# Patient Record
Sex: Female | Born: 1940 | Race: White | Hispanic: No | Marital: Single | Smoking: Never smoker
Health system: Southern US, Community
[De-identification: ages and names within clinical notes are randomized; demographics above are authoritative.]

## PROBLEM LIST (undated history)

## (undated) DIAGNOSIS — E669 Obesity, unspecified: Secondary | ICD-10-CM

## (undated) DIAGNOSIS — E079 Disorder of thyroid, unspecified: Secondary | ICD-10-CM

## (undated) DIAGNOSIS — I480 Paroxysmal atrial fibrillation: Secondary | ICD-10-CM

## (undated) DIAGNOSIS — K3189 Other diseases of stomach and duodenum: Secondary | ICD-10-CM

## (undated) DIAGNOSIS — N390 Urinary tract infection, site not specified: Secondary | ICD-10-CM

## (undated) DIAGNOSIS — K649 Unspecified hemorrhoids: Secondary | ICD-10-CM

## (undated) DIAGNOSIS — K219 Gastro-esophageal reflux disease without esophagitis: Secondary | ICD-10-CM

## (undated) DIAGNOSIS — Z5189 Encounter for other specified aftercare: Secondary | ICD-10-CM

## (undated) DIAGNOSIS — I8500 Esophageal varices without bleeding: Secondary | ICD-10-CM

## (undated) DIAGNOSIS — B029 Zoster without complications: Secondary | ICD-10-CM

## (undated) DIAGNOSIS — K7460 Unspecified cirrhosis of liver: Secondary | ICD-10-CM

## (undated) DIAGNOSIS — Z86010 Personal history of colonic polyps: Secondary | ICD-10-CM

## (undated) DIAGNOSIS — I499 Cardiac arrhythmia, unspecified: Secondary | ICD-10-CM

## (undated) DIAGNOSIS — I10 Essential (primary) hypertension: Secondary | ICD-10-CM

## (undated) DIAGNOSIS — K5790 Diverticulosis of intestine, part unspecified, without perforation or abscess without bleeding: Secondary | ICD-10-CM

## (undated) DIAGNOSIS — D649 Anemia, unspecified: Secondary | ICD-10-CM

## (undated) DIAGNOSIS — IMO0002 Reserved for concepts with insufficient information to code with codable children: Secondary | ICD-10-CM

## (undated) DIAGNOSIS — E039 Hypothyroidism, unspecified: Secondary | ICD-10-CM

## (undated) DIAGNOSIS — K56600 Partial intestinal obstruction, unspecified as to cause: Secondary | ICD-10-CM

## (undated) DIAGNOSIS — Z87442 Personal history of urinary calculi: Secondary | ICD-10-CM

## (undated) DIAGNOSIS — K766 Portal hypertension: Secondary | ICD-10-CM

## (undated) HISTORY — PX: COLON SURGERY: SHX602

## (undated) HISTORY — DX: Portal hypertension: K76.6

## (undated) HISTORY — DX: Unspecified cirrhosis of liver: K74.60

## (undated) HISTORY — PX: COLONOSCOPY: SHX174

## (undated) HISTORY — PX: ESOPHAGOGASTRODUODENOSCOPY: SHX1529

## (undated) HISTORY — DX: Urinary tract infection, site not specified: N39.0

## (undated) HISTORY — PX: CYSTOSCOPY: SUR368

## (undated) HISTORY — DX: Partial intestinal obstruction, unspecified as to cause: K56.600

## (undated) HISTORY — DX: Esophageal varices without bleeding: I85.00

## (undated) HISTORY — DX: Essential (primary) hypertension: I10

## (undated) HISTORY — DX: Zoster without complications: B02.9

## (undated) HISTORY — DX: Encounter for other specified aftercare: Z51.89

## (undated) HISTORY — DX: Disorder of thyroid, unspecified: E07.9

## (undated) HISTORY — DX: Obesity, unspecified: E66.9

## (undated) HISTORY — DX: Other diseases of stomach and duodenum: K31.89

## (undated) HISTORY — DX: Diverticulosis of intestine, part unspecified, without perforation or abscess without bleeding: K57.90

## (undated) HISTORY — DX: Paroxysmal atrial fibrillation: I48.0

## (undated) HISTORY — DX: Personal history of colonic polyps: Z86.010

## (undated) HISTORY — DX: Reserved for concepts with insufficient information to code with codable children: IMO0002

## (undated) HISTORY — PX: ABDOMINAL HYSTERECTOMY: SHX81

## (undated) HISTORY — DX: Anemia, unspecified: D64.9

## (undated) HISTORY — DX: Unspecified hemorrhoids: K64.9

---

## 1989-06-09 HISTORY — PX: APPENDECTOMY: SHX54

## 1998-06-09 HISTORY — PX: CHOLECYSTECTOMY: SHX55

## 1998-08-09 ENCOUNTER — Ambulatory Visit (HOSPITAL_COMMUNITY): Admission: RE | Admit: 1998-08-09 | Discharge: 1998-08-09 | Payer: Self-pay | Admitting: *Deleted

## 1998-08-13 ENCOUNTER — Ambulatory Visit (HOSPITAL_COMMUNITY): Admission: RE | Admit: 1998-08-13 | Discharge: 1998-08-13 | Payer: Self-pay | Admitting: *Deleted

## 1998-08-13 ENCOUNTER — Encounter: Payer: Self-pay | Admitting: *Deleted

## 1999-05-10 ENCOUNTER — Encounter (HOSPITAL_BASED_OUTPATIENT_CLINIC_OR_DEPARTMENT_OTHER): Payer: Self-pay | Admitting: General Surgery

## 1999-05-13 ENCOUNTER — Encounter (HOSPITAL_BASED_OUTPATIENT_CLINIC_OR_DEPARTMENT_OTHER): Payer: Self-pay | Admitting: General Surgery

## 1999-05-13 ENCOUNTER — Encounter (INDEPENDENT_AMBULATORY_CARE_PROVIDER_SITE_OTHER): Payer: Self-pay | Admitting: Specialist

## 1999-05-14 ENCOUNTER — Inpatient Hospital Stay (HOSPITAL_COMMUNITY): Admission: RE | Admit: 1999-05-14 | Discharge: 1999-05-31 | Payer: Self-pay | Admitting: General Surgery

## 1999-05-14 ENCOUNTER — Encounter (HOSPITAL_BASED_OUTPATIENT_CLINIC_OR_DEPARTMENT_OTHER): Payer: Self-pay | Admitting: General Surgery

## 1999-05-15 ENCOUNTER — Encounter (HOSPITAL_BASED_OUTPATIENT_CLINIC_OR_DEPARTMENT_OTHER): Payer: Self-pay | Admitting: General Surgery

## 1999-05-16 ENCOUNTER — Encounter (HOSPITAL_BASED_OUTPATIENT_CLINIC_OR_DEPARTMENT_OTHER): Payer: Self-pay | Admitting: General Surgery

## 1999-05-20 ENCOUNTER — Encounter (HOSPITAL_BASED_OUTPATIENT_CLINIC_OR_DEPARTMENT_OTHER): Payer: Self-pay | Admitting: General Surgery

## 1999-05-21 ENCOUNTER — Encounter: Payer: Self-pay | Admitting: Critical Care Medicine

## 1999-05-22 ENCOUNTER — Encounter (HOSPITAL_BASED_OUTPATIENT_CLINIC_OR_DEPARTMENT_OTHER): Payer: Self-pay | Admitting: General Surgery

## 1999-05-23 ENCOUNTER — Encounter (HOSPITAL_BASED_OUTPATIENT_CLINIC_OR_DEPARTMENT_OTHER): Payer: Self-pay | Admitting: General Surgery

## 1999-05-24 ENCOUNTER — Encounter (HOSPITAL_BASED_OUTPATIENT_CLINIC_OR_DEPARTMENT_OTHER): Payer: Self-pay | Admitting: General Surgery

## 1999-05-25 ENCOUNTER — Encounter (HOSPITAL_BASED_OUTPATIENT_CLINIC_OR_DEPARTMENT_OTHER): Payer: Self-pay | Admitting: General Surgery

## 1999-05-26 ENCOUNTER — Encounter (HOSPITAL_BASED_OUTPATIENT_CLINIC_OR_DEPARTMENT_OTHER): Payer: Self-pay | Admitting: General Surgery

## 1999-05-27 ENCOUNTER — Encounter: Payer: Self-pay | Admitting: Thoracic Surgery

## 1999-05-27 ENCOUNTER — Encounter (HOSPITAL_BASED_OUTPATIENT_CLINIC_OR_DEPARTMENT_OTHER): Payer: Self-pay | Admitting: General Surgery

## 1999-05-30 ENCOUNTER — Encounter (HOSPITAL_BASED_OUTPATIENT_CLINIC_OR_DEPARTMENT_OTHER): Payer: Self-pay | Admitting: General Surgery

## 1999-05-31 ENCOUNTER — Inpatient Hospital Stay: Admission: RE | Admit: 1999-05-31 | Discharge: 1999-06-18 | Payer: Self-pay | Admitting: General Surgery

## 1999-06-06 ENCOUNTER — Encounter (HOSPITAL_BASED_OUTPATIENT_CLINIC_OR_DEPARTMENT_OTHER): Payer: Self-pay | Admitting: General Surgery

## 1999-06-08 ENCOUNTER — Encounter (HOSPITAL_BASED_OUTPATIENT_CLINIC_OR_DEPARTMENT_OTHER): Payer: Self-pay | Admitting: General Surgery

## 1999-06-10 ENCOUNTER — Encounter (HOSPITAL_BASED_OUTPATIENT_CLINIC_OR_DEPARTMENT_OTHER): Payer: Self-pay | Admitting: General Surgery

## 1999-06-13 ENCOUNTER — Encounter (HOSPITAL_BASED_OUTPATIENT_CLINIC_OR_DEPARTMENT_OTHER): Payer: Self-pay | Admitting: General Surgery

## 1999-06-17 ENCOUNTER — Encounter (HOSPITAL_BASED_OUTPATIENT_CLINIC_OR_DEPARTMENT_OTHER): Payer: Self-pay | Admitting: General Surgery

## 1999-06-18 ENCOUNTER — Inpatient Hospital Stay (HOSPITAL_COMMUNITY): Admission: EM | Admit: 1999-06-18 | Discharge: 1999-07-26 | Payer: Self-pay | Admitting: General Surgery

## 1999-06-19 ENCOUNTER — Encounter: Payer: Self-pay | Admitting: Nephrology

## 1999-06-19 ENCOUNTER — Encounter (HOSPITAL_BASED_OUTPATIENT_CLINIC_OR_DEPARTMENT_OTHER): Payer: Self-pay | Admitting: General Surgery

## 1999-06-22 ENCOUNTER — Encounter (HOSPITAL_BASED_OUTPATIENT_CLINIC_OR_DEPARTMENT_OTHER): Payer: Self-pay | Admitting: General Surgery

## 1999-06-30 ENCOUNTER — Encounter (HOSPITAL_BASED_OUTPATIENT_CLINIC_OR_DEPARTMENT_OTHER): Payer: Self-pay | Admitting: General Surgery

## 1999-07-01 ENCOUNTER — Encounter (HOSPITAL_BASED_OUTPATIENT_CLINIC_OR_DEPARTMENT_OTHER): Payer: Self-pay | Admitting: General Surgery

## 1999-07-04 ENCOUNTER — Encounter (HOSPITAL_BASED_OUTPATIENT_CLINIC_OR_DEPARTMENT_OTHER): Payer: Self-pay | Admitting: General Surgery

## 1999-07-06 ENCOUNTER — Encounter (HOSPITAL_BASED_OUTPATIENT_CLINIC_OR_DEPARTMENT_OTHER): Payer: Self-pay | Admitting: General Surgery

## 1999-07-10 ENCOUNTER — Encounter: Payer: Self-pay | Admitting: Nephrology

## 1999-07-23 ENCOUNTER — Encounter (HOSPITAL_BASED_OUTPATIENT_CLINIC_OR_DEPARTMENT_OTHER): Payer: Self-pay | Admitting: General Surgery

## 1999-07-26 ENCOUNTER — Inpatient Hospital Stay: Admission: RE | Admit: 1999-07-26 | Discharge: 1999-08-09 | Payer: Self-pay | Admitting: General Surgery

## 1999-08-02 ENCOUNTER — Ambulatory Visit (HOSPITAL_COMMUNITY): Admission: RE | Admit: 1999-08-02 | Discharge: 1999-08-02 | Payer: Self-pay | Admitting: General Surgery

## 1999-08-02 ENCOUNTER — Encounter (HOSPITAL_BASED_OUTPATIENT_CLINIC_OR_DEPARTMENT_OTHER): Payer: Self-pay | Admitting: General Surgery

## 1999-08-06 ENCOUNTER — Encounter (HOSPITAL_BASED_OUTPATIENT_CLINIC_OR_DEPARTMENT_OTHER): Payer: Self-pay | Admitting: General Surgery

## 1999-08-06 ENCOUNTER — Ambulatory Visit (HOSPITAL_COMMUNITY): Admission: RE | Admit: 1999-08-06 | Discharge: 1999-08-06 | Payer: Self-pay | Admitting: General Surgery

## 1999-08-07 ENCOUNTER — Encounter (HOSPITAL_BASED_OUTPATIENT_CLINIC_OR_DEPARTMENT_OTHER): Payer: Self-pay | Admitting: General Surgery

## 1999-08-07 ENCOUNTER — Ambulatory Visit (HOSPITAL_COMMUNITY): Admission: RE | Admit: 1999-08-07 | Discharge: 1999-08-07 | Payer: Self-pay | Admitting: General Surgery

## 1999-08-09 ENCOUNTER — Inpatient Hospital Stay (HOSPITAL_COMMUNITY): Admission: EM | Admit: 1999-08-09 | Discharge: 1999-08-13 | Payer: Self-pay | Admitting: Cardiovascular Disease

## 1999-08-13 ENCOUNTER — Inpatient Hospital Stay: Admission: RE | Admit: 1999-08-13 | Discharge: 1999-09-14 | Payer: Self-pay | Admitting: General Surgery

## 1999-08-15 ENCOUNTER — Ambulatory Visit (HOSPITAL_COMMUNITY): Admission: RE | Admit: 1999-08-15 | Discharge: 1999-08-15 | Payer: Self-pay | Admitting: General Surgery

## 1999-08-19 ENCOUNTER — Encounter (HOSPITAL_BASED_OUTPATIENT_CLINIC_OR_DEPARTMENT_OTHER): Payer: Self-pay | Admitting: General Surgery

## 1999-08-19 ENCOUNTER — Ambulatory Visit (HOSPITAL_COMMUNITY): Admission: RE | Admit: 1999-08-19 | Discharge: 1999-08-19 | Payer: Self-pay | Admitting: General Surgery

## 1999-08-20 ENCOUNTER — Encounter: Payer: Self-pay | Admitting: *Deleted

## 1999-08-20 ENCOUNTER — Ambulatory Visit (HOSPITAL_COMMUNITY): Admission: RE | Admit: 1999-08-20 | Discharge: 1999-08-20 | Payer: Self-pay | Admitting: General Surgery

## 1999-09-02 ENCOUNTER — Encounter (HOSPITAL_BASED_OUTPATIENT_CLINIC_OR_DEPARTMENT_OTHER): Payer: Self-pay | Admitting: General Surgery

## 1999-09-02 ENCOUNTER — Ambulatory Visit (HOSPITAL_COMMUNITY): Admission: RE | Admit: 1999-09-02 | Discharge: 1999-09-02 | Payer: Self-pay | Admitting: General Surgery

## 2000-11-16 ENCOUNTER — Ambulatory Visit (HOSPITAL_COMMUNITY): Admission: RE | Admit: 2000-11-16 | Discharge: 2000-11-16 | Payer: Self-pay | Admitting: Internal Medicine

## 2000-11-16 ENCOUNTER — Encounter: Payer: Self-pay | Admitting: Internal Medicine

## 2001-04-26 ENCOUNTER — Other Ambulatory Visit: Admission: RE | Admit: 2001-04-26 | Discharge: 2001-04-26 | Payer: Self-pay | Admitting: Obstetrics and Gynecology

## 2003-02-24 ENCOUNTER — Ambulatory Visit (HOSPITAL_BASED_OUTPATIENT_CLINIC_OR_DEPARTMENT_OTHER): Admission: RE | Admit: 2003-02-24 | Discharge: 2003-02-24 | Payer: Self-pay | Admitting: Urology

## 2003-02-24 ENCOUNTER — Ambulatory Visit (HOSPITAL_COMMUNITY): Admission: RE | Admit: 2003-02-24 | Discharge: 2003-02-24 | Payer: Self-pay | Admitting: Urology

## 2004-08-08 ENCOUNTER — Ambulatory Visit: Payer: Self-pay | Admitting: Internal Medicine

## 2004-08-12 ENCOUNTER — Ambulatory Visit (HOSPITAL_COMMUNITY): Admission: RE | Admit: 2004-08-12 | Discharge: 2004-08-12 | Payer: Self-pay | Admitting: Internal Medicine

## 2004-08-12 ENCOUNTER — Ambulatory Visit: Payer: Self-pay | Admitting: Internal Medicine

## 2004-09-16 ENCOUNTER — Ambulatory Visit: Payer: Self-pay | Admitting: Internal Medicine

## 2004-10-07 ENCOUNTER — Ambulatory Visit: Payer: Self-pay | Admitting: Internal Medicine

## 2004-10-14 ENCOUNTER — Ambulatory Visit: Payer: Self-pay | Admitting: Internal Medicine

## 2004-10-21 ENCOUNTER — Ambulatory Visit: Payer: Self-pay | Admitting: Internal Medicine

## 2006-06-26 ENCOUNTER — Other Ambulatory Visit: Admission: RE | Admit: 2006-06-26 | Discharge: 2006-06-26 | Payer: Self-pay | Admitting: Internal Medicine

## 2007-12-08 ENCOUNTER — Inpatient Hospital Stay (HOSPITAL_COMMUNITY): Admission: EM | Admit: 2007-12-08 | Discharge: 2007-12-13 | Payer: Self-pay | Admitting: Emergency Medicine

## 2007-12-09 ENCOUNTER — Encounter: Payer: Self-pay | Admitting: Internal Medicine

## 2007-12-13 ENCOUNTER — Encounter: Payer: Self-pay | Admitting: Gastroenterology

## 2007-12-15 ENCOUNTER — Ambulatory Visit: Payer: Self-pay | Admitting: Internal Medicine

## 2007-12-31 ENCOUNTER — Ambulatory Visit: Payer: Self-pay | Admitting: Internal Medicine

## 2007-12-31 DIAGNOSIS — K7460 Unspecified cirrhosis of liver: Secondary | ICD-10-CM | POA: Insufficient documentation

## 2007-12-31 DIAGNOSIS — Z8719 Personal history of other diseases of the digestive system: Secondary | ICD-10-CM

## 2007-12-31 DIAGNOSIS — K766 Portal hypertension: Secondary | ICD-10-CM | POA: Insufficient documentation

## 2008-01-12 ENCOUNTER — Ambulatory Visit: Payer: Self-pay | Admitting: Internal Medicine

## 2009-02-07 DEATH — deceased

## 2009-08-21 ENCOUNTER — Encounter: Payer: Self-pay | Admitting: Internal Medicine

## 2009-08-29 ENCOUNTER — Ambulatory Visit: Payer: Self-pay | Admitting: Internal Medicine

## 2009-08-29 ENCOUNTER — Ambulatory Visit (HOSPITAL_COMMUNITY): Admission: RE | Admit: 2009-08-29 | Discharge: 2009-08-29 | Payer: Self-pay | Admitting: Internal Medicine

## 2009-09-27 ENCOUNTER — Ambulatory Visit: Payer: Self-pay | Admitting: Internal Medicine

## 2009-09-27 DIAGNOSIS — E669 Obesity, unspecified: Secondary | ICD-10-CM | POA: Insufficient documentation

## 2009-10-01 ENCOUNTER — Ambulatory Visit (HOSPITAL_COMMUNITY): Admission: RE | Admit: 2009-10-01 | Discharge: 2009-10-01 | Payer: Self-pay | Admitting: Internal Medicine

## 2010-01-07 ENCOUNTER — Encounter (INDEPENDENT_AMBULATORY_CARE_PROVIDER_SITE_OTHER): Payer: Self-pay | Admitting: *Deleted

## 2010-02-07 ENCOUNTER — Encounter (INDEPENDENT_AMBULATORY_CARE_PROVIDER_SITE_OTHER): Payer: Self-pay | Admitting: *Deleted

## 2010-03-18 ENCOUNTER — Encounter (INDEPENDENT_AMBULATORY_CARE_PROVIDER_SITE_OTHER): Payer: Self-pay

## 2010-03-19 ENCOUNTER — Ambulatory Visit: Payer: Self-pay | Admitting: Internal Medicine

## 2010-04-02 ENCOUNTER — Ambulatory Visit: Payer: Self-pay | Admitting: Internal Medicine

## 2010-07-11 NOTE — Letter (Signed)
Summary: Pre Visit Letter Revised  [facility]  [address] [place], [place] [address]   Phone: [phone]  Fax: [phone]        02/07/2010 MRN: [id number] [facility] [address] [place], [place]  [address]             Procedure Date:  04/02/2010   Welcome to the Gastroenterology Division at [facility].    You are scheduled to see a nurse for your pre-procedure visit on 03/19/2010 at 1:00PM on the 3rd floor at [facility], [address]  We ask that you try to arrive at our office 15 minutes prior to your appointment time to allow for check-in.  Please take a minute to review the attached form.  If you answer "Yes" to one or more of the questions on the first page, we ask that you call the person listed at your earliest opportunity.  If you answer "No" to all of the questions, please complete the rest of the form and bring it to your appointment.    Your nurse visit will consist of discussing your medical and surgical history, your immediate family medical history, and your medications.   If you are unable to list all of your medications on the form, please bring the medication bottles to your appointment and we will list them.  We will need to be aware of both prescribed and over the counter drugs.  We will need to know exact dosage information as well.    Please be prepared to read and sign documents such as consent forms, a financial agreement, and acknowledgement forms.  If necessary, and with your consent, a friend or relative is welcome to sit-in on the nurse visit with you.  Please bring your insurance card so that we may make a copy of it.  If your insurance requires a referral to see a specialist, please bring your referral form from your primary care physician.  No co-pay is required for this nurse visit.     If you cannot keep your appointment, please call [phone] to cancel or reschedule prior to your appointment date.  This  allows [place] the opportunity to schedule an appointment for another patient in need of care.    Thank you for choosing [facility] for your medical needs.  We appreciate the opportunity to care for you.  Please visit [place] at our website  to learn more about our practice.  Sincerely, The Gastroenterology Division

## 2010-07-11 NOTE — Consult Note (Signed)
Summary: GI Consult/[facility]  GI Consult/[facility]   Imported By: Sherian Rein 10/15/2009 07:40:08  _____________________________________________________________________  External Attachment:    Type:   Image     Comment:   External Document

## 2010-07-11 NOTE — Progress Notes (Signed)
Summary: [facility], [facility]   Imported By: Sherian Rein 10/15/2009 07:42:12  _____________________________________________________________________  External Attachment:    Type:   Image     Comment:   External Document

## 2010-07-11 NOTE — Procedures (Signed)
Summary: Upper Endoscopy  Patient: Brittney Valentine Note: All result statuses are Final unless otherwise noted.  Tests: (1) Upper Endoscopy (EGD)   EGD Upper Endoscopy       DONE (C)     [facility]     [address]     [place], [place]  [address]           ENDOSCOPY PROCEDURE REPORT           PATIENT:  Brittney Valentine, Brittney Valentine  MR#:  [id number]     BIRTHDATE:  02-04-1941, 68 yrs. old  GENDER:  female           ENDOSCOPIST:  Iva Boop, MD, [facility]           PROCEDURE DATE:  04/02/2010     PROCEDURE:  EGD, diagnostic [phone]     ASA CLASS:  Class III     INDICATIONS:  cirrhosis, Known esophageal varices, for     surveillance exam.           MEDICATIONS:   Fentanyl 50 mcg IV, Versed 5 mg IV     TOPICAL ANESTHETIC:  Exactacain Spray           DESCRIPTION OF PROCEDURE:   After the risks benefits and     alternatives of the procedure were thoroughly explained, informed     consent was obtained.  The LB GIF-H180 [id number] endoscope was     introduced through the mouth and advanced to the second portion of     the duodenum, without limitations.  The instrument was slowly     withdrawn as the mucosa was fully examined.     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 Into distal     esophagus, 30-40 cm. 3 columns. No bleeding stigmata.  Portal     Hypertensive gastropathy in the total stomach. Some friable mucosa     with contact bleeding also.  Otherwise the examination was normal.     Retroflexed views revealed Retroflexion exam demonstrated findings     as previously described.    The scope was then withdrawn from the     patient and the procedure completed.           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     In 2 years for  EGD           Iva Boop, MD, [facility]           CC:  Marisue Brooklyn, DO     The Patient           n.     REVISED:  04/02/2010 01:11 PM     eSIGNED:   Iva Boop at 04/02/2010 01:11 PM           Signe Colt, [id number]  Note: An exclamation mark (!) indicates a result that was not dispersed into the flowsheet. Document Creation Date: 04/02/2010 1:11 PM _______________________________________________________________________  (1) Order result status: Final Collection or observation date-time: 04/02/2010 12:59 Requested date-time:  Receipt date-time:  Reported date-time:  Referring Physician:   Ordering Physician: Stan Head [id number]) Specimen Source:  Source: Launa Grill Order Number: [id number] Lab site:   Appended Document: Upper Endoscopy    Clinical Lists Changes  Observations: Added new observation of EGD DUE: 03/2012 (04/02/2010 14:53)

## 2010-07-11 NOTE — Assessment & Plan Note (Signed)
Summary: Cirrhosis follow-up   History of Present Illness Visit Type: Follow-up Visit Primary GI MD: Stan Head MD [facility] Primary Provider: Marisue Brooklyn, M.D. Requesting Provider: n/a Chief Complaint: f/u SBO and cirrhosis. Denies any GI sx at this time. 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 Sghe says her sister's liver enzymes are up and she is getting  a GFI consult.. Had to see Dr. Elisabeth Most re: allergies yesterday. Missed 8/10 appointment and is now following up. Labs with Dr. Elisabeth Most last month "ok". Otherwise ok.   GI Review of Systems      Denies abdominal pain, acid reflux, belching, bloating, chest pain, dysphagia with liquids, dysphagia with solids, heartburn, loss of appetite, nausea, vomiting, vomiting blood, weight loss, and  weight gain.      Reports liver problems.     Denies anal fissure, black tarry stools, change in bowel habit, constipation, diarrhea, diverticulosis, fecal incontinence, heme positive stool, hemorrhoids, irritable bowel syndrome, jaundice, light color stool, rectal bleeding, and  rectal pain.    Current Medications (verified): 1)  Levothyroxine Sodium 112 Mcg  Tabs (Levothyroxine Sodium) .Marland Kitchen.. 1 Tablet By Mouth Once Daily 2)  Flecainide Acetate 100 Mg  Tabs (Flecainide Acetate) .Marland Kitchen.. 1 Tablet By Mouth Two Times A Day 3)  Ativan 0.5 Mg  Tabs (Lorazepam) .Marland Kitchen.. 1-2 Every 6 Hrs As Needed Anxiety 4)  Vitamin D 1000 Unit  Tabs (Cholecalciferol) .... 2 Tablets By Mouth Once Daily  Allergies (verified): 1)  ! Sulfa  Past History:  Past Medical History: Reviewed history from 12/31/2007 and no changes required. Cirrhosis cryptogenic SVT PAF Ureterolithiasis Arrhythmia Atrial Fibrillation Hypothyroidism Small bowel obstruction  Past Surgical History: Reviewed history from 12/31/2007 and no changes required. Appendectomy Cholecystectomy complicated by small bowel perf Small bowel  resection Hysterectomy  Family History: Reviewed history from 12/31/2007 and no changes required. No FH of Colon Cancer:  Social History: Reviewed history from 12/31/2007 and no changes required. Patient has never smoked.  Alcohol Use - no Daily Caffeine Use 1 cup per week  Vital Signs:  Patient profile:   70 year old female Height:      65 inches Weight:      225.50 pounds BMI:     37.66 Pulse rate:   70 / minute Pulse rhythm:   regular BP sitting:   144 / 72  (right arm) Cuff size:   regular  Vitals Entered By: Christie Nottingham CMA Duncan Dull) (September 27, 2009 11:31 AM)  Physical Exam  General:  obese.   Eyes:   no icterus. Lungs:  Clear throughout to auscultation. Heart:  Regular rate and rhythm; no murmurs, rubs,  or bruits. Abdomen:  surgical scars and deformity obese, soft and non-tender BS+ no HSM/mass Extremities:  no edema Neurologic:  Alert and  oriented x4;   Skin:  no stigmata of chronic liver disease Psych:  Alert and cooperative. Normal mood and affect.   Impression & Recommendations:  Problem # 1:  CIRRHOSIS (ICD-571.5) Assessment Unchanged Cryptogenic, macronodular liver seen at laparoscopic cholecystectomy 2000, CT scanning has indicated cirrhotic liver as well. Serologic workup 2006 with negative AMA, negative anti-smooth muscle antibody, negative hepatitis B surface antigen, negative hepatitis C antibody, negative ANA, normal alpha one antitrypsin level. July 09 CT scanning demonstrated varices, with portal hypertension, and cirrhotic-appearing liver. No ascites noted. alpha-fetoprotein level normal July 09 ? ferritin - will make sure she had one with paper chart review She has had Hep A and B vaccines (?  if flowed - will ensure) review recent labs from Dr. Elisabeth Most she gets annual influenza vaccine and has had a pneumovax.  Overall stable and compensated  Orders: Ultrasound Abdomen (UAS) Hepatocellular carcinoma screen  Problem # 2:  PORTAL  HYPERTENSION (ICD-572.3) Assessment: Unchanged recall egd 8/11 to reasess small varices and portal gastropathy  Problem # 3:  OBESITY (ICD-278.00) Assessment: Unchanged Rejoining curves we discussed need for weight loss some instructions provided  Patient Instructions: 1)  You need to lose weight. Start by limiting portions, amounts. Avoid eating when not hungry. Limit desserts.Look for high fructose corn syrup on food labels and if in first 3 ingredients, avoid that food. Also try to eat whole grains, avoid "white foods" (e.g. white rice, white bread).   2)  Ultrasound of liver will be scheduled, Arrive at [facility] at 8:45am on 10/01/2009. 3)  You will need a repeat upper endoscopy in 01/2010 and should hear from [place] about that. 4)  Copy sent to : Marisue Brooklyn, DO 5)  The medication list was reviewed and reconciled.  All changed / newly prescribed medications were explained.  A complete medication list was provided to the patient / caregiver.  Appended Document: f.u...em   Impression & Recommendations:  Problem # 1:  PORTAL HYPERTENSION (ICD-572.3) Assessment Comment Only PLTS and WBC low consistent with hypersplenism   Appended Document: Cirrhosis follow-up ferritin 105 in 2006

## 2010-07-11 NOTE — Letter (Signed)
Summary: Endoscopy Letter  [facility]  [address] [place], [place] [address]   Phone: [phone]  Fax: [phone]      January 07, 2010 MRN: [id number]   Brittney Valentine [address] [place], [place]  [address]   Dear Ms. Heide,   According to your medical record, it is time for you to schedule an Endoscopy. Endoscopic screening is recommended for patients with certain upper digestive tract conditions because of associated increased risk for cancers of the upper digestive system.  This letter has been generated based on the recommendations made at the time of your prior procedure. If you feel that in your particular situation this may no longer apply, please contact our office.  Please call our office at [phone]) to schedule this appointment or to update your records at your earliest convenience.  Thank you for cooperating with [place] to provide you with the very best care possible.   Sincerely,    Iva Boop, M.D.  [facility]astro[facility] [phone]

## 2010-07-11 NOTE — Letter (Signed)
Summary: EGD Instructions  [facility]  [address] [place], [place] [address]   Phone: [phone]  Fax: [phone]       Brittney Valentine    [birth year]    MRN: [id number]       Procedure Day /Date: Tuesday 04-02-10     Arrival Time:  10:30 a.m.     Procedure Time: 11:30 a.m.     Location of Procedure:                     [facility] (4th Floor)    PREPARATION FOR ENDOSCOPY   On Tuesday 04-02-10, THE DAY OF THE PROCEDURE:  1.   No solid foods, milk or milk products are allowed after midnight the night before your procedure.  2.   Do not drink anything colored red or purple.  Avoid juices with pulp.  No orange juice.  3.  You may drink clear liquids until 9:30 a.m. , which is 2 hours before your procedure.                                                                                                CLEAR LIQUIDS INCLUDE: Water Jello Ice Popsicles Tea (sugar ok, no milk/cream) Powdered fruit flavored drinks Coffee (sugar ok, no milk/cream) Gatorade Juice: apple, white grape, white cranberry  Lemonade Clear bullion, consomm, broth Carbonated beverages (any kind) Strained chicken noodle soup Hard Candy   MEDICATION INSTRUCTIONS  Unless otherwise instructed, you should take regular prescription medications with a small sip of water as early as possible the morning of your procedure.           OTHER INSTRUCTIONS  You will need a responsible adult at least 68 years of age to accompany you and drive you home.   This person must remain in the waiting room during your procedure.  Wear loose fitting clothing that is easily removed.  Leave jewelry and other valuables at home.  However, you may wish to bring a book to read or an iPod/MP3 player to listen to music as you wait for your procedure to start.  Remove all body piercing jewelry and leave at home.  Total time from sign-in until discharge is approximately 2-3 hours.  You  should go home directly after your procedure and rest.  You can resume normal activities the day after your procedure.  The day of your procedure you should not:   Drive   Make legal decisions   Operate machinery   Drink alcohol   Return to work  You will receive specific instructions about eating, activities and medications before you leave.    The above instructions have been reviewed and explained to me by   Ulis Rias RN  March 19, 2010 1:00 PM     I fully understand and can verbalize these instructions _____________________________ Date _________

## 2010-07-11 NOTE — Miscellaneous (Signed)
Summary: Lec previsit  Clinical Lists Changes  Observations: Added new observation of ALLERGY REV: Done (03/19/2010 12:35)

## 2010-10-22 NOTE — H&P (Signed)
Brittney Valentine, Brittney Valentine               ACCOUNT NO.:  [id number]   MEDICAL RECORD NO.:  [id number]          PATIENT TYPE:  INP   LOCATION:  1426                         FACILITY:  [facility]   PHYSICIAN:  Hedwig Morton. Juanda Chance, MD     DATE OF BIRTH:  May 05, 1941   DATE OF ADMISSION:  12/08/2007  DATE OF DISCHARGE:                              HISTORY & PHYSICAL   PROBLEM:  Small bowel obstruction.   HISTORY:  Brittney Valentine a pleasant, 70 year old white female known to Dr.  Leone Valentine from prior workup done in 2006.  She is a primary patient of Dr.  Marisue Valentine.  She has apparently been given a diagnosis of cryptogenic  cirrhosis,  has history of SVT, PAF and is status post cholecystectomy  in December of 2000 per Dr. Lurene Valentine.  This was complicated by a small  bowel perforation, which required repeat surgery and small bowel  resection.  She did develop an intraabdominal abscess, which then  required drainage.  She also has history of ureterolithiasis and is  status post appendectomy abd hysterectomy as well.  Evaluation per Dr.  Leone Valentine in 2006 with endoscopy showed grade tiny esophageal varices and  gastritis.  Colonoscopy done at that same time negative with the  exception of diverticular disease and external hemorrhoids.  Review of  her office chart shows that she did undergo workup with serologic  markers because of the finding of the tiny varices and evidence for  portal hypertension.  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   Patient also had small bowel follow through done in June of 2006, which  was negative.  We have not seen her since that time and now presents  with 24 hour history of crampy abdominal pain and distension, nausea,  vomiting and dry heaves.  She said she had onset of her symptoms about 4  p.m. the day prior to admission, with gradual progression.  She had been  feeling well recently with no antecedent GI symptoms,  having had normal  bowel movements, etc.  She did have a bowel movement on the morning of  June 30, has not passed any flatus or bowel movement since.  She has not  had any fever or chills.  She was seen in Brittney Valentine office today,  referred to the emergency room for abdominal films, which are consistent  with a partial small bowel obstruction and she is admitted at this time  for supportive management.   CURRENT MEDICATIONS:  Levothyroxine.  She believes she takes 0.025 mg  daily and flecainide 100 mg twice daily.   ALLERGIES:  Brittney Valentine.   PAST HISTORY:  As outlined above.   SOCIAL HISTORY:  The patient lives alone.  She does not have any  children, is retired.  No tobacco and no ETOH.   FAMILY HISTORY:  Negative for GI disease.  No other familial diseases  that she is aware of.   REVIEW OF SYSTEMS:  HEENT:  Pertinent for season allergy  symptoms.  CARDIOVASCULAR:  Rare palpitations.  PULMONARY:  Denies any cough,  shortness of breath or sputum production.  GI:  As outlined above.  GU:  Denies any dysuria, urgency or frequency.  MUSCULOSKELETAL:  Negative.  NEURO/PSYCH:  Negative.  All other review of systems negative.   KUB on admission July 1:  Prominent mid and lower abdominal small bowel  loops.  Rule out small bowel obstruction.  Labs were pending on  admission.   EXAMINATION:  Well developed white female in no acute distress.  Pleas  nat, alert and oriented x3.  Temperature 98, blood pressure 147/70,  pulse in the 70s, respirations 16.  HEENT:  Nontraumatic, normocephalic.  EOMI, PERRLA.  Sclerae anicteric.  NECK:  Supple.  No adenopathy, no JVD.  CARDIOVASCULAR:  Regular rate and rhythm with S1 and S2.  No murmur, rub  or gallop.  LUNGS:  Clear to auscultation and percussion.  ABDOMEN:  Large, soft, moderately distended in lower abdomen, left  greater than right.  Bowel sounds are present but hypoactive.  She is   diffusely tender across the lower abdomen.  She has multiple incisional  scars and a deep defect from her prior surgery and abscess.  No palpable  hepatosplenomegaly.  No fluid wave.  RECTAL:  Exam is not done at this time.  EXTREMITIES:  No clubbing, cyanosis or edema.  NEURO:  The patient is alert and oriented x3.  An exam is grossly  nonfocal.   IMPRESSION:  1. [age]-year-old white female with partial small bowel      obstruction likely secondary to adhesions.  2. Status post multiple abdominal surgeries with appendectomy,      hysterectomy, cholecystectomy, which was complicated by small bowel      perforation, leak and then abscess requiring small bowel resection.  3. History of supraventricular tachycardia, atrial fibrillation,      status post remote ablation.  4. Hypothyroidism.  5. Question cryptogenic cirrhosis.   PLAN:  Patient is admitted to the service of Dr. Lina Valentine for IV  fluid hydration, bowel rest.  If she has further vomiting, we will place  NG tube.  IV pain control, anti-emetics, proton pump inhibitor, and we  will schedule for CT of the abdomen and pelvis in a.m., both to evaluate  the partial bowel obstruction and to reassess status of hepatocellular  disease.  For details, please see the orders.      Brittney Bearden, PA-C      Brittney M. Juanda Chance, MD  Electronically Signed    AE/MEDQ  D:  12/09/2007  T:  12/09/2007  Job:  [id number]   cc:   Brittney Valentine, D.O.  Fax: [phone]

## 2010-10-22 NOTE — Discharge Summary (Signed)
Brittney Valentine, KUNKA               ACCOUNT NO.:  [id number]   MEDICAL RECORD NO.:  [id number]          PATIENT TYPE:  INP   LOCATION:  1426                         FACILITY:  [facility]   PHYSICIAN:  Malcolm T. Russella Dar, MD, FACGDATE OF BIRTH:  1940/08/19   DATE OF ADMISSION:  12/08/2007  DATE OF DISCHARGE:  12/13/2007                               DISCHARGE SUMMARY   ADMISSION DIAGNOSES:  28. A 70 year old white female with partial small-bowel obstruction      likely secondary to adhesions.  2. Status post multiple abdominal surgeries including appendectomy,      hysterectomy, cholecystectomy, which was complicated by small bowel      perforation, leak and subsequent abscess requiring small bowel      resection.  3. History of supraventricular tachycardia, atrial fibrillation and      remote ablation.  4. Hypothyroidism.  5. Question cryptogenic cirrhosis.   DISCHARGE DIAGNOSES:  1. Resolved partial small-bowel obstruction felt secondary to      adhesions.  2. Cryptogenic cirrhosis, question nonalcoholic steatohepatitis  3. A 70 year old white female with partial small-bowel obstruction      likely secondary to adhesions.  4. Status post multiple abdominal surgeries including appendectomy,      hysterectomy, cholecystectomy, which was complicated by small bowel      perforation, leak and subsequent abscess requiring small bowel      resection.  5. History of supraventricular tachycardia, atrial fibrillation and      remote ablation.  6. Hypothyroidism.  7. Question cryptogenic cirrhosis.   CONSULTATIONS:  None.   PROCEDURES:  1. Plain abdominal films.  2. CT scan of the abdomen and pelvis.   BRIEF HISTORY:  This is a pleasant 70 year old white female known to Dr.  Leone Payor from previous workup in 2006.  She is a primary patient of Dr.  Marisue Brooklyn.  She had apparently been given a diagnosis previously of  cryptogenic cirrhosis which was compensated.  She has had previous  small  bowel obstruction, has had multiple abdominal surgeries as outlined  above.  She had a cholecystectomy in 2000 which was complicated by a  small bowel perforation which then required repeat surgery, small bowel  resection and management of an intra-abdominal abscess.  She was seen by  Dr. Leone Payor in 2006, had an EGD which showed tiny esophageal varices and  gastritis.  Colonoscopy was negative with the exception of some  diverticular disease and external hemorrhoids.  Previous workup per Dr.  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  At this time, she presents with 24-hour  history of crampy abdominal pain, distention, nausea, vomiting and dry  heaves with onset of her symptoms about 4:00 p.m. the day prior to  admission with gradual progression.  She had not had any recent  symptomatology and had been feeling fine.  She was seen by Dr. Marisue Brooklyn in her office who then referred her to the emergency room for  GI evaluation.  She is seen and evaluated.  KUB is consistent with a  partial obstruction and she is admitted at this time for supportive  management.   LABORATORY STUDIES:  On admission, December 08, 2007, shows wbc of 7.4,  hemoglobin 14, hematocrit of 40.9, platelets 97.  Protime 15.2, INR of  1.2.  Electrolytes within normal limits.  Glucose was 117, BUN 14,  creatinine 0.79.  Total bilirubin of 1.6, alk phos of 71, SGOT of 56,  SGPT 38.  Ammonia level of 28.  Lipase 24.  UA showed 3-6 wbc, otherwise  negative.  Urine culture 40,000 colonies, suggested recollection.  Follow-up labs on December 13, 2007, wbc of 3.5, hemoglobin 13.4, hematocrit  of 39.3, platelets 95.  Electrolytes within normal limits.  Venous  ammonia of 29.  Alpha-fetoprotein of 2.   X-RAY STUDIES:  Plain abdominal films done on December 08, 2007, showed  mildly prominent mid and lower abdominal small-bowel loops concerning  for small-bowel obstruction.   CT scan of the abdomen and pelvis on December 09, 2007, shows a dropped gallstone in the right upper quadrant,  paraesophageal varices, spleen at upper limits of normal at 13 cm,  splenorenal collaterals were noted.  Liver surface contour, nodular.  Mild prominence of the ampulla.  No ascites.  Numerous mid and distal  small-bowel loops borderline dilated at 3 cm, multisegmental wall  thickening, especially adjacent to the anterior abdominal wall in the  infraumbilical region.  Follow-up abdominal films on December 10, 2007, no  evidence of small bowel obstruction.   HOSPITAL COURSE:  The patient was admitted to the service of Dr. Lina Sar who was covering the hospital.  She was initially kept n.p.o.,  given IV antiemetics, IV fluids and analgesics for pain control.  She  did not require NG tube placement, did not have any of vomiting after  admission.  Plain abdominal films were consistent with a partial small  bowel obstruction.  We proceeded with CT scan of the abdomen and pelvis  for further evaluation and also to reevaluate her liver.  She does have  evidence of cirrhosis and portal hypertension.  She had evidence of  adhesions in the pelvis.  Fortunately, she had a benign course.  Her  symptoms improved.  She started passing flatus.  We gradually advanced  her diet and by December 13, 2007, she was feeling well with no complaints  of abdominal pain, had eaten a solid diet for 24 hours, was having bowel  movements and was felt stable for discharge to home.  She was to follow  up with Dr. Leone Payor in the office on December 31, 2007, to call for any  problems in the interim.  She was to maintain a soft low-residue diet.   MEDICATIONS:  Flecainide and levothyroxine as at previous dosages.  I  believe her flecainide was 100 mg b.i.d. and levothyroxine 0.025.   FOLLOW UP:  She will follow up with Dr. Leone Payor to discuss ongoing  management of her cirrhosis.      Brittney Esterwood, PA-C       Malcolm T. Russella Dar, MD, [facility]  Electronically Signed    AE/MEDQ  D:  12/31/2007  T:  12/31/2007  Job:  [id number]   cc:   Lovenia Kim, D.O.  Fax: [phone]

## 2010-10-25 NOTE — Op Note (Signed)
[place]. [facility]  Patient:    Brittney Valentine                       MRN: [id number] Proc. Date: 05/15/99 Adm. Date:  [id number] Attending:  Sonda Primes CC:         Mardene Celeste. Lurene Shadow, M.D. (2 copies)                           Operative Report  PREOPERATIVE DIAGNOSIS:  Peritonitis probably secondary to bowel injury, status  post laparoscopic cholecystectomy.  POSTOPERATIVE DIAGNOSIS:  Peritonitis secondary to a small bowel perforation, status post laparoscopic cholecystectomy.  OPERATION:  Exploratory laparotomy, small bowel resection with anastomosis and peritoneal lavage.  SURGEON:  Mardene Celeste. Lurene Shadow, M.D.  ASSISTANT:  Marnee Spring. Wiliam Ke, M.D.  ANESTHESIA:  General.  INDICATIONS:  This patient is a 70 year old lady, who was admitted two days ago for laparoscopic cholecystectomy.  She had had previous abdominal surgery in the past, but she had what appeared to be routine laparoscopic cholecystectomy except for  lysis of some adhesion in and around the region of the umbilicus.  On postoperative course she did initially well, but then started complaining of increasing abdominal pain and nausea with vomiting.  On evaluation she had a CT scan which showed a rather diffuse peritonitis.  However, there was no free air.  She is returned now to the operating room for exploratory laparotomy and the possibility that she may have had a small bowel injury.  DESCRIPTION OF PROCEDURE:  Following the induction of anesthesia, with the patient was positioned supinely, and abdomen was routinely prepped and draped to be included in a sterile operative field.  Exploratory laparotomy carried out through a midline incision.  Upon entry into the abdomen a large amount of intestinal sulcus could be found within the peritoneum and this was sucked out.  There was a region of small bowel mucosa that was exposed and this area was just subjacent o the region  of the umbilicus.  The small bowel had multiple adhesions of the abdomen and were lysed and the abdomen was thoroughly irrigated with multiple aliquots f saline.  The region of injured bowel was resected between GIA staplers and a functional end-to-end anastomosis carried out with a GIA stapler and with a TA-60 stapling device to produce a open anastomosis between the loops of the bowel. he mesentery was closed with interrupted 3-0 silk sutures.  The lumen was tested and noted to be widely patent.  We spent quite a bit of time washing the abdomen with multiple aliquots of normal saline so as to reduce any bacterial count and to reduce any chemical peritonitis from the intestinal sulcus.  This having been accomplished, sponge, instruments, and sharp counts were verified.  The midline  wound was closed with a running suture of #1 Novofil.  The subcutaneous tissue as irrigated and packed open with saline gauge.  A sterile dressing was then applied. The anesthetic reversed and the patient removed from the operating room to the recovery room in stable condition, having tolerated this procedure well. DD:  05/15/99 TD:  05/17/99 Job: [id number]

## 2010-10-25 NOTE — H&P (Signed)
[place]. [facility]  Patient:    Brittney Valentine, Brittney Valentine                      MRN: [id number] Adm. Date:  [id number] Disc. Date: [id number] Attending:  Sonda Primes CC:         Two copies to Dr. Lurene Shadow                         History and Physical  PROBLEM:  Status post cholecystectomy with small bowel injury and associated intra-abdominal abscesses.  She has been treated at the acute hospital for recurrent intra-abdominal abscesses and is being returned for deconditioning.  PAST MEDICAL HISTORY:  Please refer to previous history and physical examinations on this admission.  REVIEW OF SYSTEMS:  Please refer to previous admission notes on this admission.  PHYSICAL EXAMINATION:  VITAL SIGNS:  Stable.  GENERAL:  There is no acute distress.  HEENT:  Head is normocephalic.  Pupils equal and reactive.  Oropharynx is benign.  NECK:  No thyromegaly or adenopathy.  CHEST:  Clear to auscultation.  HEART:  Regular rate and rhythm without murmurs, rubs or gallops.  ABDOMEN:  There is an open wound with drainage from the midline.  The abdomen is otherwise soft and bowel sounds are active.  The patient is tolerating a diet.  EXTREMITIES:  Show no clubbing, cyanosis or edema.  There is no calf tenderness.  NEUROLOGIC:  The patient can move all four extremities without difficulty and has no sensory deficits on gross testing.  ASSESSMENT:  Deconditioning following treatment for intra-abdominal abscesses.  PLAN:  Rehabilitation and conditioning. DD:  01/13/00 TD:  01/13/00 Job: [id number]

## 2010-10-25 NOTE — Discharge Summary (Signed)
[place]. [facility]  Patient:    Brittney Valentine, Brittney Valentine                      MRN: [id number] Adm. Date:  [id number] Disc. Date: [id number] Attending:  Sonda Primes CC:         Mardene Celeste. Lurene Shadow, M.D. (2 copies)                           Discharge Summary  ADMISSION DIAGNOSES: 1. Suppurativa peritonitis, status post major intra-abdominal abscess. 2. Hypertension. 3. History of paroxysmal atrial fibrillation. 4. Depressive disorder.  DISCHARGE DIAGNOSES: 1. Suppurativa peritonitis, status post major intra-abdominal abscess. 2. Hypertension. 3. History of paroxysmal atrial fibrillation. 4. Depressive disorder.  HISTORY OF PRESENT ILLNESS AND HOSPITAL COURSE:  Ms. Ronk is admitted for continued rehabilitative treatment following her complicated small-bowel perforation with intra-abdominal abscesses.  She is feeling well and continued to feel well, with her wounds closing slowly but definitively.  However, she has been eating very poorly, requiring continued TNA.  She is still nauseated. She continued to be anorexic.  Wound cultures continued to show some evidence of MRSA.  She had been seen and started on Celexa for treatment of her depression.  On August 02, 1999, there was some noted increased drainage from her flank wound, and repeat CT scan showed a 2.4 cm collection at the base of the flank wound which I was able to reach with a probing finger and strain.  She, however, continued to have low-grade temperatures, with temperature up to 99 on August 04, 1999.  She began feeling better and did start eating some food.  Repeat CT scan did show residual small abscess above the Gerotas fascia, and she underwent a CT-guided aspiration off the abscesses.  Again, this continued to grow some methicillin-resistant Staphylococcus aureus.  On August 08, 1999, she again developed paroxysmal atrial fibrillation which converted to a sinus rhythm with a first  degree AV block.  She was again seen in consultation by cardiology for readjustment in the dosing of her ______ and with good control of her heart rate.  The patient was then discharged from the [facility] on August 09, 1999. DD:  12/08/99 TD:  12/08/99 Job: [id number]

## 2010-10-25 NOTE — Op Note (Signed)
[place]. [facility]  Patient:    Brittney Valentine, Brittney Valentine                      MRN: [id number] Proc. Date: 06/24/99 Adm. Date:  [id number] Disc. Date: [id number] Attending:  Sonda Primes CC:         Mardene Celeste. Lurene Shadow, M.D. (2)                           Operative Report  PREOPERATIVE DIAGNOSIS:  Status post drainage of retroperitoneal intra-abdominal abscess.  POSTOPERATIVE DIAGNOSIS:  Status post drainage of retroperitoneal intra-abdominal abscess.  OPERATION PERFORMED:  Dressing change under anesthesia.  SURGEON:  Mardene Celeste. Lurene Shadow, M.D.  ASSISTANT:  Nurse.  ANESTHESIA:  MAC.  INDICATIONS FOR PROCEDURE:  [name] was brought back to the operating  room today for repeat dressing change of the packing left in her retroperitoneal intra-abdominal abscess.  DESCRIPTION OF PROCEDURE:  Following induction of satisfactory sedation with the patient positioned supinely, the abdomen was prepped and draped routinely.  The old gauze packing was removed from the wounds and the wounds were then thoroughly irrigated with pulsatile lavage using normal saline.  Drainage was clear.  I then used three large Penrose drains to place into the base of the wounds and I then  packed over the wounds with Kerlix gauze packing.  The anterior abdominal wound was similarly lavaged and packed.  Sponge, instrument and sharp counts were verified. The patient was removed from the operating room after sterile dressings had been applied. DD:  06/24/99 TD:  06/24/99 Job: [id number]

## 2010-10-25 NOTE — Op Note (Signed)
[place]. [facility]  Patient:    Brittney Valentine, Brittney Valentine                      MRN: [id number] Proc. Date: 07/06/99 Adm. Date:  [id number] Disc. Date: [id number] Attending:  Sonda Primes CC:         Mardene Celeste. Lurene Shadow, M.D. x 2                           Operative Report  PREOPERATIVE DIAGNOSIS:  Right flank retroperitoneal abscess.  POSTOPERATIVE DIAGNOSIS:  Right flank retroperitoneal abscess.  OPERATION:  Wound exploration, debridement, and dressing change under anesthesia.  SURGEON:  Mardene Celeste. Lurene Shadow, M.D.  ASSISTANT:  Nurse.  ANESTHESIA:  General.  INDICATION:  Ms. Brownrigg is a 70 year old lady who has a right flank retroperitoneal abscess which has been drained previously and she has been taken back to the operating room on two additional occasions for wound debridement and irritation.  She has continued to have some persistent drainage from the wound nd is brought back to the operating room now for re-exploration and debridement.  DESCRIPTION OF PROCEDURE:  Following the induction of satisfactory anesthesia, he patient is positioned supinely and the abdomen and flank prepped and draped to e included in a sterile operative field.  The packing from the wound were removed and using a pulse irrigator, the wound as thoroughly irrigated.  In palpation of the wound, I was able to break up an additional phlegmon in the region behind the right kidney and this was washed, aspirated, and as much inflammatory debride as could be safely removed was removed.  I used a pulse irrigator to additionally irrigate the wound thoroughly.  Both flank wounds were thoroughly irrigated.  The lower flank wound was healing well without difficulty.  The anterior abdominal wound is also healing satisfactorily.  The upper flank wound was then packed with saline-dampened Kerlix gauze.  All wounds were similarly packed.  Sterile dressings were  applied.  Attention was then turned to placing a left subclavian line.  After the abdomen  wounds were fully dressed, the right shoulder was prepped and draped.  I made a  left subclavian stick into the subclavian veins running the guide wire into the  central venous system and then threaded a three lumen catheter into the central  venous system, positioning it at approximately 18 cm.  This was sewn in place with silk sutures and secured with Tegaderm dressing.  The anesthetic was then reversed.  The patient removed from the operating room o the recovery room in stable condition having tolerated the procedure well. DD:  07/06/99 TD:  07/07/99 Job: [id number]

## 2010-10-25 NOTE — Discharge Summary (Signed)
[place]. [facility]  Patient:    Brittney Valentine, Brittney Valentine                      MRN: [id number] Adm. Date:  [id number] Disc. Date: [id number] Attending:  Sonda Primes CC:         Mardene Celeste. Lurene Shadow, M.D. (2 copies)                           Discharge Summary  ADMISSION DIAGNOSES: 1. Status post intra-abdominal abscess from small-bowel perforation. 2. Status post cholecystectomy. 3. Status post placement of right-sided chest tube. 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 5. History of hypertension. 6. History of paroxysmal atrial fibrillation.  HOSPITAL COURSE:  Following transfer to the [place], Ms. Pina did well initially, and she underwent local care for her local wound infection and dehiscence.  She was undergoing hydrotherapy for the wounds.  However, on June 06, 1999, she spiked a temperature up to 101.  She underwent local drainage of a small abscess cavity within the wound.  She then had a CT scan of the abdomen which showed a rather large right-sided flank collection.  She was taken down to the radiology suite where she had this drained under CT guidance.  The abscess cavity was noted to be MRSA and Prevotella loesheii. At this point she was seen in consultation by infectious disease consultants who recommended that she be continued on vancomycin.  Repeat CT scan showed a significant reduction in size of the flank abscesses; however, there was still a substantial collection somewhat cephalad that was not reached by the current catheters.  On June 13, 1999, she underwent a second CT-guided abscess aspiration and repositioning of her current drain.  At that time there were found to be multiple areas of complex abscesses which were aspirated and drained minimum amounts of pus.  Because of the presence of the multiloculated collection, she was returned to the operating room and underwent  open drainage of two flank abscesses.  Because of this new acute condition, she was readmitted into the acute care portion of the hospital. DD:  12/08/99 TD:  12/08/99 Job: [id number]

## 2010-10-25 NOTE — Consult Note (Signed)
[place]. [facility]  Patient:    Brittney Valentine, Brittney Valentine                      MRN: [id number] Proc. Date: 08/14/99 Adm. Date:  [id number] Attending:  Sonda Primes CC:         Leonie Man, M.D., General Surgery                          Consultation Report  SUBJECTIVE:  This pleasant 70 year old white female was referred for an evaluation because of persistent emesis that is ongoing.   Patient has a history of being admitted into [facility] after having a laparoscopic cholecystectomy. It was noted that the patient had a nick of her intestinal region, which was missed at the time of surgery.  Subsequently, the patient developed acute abdominal pain 4 hours postop at that time.  Patient underwent a CT scan and found to have air under the diaphragm.  She underwent a laparotomy and was found to have a perforated intestine.  Patient had repair, but subsequently developed complications such as abscesses, etc. due to leakage of intestinal material into the peritoneal region. Patient had a rough hospital course which required extensive evaluation and surgical repair of areas.  She subsequently was transferred to the step-down unit on 08/13/99.  Consultation was obtained at the time because of persistent nauseousness with vomiting that occurred.   The patient noticed that she can tolerate liquids and has had x-rays and scans, which showed no gross abnormalities that were present with the liquids obtained; however, when it came to ingestion of solid foods, the patient would have increased vomiting that was present.  The vomitus would occur relatively soon after ingestion of food at this time and usually only the material that was present at this time.  The studies that were done showed no evidence of any stricture formation or any obstructive process that was present at this time, as well as the barium material flowing freely through the  intestinal area at this time.   The patient denies any previous history of any peptic ulcer disease or ny liver disease.   The patient also denies any history of pancreatitis or gallbladder problems at this time.   She underwent a laparoscopic cholecystectomy because of the gallstones and discomfort but presently, these symptoms have been ongoing since the surgery and the healing process has been present.  OBJECTIVE FINDINGS: She is a very pleasant female who appears to in no acute distress tonight.  She is resting comfortably at the bedside in the step-down floor at this time.  VITAL SIGNS:  Stable.  HEENT:  Anicteric.  Neck:  Supple.  Lungs:  Clear to auscultation and percussion.  Heart:  Regular rate and rhythm without murmurs, rubs, or gallops.  Abdomen:  Soft.  Marked deformity of the abdominal wall secondary to surgery is  noted with a draining tube present in the region at this time.  No tenderness to palpation of the region at this time.  The skin appears to be healing and gradually granulating since the original surgery.  Extremities:  No clubbing, cyanosis, or edema.  LABORATORY DATA:  Showed evidence of body cultures showing abundance of enterococcus material that was present with increased sensitivity that was noted. Fungal smears showed no yeast growing at this time.  X-ray studies presently not available at this time since the patient has been transferred to the  step-down nit and no previous chart has been broken down.  Routine laboratory data also not presently available.  GROSS IMPRESSION: 1. Persistent emesis. 2. Status post surgery secondary to intestinal perforation.  PRESENT RECOMMENDATIONS:  I am presently going to proceed with an endoscopic examination for evidence of pyloric stenosis or acid peptic disease that may be a contributing factor.   If this problem is normal, the patient may be having gastroparesis secondary to the p.o. intake of  products and will need to then adjust the food products that the patient is ingesting to try to see if we can help to  improve her system.  I am going to recommend the use of prokinetics, which she presently has been on, but must review the data she was presently taking to see if we can help to benefit her with the prokinetic if deemed necessary to help her condition.   Depending upon these results will determine the course of therapy.  Thank you much for the consultation.  I will follow with you. DD:  08/14/99 TD:  08/14/99 Job: [id number]

## 2010-10-25 NOTE — Procedures (Signed)
[place]. [facility]  Patient:    Brittney Valentine, Brittney Valentine                      MRN: [id number] Proc. Date: 08/15/99 Adm. Date:  [id number] Attending:  Sonda Primes CC:         Mardene Celeste. Lurene Shadow, M.D.                           Procedure Report  REFERRING PHYSICIAN:  Luisa Hart L. Lurene Shadow, M.D.  PREOPERATIVE DIAGNOSIS:  Gastroparesis with persistent vomiting.  POSTOPERATIVE DIAGNOSIS:  Flecks of heme in the gastric region, otherwise normal endoscopic examination appreciated.  PROCEDURE:  Esophagogastroduodenoscopy with biopsies.  MEDICATIONS:  Demerol 25 mg intravenously and Versed 5 mg intravenously over ten minute period of time.  INSTRUMENT:  Olympus video panendoscope.  ENDOSCOPIST:  Sharyn Dross., M.D.  INFORMED CONSENT:  The patient was advised of the procedure, indications, and the risks involved.  The patient has agreed to have the procedure performed.  PREOPERATIVE PREPARATION:  The patient was brought to the endoscopy unit where n IV for IV sedating medication was started.  A monitor was placed on the patient to monitor the patients vital signs and oxygen saturation.  Nasal oxygen at 2 L per minute was used, and after adequate sedation was performed, the procedure was begun.  DESCRIPTION OF PROCEDURE:  The instrument was advanced with the patient lying in the left lateral position via the direct technique without difficulty.  The oropharyngeal, epiglottis, vocal cords, and piriform sinuses appeared to be grossly within normal limits.  The esophagus was normal without any evidence of acute inflammation, ulcerations, hiatal hernia, or varices appreciated.  It was difficult to note that there appeared to be peristaltic activity that was noted in the esophagus that was present.  The gastric area showed a normal mucous leak with evidence of specks of heme that was appreciated, but no evidence of acute inflammation or ulcerations  that was present at this time.  The antral area appeared to be within normal limits with a normal size pylorus that was appreciated at this time.  Initially, the pylorus as contracted down, but with pressure applied to it with the endoscope.  It gradually relaxed and the endoscope was able to advance through the pyloric orifice without difficulty.  The duodenal bulb and the second portion appeared to be grossly within normal limits without any complications or abnormalities noted.  The instrument was retracted back where biopsy for the CLO study was performed t the antral area.  Retroflexed view of the cardia revealed no evidence of a hiatal hernia, but flecks of heme were still noted in the gastric body appreciated. The instrument was subsequently withdrawn into the esophageal area and with slow retraction, the lower esophageal sphincter appeared to be grossly intact at this time without any increased spasticity that was noted.  The instrument was subsequently per oral without difficulty and the patient tolerated the procedure well.  TREATMENT: 1. I am going to try the patient with Reglan syrup, initially to take every six    hours p.o. 2. Will continue with the liquid diet at this time, and then advance to a full    liquid diet in approximately 24-48 hours.  Depending upon the results and the    response will determine the course of therapy. DD:  08/15/99 TD:  08/16/99 Job: [id number]

## 2010-10-25 NOTE — Discharge Summary (Signed)
[place]. [facility]  Patient:    Brittney, Valentine                        MRN: [id number] Adm. Date:  08/09/99 Disc. Date: 08/13/99 Attending:  Madaline Savage, M.D. Dictator:   Mancel Bale, P.A. CC:         Madaline Savage, M.D.             Mardene Celeste Lurene Shadow, M.D.             Dr. Marina Goodell, Internal Medicine             Terald Sleeper, M.D.                           Discharge Summary  ADMISSION DIAGNOSES: 1. Supraventricular tachycardia/atrial fibrillation. 2. Status post cardioversion with Adenocard. 3. History of paroxysmal atrial fibrillation postoperatively in January 2001. 4. Status post cholecystectomy with intra-abdominal abscess with culture    growing Escherichia coli and methicillin-resistant Staphylococcus aureus.  DISCHARGE DIAGNOSES: 1. Supraventricular tachycardia/atrial fibrillation. 2. Status post cardioversion with Adenocard. 3. History of paroxysmal atrial fibrillation postoperatively in January 2001. 4. Status post cholecystectomy with intra-abdominal abscess with culture    growing Escherichia coli and methicillin-resistant Staphylococcus aureus. 5. Anemia with bleed. 6. Elevated thyroid-stimulating hormone (TSH).  HISTORY OF PRESENT ILLNESS:  [name] is a 70 year old white female who has undergone a long hospitalization.  She was admitted with intra-abdominal abscess following cholecystectomy with culture growing E. coli and MRSA.  She had been on SACU since July 26, 1999.  She had had postoperative atrial fibrillation.  We were then reconsulted on the patient on August 08, 1999, for rapid palpitations.  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  Carotid massage was performed by Dr. Alanda Amass which converted the patient to sinus rhythm. On the morning of August 09, 1999, she again developed SVT, and was converted back to sinus rhythm with Adenocard.  As  well, she was found to have a hemoglobin down to 8.1.  Flecainide was then started on August 09, 1999.  She was already on calcium and beta blockers.  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  As well, she would need monitoring of anemia.  Additionally, her TSH had been found to be elevated when rechecked on August 08, 1999.  At that time, we added flecainide and planned to check a 2-D echocardiogram as well as continued IV antibiotics. We planned to hold aspirin and Plavix temporarily as she was anemic.  At that time, for her SVT, she was on Plavix and aspirin as well as Cardizem CD 240 1 p.o. q.d., flecainide 100 mg 1 p.o. b.i.d.  HOSPITAL COURSE:  On August 10, 1999, her hemoglobin was up to 8.5 from 8.1 after 2 units of packed red blood cells.  She had no further SVT episodes with the flecainide addition.  At that time, she was continued on her current medications with no change.  On August 11, 1999, she continued to have no further episodes of SVT with the flecainide, Cardizem, and Lopressor.  At this point, her anemia was improved with a hemoglobin of 9.9.  It was felt that she would probably stable to discharge  back to [facility] when bed available.  On March 5, she was continuing to tolerate the flecainide with no further SVT. Anemia was improved.  She had been continued on the antibiotics for the abdominal abscess and was followed by general surgery as well as infectious disease for that.  She was felt to be stable for discharge to SACU at this point.  On March 5, case management was assessing whether they would have a bed available in SACU at that point.  On March 5 at 1520, the nurse called regarding a 5-beat run of "VT."  The patient was found to be asymptomatic.  The EKG was reviewed by Dr. Jenne Campus. He agreed that the QTC was okay.  The magnesium level was pending. The patient was  still on flecainide at that point.  As mentioned, the QTC seemed normal; however, it was felt that we would not transfer the patient to SACU at that point and would keep her on the monitor another day.  On March 6, [name] strips were reviewed by Dr. Mayford Knife who states that it was not ventricular tachycardia and that the strips had revealed artifact. [name] is otherwise stable at this point with no complaints.  She is afebrile at 98.3, blood pressure 152/74, pulse 56.  She is in sinus rhythm. Her lungs are clear, and her heart is in regular rhythm with no murmurs, rubs, or gallops.  She is felt to be stable for discharge back to the SACU at this point.  HOSPITAL PROCEDURES:  None.  HOSPITAL CONSULTS:  Dr. Marina Goodell for infectious disease continued to follow the patient.  As well, Dr. Sherral Hammers with infectious disease follows the patient. As well, Dr. Lurene Shadow continued to follow the patient as they had been while the patient had been in [place].  HOSPITAL LABORATORY DATA:  Metabolic profile was essentially normal throughout the hospitalization.  On August 12, 1999, sodium 136, potassium 3.6, glucose 147, BUN 23, creatinine 0.9.  Her LFTs are elevated with SGOT 83 SGPT 70, alkaline phosphatase 140, total bilirubin normal at 0.3.  Her CBC showed a white blood count elevated in 12.9 to 16.3 range.  Her hemoglobin on March 3 was down to 8.5.  By March 5, her hemoglobin was up to 10.1.  ORDERS:  Her admission orders have already been ordered by Dr. Lurene Shadow for her admission back to [facility].  DISCHARGE MEDICATIONS: 1. Vancomycin 1250 mg IV q.48h. 2. Unasyn 3 g q.6h. 3. Flecainide 100 mg q.12h. 4. Niferex 150 mg p.o. b.i.d. 5. Lopressor 25 mg p.o. q.8h. 6. Reglan 10 mg 1 p.o. q.h.s. 7. Resource 240 ml t.i.d. 8. Cardizem 240 mg 1 p.o. q.d. 9. Celexa 20 mg 1 p.o. q.d.  DISPOSITION:  She is now being transferred to the SACU unit in stable condition. DD:  08/13/99 TD:  08/13/99 Job:  [id number]

## 2010-10-25 NOTE — Discharge Summary (Signed)
[place]. [facility]  Patient:    CHASTITY, NOLAND                      MRN: [id number] Adm. Date:  [id number] Disc. Date: [id number] Attending:  Sonda Primes CC:         Mardene Celeste. Lurene Shadow, M.D. (2 copies)                           Discharge Summary  HISTORY OF PRESENT ILLNESS AND HOSPITAL COURSE:  Ms. Radloff is readmitted to the SACU from the acute care floor.  She has been afebrile on Unasyn, still having increased nausea and vomiting while attempting to eat.  No dyspnea or palpitations.  Affect generally flat.  Continued on Celexa.  Seen in consultation by Dr. Margaretmary Bayley for evaluation of hypothyroidism.  She was started then on Synthroid 0.75 mg daily.  She continued to have wound irrigations, and her wounds continued to close slowly.  Her wounds continued to close.  Her persistent and continued problem was that of nausea and vomiting after eating despite all investigations showing no primary intestinal dysfunction.  She remained afebrile with stable vital signs.  Her thyroid stimulating hormone levels had abated significantly, down to 8.6.  Albumin was excellent and was normal at approximately 19.4.  The patient complained that the very sight of food caused her to become nauseated.  We met for psychiatric consultation at which time she was started on Remeron with significant improvement in her appetite over the ensuing days.  At this time her wounds are healing well and are almost completely healed.  She is being discharged to be followed up in the office in approximately two weeks.  DISCHARGE MEDICATIONS:  Celexa and Remeron.  ACTIVITY:  As tolerated.  DIET:  Unrestricted. DD:  12/08/99 TD:  12/08/99 Job: [id number]

## 2010-10-25 NOTE — Consult Note (Signed)
[place]. [facility]  Patient:    Brittney Valentine, Brittney Valentine                        MRN: [id number] Dictator:   Mindi Slicker. Lowell Guitar, M.D. CC:         Mardene Celeste. Lurene Shadow, M.D.                          Consultation Report  REASON FOR CONSULTATION:  I was asked by Dr. Lurene Shadow to see this 70 year old female who was admitted on May 13, 1999 and underwent laparoscopic cholecystectomy for cholecystitis.  On May 15, 1999 she underwent exploratory laparotomy for peritonitis secondary to small bowel perforation.  The latter was complicated by transient oliguria and acute renal failure, with serum creatinine up to 2.7 mg/dl from the baseline serum creatinine of 0.5 mg/dl.  This was treated successfully  with intravenous fluids with normalization of the serum creatinine.  The patients hospital course has been complicated by paroxysmal atrial fibrillation, congestive heart failure, respiratory insufficiency, and a large right pleural effusion requiring chest tube placement.  On May 31, 1999 the patient was transferred to the subacute unit.  She had problems with fever and abdominal pain and underwent abdominal CT guided drainage of an intra-abdominal abscess on June 08, 1999 and June 13, 1999.  Because of persistence of the infection she underwent open surgical drainage of what was found to be a retroperitoneal abscess on January 0, 2001 with intraoperative and postoperative fluid balance, oliguria, and worsening renal function.  Serial laboratory studies are as follows:  On May 22, 1999 creatinine was 0.5.  On June 18, 1999 BUN was 11, creatinine 1.1.  On June 19, 1999 BUN was 12, creatinine 1.9.  This afternoon BUN was 14, creatinine 2.2  mg/dl.  Intraoperatively 1600 ccs of fluid were taken in and 75 out; in the PACU 2920 100 ccs out and over the past 16 hours 640 ccs in, 235 ccs out.  CURRENT MEDICATIONS:  Include vancomycin, Celexa,  Cardizem, Digoxin, aspirin, Plavix, Pepcid, hydrocodone, morphine, Phenergan, Tylenol, and fluids.  PAST MEDICAL HISTORY:   Hypertension, paroxysmal atrial fibrillation, depression, and wound cultures which have grown E. coli and MRSA.  The patient has been followed during this hospitalization by the infectious disease physicians as well as cardiologists.  PHYSICAL EXAMINATION:  GENERAL:  She is an alert and oriented Caucasian female.  She is obese.  VITAL SIGNS:  Blood pressure 130/52, heart rate 80.  HEENT:  Neck veins are not bulging.  LUNGS:  Decreased breath sounds at the bases bilaterally.  HEART:  No pericardial friction rub, regular rhythm and rate.  ABDOMEN:  Bandages throughout the entire abdomen anteriorly and right flank. The right flank is soaked with bloody drainage.  There is presacral edema.  EXTREMITIES:  There is 1+ to trace lower extremity edema.  ASSESSMENT: 1.  Acute renal failure, not oliguric.  The etiology of the ARF is most likely secondary to "septic-hemodynamic" induced phenomenon.  RECOMMENDATIONS: 1.  Aggressive treatment of the underlying intra-abdominal process as you are     doing. 2.  Try to convert the patient to a non-oliguric state, and continue Lasix at     an increased dose. 3.  Colloid administration tonight to attempt to mobilize third space fluids;     i.e., give intravenous albumin. 4.  Renal dose dopamine to improve intrarenal hemodynamics. 5.  Will check chest x-ray  given positive fluid balance and plan for OR in     the a.m. 6.  Decrease Zosyn 2.25 grams q.6h. and pharmacy to dose vancomycin. 7.  Check urinalysis. 8.  If the patients renal function and urine output is not improved over the     next day or so check renal ultrasound to rule out obstruction given the     location of the abscess and recent surgery.  Thanks for letting us see this patient. DD:  06/19/99 TD:  06/20/99 Job: [id number]

## 2010-10-25 NOTE — Op Note (Signed)
NAME:  Brittney Valentine, Brittney Valentine                         ACCOUNT NO.:  [id number]   MEDICAL RECORD NO.:  [id number]                   PATIENT TYPE:  AMB   LOCATION:  [facility]                                 FACILITY:  [facility]   PHYSICIAN:  Lindaann Slough, M.D.               DATE OF BIRTH:  1940/11/06   DATE OF PROCEDURE:  02/24/2003  DATE OF DISCHARGE:                                 OPERATIVE REPORT   PREOPERATIVE DIAGNOSIS:  Left hydronephrosis and left ureteral stone.   POSTOPERATIVE DIAGNOSIS:  Left hydronephrosis.   OPERATION/PROCEDURE:  1. Cystoscopy.  2. Left retrograde pyelogram.  3. Ureteroscopy with stone extraction.  4. Insertion of double-J catheter.   SURGEON:  Lindaann Slough, M.D.   ANESTHESIA:  General.   INDICATIONS:  The patient is a 70 year old female who had been complaining  of frequency, dysuria, and nocturia x1-2.  She was treated with antibiotics  without any improvement.  Renal ultrasound showed left hydronephrosis. CT  scan of the abdomen and pelvis showed left hydronephrosis and a 4 x 6-mm  stone in the left distal ureter.  The patient is scheduled today for  cystoscopy and stone manipulation.   DESCRIPTION OF PROCEDURE:  Under general anesthesia, the patient was prepped  and draped and placed in the dorsal lithotomy position.  A #23 Wappler  cystoscope was inserted in the bladder.  The bladder mucosa is normal.  There is no stone or tumor in the bladder.  The ureteral orifices are in  normal position and shape with clear efflux.  Then, a cone-tip catheter was  passed through the cystoscope into the left ureteral orifice and contrast  was injected through the cone-tip catheter.  There is a filling defect in  the distal ureter about 3 cm above the ureteral orifice with proximal  dilation of the ureter and collecting system.  The cone-tip catheter was  removed.  A glidewire was then passed through the cystoscope into the left  ureter.  The cystoscope was then  removed.  A rigid ureteroscope was then  passed in the bladder but could not be passed through the ureteral orifice.  The ureteroscope was then removed.  A ureteroscope access sheath was then  passed over the glidewire into the distal ureter and was then removed.  The  ureteroscope was then passed in the bladder and into the ureter.  The stone  was visualized in the distal ureter and was then extracted with the nitinol  stone basket.  The ureteroscope was then inserted in the ureter and there  was no evidence of ureteral injury and contrast was injected until the  ureter was scoped, and there was no evidence of extravasation of contrast.  The ureteroscope was then removed.  The guidewire was backloaded into the  cystoscopy and an Intimal 6 French x 26 double- J catheter was passed over  the guidewire, the proximal curl of the double-J catheter is  in the  collecting system, distal curl in the bladder.  The bladder was then  emptied.  The cystoscope and guidewire were removed.   The patient was tolerated the procedure well and the left the OR in  satisfactory condition to post anesthesia care unit.                                                  Lindaann Slough, M.D.    MN/MEDQ  D:  02/24/2003  T:  02/24/2003  Job:  [id number]   cc:   Sharyn Dross., M.D.  [address]  [address]  [place]  [place] [address]  Fax: [phone]

## 2010-10-25 NOTE — Discharge Summary (Signed)
[place]. [facility]  Patient:    Brittney Valentine, Brittney Valentine                      MRN: [id number] Adm. Date:  [id number] Disc. Date: [id number] Attending:  Sonda Primes CC:         Mardene Celeste. Lurene Shadow, M.D. (2 copies)                           Discharge Summary  ADMISSION DIAGNOSES: 1. Chronic calculus cholecystitis. 2. Hypertension. 3. History of tachycardia.  DISCHARGE DIAGNOSES: 1. Chronic calculus cholecystitis. 2. Hypertension. 3. History of tachycardia.  PROCEDURES:  Laparoscopic cholecystectomy with intraoperative cholangiogram.  COMPLICATIONS:  Thermal small-bowel injury during surgery.  CONDITION ON DISCHARGE:  Stable on transfer to the SACU.  HISTORY OF PRESENT ILLNESS:  Brittney Valentine is a 70 year old patient admitted with a history of upper abdominal pain, nausea, and vomiting.  Gallbladder ultrasonography demonstrated cholelithiasis.  She was admitted routinely for a laparoscopic cholecystectomy.  HOSPITAL COURSE:  At the time of cholecystectomy she was noted to have some nodular cirrhosis of the liver.  Her outpatient otherwise seemed to have gone routinely.  However, in the postoperative period, she continued to have severe increasing abdominal pain with a leukocytosis of 18,800, and elevated liver function studies showing a total bilirubin of 1.2.  Abdomen was tender.  She was returned to the x-ray suite for a hepatobiliary scan to rule out bile leak.  The hepatobiliary scan was normal.  She continued to have pain, some associated hypotension.  Amylase level rose to 417 with a normal lipase.  On May 15, 1999, she was returned to the operating room for re-exploration. Noted to have severe peritonitis and what appeared to be a thermal born of a portion of the small bowel.  The small-bowel perforation was repaired at that time by performing a small-bowel resection and reanastomosis of the bowel. She was then transferred to the intensive  care unit, where she was volume repleted.  For a period of her recovery she had an episode of what appeared to be rapid atrial fibrillation with rates of 180 without associated chest pain or diaphoresis.  She was begun on a Cardizem drip, and cardiology consultation by Dr. Chanda Busing was requested.  Diagnosis of paroxysmal atrial fibrillation was made.  During this period of time her creatinine continued to rise.  Her urine output continued to be adequate.  Diagnosis of nonoliguric renal failure was made.  During the ensuing days, she continued to have a few recurrent episodes of atrial fibrillation requiring some increase in her Cardizem.  She continued requiring large amounts of volume.  Throughout this period she was continued on antibiotic coverage with imipenem 500 mg IV q.6h. By May 22, 1999, she had developed some respiratory distress and was seen in consultation by the critical care management, Dr. Sherene Sires, who diagnosed atelectatic respiratory insufficiency.  Also, chest x-ray showed a pleural effusion, and a chest tube was placed at that time.  This fluid was loculated, and there was very little return from the chest tube.  By May 23, 1999, she had shown significant improvement both in her respiratory condition, her saturations had risen to 99, heart rate was 74, and respirations had abated to approximately 20 breaths per minute.  Blood pressures were running somewhat between 120-150/50, controlled with diltiazem. Her abdominal wound, however, became somewhat soupy and, on opening, there was an ongoing  superficial wound infection felt to be caused by an underlying abscess and fistula.  The patient began tolerating a soft diet somewhat better and had been placed on TNA for nutritional supplementation.  At this point, her respiratory function had improved significantly with an oxygen saturation of 100% on 2 L nasal cannula. She had had some cultures from her wound which  grew out Candida, and she was subsequently started on Diflucan.  Bowel sounds were active.  There was no distention.  She was then transferred down to the [facility] for continued care. DD:  12/08/99 TD:  12/08/99 Job: [id number]

## 2010-10-25 NOTE — Discharge Summary (Signed)
[place]. [facility]  Patient:    Brittney Valentine, Brittney Valentine                        MRN: [id number] Adm. Date:  06/18/99 Disc. Date: 07/26/99 Attending:  Luisa Hart L. Lurene Shadow, M.D. CC:         Mardene Celeste. Lurene Shadow, M.D. (2 copies)   Discharge Summary  ADMISSION DIAGNOSIS:  Retroperitoneal abscess, status post incision and drainage.  DISCHARGE DIAGNOSES: 1. Retroperitoneal abscess. 2. Reactive depression with severe anorexia and nausea and vomiting. 3. Intracutaneous fistula.  PROCEDURES:  Drainage and debridement of retroperitoneal phlegmon x 3.  COMPLICATIONS:  Reactive depression.  CONDITION ON DISCHARGE:  Improved.  REASON FOR ADMISSION:  This patient is a 70 year old woman who suffered a small bowel injury during a cholecystectomy which was undetected. She subsequently apparently developed an intra-abdominal abscess. She was taken to the operating room and debrided and underwent a repair of her intestines. She subsequently developed a large retroperitoneal abscess requiring her readmission to the hospital from the [facility] on June 18, 1999, after she had undergone a debridement and drainage of her retroperitoneal abscess.  HOSPITAL COURSE:  Her postoperative course was marked by recurrent abscess requiring return to the operating room on June 20, 1999, for additional debridement, irrigation, and dressing changes. She was again returned to the operating room on June 24, 1999, for retroperitoneal abscess debridement and for dressing change under anesthesia. During the course of her treatment, she became really quite depressed and was seen in consultation by a psychiatrist who began her on Celexa with some significant improvement. She continued to do well with the course with slowly resuming her ability to eat, and her nausea eventually subsided. The intracutaneous fistula closed spontaneously. At the time of discharge back to the [facility], she was feeling fairly  well and tolerating full liquids well. She was returned to the [facility] where continued irrigation of her wound on a daily basis and dressing changes would be instituted. DD:  07/05/00 TD:  07/05/00 Job: [id number]

## 2010-10-25 NOTE — Op Note (Signed)
[place]. [facility]  Patient:    Brittney Valentine                       MRN: [id number] Proc. Date: 05/13/99 Adm. Date:  [id number] Attending:  Sonda Primes CC:         Mardene Celeste. Lurene Shadow, M.D. x 2             Dortha Kern, Montez Hageman., M.D.                           Operative Report  PREOPERATIVE DIAGNOSIS:  Chronic calculus, cholecystitis.  POSTOPERATIVE DIAGNOSIS:  Chronic calculus, cholecystitis, biliary cirrhosis.  PROCEDURE:  Laparoscopic cholecystectomy and intraoperative cholangiogram.  SURGEON:  Luisa Hart L. Lurene Shadow, M.D.  ASSISTANT:  Marnee Spring. Wiliam Ke, M.D.  ANESTHESIA:  General.  INDICATIONS:  This patient is a 70 year old woman, who presents initially with right-sided symptoms and upper abdominal pain associated with some pain penetrating to the back, nausea, but no emesis.  She has been having some symptoms of severe bloating.  She did have a temperature up to 102 recently treated with some Tylenol. Gallbladder ultrasound and KUB are consistent with cholelithiasis.  On review, er liver function studies show some mild elevations in the SGOT, which is up to 70, SGPT up to 52.  Total bilirubin is within normal limits at 0.5 and alkaline phosphatase is normal at 71.  She is routinely prepared and brought to the operating room for laparoscopic cholecystectomy with a possibility of having an  open cholecystectomy because of her previous abdominal surgery.  DESCRIPTION OF PROCEDURE:  Following the induction of satisfactory general anesthesia, the patient is positioned supinely, abdomen routinely prepped and draped to be included in a sterile operative field.  Open laparoscopy is created at the umbilicus.  I inserted my finger into the abdominal cavity and very carefully dissected out anterior abdominal wall adhesions away from the region f the umbilicus and inserted the Hasson cannula and insufflated the peritoneal cavity to 14 mmHg pressure.   Upon viewing the peritoneal cavity, we were able to carefully go through the adhesions up to the right upper quadrant.  Upon encountering the  liver, the patient had a macronodular liver consistent with some degree of cirrhosis.  The gallbladder appeared to be chronically scarred with multiple adhesions from the omentum up to the gallbladder.  Under direct vision, an epigastric and two lateral ports were placed.  Additional adhesions to the abdominal wall taken down to provide a clear view of the gallbladder and the liver.  Gallbladder is grasped and retracted cephalad and the dissection carried down n the region of the ampulla to isolate the cystic artery and cystic duct.  The cystic artery traced up to the cystic artery/gallbladder wall and the cystic duct traced up to the cystic duct/gallbladder junction.  The cystic artery doubly clipped and transected.  The cystic duct was clipped proximally and opened up.  I inserted  14-gauge angiocatheter through the gallbladder wall and through it, a Redic catheter was placed to the cystic duct and cholangiogram taken with one-half strength Hypaque showing normal caliber biliary ductal system without any evidence of filling defects and free-flow of contrast into the duodenum.  The cystic duct catheter was then removed and cystic duct doubly clipped and transected.  The gallbladder was then dissected free from the liver bed, maintaining hemostasis ith electrocautery.  At the end of the dissection, the  camera was then placed in the epigastric port.  Pictures of the liver and gallbladder were taken.  The gallbladder was retrieved through the umbilical port without difficulty. Sponge, instrument and sharp counts were verified and the wound closed in layers.  The wound is closed in layers as follow: epigastric wound and lateral flank was closed with 4-0 Dexon, the umbilical wound closed in two layers with 0 Dexon and 4-0 Dexon.  Sterile  dressings applied and the patient removed from the operating room to the recovery room in stable condition having tolerated the procedure well. DD:  05/13/99 TD:  05/14/99 Job: [id number]

## 2011-03-06 LAB — DIFFERENTIAL
Basophils Absolute: 0.1
Basophils Relative: 1
Eosinophils Absolute: 0
Eosinophils Relative: 1
Lymphocytes Relative: 20
Lymphs Abs: 1.5
Monocytes Absolute: 0.7
Monocytes Absolute: 0.8
Monocytes Relative: 10
Neutro Abs: 6.9
Neutrophils Relative %: 75

## 2011-03-06 LAB — COMPREHENSIVE METABOLIC PANEL
ALT: 47 — ABNORMAL HIGH
Albumin: 2.9 — ABNORMAL LOW
Alkaline Phosphatase: 71
BUN: 16
Calcium: 8.9
Chloride: 110
Chloride: 111
GFR calc Af Amer: >60
GFR calc non Af Amer: >60
GFR calc non Af Amer: >60
Glucose, Bld: 117 — ABNORMAL HIGH
Glucose, Bld: 95
Potassium: 4
Potassium: 4.5
Sodium: 140
Sodium: 141
Total Protein: 6.1

## 2011-03-06 LAB — PROTIME-INR: INR: 1.2

## 2011-03-06 LAB — URINALYSIS, ROUTINE W REFLEX MICROSCOPIC
Glucose, UA: NEGATIVE
Hgb urine dipstick: NEGATIVE
Nitrite: NEGATIVE
Specific Gravity, Urine: 1.03

## 2011-03-06 LAB — BASIC METABOLIC PANEL
CO2: 29
Chloride: 105
Creatinine, Ser: 0.78
GFR calc Af Amer: >60

## 2011-03-06 LAB — CBC
HCT: 40.9
Hemoglobin: 13.4
Hemoglobin: 14
MCHC: 34.1
MCV: 90.8
MCV: 91.7
MCV: 92.1
RBC: 4.29
RBC: 4.44
RDW: 14.4
WBC: 9.2

## 2011-03-06 LAB — URINE CULTURE

## 2011-03-06 LAB — URINE MICROSCOPIC-ADD ON

## 2011-08-22 ENCOUNTER — Other Ambulatory Visit: Payer: Self-pay | Admitting: Internal Medicine

## 2011-08-22 DIAGNOSIS — R7989 Other specified abnormal findings of blood chemistry: Secondary | ICD-10-CM

## 2011-08-26 ENCOUNTER — Ambulatory Visit
Admission: RE | Admit: 2011-08-26 | Discharge: 2011-08-26 | Disposition: A | Discharge status: Home / Self Care | Payer: Medicare Other | Source: Ambulatory Visit | Attending: Internal Medicine | Admitting: Internal Medicine

## 2011-08-26 DIAGNOSIS — R7989 Other specified abnormal findings of blood chemistry: Secondary | ICD-10-CM

## 2011-08-26 MED ORDER — IOHEXOL 300 MG/ML  SOLN
125.0000 mL | Freq: Once | INTRAMUSCULAR | Status: AC | PRN
  Administered 2011-08-26: 125 mL via INTRAVENOUS

## 2011-12-10 ENCOUNTER — Telehealth: Payer: Self-pay | Admitting: Internal Medicine

## 2011-12-10 NOTE — Telephone Encounter (Signed)
Brittney Valentine will send labs.  Patient with a history of cirrhosis.  She will come in on 12/22/11 2:15

## 2011-12-22 ENCOUNTER — Encounter: Payer: Self-pay | Admitting: Internal Medicine

## 2011-12-22 ENCOUNTER — Ambulatory Visit (INDEPENDENT_AMBULATORY_CARE_PROVIDER_SITE_OTHER): Payer: Medicare Other | Admitting: Internal Medicine

## 2011-12-22 VITALS — BP 150/82 | HR 82 | Ht 65.0 in | Wt 222.4 lb

## 2011-12-22 DIAGNOSIS — I8500 Esophageal varices without bleeding: Secondary | ICD-10-CM

## 2011-12-22 DIAGNOSIS — R194 Change in bowel habit: Secondary | ICD-10-CM

## 2011-12-22 DIAGNOSIS — R198 Other specified symptoms and signs involving the digestive system and abdomen: Secondary | ICD-10-CM

## 2011-12-22 DIAGNOSIS — K7460 Unspecified cirrhosis of liver: Secondary | ICD-10-CM

## 2011-12-22 MED ORDER — MOVIPREP 100 G PO SOLR: ORAL | Status: DC

## 2011-12-22 NOTE — Patient Instructions (Addendum)
You have been scheduled for an endoscopy and colonoscopy with propofol. Please follow the written instructions given to you at your visit today. Please pick up your prep at the pharmacy within the next 1-3 days. If you use inhalers (even only as needed), please bring them with you on the day of your procedure.  We have contacted Dr. Kathryne Sharper office to send your recent labs.  Thank you for choosing me and [facility].

## 2011-12-22 NOTE — Progress Notes (Signed)
Subjective:    Patient ID: Brittney Valentine, female    DOB: Jan 11, 1941, 71 y.o.   MRN: [id number]  HPI This elderly white woman presents for followup and evaluation. 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 She has not been seen since 2011 when she had a surveillance upper endoscopy for esophageal varices. In the interim she developed intermittent nausea vomiting and diarrhea in the winter in spring of this year. She saw primary care with upper have to be having gastroneuritis. CT scan and lab work up was performed and as reflected in the data reviewed section. No specific cause of problems was found. She stopped her walking, she had been walking in mild day somewhere around this time. Over time she has noted irregular bowel habits with small stools at times though they will return to normal. She does not move her bowels for 2-3 days and says that highly atypical for her. Other than the spells of nausea vomiting and diarrhea stools have been formed. He has not had bleeding.  About 2 weeks ago she developed shingles, she's had right inguinal and buttock and low back pain associated with that. She took a steroid taper and narcotic pain medication and is off of both of those now and is improving though still has some residual numbness and hyperesthesia.  Allergies  Allergen Reactions  . Sulfonamide Derivatives    No outpatient prescriptions prior to visit.   Past Medical History  Diagnosis Date  . Shingles   . Diverticulosis   . Hemorrhoids   . Portal hypertensive gastropathy   . Varices, esophageal   . Obesity   . Cirrhosis   . Zoster   . Partial small bowel obstruction    Past Surgical History  Procedure Date  . Esophagogastroduodenoscopy multiple  . Colonoscopy   . Abdominal hysterectomy   . Cholecystectomy 2000  . Appendectomy 1991   History   Social History  . Marital Status: Single            .     Social History Main Topics  . Smoking  status: Never Smoker   . Smokeless tobacco: Never Used  . Alcohol Use: No  . Drug Use: No    Family History  Problem Relation Age of Onset  . Heart failure Mother     died from  . Heart attack Father     died from       Review of Systems As per history of present illness    Objective:   Physical Exam General:  NAD, obese Eyes:   anicteric Lungs:  clear Heart:  S1S2 no rubs, murmurs or gallops Abdomen:  Obese, soft and nontender, BS+, I can detect no hepatosplenomegaly Ext:   no edema Skin:  In the right inguinal and gluteal region there is a healing vesicular rash with erythema consistent with a healing herpes zoster infection    Data Reviewed:  Computed tomography abdomen and pelvis scan March 2013 with a nodular cirrhotic liver, left renal stone in the collecting system Labs from March 2013 demonstrate AST 106 ALT 46. Her CBC was normal, electrolytes and other chemistries normal.       Assessment & Plan:   1. Cirrhosis   Cross-sectional imaging of the liver CT scan shows nodular cirrhosis but no mass lesions. Things seem stable.   2. Esophageal varices   It is appropriate time for surveillance and screening examination to determine the size of these and whether or not she might  need pharmacologic therapy to reduce bleeding risk.     3. Change in bowel habits   Although probably a benign issue, perhaps related to inactivity, there has been a change in her bowels with new constipation and sometimes she has small stools. I think the nausea and vomiting and diarrhea she had, what the intermittent spells earlier, may have been having partial small bowel obstructions that  recurrent gastroenteritis is not ruled out either.  We discussed the pros and cons of proceeding with a colonoscopy, last one was in 2005. It seems reasonable to rule out any troubles like occult gastrointestinal neoplasm. The risks and benefits as well as alternatives of endoscopic procedure(s) have  been discussed and reviewed. All questions answered. The patient agrees to proceed.    CC: Nadean Corwin, MD

## 2012-01-01 ENCOUNTER — Ambulatory Visit: Payer: Medicare Other | Admitting: Internal Medicine

## 2012-02-16 ENCOUNTER — Telehealth: Payer: Self-pay | Admitting: Internal Medicine

## 2012-02-16 NOTE — Telephone Encounter (Signed)
Error

## 2012-02-26 ENCOUNTER — Encounter: Payer: Medicare Other | Admitting: Internal Medicine

## 2012-03-19 ENCOUNTER — Ambulatory Visit (AMBULATORY_SURGERY_CENTER): Payer: Medicare Other | Admitting: Internal Medicine

## 2012-03-19 ENCOUNTER — Encounter: Payer: Self-pay | Admitting: Internal Medicine

## 2012-03-19 VITALS — BP 125/66 | HR 68 | Temp 97.7°F | Resp 18 | Ht 65.0 in | Wt 222.0 lb

## 2012-03-19 DIAGNOSIS — K648 Other hemorrhoids: Secondary | ICD-10-CM

## 2012-03-19 DIAGNOSIS — R194 Change in bowel habit: Secondary | ICD-10-CM

## 2012-03-19 DIAGNOSIS — I8500 Esophageal varices without bleeding: Secondary | ICD-10-CM

## 2012-03-19 DIAGNOSIS — K766 Portal hypertension: Secondary | ICD-10-CM

## 2012-03-19 DIAGNOSIS — R198 Other specified symptoms and signs involving the digestive system and abdomen: Secondary | ICD-10-CM

## 2012-03-19 DIAGNOSIS — D126 Benign neoplasm of colon, unspecified: Secondary | ICD-10-CM

## 2012-03-19 DIAGNOSIS — K5730 Diverticulosis of large intestine without perforation or abscess without bleeding: Secondary | ICD-10-CM

## 2012-03-19 MED ORDER — SODIUM CHLORIDE 0.9 % IV SOLN: 500.0000 mL | INTRAVENOUS | Status: DC

## 2012-03-19 NOTE — Patient Instructions (Addendum)
The varices or veins in the esophagus remain small. You have some extra blood flow in the stomach lining called portal gastropathy. Overall things not bad here.  One medium polyp was removed from the rectum, small hemorrhoids and diverticulosis seen.  I will send you a letter about the polyp.  Thank you for choosing me and [facility].  Iva Boop, MD, FACG  YOU HAD AN ENDOSCOPIC PROCEDURE TODAY AT THE [facility]: Refer to the procedure report that was given to you for any specific questions about what was found during the examination.  If the procedure report does not answer your questions, please call your gastroenterologist to clarify.  If you requested that your care partner not be given the details of your procedure findings, then the procedure report has been included in a sealed envelope for you to review at your convenience later.  YOU SHOULD EXPECT: Some feelings of bloating in the abdomen. Passage of more gas than usual.  Walking can help get rid of the air that was put into your GI tract during the procedure and reduce the bloating. If you had a lower endoscopy (such as a colonoscopy or flexible sigmoidoscopy) you may notice spotting of blood in your stool or on the toilet paper. If you underwent a bowel prep for your procedure, then you may not have a normal bowel movement for a few days.  DIET: Your first meal following the procedure should be a light meal and then it is ok to progress to your normal diet.  A half-sandwich or bowl of soup is an example of a good first meal.  Heavy or fried foods are harder to digest and may make you feel nauseous or bloated.  Likewise meals heavy in dairy and vegetables can cause extra gas to form and this can also increase the bloating.  Drink plenty of fluids but you should avoid alcoholic beverages for 24 hours.  ACTIVITY: Your care partner should take you home directly after the procedure.  You should plan to take it  easy, moving slowly for the rest of the day.  You can resume normal activity the day after the procedure however you should NOT DRIVE or use heavy machinery for 24 hours (because of the sedation medicines used during the test).    SYMPTOMS TO REPORT IMMEDIATELY: A gastroenterologist can be reached at any hour.  During normal business hours, 8:30 AM to 5:00 PM Monday through Friday, call [phone].  After hours and on weekends, please call the GI answering service at [phone] who will take a message and have the physician on call contact you.   Following lower endoscopy (colonoscopy or flexible sigmoidoscopy):  Excessive amounts of blood in the stool  Significant tenderness or worsening of abdominal pains  Swelling of the abdomen that is new, acute  Fever of 100F or higher  Following upper endoscopy (EGD)  Vomiting of blood or coffee ground material  New chest pain or pain under the shoulder blades  Painful or persistently difficult swallowing  New shortness of breath  Fever of 100F or higher  Black, tarry-looking stools  FOLLOW UP: If any biopsies were taken you will be contacted by phone or by letter within the next 1-3 weeks.  Call your gastroenterologist if you have not heard about the biopsies in 3 weeks.  Our staff will call the home number listed on your records the next business day following your procedure to check on you and address any questions  or concerns that you may have at that time regarding the information given to you following your procedure. This is a courtesy call and so if there is no answer at the home number and we have not heard from you through the emergency physician on call, we will assume that you have returned to your regular daily activities without incident.  SIGNATURES/CONFIDENTIALITY: You and/or your care partner have signed paperwork which will be entered into your electronic medical record.  These signatures attest to the fact that that the  information above on your After Visit Summary has been reviewed and is understood.  Full responsibility of the confidentiality of this discharge information lies with you and/or your care-partner.   hold aspirin, all aspirin products, all non steroidal anti inflammatory products like advil, aleve, motrin for 2 weeks. Take tylenol only as needed for pain.  May resume these medicines on 04-02-12

## 2012-03-19 NOTE — Progress Notes (Signed)
Patient did not experience any of the following events: a burn prior to discharge; a fall within the facility; wrong site/side/patient/procedure/implant event; or a hospital transfer or hospital admission upon discharge from the facility. (G8907) Patient did not have preoperative order for IV antibiotic SSI prophylaxis. (G8918)  

## 2012-03-19 NOTE — Progress Notes (Signed)
The pt tolerated the egd and colonoscopy very well. Brittney Valentine

## 2012-03-19 NOTE — Op Note (Signed)
[facility] [address] [place], [address]   COLONOSCOPY PROCEDURE REPORT  PATIENT: Brittney, Valentine  MR#: [id number] BIRTHDATE: August 26, 1940 , 70  yrs. old GENDER: Female ENDOSCOPIST: Iva Boop, MD, [facility]  PROCEDURE DATE:  03/19/2012 PROCEDURE:   Colonoscopy with snare polypectomy ASA CLASS:   Class III INDICATIONS:change in bowel habits. MEDICATIONS: There was residual sedation effect present from prior procedure, propofol (Diprivan) 100mg  IV, MAC sedation, administered by CRNA, and These medications were titrated to patient response per physician's verbal order  DESCRIPTION OF PROCEDURE:   After the risks benefits and alternatives of the procedure were thoroughly explained, informed consent was obtained.  A digital rectal exam revealed no abnormalities of the rectum.   The LB CF-H180AL [id number]  endoscope was introduced through the anus and advanced to the surgical anastomosis. No adverse events experienced.   The quality of the prep was adequate, using MoviPrep  The instrument was then slowly withdrawn as the colon was fully examined.      COLON FINDINGS: A polypoid shaped sessile polyp measuring 8 mm in size was found in the rectum.  A polypectomy was performed using snare cautery.  The resection was complete and the polyp tissue was completely retrieved.   Moderate diverticulosis was noted The finding was in the left colon.   Small internal hemorrhoids were found.   The colon mucosa was otherwise normal., s/p right hemicolectomy, Retroflexion showed small internal hemorrhoids. The time to anastomosis=4 minutes 25 seconds.  Withdrawal time=6 minutes 59 seconds.  The scope was withdrawn and the procedure completed. COMPLICATIONS: There were no complications.  ENDOSCOPIC IMPRESSION: 1.   Sessile polyp measuring 8 mm in size was found in the rectum; polypectomy was performed using snare cautery 2.   Moderate diverticulosis was noted in the left  colon 3.   Small internal hemorrhoids 4.   The colon mucosa was otherwise normal adequate prep, s/p right hemicolectomy  RECOMMENDATIONS: Hold aspirin, aspirin products, and anti-inflammatory medication for 1 week.   eSigned:  Iva Boop, MD, [facility] 03/19/2012 2:49 PM  cc: Lucky Cowboy, MD and The Patient

## 2012-03-19 NOTE — Op Note (Signed)
[facility] [address] [place], [address]   ENDOSCOPY PROCEDURE REPORT  PATIENT: Brittney Valentine, Brittney Valentine  MR#: [id number] BIRTHDATE: August 11, 1940 , 70  yrs. old GENDER: Female ENDOSCOPIST: Iva Boop, MD, [facility] REFERRED BY: PROCEDURE DATE:  03/19/2012 PROCEDURE:  EGD, diagnostic ASA CLASS:     Class III INDICATIONS:  surveillance of varices. MEDICATIONS: propofol (Diprivan) 150mg  IV, MAC sedation, administered by CRNA, and These medications were titrated to patient response per physician's verbal order TOPICAL ANESTHETIC: Cetacaine Spray  DESCRIPTION OF PROCEDURE: After the risks benefits and alternatives of the procedure were thoroughly explained, informed consent was obtained.  The LB GIF-H180 [id number] endoscope was introduced through the mouth and advanced to the second portion of the duodenum. Without limitations.  The instrument was slowly withdrawn as the mucosa was fully examined.      ESOPHAGUS: There were 2 columns of small (trace) varices in the distal and middle third of the esophagus.  The varices were not actively bleeding.   No stigmata.  STOMACH: Mild portal hypertensive gastropathy was found in the entire examined stomach.  DUODENUM: The duodenal mucosa showed no abnormalities in the bulb and second portion of the duodenum.  Retroflexed views revealed no abnormalities.     The scope was then withdrawn from the patient and the procedure completed.  COMPLICATIONS: There were no complications. ENDOSCOPIC IMPRESSION: 1.   There were 2 columns of small varices in the distal and middle third of the esophagus; The varices were trace in size. 2.   Portal hypertensive gastropathy was found in the entire examined stomach 3.   The duodenal mucosa showed no abnormalities in the bulb and second portion of the duodenum   REPEAT EXAM: In 3 year(s)  for EGD .  eSigned:  Iva Boop, MD, [facility] 03/19/2012 2:44 PM [id number] Oneta Rack, MD and The  Patient

## 2012-03-22 ENCOUNTER — Telehealth: Payer: Self-pay | Admitting: *Deleted

## 2012-03-22 NOTE — Telephone Encounter (Signed)
  Follow up Call-  Call back number 03/19/2012  Post procedure Call Back phone  # [phone]  Permission to leave phone message Yes     Patient questions:  Do you have a fever, pain , or abdominal swelling? no Pain Score  0 *  Have you tolerated food without any problems? yes  Have you been able to return to your normal activities? yes  Do you have any questions about your discharge instructions: Diet   no Medications  no Follow up visit  no  Do you have questions or concerns about your Care? no  Actions: * If pain score is 4 or above: No action needed, pain <4.

## 2012-03-25 ENCOUNTER — Encounter: Payer: Self-pay | Admitting: Internal Medicine

## 2012-03-25 DIAGNOSIS — Z860100 Personal history of colon polyps, unspecified: Secondary | ICD-10-CM

## 2012-03-25 HISTORY — DX: Personal history of colonic polyps: Z86.010

## 2012-03-25 HISTORY — DX: Personal history of colon polyps, unspecified: Z86.0100

## 2012-03-25 NOTE — Progress Notes (Signed)
Quick Note:  Adenoma Repeat colon 2018 ______

## 2012-04-01 ENCOUNTER — Encounter (HOSPITAL_COMMUNITY): Payer: Self-pay | Admitting: *Deleted

## 2012-04-01 ENCOUNTER — Other Ambulatory Visit (HOSPITAL_COMMUNITY): Payer: Self-pay | Admitting: Internal Medicine

## 2012-04-01 ENCOUNTER — Ambulatory Visit (HOSPITAL_COMMUNITY)
Admission: RE | Admit: 2012-04-01 | Discharge: 2012-04-01 | Disposition: A | Discharge status: Home / Self Care | Payer: Medicare Other | Source: Ambulatory Visit | Attending: Internal Medicine | Admitting: Internal Medicine

## 2012-04-01 ENCOUNTER — Inpatient Hospital Stay (HOSPITAL_COMMUNITY)
Admission: EM | Admit: 2012-04-01 | Discharge: 2012-04-05 | DRG: 390 | Disposition: A | Discharge status: Home / Self Care | Payer: Medicare Other | Attending: General Surgery | Admitting: General Surgery

## 2012-04-01 DIAGNOSIS — R109 Unspecified abdominal pain: Secondary | ICD-10-CM

## 2012-04-01 DIAGNOSIS — I517 Cardiomegaly: Secondary | ICD-10-CM | POA: Insufficient documentation

## 2012-04-01 DIAGNOSIS — R079 Chest pain, unspecified: Secondary | ICD-10-CM | POA: Insufficient documentation

## 2012-04-01 DIAGNOSIS — K56609 Unspecified intestinal obstruction, unspecified as to partial versus complete obstruction: Secondary | ICD-10-CM

## 2012-04-01 DIAGNOSIS — R1013 Epigastric pain: Secondary | ICD-10-CM | POA: Diagnosis present

## 2012-04-01 DIAGNOSIS — R110 Nausea: Secondary | ICD-10-CM | POA: Insufficient documentation

## 2012-04-01 DIAGNOSIS — E669 Obesity, unspecified: Secondary | ICD-10-CM | POA: Diagnosis present

## 2012-04-01 LAB — COMPREHENSIVE METABOLIC PANEL
ALT: 38 U/L — ABNORMAL HIGH (ref 0–35)
Alkaline Phosphatase: 82 U/L (ref 39–117)
BUN: 16 mg/dL (ref 6–23)
CO2: 21 mEq/L (ref 19–32)
GFR calc Af Amer: >90 mL/min (ref >90)
GFR calc non Af Amer: 87 mL/min — ABNORMAL LOW (ref >90)
Glucose, Bld: 102 mg/dL — ABNORMAL HIGH (ref 70–99)
Potassium: 4.7 mEq/L (ref 3.5–5.1)
Sodium: 134 mEq/L — ABNORMAL LOW (ref 135–145)
Total Bilirubin: 0.9 mg/dL (ref 0.3–1.2)

## 2012-04-01 LAB — CBC WITH DIFFERENTIAL/PLATELET
Basophils Relative: 0 % (ref 0–1)
Eosinophils Relative: 0 % (ref 0–5)
HCT: 39.9 % (ref 36.0–46.0)
Hemoglobin: 13.5 g/dL (ref 12.0–15.0)
Lymphocytes Relative: 18 % (ref 12–46)
MCH: 30.8 pg (ref 26.0–34.0)
Neutro Abs: 3.6 10*3/uL (ref 1.7–7.7)
Neutrophils Relative %: 69 % (ref 43–77)
RBC: 4.39 MIL/uL (ref 3.87–5.11)

## 2012-04-01 MED ORDER — MORPHINE SULFATE 4 MG/ML IJ SOLN
4.0000 mg | Freq: Once | INTRAMUSCULAR | Status: AC
  Administered 2012-04-01: 4 mg via INTRAVENOUS
  Filled 2012-04-01: qty 1

## 2012-04-01 MED ORDER — ONDANSETRON HCL 4 MG/2ML IJ SOLN
4.0000 mg | Freq: Four times a day (QID) | INTRAMUSCULAR | Status: DC | PRN
  Administered 2012-04-02: 4 mg via INTRAVENOUS
  Filled 2012-04-01: qty 2

## 2012-04-01 MED ORDER — MORPHINE SULFATE 2 MG/ML IJ SOLN
2.0000 mg | INTRAMUSCULAR | Status: DC | PRN
  Administered 2012-04-01 (×2): 2 mg via INTRAVENOUS
  Filled 2012-04-01 (×2): qty 1

## 2012-04-01 MED ORDER — ONDANSETRON HCL 4 MG/2ML IJ SOLN
4.0000 mg | Freq: Once | INTRAMUSCULAR | Status: AC
  Administered 2012-04-01: 4 mg via INTRAVENOUS
  Filled 2012-04-01: qty 2

## 2012-04-01 MED ORDER — SODIUM CHLORIDE 0.9 % IV SOLN
INTRAVENOUS | Status: DC
  Administered 2012-04-01 – 2012-04-04 (×3): via INTRAVENOUS

## 2012-04-01 MED ORDER — KCL IN DEXTROSE-NACL 20-5-0.9 MEQ/L-%-% IV SOLN
INTRAVENOUS | Status: DC
  Administered 2012-04-01 – 2012-04-03 (×4): via INTRAVENOUS
  Filled 2012-04-01 (×5): qty 1000

## 2012-04-01 MED ORDER — PANTOPRAZOLE SODIUM 40 MG IV SOLR
40.0000 mg | Freq: Every day | INTRAVENOUS | Status: DC
  Administered 2012-04-01 – 2012-04-03 (×3): 40 mg via INTRAVENOUS
  Filled 2012-04-01 (×5): qty 40

## 2012-04-01 NOTE — ED Provider Notes (Signed)
History     CSN: [id number]  Arrival date & time 04/01/12  1155   First MD Initiated Contact with Patient 04/01/12 1245      No chief complaint on file.   (Consider location/radiation/quality/duration/timing/severity/associated sxs/prior treatment) HPI Comments: Patient has history of a small bowel extraction approximately 2-3 years ago. She came in today with similar symptoms of mid abdominal pain and nausea. She states that the pain started last night he has gotten worse this morning. She denies any bowel movements today. She denies passing any gas. She describes it as a crampy pain in her midabdomen. She was seen by her primary care physician and had an x-ray of her abdomen which showed early or partial small bowel obstruction. She was sent over here for admission. She denies any fevers. Denies any UTI symptoms. Her last small bowel obstruction resolved without surgery   Past Medical History  Diagnosis Date  . Shingles   . Diverticulosis   . Hemorrhoids   . Portal hypertensive gastropathy   . Varices, esophageal   . Obesity   . Cirrhosis   . Zoster   . Partial small bowel obstruction   . Anemia   . Blood transfusion without reported diagnosis   . Heart murmur   . Thyroid disease   . Personal history of colonic polyps 03/25/2012    8 mm rectal adenoma 03/2012    Past Surgical History  Procedure Date  . Esophagogastroduodenoscopy multiple  . Colonoscopy   . Abdominal hysterectomy   . Cholecystectomy 2000  . Appendectomy 1991  . Colon surgery   . Colon surgery     Family History  Problem Relation Age of Onset  . Heart failure Mother     died from  . Heart attack Father     died from    History  Substance Use Topics  . Smoking status: Never Smoker   . Smokeless tobacco: Never Used  . Alcohol Use: No    OB History    Grav Para Term Preterm Abortions TAB SAB Ect Mult Living                  Review of Systems  Constitutional: Negative for fever,  chills, diaphoresis and fatigue.  HENT: Negative for congestion, rhinorrhea and sneezing.   Eyes: Negative.   Respiratory: Negative for cough, chest tightness and shortness of breath.   Cardiovascular: Negative for chest pain and leg swelling.  Gastrointestinal: Positive for nausea and abdominal pain. Negative for vomiting, diarrhea and blood in stool.  Genitourinary: Negative for frequency, hematuria, flank pain and difficulty urinating.  Musculoskeletal: Negative for back pain and arthralgias.  Skin: Negative for rash.  Neurological: Negative for dizziness, speech difficulty, weakness, numbness and headaches.    Allergies  Sulfonamide derivatives  Home Medications   Current Outpatient Rx  Name Route Sig Dispense Refill  . FLECAINIDE ACETATE 100 MG PO TABS Oral Take 100 mg by mouth 2 (two) times daily.    Marland Kitchen LEVOTHYROXINE SODIUM 112 MCG PO TABS Oral Take 112 mcg by mouth every morning.     Marland Kitchen MENTHOL (TOPICAL ANALGESIC) 4 % EX GEL Apply externally Apply 1 application topically 4 (four) times daily as needed. As needed to painful areas  For shingles    . PREGABALIN 50 MG PO CAPS Oral Take 50 mg by mouth 2 (two) times daily.      BP 157/67  Pulse 72  Temp 98.2 F (36.8 C) (Oral)  Resp 20  SpO2 97%  Physical Exam  Constitutional: She is oriented to person, place, and time. She appears well-developed and well-nourished.  HENT:  Head: Normocephalic and atraumatic.  Eyes: Pupils are equal, round, and reactive to light.  Neck: Normal range of motion. Neck supple.  Cardiovascular: Normal rate, regular rhythm and normal heart sounds.   Pulmonary/Chest: Effort normal and breath sounds normal. No respiratory distress. She has no wheezes. She has no rales. She exhibits no tenderness.  Abdominal: Soft. Bowel sounds are normal. There is tenderness (Large midabdominal scar. She has mild diffuse tenderness). There is no rebound and no guarding.  Musculoskeletal: Normal range of motion. She  exhibits no edema.  Lymphadenopathy:    She has no cervical adenopathy.  Neurological: She is alert and oriented to person, place, and time.  Skin: Skin is warm and dry. No rash noted.  Psychiatric: She has a normal mood and affect.    ED Course  Procedures (including critical care time)  Results for orders placed during the hospital encounter of 04/01/12  CBC WITH DIFFERENTIAL      Component Value Range   WBC 5.2  4.0 - 10.5 K/uL   RBC 4.39  3.87 - 5.11 MIL/uL   Hemoglobin 13.5  12.0 - 15.0 g/dL   HCT 95.6  21.3 - 08.6 %   MCV 90.9  78.0 - 100.0 fL   MCH 30.8  26.0 - 34.0 pg   MCHC 33.8  30.0 - 36.0 g/dL   RDW 57.8  46.9 - 62.9 %   Platelets 105 (*) 150 - 400 K/uL   Neutrophils Relative 69  43 - 77 %   Lymphocytes Relative 18  12 - 46 %   Monocytes Relative 13 (*) 3 - 12 %   Eosinophils Relative 0  0 - 5 %   Basophils Relative 0  0 - 1 %   Neutro Abs 3.6  1.7 - 7.7 K/uL   Lymphs Abs 0.9  0.7 - 4.0 K/uL   Monocytes Absolute 0.7  0.1 - 1.0 K/uL   Eosinophils Absolute 0.0  0.0 - 0.7 K/uL   Basophils Absolute 0.0  0.0 - 0.1 K/uL   Smear Review PLATELET COUNT CONFIRMED BY SMEAR    COMPREHENSIVE METABOLIC PANEL      Component Value Range   Sodium 134 (*) 135 - 145 mEq/L   Potassium 4.7  3.5 - 5.1 mEq/L   Chloride 103  96 - 112 mEq/L   CO2 21  19 - 32 mEq/L   Glucose, Bld 102 (*) 70 - 99 mg/dL   BUN 16  6 - 23 mg/dL   Creatinine, Ser 5.28  0.50 - 1.10 mg/dL   Calcium 9.4  8.4 - 41.3 mg/dL   Total Protein 6.8  6.0 - 8.3 g/dL   Albumin 3.3 (*) 3.5 - 5.2 g/dL   AST 69 (*) 0 - 37 U/L   ALT 38 (*) 0 - 35 U/L   Alkaline Phosphatase 82  39 - 117 U/L   Total Bilirubin 0.9  0.3 - 1.2 mg/dL   GFR calc non Af Amer 87 (*) >90 mL/min   GFR calc Af Amer >90  >90 mL/min   Dg Chest 2 View  04/01/2012  *RADIOLOGY REPORT*  Clinical Data: 71 year old female pain.  CHEST - 2 VIEW  Comparison: 12/08/2007.  Findings: Low lung volumes.  Cardiac size increased, to a degree might be  accentuated due to lower volumes. Other mediastinal contours are within normal limits.  Visualized tracheal air column  is within normal limits.  No pneumothorax.  No definite pleural effusion.  Pulmonary vascular congestion and crowding markings.  No consolidation. No acute osseous abnormality identified.  IMPRESSION: Lower lung volumes with basilar vascular congestion crowding of markings.  Cardiomegaly which appears new since 2009.   Original Report Authenticated By: Harley Hallmark, M.D.    Dg Abd 2 Views  04/01/2012  *RADIOLOGY REPORT*  Clinical Data: 71 year old female abdominal pain with nausea.  ABDOMEN - 2 VIEW  Comparison: 08/26/2011 and earlier.  Findings: Upright and supine views of the abdomen and pelvis. Prominent small bowel gas in the mid abdomen.  There is some colonic gas present.  No pneumoperitoneum.  Negative visualized lung bases.  Right upper quadrant surgical clips.  Gas in the rectum. No acute osseous abnormality identified.  IMPRESSION: Mildly dilated mid abdominal small bowel loops suspicious for early or partial small bowel obstruction.  No free air.   Original Report Authenticated By: Ulla Potash III, M.D.        1. SBO (small bowel obstruction)       MDM  Discussed with surgery who is coming to see pt.  Given IVFs, pain meds        Rolan Bucco, MD 04/01/12 1419

## 2012-04-01 NOTE — ED Notes (Signed)
Pt's last BM last night, states has been nauseous but denies vomiting.

## 2012-04-01 NOTE — ED Notes (Signed)
Pt states started having abdominal pain last night around 10 pm, had an xray done today at [facility] hospital which showed she had a bowel obstruction, pt was sent here by Dr. Oneta Rack, pt does have a hx of SBO.

## 2012-04-01 NOTE — H&P (Signed)
Brittney Valentine is an 71 y.o. female.   Chief Complaint: Abdominal pain and nausea  HPI: 71 y/o female presented to the ER after progressive abdominal pain and nausea since 10pm last night.  The pain came on suddenly and has not diminished.  She denies any vomiting.  Her last BM was last night between 9-10 and she denies any flatus.  She had a history of a bowel obstruction about 3-4 years ago which resolved with conservative management.  She has had multiple surgeries in the past including a small bowel resection after a small bowel injury during a cholecystectomy.  During that time she was admitted to [facility] for 4 months.    Past Medical History  Diagnosis Date  . Shingles   . Diverticulosis   . Hemorrhoids   . Portal hypertensive gastropathy   . Varices, esophageal   . Obesity   . Cirrhosis   . Zoster   . Partial small bowel obstruction   . Anemia   . Blood transfusion without reported diagnosis   . Heart murmur   . Thyroid disease   . Personal history of colonic polyps 03/25/2012    8 mm rectal adenoma 03/2012    Past Surgical History  Procedure Date  . Esophagogastroduodenoscopy multiple  . Colonoscopy   . Abdominal hysterectomy   . Cholecystectomy 2000  . Appendectomy 1991  . Colon surgery   . Colon surgery     Family History  Problem Relation Age of Onset  . Heart failure Mother     died from  . Heart attack Father     died from   Social History:  reports that she has never smoked. She has never used smokeless tobacco. She reports that she does not drink alcohol or use illicit drugs.  Allergies:  Allergies  Allergen Reactions  . Sulfonamide Derivatives     See little dots, skin feels like its burning     (Not in a hospital admission)  Results for orders placed during the hospital encounter of 04/01/12 (from the past 48 hour(s))  CBC WITH DIFFERENTIAL     Status: Abnormal   Collection Time   04/01/12 12:45 PM      Component Value Range Comment   WBC 5.2   4.0 - 10.5 K/uL    RBC 4.39  3.87 - 5.11 MIL/uL    Hemoglobin 13.5  12.0 - 15.0 g/dL    HCT 45.4  09.8 - 11.9 %    MCV 90.9  78.0 - 100.0 fL    MCH 30.8  26.0 - 34.0 pg    MCHC 33.8  30.0 - 36.0 g/dL    RDW 14.7  82.9 - 56.2 %    Platelets 105 (*) 150 - 400 K/uL PLATELET COUNT CONFIRMED BY SMEAR   Neutrophils Relative 69  43 - 77 %    Lymphocytes Relative 18  12 - 46 %    Monocytes Relative 13 (*) 3 - 12 %    Eosinophils Relative 0  0 - 5 %    Basophils Relative 0  0 - 1 %    Neutro Abs 3.6  1.7 - 7.7 K/uL    Lymphs Abs 0.9  0.7 - 4.0 K/uL    Monocytes Absolute 0.7  0.1 - 1.0 K/uL    Eosinophils Absolute 0.0  0.0 - 0.7 K/uL    Basophils Absolute 0.0  0.0 - 0.1 K/uL    Smear Review PLATELET COUNT CONFIRMED BY SMEAR  COMPREHENSIVE METABOLIC PANEL     Status: Abnormal   Collection Time   04/01/12 12:45 PM      Component Value Range Comment   Sodium 134 (*) 135 - 145 mEq/L    Potassium 4.7  3.5 - 5.1 mEq/L    Chloride 103  96 - 112 mEq/L    CO2 21  19 - 32 mEq/L    Glucose, Bld 102 (*) 70 - 99 mg/dL    BUN 16  6 - 23 mg/dL    Creatinine, Ser 1.61  0.50 - 1.10 mg/dL    Calcium 9.4  8.4 - 09.6 mg/dL    Total Protein 6.8  6.0 - 8.3 g/dL    Albumin 3.3 (*) 3.5 - 5.2 g/dL    AST 69 (*) 0 - 37 U/L    ALT 38 (*) 0 - 35 U/L    Alkaline Phosphatase 82  39 - 117 U/L    Total Bilirubin 0.9  0.3 - 1.2 mg/dL    GFR calc non Af Amer 87 (*) >90 mL/min    GFR calc Af Amer >90  >90 mL/min    Dg Chest 2 View  04/01/2012  *RADIOLOGY REPORT*  Clinical Data: 71 year old female pain.  CHEST - 2 VIEW  Comparison: 12/08/2007.  Findings: Low lung volumes.  Cardiac size increased, to a degree might be accentuated due to lower volumes. Other mediastinal contours are within normal limits.  Visualized tracheal air column is within normal limits.  No pneumothorax.  No definite pleural effusion.  Pulmonary vascular congestion and crowding markings.  No consolidation. No acute osseous abnormality  identified.  IMPRESSION: Lower lung volumes with basilar vascular congestion crowding of markings.  Cardiomegaly which appears new since 2009.   Original Report Authenticated By: Harley Hallmark, M.D.    Dg Abd 2 Views  04/01/2012  *RADIOLOGY REPORT*  Clinical Data: 71 year old female abdominal pain with nausea.  ABDOMEN - 2 VIEW  Comparison: 08/26/2011 and earlier.  Findings: Upright and supine views of the abdomen and pelvis. Prominent small bowel gas in the mid abdomen.  There is some colonic gas present.  No pneumoperitoneum.  Negative visualized lung bases.  Right upper quadrant surgical clips.  Gas in the rectum. No acute osseous abnormality identified.  IMPRESSION: Mildly dilated mid abdominal small bowel loops suspicious for early or partial small bowel obstruction.  No free air.   Original Report Authenticated By: Harley Hallmark, M.D.     Review of Systems  Constitutional: Negative for fever, chills and weight loss.  Respiratory: Negative for cough and wheezing.   Cardiovascular: Negative for chest pain and palpitations.  Gastrointestinal: Positive for nausea, abdominal pain (Pain over umbilicus and epigastric area) and constipation (No BM since 9-10pm on 04/01/12). Negative for vomiting, diarrhea and blood in stool.  Genitourinary: Negative for dysuria.  Skin: Negative for rash.  Neurological: Negative for dizziness and headaches.    Blood pressure 157/67, pulse 72, temperature 98.2 F (36.8 C), temperature source Oral, resp. rate 20, SpO2 97.00%. Physical Exam  Constitutional: She is oriented to person, place, and time. She appears well-developed and well-nourished.  HENT:  Head: Normocephalic and atraumatic.  Eyes: EOM are normal.  Neck: Normal range of motion.  Cardiovascular: Normal rate and regular rhythm.        + murmur   Respiratory: Effort normal and breath sounds normal.  GI: Soft. Bowel sounds are normal. She exhibits distension (mildly). She exhibits no shifting  dullness, no ascites and no  mass. There is no hepatosplenomegaly. There is tenderness (mildly tender over umbilicus and epigastric area) in the epigastric area and periumbilical area. There is no rebound and no guarding.    Neurological: She is alert and oriented to person, place, and time.  Skin: Skin is warm and dry.  Psychiatric: She has a normal mood and affect.     Assessment/Plan 71 y/o female with PSBO -Treat conservatively for now, if not progressing may need surgery -Will need NG tube if she starts vomiting -Pain control -IVF hydration -NPO except few ice chips for dry mouth -Ambulate OOB  DORT, Kea Callan 04/01/2012, 2:33 PM

## 2012-04-01 NOTE — ED Notes (Signed)
States had colonoscopy 2 weeks ago-- one polyp found, BM have been normal, last one was last night, after eating a big meal. States felt uncomfortable after eating thought would feel better after bowel movement, did not have any relief. Went to Dr. Oneta Rack this am, had xray-- sent here for evaluation of SBO- per xray.

## 2012-04-02 ENCOUNTER — Inpatient Hospital Stay (HOSPITAL_COMMUNITY): Payer: Medicare Other

## 2012-04-02 LAB — CBC
MCHC: 33.6 g/dL (ref 30.0–36.0)
Platelets: 111 10*3/uL — ABNORMAL LOW (ref 150–400)
RDW: 14.8 % (ref 11.5–15.5)

## 2012-04-02 LAB — COMPREHENSIVE METABOLIC PANEL
ALT: 35 U/L (ref 0–35)
AST: 58 U/L — ABNORMAL HIGH (ref 0–37)
Albumin: 3 g/dL — ABNORMAL LOW (ref 3.5–5.2)
Alkaline Phosphatase: 81 U/L (ref 39–117)
BUN: 17 mg/dL (ref 6–23)
Potassium: 4.5 mEq/L (ref 3.5–5.1)
Sodium: 139 mEq/L (ref 135–145)
Total Protein: 6.3 g/dL (ref 6.0–8.3)

## 2012-04-02 LAB — PROTIME-INR: Prothrombin Time: 15.1 seconds (ref 11.6–15.2)

## 2012-04-02 MED ORDER — PROMETHAZINE HCL 25 MG/ML IJ SOLN
12.5000 mg | Freq: Once | INTRAMUSCULAR | Status: AC
  Administered 2012-04-02: 12.5 mg via INTRAVENOUS
  Filled 2012-04-02: qty 1

## 2012-04-02 NOTE — Care Management Note (Signed)
    Page 1 of 1   04/05/2012     12:33:24 PM   CARE MANAGEMENT NOTE 04/05/2012  Patient:  Brittney Valentine, Brittney Valentine   Account Number:  [id number]  Date Initiated:  04/02/2012  Documentation initiated by:  Lorenda Ishihara  Subjective/Objective Assessment:   71 yo female admitted with SBO. PTA lived at home alone.     Action/Plan:   d/c home when stable   Anticipated DC Date:  04/05/2012   Anticipated DC Plan:  HOME/SELF CARE      DC Planning Services  CM consult      Choice offered to / List presented to:             Status of service:  Completed, signed off Medicare Important Message given?   (If response is "NO", the following Medicare IM given date fields will be blank) Date Medicare IM given:   Date Additional Medicare IM given:    Discharge Disposition:  HOME/SELF CARE  Per UR Regulation:  Reviewed for med. necessity/level of care/duration of stay  If discussed at Long Length of Stay Meetings, dates discussed:    Comments:

## 2012-04-02 NOTE — Progress Notes (Signed)
Patient ID: Brittney Valentine, female   DOB: 21-Mar-1941, 72 y.o.   MRN: [id number]    Subjective: 71 y/o female doing very well today.  She has been sleeping most of the morning.  Abdominal pain improved after small watery BM today.  Pt is also passing gas.  Pt up ambulating to BR.  Pt denies any nausea/vomiting, bloating, chest pain, SOB, or leg tenderness.    Objective: Vital signs in last 24 hours: Temp:  [98.2 F (36.8 C)-99.4 F (37.4 C)] 98.7 F (37.1 C) (10/25 1400) Pulse Rate:  [73-85] 78  (10/25 1400) Resp:  [16-19] 16  (10/25 1400) BP: (129-166)/(76-87) 159/76 mmHg (10/25 1400) SpO2:  [90 %-98 %] 98 % (10/25 1400) Weight:  [222 lb (100.699 kg)] 222 lb (100.699 kg) (10/24 1813) Last BM Date: 04/02/12  Intake/Output from previous day: 10/24 0701 - 10/25 0700 In: 1333.3 [I.V.:1333.3] Out: 750 [Urine:570; Emesis/NG output:180] Intake/Output this shift: Total I/O In: 800 [I.V.:800] Out: 450 [Urine:450]  PE: Gen:  Alert, NAD, pleasant Card:  RRR, no murmur heard Pulm:  CTA, no wheezing Abd: Soft, NT/ND, +BS Ext:  No erythema, edema, or tenderness   Lab Results:   Basename 04/02/12 0454 04/01/12 1245  WBC 7.5 5.2  HGB 14.0 13.5  HCT 41.7 39.9  PLT 111* 105*   BMET  Basename 04/02/12 0454 04/01/12 1245  NA 139 134*  K 4.5 4.7  CL 108 103  CO2 24 21  GLUCOSE 162* 102*  BUN 17 16  CREATININE 0.76 0.69  CALCIUM 9.0 9.4   PT/INR  Basename 04/02/12 0454  LABPROT 15.1  INR 1.21   CMP     Component Value Date/Time   NA 139 04/02/2012 0454   K 4.5 04/02/2012 0454   CL 108 04/02/2012 0454   CO2 24 04/02/2012 0454   GLUCOSE 162* 04/02/2012 0454   BUN 17 04/02/2012 0454   CREATININE 0.76 04/02/2012 0454   CALCIUM 9.0 04/02/2012 0454   PROT 6.3 04/02/2012 0454   ALBUMIN 3.0* 04/02/2012 0454   AST 58* 04/02/2012 0454   ALT 35 04/02/2012 0454   ALKPHOS 81 04/02/2012 0454   BILITOT 1.4* 04/02/2012 0454   GFRNONAA 85* 04/02/2012 0454   GFRAA >90 04/02/2012  0454   Lipase     Component Value Date/Time   LIPASE 24 12/08/2007 2309       Studies/Results: Dg Chest 2 View  04/01/2012  *RADIOLOGY REPORT*  Clinical Data: 72 year old female pain.  CHEST - 2 VIEW  Comparison: 12/08/2007.  Findings: Low lung volumes.  Cardiac size increased, to a degree might be accentuated due to lower volumes. Other mediastinal contours are within normal limits.  Visualized tracheal air column is within normal limits.  No pneumothorax.  No definite pleural effusion.  Pulmonary vascular congestion and crowding markings.  No consolidation. No acute osseous abnormality identified.  IMPRESSION: Lower lung volumes with basilar vascular congestion crowding of markings.  Cardiomegaly which appears new since 2009.   Original Report Authenticated By: Harley Hallmark, M.D.    Dg Abd 2 Views  04/01/2012  *RADIOLOGY REPORT*  Clinical Data: 71 year old female abdominal pain with nausea.  ABDOMEN - 2 VIEW  Comparison: 08/26/2011 and earlier.  Findings: Upright and supine views of the abdomen and pelvis. Prominent small bowel gas in the mid abdomen.  There is some colonic gas present.  No pneumoperitoneum.  Negative visualized lung bases.  Right upper quadrant surgical clips.  Gas in the rectum. No acute osseous abnormality identified.  IMPRESSION: Mildly dilated mid abdominal small bowel loops suspicious for early or partial small bowel obstruction.  No free air.   Original Report Authenticated By: Harley Hallmark, M.D.    Dg Abd Portable 1v  04/02/2012  *RADIOLOGY REPORT*  Clinical Data: Small bowel obstruction  PORTABLE ABDOMEN - 1 VIEW  Comparison: 04/01/2012  Findings: Small bowel dilatation shows interval improvement.  There is more gas in the rectum on today's study.  Colon is not dilated.  IMPRESSION: Improving small bowel obstruction pattern.   Original Report Authenticated By: Camelia Phenes, M.D.     Anti-infectives: Anti-infectives    None       Assessment/Plan 71 y/o  female with PSBO, now improving -Cont conservative tx, but go slow with diet progression -Will need NG tube if she starts vomiting  -If she starts to worsen will need imaging and possible surgery -NPO except Ice chips okay, may progress to clears tomorrow if doing okay -Ambulate OOB   LOS: 1 day    DORT, Enrica Corliss 04/02/2012, 5:10 PM Pager: [phone]

## 2012-04-03 MED ORDER — HEPARIN SODIUM (PORCINE) 5000 UNIT/ML IJ SOLN
5000.0000 [IU] | Freq: Three times a day (TID) | INTRAMUSCULAR | Status: DC
  Administered 2012-04-03 – 2012-04-04 (×5): 5000 [IU] via SUBCUTANEOUS
  Filled 2012-04-03 (×9): qty 1

## 2012-04-03 NOTE — Progress Notes (Signed)
Patient ID: Brittney Valentine, female   DOB: [birth year], 71 y.o.   MRN: [id number]    Subjective: 71 y/o female doing very well today.    Abdominal pain improved.  Pt is also passing gas and having BM's.  Pt up ambulating to BR.  Pt denies any nausea/vomiting, bloating, chest pain, SOB.    Objective: Vital signs in last 24 hours: Temp:  [98.2 F (36.8 C)-98.7 F (37.1 C)] 98.2 F (36.8 C) (10/26 0600) Pulse Rate:  [67-78] 67  (10/26 0600) Resp:  [16-18] 18  (10/26 0600) BP: (144-159)/(76-83) 148/81 mmHg (10/26 0600) SpO2:  [94 %-98 %] 94 % (10/26 0600) Last BM Date: 04/03/12 (had BM earlier this am)  Intake/Output from previous day: 10/25 0701 - 10/26 0700 In: 2454.3 [I.V.:2453.3] Out: 1250 [Urine:1250] Intake/Output this shift:    PE: Gen:  Alert, NAD, pleasant Card:  RRR, no murmur heard Pulm:  CTA, no wheezing Abd: Soft, NT/ND, +BS Ext:  No erythema, edema, or tenderness   Lab Results:   Basename 04/02/12 0454 04/01/12 1245  WBC 7.5 5.2  HGB 14.0 13.5  HCT 41.7 39.9  PLT 111* 105*   BMET  Basename 04/02/12 0454 04/01/12 1245  NA 139 134*  K 4.5 4.7  CL 108 103  CO2 24 21  GLUCOSE 162* 102*  BUN 17 16  CREATININE 0.76 0.69  CALCIUM 9.0 9.4   PT/INR  Basename 04/02/12 0454  LABPROT 15.1  INR 1.21   CMP     Component Value Date/Time   NA 139 04/02/2012 0454   K 4.5 04/02/2012 0454   CL 108 04/02/2012 0454   CO2 24 04/02/2012 0454   GLUCOSE 162* 04/02/2012 0454   BUN 17 04/02/2012 0454   CREATININE 0.76 04/02/2012 0454   CALCIUM 9.0 04/02/2012 0454   PROT 6.3 04/02/2012 0454   ALBUMIN 3.0* 04/02/2012 0454   AST 58* 04/02/2012 0454   ALT 35 04/02/2012 0454   ALKPHOS 81 04/02/2012 0454   BILITOT 1.4* 04/02/2012 0454   GFRNONAA 85* 04/02/2012 0454   GFRAA >90 04/02/2012 0454   Lipase     Component Value Date/Time   LIPASE 24 12/08/2007 2309       Studies/Results: Dg Chest 2 View  04/01/2012  *RADIOLOGY REPORT*  Clinical Data: 71 year old  female pain.  CHEST - 2 VIEW  Comparison: 12/08/2007.  Findings: Low lung volumes.  Cardiac size increased, to a degree might be accentuated due to lower volumes. Other mediastinal contours are within normal limits.  Visualized tracheal air column is within normal limits.  No pneumothorax.  No definite pleural effusion.  Pulmonary vascular congestion and crowding markings.  No consolidation. No acute osseous abnormality identified.  IMPRESSION: Lower lung volumes with basilar vascular congestion crowding of markings.  Cardiomegaly which appears new since 2009.   Original Report Authenticated By: Harley Hallmark, M.D.    Dg Abd 2 Views  04/01/2012  *RADIOLOGY REPORT*  Clinical Data: 71 year old female abdominal pain with nausea.  ABDOMEN - 2 VIEW  Comparison: 08/26/2011 and earlier.  Findings: Upright and supine views of the abdomen and pelvis. Prominent small bowel gas in the mid abdomen.  There is some colonic gas present.  No pneumoperitoneum.  Negative visualized lung bases.  Right upper quadrant surgical clips.  Gas in the rectum. No acute osseous abnormality identified.  IMPRESSION: Mildly dilated mid abdominal small bowel loops suspicious for early or partial small bowel obstruction.  No free air.   Original Report Authenticated By:  H.LEE Dorina Hoyer, M.D.    Dg Abd Portable 1v  04/02/2012  *RADIOLOGY REPORT*  Clinical Data: Small bowel obstruction  PORTABLE ABDOMEN - 1 VIEW  Comparison: 04/01/2012  Findings: Small bowel dilatation shows interval improvement.  There is more gas in the rectum on today's study.  Colon is not dilated.  IMPRESSION: Improving small bowel obstruction pattern.   Original Report Authenticated By: Camelia Phenes, M.D.     Anti-infectives: Anti-infectives    None       Assessment/Plan 71 y/o female with PSBO, now improving -will advance to full liquids now that pt is having bowel function  -Ambulate OOB - SQ Hep and SCD's for DVT prophylaxis   LOS: 2 days     Vanita Panda. 04/03/2012, 10:31 AM Pager: [phone]

## 2012-04-04 MED ORDER — ZOLPIDEM TARTRATE 5 MG PO TABS
5.0000 mg | ORAL_TABLET | Freq: Once | ORAL | Status: AC
  Administered 2012-04-04: 5 mg via ORAL
  Filled 2012-04-04: qty 1

## 2012-04-04 MED ORDER — LEVOTHYROXINE SODIUM 112 MCG PO TABS
112.0000 ug | ORAL_TABLET | Freq: Every day | ORAL | Status: DC
  Administered 2012-04-05: 112 ug via ORAL
  Filled 2012-04-04 (×3): qty 1

## 2012-04-04 MED ORDER — FLECAINIDE ACETATE 100 MG PO TABS
100.0000 mg | ORAL_TABLET | Freq: Two times a day (BID) | ORAL | Status: DC
  Administered 2012-04-04 – 2012-04-05 (×2): 100 mg via ORAL
  Filled 2012-04-04 (×4): qty 1

## 2012-04-04 NOTE — Progress Notes (Signed)
Patient ID: Brittney Valentine, female   DOB: 06-30-1940, 71 y.o.   MRN: [id number]    Subjective: 71 y/o female doing well today.  Abdominal pain better.  Pt is passing gas and having BM's. Started liquids yesterday.  She states that she got rather bloated after dinner but this resolved after a few hours.  Objective: Vital signs in last 24 hours: Temp:  [97.9 F (36.6 C)-98.8 F (37.1 C)] 97.9 F (36.6 C) (10/27 0545) Pulse Rate:  [63-67] 63  (10/27 0545) Resp:  [16] 16  (10/27 0545) BP: (148-166)/(67-84) 148/72 mmHg (10/27 0545) SpO2:  [96 %-99 %] 99 % (10/27 0545) Last BM Date: 04/03/12  Intake/Output from previous day: 10/26 0701 - 10/27 0700 In: 2615 [P.O.:1140; I.V.:1475] Out: 1600 [Urine:1600] Intake/Output this shift:    PE: Gen:  Alert, NAD, pleasant Card:  RRR Pulm:  No resp distress Abd: Soft, NT/ND, +BS    Lab Results:   Basename 04/02/12 0454 04/01/12 1245  WBC 7.5 5.2  HGB 14.0 13.5  HCT 41.7 39.9  PLT 111* 105*   BMET  Basename 04/02/12 0454 04/01/12 1245  NA 139 134*  K 4.5 4.7  CL 108 103  CO2 24 21  GLUCOSE 162* 102*  BUN 17 16  CREATININE 0.76 0.69  CALCIUM 9.0 9.4   PT/INR  Basename 04/02/12 0454  LABPROT 15.1  INR 1.21   CMP     Component Value Date/Time   NA 139 04/02/2012 0454   K 4.5 04/02/2012 0454   CL 108 04/02/2012 0454   CO2 24 04/02/2012 0454   GLUCOSE 162* 04/02/2012 0454   BUN 17 04/02/2012 0454   CREATININE 0.76 04/02/2012 0454   CALCIUM 9.0 04/02/2012 0454   PROT 6.3 04/02/2012 0454   ALBUMIN 3.0* 04/02/2012 0454   AST 58* 04/02/2012 0454   ALT 35 04/02/2012 0454   ALKPHOS 81 04/02/2012 0454   BILITOT 1.4* 04/02/2012 0454   GFRNONAA 85* 04/02/2012 0454   GFRAA >90 04/02/2012 0454   Lipase     Component Value Date/Time   LIPASE 24 12/08/2007 2309       Studies/Results: Dg Abd Portable 1v  04/02/2012  *RADIOLOGY REPORT*  Clinical Data: Small bowel obstruction  PORTABLE ABDOMEN - 1 VIEW  Comparison:  04/01/2012  Findings: Small bowel dilatation shows interval improvement.  There is more gas in the rectum on today's study.  Colon is not dilated.  IMPRESSION: Improving small bowel obstruction pattern.   Original Report Authenticated By: Camelia Phenes, M.D.     Anti-infectives: Anti-infectives    None       Assessment/Plan 71 y/o female with PSBO, now improving -will advance to reg diet.  Given her bloating after dinner, I would like for her to stay today and make sure her symptoms completely resolve.  D/C in AM if tolerates a diet -Ambulate OOB - SQ Hep and SCD's for DVT prophylaxis   LOS: 3 days    Vanita Panda. 04/04/2012, 8:19 AM Pager: [phone]

## 2012-04-05 MED ORDER — DSS 100 MG PO CAPS: 100.0000 mg | ORAL_CAPSULE | Freq: Two times a day (BID) | ORAL | Status: DC | PRN

## 2012-04-05 MED ORDER — POLYETHYLENE GLYCOL 3350 17 G PO PACK
17.0000 g | PACK | Freq: Every day | ORAL | Status: DC
  Administered 2012-04-05: 17 g via ORAL
  Filled 2012-04-05: qty 1

## 2012-04-05 MED ORDER — DOCUSATE SODIUM 100 MG PO CAPS
100.0000 mg | ORAL_CAPSULE | Freq: Two times a day (BID) | ORAL | Status: DC
  Administered 2012-04-05: 100 mg via ORAL
  Filled 2012-04-05 (×2): qty 1

## 2012-04-05 MED ORDER — POLYETHYLENE GLYCOL 3350 17 G PO PACK: 17.0000 g | PACK | Freq: Every day | ORAL | Status: DC

## 2012-04-05 NOTE — Discharge Instructions (Signed)
Intestinal Obstruction An intestinal obstruction comes from a physical blockage in the bowel. Different problems in the bowel may cause these blockages.  CAUSES   Adhesions from previous surgeries  Cancer or tumor  A hernia, which is a condition in which a portion of the bowel bulges out through an opening or weakness in the abdomen (belly). This sometimes squeezes the bowel.  A swallowed object.  Blockage (impaction) with worms is common in third world countries.  A twisting of the bowel or telescoping of a portion of the bowel into another portion (intussusceptions)  Anything that stops food from going through from the stomach to the anus. SYMPTOMS  Symptoms of bowel obstruction may result in your belly getting bigger (bloating), nausea, vomiting, explosive diarrhea, explosive stool. You may not be able to hear your normal bowel sounds (such as "growling in your stomach"). You may also stop having bowel movements or passing gas. DIAGNOSIS  Usually this condition is diagnosed with a history and an examination. Often, lab studies (blood work) and x-rays may be used to find the cause. TREATMENT  The main treatment of this condition is to rest the intestine (gut). Often times the obstruction may relieve itself and allow the gut to start working again. Think of the gut like a balloon that is blown up (filled with trapped food and water) which has squeezed into a hole or area which it cannot get through.   If the obstruction is complete, an NG tube (nasogastric) is passed through the nose and into the stomach. It is then connected to suction to keep the stomach emptied out. This also helps treat the nausea and vomiting.  If there is an imbalance in the electrolytes, they are corrected with intravenous fluids. These have the proper chemicals in them to correct the problem.  If the reason for the obstruction (blockage) does not get better with conservative (non-surgical) treatment, surgery may  be necessary. Sometimes, surgery is done immediately if your surgeon knows that the problem is not going to get better with conservative treatment. PROGNOSIS  Depending on what the problem is, most of these problems can be treated by your caregivers with good results. Your surgeon will discuss the best course of action to take, with you or your loved ones. FOLLOWING SURGERY Seek immediate medical attention if you have:  Increasing abdominal pain, repeated vomiting, dehydration or fainting.  Severe weakness, chest pain or back pain.  Blood in your vomit, stool or you have tarry stool. Document Released: 08/16/2003 Document Revised: 08/18/2011 Document Reviewed: 01/14/2008 [employer] Patient Information 2013 [employer].  

## 2012-04-05 NOTE — Discharge Summary (Signed)
Physician Discharge Summary  Patient ID: Brittney Valentine MRN: [id number] DOB/AGE: 1940/07/23 71 y.o.  Admit date: 04/01/2012 Discharge date: 04/05/2012  Admitting Diagnosis: Abdominal pain  Discharge Diagnosis Patient Active Problem List   Diagnosis Date Noted  . Personal history of colonic polyps 03/25/2012  . OBESITY 09/27/2009  . SBO 12/31/2007  . CIRRHOSIS 12/31/2007  . PORTAL HYPERTENSION 12/31/2007  recurrent SBO (resolved)  Consultants None  Procedures None  Hospital Course:  71 yr old female who presented to [facility] with abdominal pain and nausea.  Evaluation revealed a SBO.  This has been a recurrent problem for the patient that has resolved well in the past with conservative management.  She was admitted, made NPO and started on IV fluids.  Over the next several days her bowel function began to return and her diet was advanced.  At the time of discharge, she was tolerating her diet well, having no pain, voiding well, and was passing flatus well.  She was sent home on colace prn and miralax prn.  She will follow up with [place] as needed.     Medication List     As of 04/05/2012  9:33 AM    TAKE these medications         BIOFREEZE COLORLESS 4 % Gel   Generic drug: Menthol (Topical Analgesic)   Apply 1 application topically 4 (four) times daily as needed. As needed to painful areas  For shingles      DSS 100 MG Caps   Take 100 mg by mouth 2 (two) times daily as needed for constipation.      flecainide 100 MG tablet   Commonly known as: TAMBOCOR   Take 100 mg by mouth 2 (two) times daily.      levothyroxine 112 MCG tablet   Commonly known as: SYNTHROID, LEVOTHROID   Take 112 mcg by mouth every morning.      polyethylene glycol packet   Commonly known as: MIRALAX / GLYCOLAX   Take 17 g by mouth daily.      pregabalin 50 MG capsule   Commonly known as: LYRICA   Take 50 mg by mouth 2 (two) times daily.             Follow-up Information    Follow up with  [facility] (As needed)    Contact information:   [address] [place] [address] [phone]         Signed: Denny Levy [facility] [phone]  04/05/2012, 9:33 AM

## 2012-04-05 NOTE — Progress Notes (Signed)
Patient ID: Brittney Valentine, female   DOB: Oct 13, 1940, 71 y.o.   MRN: [id number]    Subjective: Pt w/o complaints, lots of flatus but still not having a BM, tolerating diet really well.  Wants to go home.  Objective: Vital signs in last 24 hours: Temp:  [98 F (36.7 C)-98.6 F (37 C)] 98.1 F (36.7 C) (10/28 0550) Pulse Rate:  [60-69] 69  (10/28 0550) Resp:  [16-18] 18  (10/28 0550) BP: (128-146)/(61-77) 146/61 mmHg (10/28 0550) SpO2:  [95 %-97 %] 95 % (10/28 0550) Last BM Date: 04/03/12  Intake/Output from previous day: 10/27 0701 - 10/28 0700 In: 853.3 [P.O.:720; I.V.:133.3] Out: 1900 [Urine:1900] Intake/Output this shift:    PE: Gen:  Alert, NAD, pleasant Card:  RRR Pulm:  No resp distress Abd: Soft, NT/ND, +BS    Lab Results:  No results found for this basename: WBC:2,HGB:2,HCT:2,PLT:2 in the last 72 hours BMET No results found for this basename: NA:2,K:2,CL:2,CO2:2,GLUCOSE:2,BUN:2,CREATININE:2,CALCIUM:2 in the last 72 hours PT/INR No results found for this basename: LABPROT:2,INR:2 in the last 72 hours CMP     Component Value Date/Time   NA 139 04/02/2012 0454   K 4.5 04/02/2012 0454   CL 108 04/02/2012 0454   CO2 24 04/02/2012 0454   GLUCOSE 162* 04/02/2012 0454   BUN 17 04/02/2012 0454   CREATININE 0.76 04/02/2012 0454   CALCIUM 9.0 04/02/2012 0454   PROT 6.3 04/02/2012 0454   ALBUMIN 3.0* 04/02/2012 0454   AST 58* 04/02/2012 0454   ALT 35 04/02/2012 0454   ALKPHOS 81 04/02/2012 0454   BILITOT 1.4* 04/02/2012 0454   GFRNONAA 85* 04/02/2012 0454   GFRAA >90 04/02/2012 0454   Lipase     Component Value Date/Time   LIPASE 24 12/08/2007 2309       Studies/Results: No results found.  Anti-infectives: Anti-infectives    None       Assessment/Plan 1.  Recurrent SBO: resolving well, but no BM yet,   --add colace and miralax this am.  --likely home this afternoon if able to have BM  --OK to d/c home without BM if ok'd by Dr.  Biagio Quint.  --Ambulate OOB  --SQ Hep and SCD's for DVT prophylaxis   LOS: 4 days    Valentine, Brittney 04/05/2012, 7:50 AM   Feels back to normal.  Bowels functioning, abdomen not distended.  Tolerating diet.  Should be okay for discharge to home.

## 2013-05-17 ENCOUNTER — Encounter: Payer: Self-pay | Admitting: Internal Medicine

## 2013-05-24 ENCOUNTER — Ambulatory Visit (INDEPENDENT_AMBULATORY_CARE_PROVIDER_SITE_OTHER): Payer: Medicare Other | Admitting: Emergency Medicine

## 2013-05-24 ENCOUNTER — Encounter: Payer: Self-pay | Admitting: Emergency Medicine

## 2013-05-24 VITALS — BP 144/100 | HR 64 | Temp 98.0°F | Resp 18 | Ht 65.0 in | Wt 230.0 lb

## 2013-05-24 DIAGNOSIS — D229 Melanocytic nevi, unspecified: Secondary | ICD-10-CM

## 2013-05-24 DIAGNOSIS — D239 Other benign neoplasm of skin, unspecified: Secondary | ICD-10-CM

## 2013-05-24 DIAGNOSIS — I10 Essential (primary) hypertension: Secondary | ICD-10-CM

## 2013-05-24 MED ORDER — VERAPAMIL HCL 80 MG PO TABS: 80.0000 mg | ORAL_TABLET | Freq: Three times a day (TID) | ORAL | Status: DC

## 2013-05-24 NOTE — Patient Instructions (Signed)
Wound Care Wound care helps prevent pain and infection.  You may need a tetanus shot if:  You cannot remember when you had your last tetanus shot.  You have never had a tetanus shot.  The injury broke your skin. If you need a tetanus shot and you choose not to have one, you may get tetanus. Sickness from tetanus can be serious. HOME CARE   Only take medicine as told by your doctor.  Clean the wound daily with mild soap and water.  Change any bandages (dressings) as told by your doctor.  Put medicated cream and a bandage on the wound as told by your doctor.  Change the bandage if it gets wet, dirty, or starts to smell.  Take showers. Do not take baths, swim, or do anything that puts your wound under water.  Rest and raise (elevate) the wound until the pain and puffiness (swelling) are better.  Keep all doctor visits as told. GET HELP RIGHT AWAY IF:   Yellowish-white fluid (pus) comes from the wound.  Medicine does not lessen your pain.  There is a red streak going away from the wound.  You have a fever. MAKE SURE YOU:   Understand these instructions.  Will watch your condition.  Will get help right away if you are not doing well or get worse. Document Released: 03/04/2008 Document Revised: 08/18/2011 Document Reviewed: 09/29/2010 [facility] Patient Information 2014 [place], [place]. Hypertension Hypertension is another name for high blood pressure. High blood pressure may mean that your heart needs to work harder to pump blood. Blood pressure consists of two numbers, which includes a higher number over a lower number (example: 110/72). HOME CARE   Make lifestyle changes as told by your doctor. This may include weight loss and exercise.  Take your blood pressure medicine every day.  Limit how much salt you use.  Stop smoking if you smoke.  Do not use drugs.  Talk to your doctor if you are using decongestants or birth control pills. These medicines might make  blood pressure higher.  Females should not drink more than 1 alcoholic drink per day. Males should not drink more than 2 alcoholic drinks per day.  See your doctor as told. GET HELP RIGHT AWAY IF:   You have a blood pressure reading with a top number of 180 or higher.  You get a very bad headache.  You get blurred or changing vision.  You feel confused.  You feel weak, numb, or faint.  You get chest or belly (abdominal) pain.  You throw up (vomit).  You cannot breathe very well. MAKE SURE YOU:   Understand these instructions.  Will watch your condition.  Will get help right away if you are not doing well or get worse. Document Released: 11/12/2007 Document Revised: 08/18/2011 Document Reviewed: 11/12/2007 [facility] Patient Information 2014 [place], [place].

## 2013-05-25 NOTE — Progress Notes (Signed)
Subjective:    Patient ID:  Blas, female    DOB: December 23, 1940, 72 y.o.   MRN: [id number]  HPI Comments: 72 yo female for concerns with BP elevation and palpitation history. She notes palpitations good on Flecanide 1 QD but her BP is staying elevated making her feel more tired and concerned. She wants to D/C Flecanide and try RX for BP and Palpitations.   She recently loss a close cousin to sucide on 12/5 that was unexpected. She is having some mild depression but is trying to deal with it on her own. She denies SI/HI.  She has 3 small skin tags/ moles on her chin, 1 over her right ear, and one in her fat roll on her right side which have grown and itch. She scratches area and then they become inflamed. She notes the one on the ear and side are irritated by her clothes and glasses. She would like to have them all removed.    Current Outpatient Prescriptions on File Prior to Visit  Medication Sig Dispense Refill  . Cholecalciferol (VITAMIN D-3 PO) Take 2,000 Units by mouth daily.      Marland Kitchen levothyroxine (SYNTHROID, LEVOTHROID) 112 MCG tablet Take 112 mcg by mouth every morning.       . furosemide (LASIX) 20 MG tablet Take 20 mg by mouth as needed.      . pregabalin (LYRICA) 50 MG capsule Take 50 mg by mouth 2 (two) times daily.       No current facility-administered medications on file prior to visit.   ALLERGIES Ciprofloxacin and Sulfonamide derivatives  Past Medical History  Diagnosis Date  . Shingles   . Diverticulosis   . Hemorrhoids   . Portal hypertensive gastropathy   . Varices, esophageal   . Obesity   . Cirrhosis   . Zoster   . Partial small bowel obstruction   . Anemia   . Blood transfusion without reported diagnosis   . Heart murmur   . Thyroid disease   . Personal history of colonic polyps 03/25/2012    8 mm rectal adenoma 03/2012  . Hypertension   . Kidney stones   . Paroxysmal atrial fibrillation   . Degenerative disk disease     Review of Systems   Skin: Positive for color change and wound.  All other systems reviewed and are negative.   BP 144/100  Pulse 64  Temp(Src) 98 F (36.7 C) (Temporal)  Resp 18  Ht 5\' 5"  (1.651 m)  Wt 230 lb (104.327 kg)  BMI 38.27 kg/m2     Objective:   Physical Exam  Nursing note and vitals reviewed. Constitutional: She is oriented to person, place, and time. She appears well-developed and well-nourished. No distress.  Obese  HENT:  Head: Normocephalic and atraumatic.  Right Ear: External ear normal.  Left Ear: External ear normal.  Nose: Nose normal.  Mouth/Throat: Oropharynx is clear and moist.  Eyes: Conjunctivae and EOM are normal.  Neck: Normal range of motion. Neck supple. No JVD present. No thyromegaly present.  Cardiovascular: Normal rate, regular rhythm, normal heart sounds and intact distal pulses.   Pulmonary/Chest: Effort normal and breath sounds normal.  Abdominal: Soft. Bowel sounds are normal. She exhibits no distension. There is no tenderness.  Musculoskeletal: Normal range of motion. She exhibits no edema and no tenderness.  Lymphadenopathy:    She has no cervical adenopathy.  Neurological: She is alert and oriented to person, place, and time. No cranial nerve deficit.  Skin:  Skin is warm and dry. No rash noted. There is erythema. No pallor.  Left chin with 2 small scaling elevations with erythema at base approx 3mm each. Right cheek with 4-5 mm scaling elevation with erythema at base. Right Ear 2 mm scaling elevation with erythema at base  Right lateral abdomen scaling elevations with erythema at base approx 5mm  Psychiatric: She has a normal mood and affect. Her behavior is normal. Judgment and thought content normal.  Occasionally tearful          Assessment & Plan:  1. HTN with history of pal[itations- Add Verapamil 80 mg start QD x 3 days then increase to BID. Decrease Flecanide to 1/2 x 3 days and then D/C unless any symptoms increase, call with results. 2.  Mild depression- Advised counseling, increase activity. W/C if desires RX 3. Atypical Nevi/ irritated skin tags removal with Cryosurgery/ electrocautery. Area prepped with alcohol, 1cc of 1% lidocaine used at each cheek and abdomen locations only and electrocautery performed. Remaining lesions received Cryosurgery treatment with multiple applications to each area.  Hygiene explained for wound care. Neosporin and band-aid applied.

## 2013-05-30 ENCOUNTER — Telehealth: Payer: Self-pay | Admitting: Internal Medicine

## 2013-05-30 NOTE — Telephone Encounter (Signed)
Patient called with c/o negative side effects with Verapamil Rx.  Feels "faint, increased palpitations"  Patient does not want to continue using Rx d/t side effects.  Per Loree Fee, PA-C advised patient to restart Flecanide Rx as directed at previous dose .  Per Loree Fee, PA-C d/c Verapamil Rx and call with results of how she is feeling.  Patient to call if symptoms increase. '

## 2013-07-04 ENCOUNTER — Encounter: Payer: Self-pay | Admitting: Physician Assistant

## 2013-07-04 ENCOUNTER — Ambulatory Visit (INDEPENDENT_AMBULATORY_CARE_PROVIDER_SITE_OTHER): Payer: Medicare Other | Admitting: Physician Assistant

## 2013-07-04 VITALS — BP 132/70 | HR 64 | Temp 98.6°F | Resp 16 | Ht 65.0 in | Wt 232.0 lb

## 2013-07-04 DIAGNOSIS — J209 Acute bronchitis, unspecified: Secondary | ICD-10-CM

## 2013-07-04 MED ORDER — IPRATROPIUM-ALBUTEROL 0.5-2.5 (3) MG/3ML IN SOLN
3.0000 mL | Freq: Once | RESPIRATORY_TRACT | Status: AC
  Administered 2013-07-04: 3 mL via RESPIRATORY_TRACT

## 2013-07-04 MED ORDER — ALBUTEROL SULFATE HFA 108 (90 BASE) MCG/ACT IN AERS: 2.0000 | INHALATION_SPRAY | Freq: Four times a day (QID) | RESPIRATORY_TRACT | Status: DC | PRN

## 2013-07-04 MED ORDER — PREDNISONE 20 MG PO TABS: ORAL_TABLET | ORAL | Status: DC

## 2013-07-04 MED ORDER — AZITHROMYCIN 250 MG PO TABS: ORAL_TABLET | ORAL | Status: DC

## 2013-07-04 NOTE — Patient Instructions (Addendum)

## 2013-07-04 NOTE — Progress Notes (Signed)
   Subjective:    Patient ID: Brittney Valentine, female    DOB: 1940-07-26, 73 y.o.   MRN: [id number]  Cough This is a new problem. The current episode started in the past 7 days. The problem has been gradually improving. The cough is productive of purulent sputum. Associated symptoms include nasal congestion, postnasal drip and wheezing. Pertinent negatives include no chest pain, chills, ear congestion, ear pain, fever, headaches, heartburn, hemoptysis, myalgias, rash, rhinorrhea, sore throat, shortness of breath, sweats or weight loss. Treatments tried: mucinex.   Current Outpatient Prescriptions on File Prior to Visit  Medication Sig Dispense Refill  . Cholecalciferol (VITAMIN D-3 PO) Take 2,000 Units by mouth daily.      . furosemide (LASIX) 20 MG tablet Take 20 mg by mouth as needed.      Marland Kitchen levothyroxine (SYNTHROID, LEVOTHROID) 112 MCG tablet Take 112 mcg by mouth every morning.        No current facility-administered medications on file prior to visit.   Past Medical History  Diagnosis Date  . Shingles   . Diverticulosis   . Hemorrhoids   . Portal hypertensive gastropathy   . Varices, esophageal   . Obesity   . Cirrhosis   . Zoster   . Partial small bowel obstruction   . Anemia   . Blood transfusion without reported diagnosis   . Heart murmur   . Thyroid disease   . Personal history of colonic polyps 03/25/2012    8 mm rectal adenoma 03/2012  . Hypertension   . Kidney stones   . Paroxysmal atrial fibrillation   . Degenerative disk disease       Review of Systems  Constitutional: Negative for fever, chills and weight loss.  HENT: Positive for postnasal drip. Negative for ear pain, rhinorrhea and sore throat.   Respiratory: Positive for cough and wheezing. Negative for hemoptysis and shortness of breath.   Cardiovascular: Negative for chest pain.  Gastrointestinal: Negative for heartburn.  Musculoskeletal: Negative for myalgias.  Skin: Negative for rash.   Neurological: Negative for headaches.      Objective:   Physical Exam  Constitutional: She is oriented to person, place, and time. She appears well-developed and well-nourished.  HENT:  Head: Normocephalic and atraumatic.  Right Ear: External ear normal.  Left Ear: External ear normal.  Nose: Nose normal.  Mouth/Throat: Oropharynx is clear and moist.  Eyes: Conjunctivae are normal. Pupils are equal, round, and reactive to light.  Neck: Normal range of motion. Neck supple.  Cardiovascular: Normal rate and regular rhythm.   Pulmonary/Chest: Effort normal. No respiratory distress. She has wheezes (expiratory). She has no rales. She exhibits no tenderness.  Abdominal: Soft. Bowel sounds are normal.  Lymphadenopathy:    She has no cervical adenopathy.  Neurological: She is alert and oriented to person, place, and time.  Skin: Skin is warm and dry.      Assessment & Plan:  Acute bronchitis - Plan: azithromycin (ZITHROMAX) 250 MG tablet, ipratropium-albuterol (DUONEB) 0.5-2.5 (3) MG/3ML nebulizer solution 3 mL, albuterol (PROVENTIL HFA;VENTOLIN HFA) 108 (90 BASE) MCG/ACT inhaler  Patient feels better and sounds better after Duoneb in the office.  If worse go to the ER

## 2013-11-04 ENCOUNTER — Ambulatory Visit (INDEPENDENT_AMBULATORY_CARE_PROVIDER_SITE_OTHER): Payer: Medicare Other | Admitting: Emergency Medicine

## 2013-11-04 VITALS — BP 138/84 | HR 70 | Temp 98.4°F | Resp 16 | Ht 66.0 in | Wt 242.0 lb

## 2013-11-04 DIAGNOSIS — J018 Other acute sinusitis: Secondary | ICD-10-CM

## 2013-11-04 DIAGNOSIS — H6590 Unspecified nonsuppurative otitis media, unspecified ear: Secondary | ICD-10-CM

## 2013-11-04 DIAGNOSIS — J209 Acute bronchitis, unspecified: Secondary | ICD-10-CM

## 2013-11-04 MED ORDER — PROMETHAZINE-CODEINE 6.25-10 MG/5ML PO SYRP: 5.0000 mL | ORAL_SOLUTION | Freq: Four times a day (QID) | ORAL | Status: DC | PRN

## 2013-11-04 MED ORDER — AMOXICILLIN-POT CLAVULANATE 875-125 MG PO TABS: 1.0000 | ORAL_TABLET | Freq: Two times a day (BID) | ORAL | Status: DC

## 2013-11-04 NOTE — Progress Notes (Signed)
[facility] [address] [place] [address] [phone]  Date:  11/04/2013   Name:  ADAN BEAL   DOB:  1941-02-18   MRN:  [id number]  PCP:  Alesia Richards, MD    Chief Complaint: Cough, Nasal Congestion and Otalgia   History of Present Illness:  Brittney Valentine is a 73 y.o. very pleasant female patient who presents with the following:  Ill for a week with nasal congestion and pressure in ears.  Has purulent nasal congestion and post nasal drainage.  Now has a cough productive of purulent sputum.  Has been wheezing but no shortness of breath.  No nausea or vomiting. No stool change or rash. No history of asthma or reactive airway disease.  Cough worse at night.  Has trouble hearing due to pressure in ears.  No improvement with over the counter medications or other home remedies. Denies other complaint or health concern today.   Patient Active Problem List   Diagnosis Date Noted  . Personal history of colonic polyps 03/25/2012  . OBESITY 09/27/2009  . SBO 12/31/2007  . CIRRHOSIS 12/31/2007  . PORTAL HYPERTENSION 12/31/2007    Past Medical History  Diagnosis Date  . Shingles   . Diverticulosis   . Hemorrhoids   . Portal hypertensive gastropathy   . Varices, esophageal   . Obesity   . Cirrhosis   . Zoster   . Partial small bowel obstruction   . Anemia   . Blood transfusion without reported diagnosis   . Heart murmur   . Thyroid disease   . Personal history of colonic polyps 03/25/2012    8 mm rectal adenoma 03/2012  . Hypertension   . Kidney stones   . Paroxysmal atrial fibrillation   . Degenerative disk disease     Past Surgical History  Procedure Laterality Date  . Esophagogastroduodenoscopy  multiple  . Colonoscopy    . Abdominal hysterectomy    . Cholecystectomy  2000  . Appendectomy  1991  . Colon surgery    . Colon surgery      History  Substance Use Topics  . Smoking status: Never Smoker   . Smokeless tobacco:  Never Used  . Alcohol Use: No    Family History  Problem Relation Age of Onset  . Heart failure Mother     died from  . Hypertension Mother   . Heart attack Father     died from  . Hypertension Sister     Allergies  Allergen Reactions  . Ciprofloxacin     Swelling  . Sulfonamide Derivatives     See little dots, skin feels like its burning    Medication list has been reviewed and updated.  Current Outpatient Prescriptions on File Prior to Visit  Medication Sig Dispense Refill  . flecainide (TAMBOCOR) 100 MG tablet Take 100 mg by mouth 2 (two) times daily.      Marland Kitchen levothyroxine (SYNTHROID, LEVOTHROID) 112 MCG tablet Take 112 mcg by mouth every morning.       Marland Kitchen albuterol (PROVENTIL HFA;VENTOLIN HFA) 108 (90 BASE) MCG/ACT inhaler Inhale 2 puffs into the lungs every 6 (six) hours as needed for wheezing or shortness of breath.  1 Inhaler  2   No current facility-administered medications on file prior to visit.    Review of Systems:  As per HPI, otherwise negative.    Physical Examination: Filed Vitals:   11/04/13 1622  BP: 138/84  Pulse: 70  Temp: 98.4  F (36.9 C)  Resp: 16   Filed Vitals:   11/04/13 1622  Height: 5\' 6"  (1.676 m)  Weight: 242 lb (109.77 kg)   Body mass index is 39.08 kg/(m^2). Ideal Body Weight: Weight in (lb) to have BMI = 25: 154.6  GEN: WDWN, NAD, Non-toxic, A & O x 3  Persistent cough. HEENT: Atraumatic, Normocephalic. Neck supple. No masses, No LAD. Ears and Nose: No external deformity.  Bilateral serous otitis media CV: RRR, No M/G/R. No JVD. No thrill. No extra heart sounds. PULM: CTA B, no wheezes, crackles, rhonchi. No retractions. No resp. distress. No accessory muscle use. ABD: S, NT, ND, +BS. No rebound. No HSM. EXTR: No c/c/e NEURO Normal gait.  PSYCH: Normally interactive. Conversant. Not depressed or anxious appearing.  Calm demeanor.    Assessment and Plan: Serous otitis media Sinusitis Bronchitis augmentin Phen c  cod  Signed,  Ellison Carwin, MD

## 2013-11-04 NOTE — Patient Instructions (Signed)

## 2013-11-08 ENCOUNTER — Other Ambulatory Visit: Payer: Self-pay | Admitting: *Deleted

## 2013-11-08 MED ORDER — LEVOTHYROXINE SODIUM 112 MCG PO TABS: 112.0000 ug | ORAL_TABLET | Freq: Every morning | ORAL | Status: DC

## 2013-11-08 MED ORDER — FLECAINIDE ACETATE 100 MG PO TABS: 100.0000 mg | ORAL_TABLET | Freq: Two times a day (BID) | ORAL | Status: DC

## 2013-12-19 ENCOUNTER — Ambulatory Visit (INDEPENDENT_AMBULATORY_CARE_PROVIDER_SITE_OTHER): Payer: Medicare Other | Admitting: Emergency Medicine

## 2013-12-19 ENCOUNTER — Encounter: Payer: Self-pay | Admitting: Emergency Medicine

## 2013-12-19 VITALS — BP 158/84 | HR 56 | Temp 97.8°F | Resp 18 | Ht 65.75 in | Wt 229.0 lb

## 2013-12-19 DIAGNOSIS — Z1212 Encounter for screening for malignant neoplasm of rectum: Secondary | ICD-10-CM

## 2013-12-19 DIAGNOSIS — E669 Obesity, unspecified: Secondary | ICD-10-CM

## 2013-12-19 DIAGNOSIS — E538 Deficiency of other specified B group vitamins: Secondary | ICD-10-CM

## 2013-12-19 DIAGNOSIS — Z0000 Encounter for general adult medical examination without abnormal findings: Secondary | ICD-10-CM

## 2013-12-19 DIAGNOSIS — R7309 Other abnormal glucose: Secondary | ICD-10-CM

## 2013-12-19 DIAGNOSIS — Z23 Encounter for immunization: Secondary | ICD-10-CM

## 2013-12-19 DIAGNOSIS — R239 Unspecified skin changes: Secondary | ICD-10-CM

## 2013-12-19 DIAGNOSIS — Z1331 Encounter for screening for depression: Secondary | ICD-10-CM

## 2013-12-19 DIAGNOSIS — I10 Essential (primary) hypertension: Secondary | ICD-10-CM

## 2013-12-19 DIAGNOSIS — E782 Mixed hyperlipidemia: Secondary | ICD-10-CM

## 2013-12-19 DIAGNOSIS — Z789 Other specified health status: Secondary | ICD-10-CM

## 2013-12-19 DIAGNOSIS — E559 Vitamin D deficiency, unspecified: Secondary | ICD-10-CM

## 2013-12-19 DIAGNOSIS — R5383 Other fatigue: Secondary | ICD-10-CM

## 2013-12-19 DIAGNOSIS — R5381 Other malaise: Secondary | ICD-10-CM

## 2013-12-19 DIAGNOSIS — R300 Dysuria: Secondary | ICD-10-CM

## 2013-12-19 DIAGNOSIS — Z780 Asymptomatic menopausal state: Secondary | ICD-10-CM

## 2013-12-19 LAB — CBC WITH DIFFERENTIAL/PLATELET
BASOS ABS: 0 10*3/uL (ref 0.0–0.1)
Basophils Relative: 0 % (ref 0–1)
Eosinophils Absolute: 0.1 10*3/uL (ref 0.0–0.7)
Eosinophils Relative: 2 % (ref 0–5)
HCT: 41.8 % (ref 36.0–46.0)
Hemoglobin: 14.5 g/dL (ref 12.0–15.0)
LYMPHS ABS: 1 10*3/uL (ref 0.7–4.0)
LYMPHS PCT: 34 % (ref 12–46)
MCH: 31 pg (ref 26.0–34.0)
MCHC: 34.7 g/dL (ref 30.0–36.0)
MCV: 89.5 fL (ref 78.0–100.0)
Monocytes Absolute: 0.4 10*3/uL (ref 0.1–1.0)
Monocytes Relative: 13 % — ABNORMAL HIGH (ref 3–12)
NEUTROS ABS: 1.5 10*3/uL — AB (ref 1.7–7.7)
NEUTROS PCT: 51 % (ref 43–77)
PLATELETS: 117 10*3/uL — AB (ref 150–400)
RBC: 4.67 MIL/uL (ref 3.87–5.11)
RDW: 15.9 % — AB (ref 11.5–15.5)
WBC: 2.9 10*3/uL — AB (ref 4.0–10.5)

## 2013-12-19 LAB — HEMOGLOBIN A1C
HEMOGLOBIN A1C: 5.5 % (ref <5.7)
MEAN PLASMA GLUCOSE: 111 mg/dL (ref <117)

## 2013-12-19 NOTE — Progress Notes (Signed)
Patient ID: Brittney Valentine, female   DOB: 09/14/1940, 73 y.o.   MRN: [id number] MEDICARE ANNUAL WELLNESS VISIT AND CPE  Assessment:  1. CPE/ MEDICARE WELLNESS UPDATE- Update screening labs/ History/ Immunizations/ Testing as needed. Advised healthy diet, QD exercise, increase H20 and continue RX/ Vitamins AD.   2. F/U for HTN, Cholesterol, Pre-Dm, D. Deficient. Needs healthy diet, cardio QD and obtain healthy weight. Check Labs, Check BP if >130/80 call office   3. Obesity- Continue weight loss, increase activity and better diet. Pt aware of risks. Check labs   4. Anemia vs sleep apnea vs Fatigue- check labs, increase activity and H2O, schedule sleep study  5. Dysuria- check labs, hygiene explained, increase H2o  6. Irreg skin change ? SCC nose- ref derm  7. Edema- elevate legs TID, increase activity, increase H2o, decrease sodium intake, Wear compression socks more routinely if available. W/c if no change with symptoms. Check labs.    Plan:   During the course of the visit the patient was educated and counseled about appropriate screening and preventive services including:    Pneumococcal vaccine   Influenza vaccine  Td vaccine  Screening electrocardiogram  Screening mammography  Bone densitometry screening  Colorectal cancer screening  Diabetes screening  Glaucoma screening  Nutrition counseling   Advanced directives: given information/requested  Screening recommendations, referrals:  Vaccinations: Tdap vaccine not indicated Influenza vaccine not indicated Pneumococcal vaccine ordered Shingles vaccine not indicated Hep B vaccine not indicated  Nutrition assessed and recommended  Colonoscopy not indicated Mammogram not indicated Pap smear not indicated Pelvic exam not indicated Recommended yearly ophthalmology/optometry visit for glaucoma screening and checkup Recommended yearly dental visit for hygiene and checkup Advanced directives -  declined  Conditions/risks identified: BMI: Discussed weight loss, diet, and increase physical activity.  Increase physical activity: AHA recommends 150 minutes of physical activity a week.  Medications reviewed DEXA- ordered Diabetes at goal, ACE/ARB therapy No, Reason not on Ace Inhibitor/ARB therapy:  n/a Urinary Incontinence is not an issue: discussed non pharmacology and pharmacology options.  Fall risk: low- discussed PT, home fall assessment, medications.   Subjective:   Brittney Valentine is a 73 y.o. female who presents for Medicare Annual Wellness Visit and complete physical.    Date of last medicare wellness visit is unknown.  She has been eating healthier and has lost 15 # since last OV. She is increasing activity and has not been able to take HTN RX in past. She notes mild LE edema on/off.   She has had dysuria on/off over 1 week. She denies any other UTI symptoms.   She has been more fatigued lately with difficulty staying asleep. She denies any changes to affect sleep.  She has had elevated blood pressure since 2008. Her blood pressure has been controlled at home, today their BP is BP: 158/84 mmHg She does not workout. She denies chest pain, shortness of breath, dizziness.  She is not on cholesterol medication and denies myalgias. Her cholesterol is at goal. The cholesterol last visit was:   Lab Results  Component Value Date   CHOL 127 12/19/2013  T 129 TG 63 H 50 L 66  She has been working on diet and exercise for prediabetes, and denies weight loss. Last A1C in the office was:  Lab Results  Component Value Date   HGBA1C 5.5 12/19/2013  a1C 5.4 Patient is on Vitamin D supplement.   Lab Results  Component Value Date   VD25OH 39 12/19/2013  d 34  Names of Other Physician/Practitioners you currently use: Patient Care Team: Unk Pinto, MD as PCP - General (Internal Medicine) Eulis Manly. Gershon Crane, MD as Consulting Physician (Ophthalmology) Gatha Mayer, MD as  Consulting Physician (Gastroenterology) Placido Sou, MD as Consulting Physician (Nephrology)   Medication Review Current Outpatient Prescriptions on File Prior to Visit  Medication Sig Dispense Refill  . flecainide (TAMBOCOR) 100 MG tablet Take 1 tablet (100 mg total) by mouth 2 (two) times daily.  180 tablet  4  . levothyroxine (SYNTHROID, LEVOTHROID) 112 MCG tablet Take 1 tablet (112 mcg total) by mouth every morning.  90 tablet  4   No current facility-administered medications on file prior to visit.    Current Problems (verified) Patient Active Problem List   Diagnosis Date Noted  . Personal history of colonic polyps 03/25/2012  . OBESITY 09/27/2009  . SBO 12/31/2007  . CIRRHOSIS 12/31/2007  . PORTAL HYPERTENSION 12/31/2007    Screening Tests Health Maintenance  Topic Date Due  . Mammogram  05/11/1993  . Pneumococcal Polysaccharide Vaccine Age 34 And Over  05/11/2008  . Influenza Vaccine  01/07/2014  . Tetanus/tdap  06/09/2014  . Colonoscopy  03/19/2017  . Zostavax  Completed    Immunization History  Administered Date(s) Administered  . Influenza Whole 03/09/2012  . Pneumococcal Conjugate-13 12/19/2013  . Pneumococcal Polysaccharide-23 06/09/2004  . Td 06/09/2004  . 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 Smoking status: Never Smoker   .  Smokeless tobacco: Never Used  . Alcohol Use: No   She does not smoke.  Patient is not a former smoker. Are there smokers in your home (other than you)?  No  Alcohol Current alcohol use: none  Caffeine Current caffeine use: denies use  Exercise  Current exercise: gardening, housecleaning and walking  Nutrition/Diet Current diet: in general, a "healthy" diet    Cardiac risk factors: advanced age (older than 110 for men, 43 for women), hypertension and obesity (BMI >= 30 kg/m2).  Depression Screen (Note: if answer to either of the following is "Yes", a more complete depression screening is indicated)   Q1: Over the past two weeks, have you felt down, depressed or hopeless? No  Q2: Over the past two weeks, have you felt little interest or pleasure in doing things? No  Have you lost interest or pleasure in daily life? No  Do you often feel hopeless? No  Do you cry easily over simple problems? No  Activities of Daily Living In your present state of health, do you have any difficulty performing the following activities?:  Driving? No Managing money?  No Feeding yourself? No Getting from bed to chair? No Climbing a flight of stairs? No Preparing food and eating?: No Bathing or showering? No Getting dressed: No Getting to the toilet? No Using the toilet:No Moving around from place to place: No In the past year have you fallen or had a near fall?:No   Are you  sexually active?  No  Do you have more than one partner?  No  Vision Difficulties: No  Hearing Difficulties: Yes, if does not wear hearing aides Do you often ask people to speak up or repeat themselves? Yes Do you experience ringing or noises in your ears? No Do you have difficulty understanding soft or whispered voices? Yes  Cognition  Do you feel that you have a problem with memory?No  Do you often misplace items? No  Do you feel safe at home?  Yes  Advanced directives Does patient have a [place]? Yes Does patient have a Living Will? Yes   Objective:     Blood pressure 158/84, pulse 56, temperature 97.8 F (36.6 C), temperature source Temporal, resp. rate 18, height 5' 5.75" (1.67 m), weight 229 lb (103.874 kg). Body mass index is 37.25 kg/(m^2).  General appearance: OBESE, alert, no distress, WD/WN,  female Cognitive Testing  Alert? Yes  Normal Appearance?Yes  Oriented to person? Yes  Place? Yes   Time? Yes  Recall of three objects?  Yes  Can perform simple calculations? Yes  Displays appropriate judgment?Yes  Can read the correct time from a watch face?Yes  HEENT: normocephalic, sclerae anicteric, TMs pearly, nares patent, no discharge or erythema, pharynx normal, Large tongue partially obstructs airway Oral cavity: MMM, no lesions Neck: supple, no lymphadenopathy, no thyromegaly, no masses Heart: RRR, normal S1, S2, no murmurs, 2 + edema bil LE Lungs: CTA bilaterally, no wheezes, rhonchi, or rales Abdomen: +bs, soft, non tender, non distended, no masses, no hepatomegaly, no splenomegaly Musculoskeletal: nontender, no swelling, no obvious deformity Extremities: no edema, no cyanosis, no clubbing Pulses: 2+ symmetric, upper and lower extremities, normal cap refill Neurological: alert, oriented x 3, CN2-12 intact, strength normal upper extremities and lower extremities, sensation normal throughout, DTRs 2+ throughout, no cerebellar signs, gait normal SKIN: Nasal bridge 3 mm elevated scaling erythematous papule. Psychiatric: normal affect, behavior normal, pleasant  Breast: nontender, no masses or lumps, no skin changes, no nipple discharge or inversion, no axillary lymphadenopathy Gyn: defer 2019 Rectal: defer to hemocults  AORTA SCAN WNL EKG NSCSPT   Medicare Attestation I have personally reviewed: The patient's medical and social history Their use of alcohol, tobacco or illicit drugs Their current medications and supplements The patient's functional  ability including ADLs,fall risks, home safety risks, cognitive, and hearing and visual impairment Diet and physical activities Evidence for depression or mood disorders  The patient's weight, height, BMI, and visual acuity have been recorded in the chart.  I have made referrals, counseling, and provided education to the patient based on review of the above and I have provided the patient with a written personalized care plan for preventive services.     Kelby Aline, R, PA-C   12/22/2013

## 2013-12-19 NOTE — Patient Instructions (Addendum)
Sleep Apnea Sleep apnea is disorder that affects a person's sleep. A person with sleep apnea has abnormal pauses in their breathing when they sleep. It is hard for them to get a good sleep. This makes a person tired during the day. It also can lead to other physical problems. There are three types of sleep apnea. One type is when breathing stops for a short time because your airway is blocked (obstructive sleep apnea). Another type is when the brain sometimes fails to give the normal signal to breathe to the muscles that control your breathing (central sleep apnea). The third type is a combination of the other two types. HOME CARE  Do not sleep on your back. Try to sleep on your side.  Take all medicine as told by your doctor.  Avoid alcohol, calming medicines (sedatives), and depressant drugs.  Try to lose weight if you are overweight. Talk to your doctor about a healthy weight goal. Your doctor may have you use a device that helps to open your airway. It can help you get the air that you need. It is called a positive airway pressure (PAP) device. There are three types of PAP devices:  Continuous positive airway pressure (CPAP) device.  Nasal expiratory positive airway pressure (EPAP) device.  Bilevel positive airway pressure (BPAP) device. MAKE SURE YOU:  Understand these instructions.  Will watch your condition.  Will get help right away if you are not doing well or get worse. Document Released: 03/04/2008 Document Revised: 05/12/2012 Document Reviewed: 09/27/2011 [facility] Patient Information 2015 [employer] [place], [place]. This information is not intended to replace advice given to you by your health care provider. Make sure you discuss any questions you have with your health care provider.  Pneumococcal Vaccine, Polyvalent suspension for injection What is this medicine? PNEUMOCOCCAL VACCINE, POLYVALENT (NEU mo KOK al vak SEEN, pol ee VEY luhnt) is a vaccine to prevent pneumococcus bacteria  infection. These bacteria are a major cause of ear infections, 'Strep throat' infections, and serious pneumonia, meningitis, or blood infections worldwide. These vaccines help the body to produce antibodies (protective substances) that help your body defend against these bacteria. This vaccine is recommended for infants and young children. This vaccine will not treat an infection. This medicine may be used for other purposes; ask your health care provider or pharmacist if you have questions. COMMON BRAND NAME(S): Prevnar 13 What should I tell my health care provider before I take this medicine? They need to know if you have any of these conditions: -bleeding problems -fever -immune system problems -low platelet count in the blood -seizures -an unusual or allergic reaction to pneumococcal vaccine, diphtheria toxoid, other vaccines, latex, other medicines, foods, dyes, or preservatives -pregnant or trying to get pregnant -breast-feeding How should I use this medicine? This vaccine is for injection into a muscle. It is given by a health care professional. A copy of Vaccine Information Statements will be given before each vaccination. Read this sheet carefully each time. The sheet may change frequently. Talk to your pediatrician regarding the use of this medicine in children. While this drug may be prescribed for children as young as 71 weeks old for selected conditions, precautions do apply. Overdosage: If you think you have taken too much of this medicine contact a poison control center or emergency room at once. NOTE: This medicine is only for you. Do not share this medicine with others. What if I miss a dose? It is important not to miss your dose. Call your doctor  or health care professional if you are unable to keep an appointment. What may interact with this medicine? -medicines for cancer chemotherapy -medicines that suppress your immune function -medicines that treat or prevent blood clots  like warfarin, enoxaparin, and dalteparin -steroid medicines like prednisone or cortisone This list may not describe all possible interactions. Give your health care provider a list of all the medicines, herbs, non-prescription drugs, or dietary supplements you use. Also tell them if you smoke, drink alcohol, or use illegal drugs. Some items may interact with your medicine. What should I watch for while using this medicine? Mild fever and pain should go away in 3 days or less. Report any unusual symptoms to your doctor or health care professional. What side effects may I notice from receiving this medicine? Side effects that you should report to your doctor or health care professional as soon as possible: -allergic reactions like skin rash, itching or hives, swelling of the face, lips, or tongue -breathing problems -confused -fever over 102 degrees F -pain, tingling, numbness in the hands or feet -seizures -unusual bleeding or bruising -unusual muscle weakness Side effects that usually do not require medical attention (report to your doctor or health care professional if they continue or are bothersome): -aches and pains -diarrhea -fever of 102 degrees F or less -headache -irritable -loss of appetite -pain, tender at site where injected -trouble sleeping This list may not describe all possible side effects. Call your doctor for medical advice about side effects. You may report side effects to FDA at 1-800-FDA-1088. Where should I keep my medicine? This does not apply. This vaccine is given in a clinic, pharmacy, doctor's office, or other health care setting and will not be stored at home. NOTE: This sheet is a summary. It may not cover all possible information. If you have questions about this medicine, talk to your doctor, pharmacist, or health care provider.  2015, [employer] Standard. (2008-08-08 10:17:22)

## 2013-12-20 LAB — MAGNESIUM: Magnesium: 1.9 mg/dL (ref 1.5–2.5)

## 2013-12-20 LAB — URINALYSIS, MICROSCOPIC ONLY
Casts: NONE SEEN
Crystals: NONE SEEN
Squamous Epithelial / LPF: NONE SEEN

## 2013-12-20 LAB — URINE CULTURE: Colony Count: 30000

## 2013-12-20 LAB — INSULIN, FASTING: INSULIN FASTING, SERUM: 31 u[IU]/mL — AB (ref 3–28)

## 2013-12-20 LAB — LIPID PANEL
Cholesterol: 127 mg/dL (ref 0–200)
HDL: 52 mg/dL (ref >39)
LDL CALC: 63 mg/dL (ref 0–99)
TRIGLYCERIDES: 61 mg/dL (ref <150)
Total CHOL/HDL Ratio: 2.4 Ratio
VLDL: 12 mg/dL (ref 0–40)

## 2013-12-20 LAB — BASIC METABOLIC PANEL WITH GFR
BUN: 10 mg/dL (ref 6–23)
CHLORIDE: 107 meq/L (ref 96–112)
CO2: 26 meq/L (ref 19–32)
CREATININE: 0.75 mg/dL (ref 0.50–1.10)
Calcium: 9.4 mg/dL (ref 8.4–10.5)
GFR, Est African American: >89 mL/min
GFR, Est Non African American: 81 mL/min
Glucose, Bld: 98 mg/dL (ref 70–99)
POTASSIUM: 4.2 meq/L (ref 3.5–5.3)
SODIUM: 141 meq/L (ref 135–145)

## 2013-12-20 LAB — VITAMIN B12: VITAMIN B 12: 837 pg/mL (ref 211–911)

## 2013-12-20 LAB — URINALYSIS, ROUTINE W REFLEX MICROSCOPIC
Bilirubin Urine: NEGATIVE
GLUCOSE, UA: NEGATIVE mg/dL
Hgb urine dipstick: NEGATIVE
Ketones, ur: NEGATIVE mg/dL
Nitrite: NEGATIVE
Protein, ur: NEGATIVE mg/dL
SPECIFIC GRAVITY, URINE: 1.012 (ref 1.005–1.030)
Urobilinogen, UA: 1 mg/dL (ref 0.0–1.0)
pH: 6.5 (ref 5.0–8.0)

## 2013-12-20 LAB — FOLATE RBC: RBC FOLATE: 311 ng/mL (ref >280)

## 2013-12-20 LAB — MICROALBUMIN / CREATININE URINE RATIO
Creatinine, Urine: 132.5 mg/dL
MICROALB UR: 2.93 mg/dL — AB (ref 0.00–1.89)
MICROALB/CREAT RATIO: 22.1 mg/g (ref 0.0–30.0)

## 2013-12-20 LAB — HEPATIC FUNCTION PANEL
ALT: 38 U/L — AB (ref 0–35)
AST: 75 U/L — AB (ref 0–37)
Albumin: 3.6 g/dL (ref 3.5–5.2)
Alkaline Phosphatase: 77 U/L (ref 39–117)
BILIRUBIN INDIRECT: 0.8 mg/dL (ref 0.2–1.2)
Bilirubin, Direct: 0.4 mg/dL — ABNORMAL HIGH (ref 0.0–0.3)
TOTAL PROTEIN: 6.6 g/dL (ref 6.0–8.3)
Total Bilirubin: 1.2 mg/dL (ref 0.2–1.2)

## 2013-12-20 LAB — TSH: TSH: 1.613 u[IU]/mL (ref 0.350–4.500)

## 2013-12-20 LAB — VITAMIN D 25 HYDROXY (VIT D DEFICIENCY, FRACTURES): Vit D, 25-Hydroxy: 39 ng/mL (ref 30–89)

## 2013-12-21 ENCOUNTER — Other Ambulatory Visit: Payer: Self-pay | Admitting: Emergency Medicine

## 2013-12-21 DIAGNOSIS — R6889 Other general symptoms and signs: Secondary | ICD-10-CM

## 2013-12-21 MED ORDER — AMOXICILLIN 500 MG PO CAPS: 500.0000 mg | ORAL_CAPSULE | Freq: Three times a day (TID) | ORAL | Status: DC

## 2013-12-26 ENCOUNTER — Encounter: Payer: Self-pay | Admitting: *Deleted

## 2013-12-27 ENCOUNTER — Ambulatory Visit
Admission: RE | Admit: 2013-12-27 | Discharge: 2013-12-27 | Disposition: A | Discharge status: Home / Self Care | Payer: Medicare Other | Source: Ambulatory Visit | Attending: Emergency Medicine | Admitting: Emergency Medicine

## 2013-12-27 DIAGNOSIS — R6889 Other general symptoms and signs: Secondary | ICD-10-CM

## 2014-01-07 LAB — HM DEXA SCAN

## 2014-01-10 ENCOUNTER — Telehealth: Payer: Self-pay | Admitting: *Deleted

## 2014-01-10 NOTE — Telephone Encounter (Signed)
Pt aware of lab results of normal BMD.

## 2014-01-11 ENCOUNTER — Ambulatory Visit (INDEPENDENT_AMBULATORY_CARE_PROVIDER_SITE_OTHER): Payer: Medicare Other | Admitting: Family Medicine

## 2014-01-11 ENCOUNTER — Ambulatory Visit (INDEPENDENT_AMBULATORY_CARE_PROVIDER_SITE_OTHER): Payer: Medicare Other

## 2014-01-11 VITALS — BP 164/78 | HR 65 | Temp 97.8°F | Resp 16 | Ht 65.0 in | Wt 226.6 lb

## 2014-01-11 DIAGNOSIS — M25552 Pain in left hip: Secondary | ICD-10-CM

## 2014-01-11 DIAGNOSIS — M25559 Pain in unspecified hip: Secondary | ICD-10-CM

## 2014-01-11 MED ORDER — METHOCARBAMOL 500 MG PO TABS: 500.0000 mg | ORAL_TABLET | Freq: Four times a day (QID) | ORAL | Status: DC | PRN

## 2014-01-11 MED ORDER — DICLOFENAC SODIUM 75 MG PO TBEC: 75.0000 mg | DELAYED_RELEASE_TABLET | Freq: Two times a day (BID) | ORAL | Status: DC

## 2014-01-11 NOTE — Progress Notes (Signed)
Subjective:    Patient ID: Brittney Valentine, female    DOB: 1940/09/02, 73 y.o.   MRN: [id number] Chief Complaint  Patient presents with  . Hip Pain    x1 week; pt indicates she slept in her recliner and she felt a sharp stabbing pain in her left leg;  hurts more when sitting; pt has been using heating pad with relief. No OTC meds    HPI  After she woke up for an hr napt had a twinge in her lower glut - posterior hip - progressively worsened that day to the point she was in tears.  If she is laying flat she is fine but gets a lot of pain sitting in any chair and after she stands up has severe pain in her posterior hip/glut that radiates down her leg and makes it very difficult to put any weight on her hip.    Past Medical History  Diagnosis Date  . Shingles   . Diverticulosis   . Hemorrhoids   . Portal hypertensive gastropathy   . Varices, esophageal   . Obesity   . Cirrhosis   . Zoster   . Partial small bowel obstruction   . Anemia   . Blood transfusion without reported diagnosis   . Heart murmur   . Thyroid disease   . Personal history of colonic polyps 03/25/2012    8 mm rectal adenoma 03/2012  . Hypertension   . Kidney stones   . Paroxysmal atrial fibrillation   . Degenerative disk disease    Current Outpatient Prescriptions on File Prior to Visit  Medication Sig Dispense Refill  . amoxicillin (AMOXIL) 500 MG capsule Take 1 capsule (500 mg total) by mouth 3 (three) times daily.  21 capsule  0  . Cholecalciferol (VITAMIN D-3) 1000 UNITS CAPS Take by mouth 2 (two) times daily.      . flecainide (TAMBOCOR) 100 MG tablet Take 1 tablet (100 mg total) by mouth 2 (two) times daily.  180 tablet  4  . levothyroxine (SYNTHROID, LEVOTHROID) 112 MCG tablet Take 1 tablet (112 mcg total) by mouth every morning.  90 tablet  4  . Magnesium 250 MG TABS Take by mouth daily.      . vitamin B-12 (CYANOCOBALAMIN) 500 MCG tablet Take 500 mcg by mouth daily.       No current  facility-administered medications on file prior to visit.   Allergies  Allergen Reactions  . Ciprofloxacin     Swelling  . Sulfonamide Derivatives     See little dots, skin feels like its burning     Review of Systems  Constitutional: Negative for fever and chills.  Gastrointestinal: Negative for nausea, vomiting, abdominal pain, diarrhea and constipation.  Genitourinary: Negative for urgency, frequency, decreased urine volume and difficulty urinating.  Musculoskeletal: Positive for arthralgias, back pain, gait problem and myalgias. Negative for joint swelling.  Skin: Negative for rash.  Neurological: Negative for weakness and numbness.  Hematological: Negative for adenopathy. Does not bruise/bleed easily.  Psychiatric/Behavioral: Positive for sleep disturbance.       Objective:  BP 164/78  Pulse 65  Temp(Src) 97.8 F (36.6 C) (Oral)  Resp 16  Ht 5\' 5"  (1.651 m)  Wt 226 lb 9.6 oz (102.785 kg)  BMI 37.71 kg/m2  SpO2 96%  Physical Exam  Constitutional: She is oriented to person, place, and time. She appears well-developed and well-nourished.  HENT:  Head: Normocephalic.  Eyes: Conjunctivae are normal. No scleral icterus.  Neck: Normal  range of motion. Neck supple.  Cardiovascular: Normal rate, regular rhythm and normal heart sounds.   Pulmonary/Chest: Effort normal and breath sounds normal. No respiratory distress.  Musculoskeletal: Normal range of motion. She exhibits no edema.       Right hip: Normal.       Left hip: Normal.       Lumbar back: She exhibits tenderness, pain and spasm. She exhibits normal range of motion, no bony tenderness and no deformity.  Neurological: She is alert and oriented to person, place, and time. She has normal strength and normal reflexes. No sensory deficit. She exhibits normal muscle tone. Coordination and gait normal.  Reflex Scores:      Patellar reflexes are 2+ on the right side and 2+ on the left side.      Achilles reflexes are 2+ on  the right side and 2+ on the left side. Skin: Skin is warm and dry. No erythema.  Psychiatric: She has a normal mood and affect. Her behavior is normal.         [facility] reading (PRIMARY) by  Dr. Brigitte Pulse. Left SI joint: no acute abnormality, no bony pelvis abnormality seen as a source of her pain Left hip: mild arthritis, no acute abnormality Assessment & Plan:   Left hip pain - Plan: DG Hip Complete Left, DG Si Joints, CANCELED: IFOBT POC (occult bld, rslt in office)  Meds ordered this encounter  Medications  . methocarbamol (ROBAXIN) 500 MG tablet    Sig: Take 1-2 tablets (500-1,000 mg total) by mouth every 6 (six) hours as needed for muscle spasms.    Dispense:  40 tablet    Refill:  0  . diclofenac (VOLTAREN) 75 MG EC tablet    Sig: Take 1 tablet (75 mg total) by mouth 2 (two) times daily.    Dispense:  30 tablet    Refill:  1    Delman Cheadle, MD MPH

## 2014-01-26 ENCOUNTER — Telehealth: Payer: Self-pay | Admitting: Internal Medicine

## 2014-01-26 NOTE — Telephone Encounter (Signed)
appt is set.  °

## 2014-01-26 NOTE — Telephone Encounter (Signed)
Ok Thx 

## 2014-01-26 NOTE — Telephone Encounter (Signed)
Pt's sister ,Filomena Jungling, talk to Dr. Camila Li about getting Mrs. Prange to become Dr. Camila Li new pt. please verify this, she said Dr. Camila Li said [place].

## 2014-02-08 ENCOUNTER — Ambulatory Visit: Payer: Self-pay | Admitting: Physician Assistant

## 2014-02-09 ENCOUNTER — Ambulatory Visit (INDEPENDENT_AMBULATORY_CARE_PROVIDER_SITE_OTHER): Payer: Medicare Other | Admitting: Internal Medicine

## 2014-02-09 ENCOUNTER — Encounter: Payer: Self-pay | Admitting: Internal Medicine

## 2014-02-09 VITALS — BP 158/90 | HR 72 | Temp 98.4°F | Resp 16 | Ht 65.0 in | Wt 229.0 lb

## 2014-02-09 DIAGNOSIS — K7460 Unspecified cirrhosis of liver: Secondary | ICD-10-CM

## 2014-02-09 DIAGNOSIS — M5432 Sciatica, left side: Secondary | ICD-10-CM | POA: Insufficient documentation

## 2014-02-09 DIAGNOSIS — M5430 Sciatica, unspecified side: Secondary | ICD-10-CM

## 2014-02-09 DIAGNOSIS — Z23 Encounter for immunization: Secondary | ICD-10-CM

## 2014-02-09 DIAGNOSIS — I8500 Esophageal varices without bleeding: Secondary | ICD-10-CM

## 2014-02-09 NOTE — Progress Notes (Signed)
Pre visit review using our clinic review tool, if applicable. No additional management support is needed unless otherwise documented below in the visit note. 

## 2014-02-09 NOTE — Assessment & Plan Note (Signed)
8/15 Dr Carlean Purl

## 2014-02-09 NOTE — Assessment & Plan Note (Signed)
Better after Diclofenac Rx

## 2014-02-09 NOTE — Progress Notes (Signed)
Subjective:    HPI  New pt  C/o LBP/LLE pain down to the hill x 1 mo. Better w/NSAIDs treatment  The patient presents for a follow-up of  chronic hypertension, palpitations, liver cirrhosis, palpitations since 2000   BP Readings from Last 3 Encounters:  02/09/14 158/90  01/11/14 164/78  12/19/13 158/84   Wt Readings from Last 3 Encounters:  02/09/14 229 lb (103.874 kg)  01/11/14 226 lb 9.6 oz (102.785 kg)  12/19/13 229 lb (103.874 kg)      Review of Systems  Constitutional: Positive for unexpected weight change. Negative for fever, chills, diaphoresis, activity change, appetite change and fatigue.  HENT: Negative for congestion, dental problem, ear pain, hearing loss, mouth sores, postnasal drip, sinus pressure, sneezing, sore throat and voice change.   Eyes: Negative for pain and visual disturbance.  Respiratory: Negative for cough, chest tightness, wheezing and stridor.   Cardiovascular: Negative for chest pain, palpitations and leg swelling.  Gastrointestinal: Negative for nausea, vomiting, abdominal pain, blood in stool, abdominal distention and rectal pain.  Genitourinary: Negative for dysuria, frequency, hematuria, decreased urine volume, vaginal bleeding, vaginal discharge, difficulty urinating, vaginal pain and menstrual problem.  Musculoskeletal: Positive for arthralgias and back pain. Negative for gait problem, joint swelling and neck pain.  Skin: Negative for color change, rash and wound.  Neurological: Negative for dizziness, tremors, syncope, speech difficulty, weakness and light-headedness.  Hematological: Negative for adenopathy.  Psychiatric/Behavioral: Negative for suicidal ideas, hallucinations, behavioral problems, confusion, sleep disturbance, dysphoric mood and decreased concentration. The patient is not nervous/anxious and is not hyperactive.        Objective:   Physical Exam  Constitutional: She appears well-developed. No distress.  Obese     HENT:  Head: Normocephalic.  Right Ear: External ear normal.  Left Ear: External ear normal.  Nose: Nose normal.  Mouth/Throat: Oropharynx is clear and moist.  Eyes: Conjunctivae are normal. Pupils are equal, round, and reactive to light. Right eye exhibits no discharge. Left eye exhibits no discharge.  Neck: Normal range of motion. Neck supple. No JVD present. No tracheal deviation present. No thyromegaly present.  Cardiovascular: Normal rate, regular rhythm and normal heart sounds.   Pulmonary/Chest: No stridor. No respiratory distress. She has no wheezes.  Abdominal: Soft. Bowel sounds are normal. She exhibits no distension and no mass. There is no tenderness. There is no rebound and no guarding.  Musculoskeletal: She exhibits no edema and no tenderness.  Lymphadenopathy:    She has no cervical adenopathy.  Neurological: She displays normal reflexes. No cranial nerve deficit. She exhibits normal muscle tone. Coordination normal.  LS is tender  Skin: No rash noted. No erythema.  Psychiatric: She has a normal mood and affect. Her behavior is normal. Judgment and thought content normal.     Lab Results  Component Value Date   WBC 2.9* 12/19/2013   HGB 14.5 12/19/2013   HCT 41.8 12/19/2013   PLT 117* 12/19/2013   GLUCOSE 98 12/19/2013   CHOL 127 12/19/2013   TRIG 61 12/19/2013   HDL 52 12/19/2013   LDLCALC 63 12/19/2013   ALT 38* 12/19/2013   AST 75* 12/19/2013   NA 141 12/19/2013   K 4.2 12/19/2013   CL 107 12/19/2013   CREATININE 0.75 12/19/2013   BUN 10 12/19/2013   CO2 26 12/19/2013   TSH 1.613 12/19/2013   INR 1.21 04/02/2012   HGBA1C 5.5 12/19/2013   MICROALBUR 2.93* 12/19/2013        Assessment & Plan:

## 2014-02-15 ENCOUNTER — Other Ambulatory Visit: Payer: Self-pay | Admitting: *Deleted

## 2014-02-15 DIAGNOSIS — Z1212 Encounter for screening for malignant neoplasm of rectum: Secondary | ICD-10-CM

## 2014-02-15 DIAGNOSIS — Z0000 Encounter for general adult medical examination without abnormal findings: Secondary | ICD-10-CM

## 2014-02-15 LAB — POC HEMOCCULT BLD/STL (HOME/3-CARD/SCREEN)
Card #2 Fecal Occult Blod, POC: NEGATIVE
FECAL OCCULT BLD: NEGATIVE
Fecal Occult Blood, POC: NEGATIVE

## 2014-02-20 ENCOUNTER — Ambulatory Visit (INDEPENDENT_AMBULATORY_CARE_PROVIDER_SITE_OTHER): Payer: Medicare Other | Admitting: Family Medicine

## 2014-02-20 VITALS — BP 144/76 | HR 60 | Temp 97.7°F | Resp 16 | Ht 64.75 in | Wt 229.2 lb

## 2014-02-20 DIAGNOSIS — E039 Hypothyroidism, unspecified: Secondary | ICD-10-CM

## 2014-02-20 DIAGNOSIS — R002 Palpitations: Secondary | ICD-10-CM

## 2014-02-20 LAB — COMPREHENSIVE METABOLIC PANEL
ALT: 30 U/L (ref 0–35)
AST: 60 U/L — AB (ref 0–37)
Albumin: 3.2 g/dL — ABNORMAL LOW (ref 3.5–5.2)
Alkaline Phosphatase: 81 U/L (ref 39–117)
BUN: 12 mg/dL (ref 6–23)
CALCIUM: 9.2 mg/dL (ref 8.4–10.5)
CO2: 25 mEq/L (ref 19–32)
CREATININE: 0.73 mg/dL (ref 0.50–1.10)
Chloride: 110 mEq/L (ref 96–112)
Glucose, Bld: 116 mg/dL — ABNORMAL HIGH (ref 70–99)
Potassium: 3.8 mEq/L (ref 3.5–5.3)
Sodium: 141 mEq/L (ref 135–145)
Total Bilirubin: 1.3 mg/dL — ABNORMAL HIGH (ref 0.2–1.2)
Total Protein: 6 g/dL (ref 6.0–8.3)

## 2014-02-20 LAB — POCT CBC
Granulocyte percent: 61.8 %G (ref 37–80)
HCT, POC: 41.7 % (ref 37.7–47.9)
Hemoglobin: 13.5 g/dL (ref 12.2–16.2)
LYMPH, POC: 1 (ref 0.6–3.4)
MCH, POC: 30.8 pg (ref 27–31.2)
MCHC: 32.4 g/dL (ref 31.8–35.4)
MCV: 95 fL (ref 80–97)
MID (CBC): 0.3 (ref 0–0.9)
MPV: 7.4 fL (ref 0–99.8)
PLATELET COUNT, POC: 103 10*3/uL — AB (ref 142–424)
POC Granulocyte: 2.2 (ref 2–6.9)
POC LYMPH %: 28.4 % (ref 10–50)
POC MID %: 9.8 %M (ref 0–12)
RBC: 4.39 M/uL (ref 4.04–5.48)
RDW, POC: 16.4 %
WBC: 3.5 10*3/uL — AB (ref 4.6–10.2)

## 2014-02-20 LAB — TSH: TSH: 3.016 u[IU]/mL (ref 0.350–4.500)

## 2014-02-20 NOTE — Progress Notes (Signed)
[facility] [address], [place] [address] [phone]  Date:  02/20/2014   Name:  Brittney Valentine   DOB:  1940/06/11   MRN:  [id number]  PCP:  Walker Kehr, MD    Chief Complaint: Palpitations   History of Present Illness:  Brittney Valentine is a 73 y.o. very pleasant female patient who presents with the following:  She is here today with concern about heart palpitations. She had a flu shot on the 3rd at her PCP office.  A couple of days later she noted onset of recurrent heart palpitations which have been intermittent since.    She has been on flecainide for about 15 years for palpitations.   She saw Dr. Gwenlyn Found in 2001- he started her on the flecainide. Since then she states she had not needed to see cardiology as her sx have been under control She has had a little "breakthrough" palpitations, but has generally been able to control them by taking a flecainade.   She notes the palpiations now.   No SOB, no CP.  She otherwise feels well.    Patient Active Problem List   Diagnosis Date Noted  . 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 Left sided sciatica 02/09/2014  . Personal history of colonic polyps 03/25/2012  . OBESITY 09/27/2009  . SBO 12/31/2007  . CIRRHOSIS 12/31/2007  . PORTAL HYPERTENSION 12/31/2007    Past Medical History  Diagnosis Date  . Shingles   . Diverticulosis   . Hemorrhoids   . Portal hypertensive gastropathy   . Varices, esophageal   . Obesity   . Cirrhosis   . Zoster   . Partial small bowel obstruction   . Anemia   . Blood transfusion without reported diagnosis   . Heart murmur   . Thyroid disease   . Personal history of colonic polyps 03/25/2012    8 mm rectal adenoma 03/2012  . Hypertension   . Kidney stones   . Paroxysmal atrial fibrillation   . Degenerative disk disease     Past Surgical History  Procedure Laterality Date  . Esophagogastroduodenoscopy  multiple  . Colonoscopy    . Abdominal hysterectomy    .  Cholecystectomy  2000  . Appendectomy  1991  . Colon surgery    . Colon surgery      History  Substance Use Topics  . Smoking status: Never Smoker   . Smokeless tobacco: Never Used  . Alcohol Use: No    Family History  Problem Relation Age of Onset  . Heart failure Mother     died from  . Hypertension Mother   . Heart attack Father     died from  . Hypertension Sister     Allergies  Allergen Reactions  . Ciprofloxacin     Swelling  . Robaxin [Methocarbamol]     palpiations  . Sulfonamide Derivatives     See little dots, skin feels like its burning  . Verapamil     palpitations    Medication list has been reviewed and updated.  Current Outpatient Prescriptions on File Prior to Visit  Medication Sig Dispense Refill  . Cholecalciferol (VITAMIN D-3) 1000 UNITS CAPS Take by mouth 2 (two) times daily.      . flecainide (TAMBOCOR) 100 MG tablet Take 1 tablet (100 mg total) by mouth 2 (two) times daily.  180 tablet  4  . levothyroxine (SYNTHROID, LEVOTHROID) 112 MCG tablet Take 1 tablet (112 mcg total) by mouth  every morning.  90 tablet  4  . Magnesium 250 MG TABS Take by mouth daily.      . vitamin B-12 (CYANOCOBALAMIN) 500 MCG tablet Take 500 mcg by mouth daily.       No current facility-administered medications on file prior to visit.    Review of Systems:  As per HPI- otherwise negative. She adamantly denies any chest pain  Physical Examination: Filed Vitals:   02/20/14 1238  BP: 144/76  Pulse: 60  Temp: 97.7 F (36.5 C)  Resp: 16   Filed Vitals:   02/20/14 1238  Height: 5' 4.75" (1.645 m)  Weight: 229 lb 3.2 oz (103.964 kg)   Body mass index is 38.42 kg/(m^2). Ideal Body Weight: Weight in (lb) to have BMI = 25: 148.8  GEN: WDWN, NAD, Non-toxic, A & O x 3, obese, looks well HEENT: Atraumatic, Normocephalic. Neck supple. No masses, No LAD. Ears and Nose: No external deformity. CV: RRR with occasional likely PVCs, No M/G/R. No JVD. No thrill. No  extra heart sounds. PULM: CTA B, no wheezes, crackles, rhonchi. No retractions. No resp. distress. No accessory muscle use. ABD: S, NT, ND EXTR: No c/c/e NEURO Normal gait.  PSYCH: Normally interactive. Conversant. Not depressed or anxious appearing.  Calm demeanor.   EKG:  Mild bradycardia, OW wnl.  No irregular beats are captured on rhythm strip, no ST elevation or depression, no significant change from last EKG She stated palpitations were resolved at the time EKG performed   Assessment and Plan: Palpitations - Plan: POCT CBC, Comprehensive metabolic panel, TSH, EKG 41-YSAY, Ambulatory referral to Cardiology, CANCELED: Flecainide level  Unspecified hypothyroidism - Plan: TSH  Brittney Valentine is here today with palpitations- suspect PVCs but she has no sx/ palpitations at this time.  Decided not to do a flecainide level as she is off of her usual schedule so level not likely to be informative . She will go back on her normal flecainide regimen and I will refer her to cardiology asap  If any problems in the meantime she will seek care.    Signed Lamar Blinks, MD

## 2014-02-20 NOTE — Patient Instructions (Signed)
We will get you set up to see cardiology soon- if you have any chest pains or other problems in the meantime please seek care right away!  I will be in touch with the rest of your labs asap

## 2014-02-23 ENCOUNTER — Telehealth: Payer: Self-pay

## 2014-02-23 ENCOUNTER — Encounter: Payer: Self-pay | Admitting: Family Medicine

## 2014-02-23 NOTE — Telephone Encounter (Signed)
Pt wants to discuss the appt she has scheduled with the heart doctor with Dr. Lorelei Pont.

## 2014-02-25 NOTE — Telephone Encounter (Signed)
Left message on machine to call back  

## 2014-02-26 NOTE — Telephone Encounter (Signed)
Phone is not accepting calls at this time.

## 2014-02-27 ENCOUNTER — Telehealth: Payer: Self-pay | Admitting: Internal Medicine

## 2014-02-27 NOTE — Telephone Encounter (Signed)
Patient called to get a suggestion for referral to cardiologist.  Already scheduled with cardiologist through [facility], at her most recent visit.  Brittney Valentine reviewed the notes and EKG, and agreed. Advised patient.  Thank you, Katrina Judeth Horn [facility] [phone] Fax [phone]

## 2014-03-03 NOTE — Telephone Encounter (Signed)
Spoke to pt- She has had all of her answers to her questions when she called her PCP.   Please send lab results done here to Dr. Idell Pickles attention.

## 2014-03-28 ENCOUNTER — Institutional Professional Consult (permissible substitution): Payer: Medicare Other | Admitting: Interventional Cardiology

## 2014-05-06 ENCOUNTER — Encounter: Payer: Self-pay | Admitting: *Deleted

## 2014-05-06 DIAGNOSIS — Z780 Asymptomatic menopausal state: Secondary | ICD-10-CM

## 2014-05-10 ENCOUNTER — Ambulatory Visit (INDEPENDENT_AMBULATORY_CARE_PROVIDER_SITE_OTHER): Payer: Medicare Other | Admitting: Interventional Cardiology

## 2014-05-10 ENCOUNTER — Telehealth: Payer: Self-pay

## 2014-05-10 ENCOUNTER — Encounter: Payer: Self-pay | Admitting: Interventional Cardiology

## 2014-05-10 ENCOUNTER — Telehealth: Payer: Self-pay | Admitting: Cardiology

## 2014-05-10 VITALS — BP 138/68 | HR 67 | Ht 64.75 in | Wt 234.4 lb

## 2014-05-10 DIAGNOSIS — I8500 Esophageal varices without bleeding: Secondary | ICD-10-CM

## 2014-05-10 DIAGNOSIS — I480 Paroxysmal atrial fibrillation: Secondary | ICD-10-CM

## 2014-05-10 DIAGNOSIS — R002 Palpitations: Secondary | ICD-10-CM

## 2014-05-10 MED ORDER — METOPROLOL TARTRATE 25 MG PO TABS: 25.0000 mg | ORAL_TABLET | Freq: Two times a day (BID) | ORAL | Status: DC

## 2014-05-10 MED ORDER — ASPIRIN EC 325 MG PO TBEC: 325.0000 mg | DELAYED_RELEASE_TABLET | Freq: Every day | ORAL | Status: DC

## 2014-05-10 NOTE — Telephone Encounter (Signed)
Called patient, left message to call back. Need to let her know, per Dr. Irish Lack does not want her to take Aspirin 325 mg due to history of esophageal varices. Dr. Irish Lack will consult her GI doctor for further update and recommendations.

## 2014-05-10 NOTE — Patient Instructions (Signed)
Your physician has recommended you make the following change in your medication:   START ASPIRIN 325MG  ONCE DAILY BY MOUTH  START METOPROLOL TARTRATE 25 MG TWICE DAILY BY MOUTH  Your physician recommends that you schedule a follow-up appointment in: [place] DR. VARANASI.

## 2014-05-10 NOTE — Progress Notes (Signed)
Patient ID: Brittney Valentine, female   DOB: 08-06-1940, 73 y.o.   MRN: [id number]     Patient ID: Brittney Valentine MRN: [id number] DOB/AGE: March 21, 1941 73 y.o.   Referring Physician Dr. Ernestine Conrad   Reason for Consultation palpitations  HPI: 63 y/o who has had a history of paroxysmal atrial fibrillation. He was diagnosed in 2000 at the time of surgery. She saw Dr. Gwenlyn Found at that time in the hospital. She was started on flecainide at that time. She has been on flecainide for nearly 15 years. More recently, she has had some palpitations. It feels like a skipped heartbeat that last 1-2 seconds. He can occur quite frequently. She describes feeling of feeling her pulse in her eyes. She denies any lightheadedness or syncope. There've been no prolonged episodes of irregular palpitations. She has not followed with a cardiologist for many years. She has just been taking the flecainide. She has not had a chest discomfort or shortness of breath.  No bleeding problems.   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   Current Outpatient Prescriptions  Medication Sig Dispense Refill  . Cholecalciferol (VITAMIN D-3) 1000 UNITS CAPS Take by mouth 2 (two) times daily.    . flecainide (TAMBOCOR) 100 MG tablet Take 1 tablet (100 mg total) by mouth 2 (two) times daily. 180 tablet 4  . levothyroxine (SYNTHROID, LEVOTHROID) 112 MCG tablet Take 1 tablet (112 mcg total) by mouth every morning. 90 tablet 4  . Magnesium 250 MG TABS Take by mouth daily.     No current facility-administered medications for this visit.   Past Medical History  Diagnosis Date  . Shingles   . Diverticulosis   . Hemorrhoids   . Portal hypertensive gastropathy   . Varices, esophageal   . Obesity   . Cirrhosis   . Zoster   . Partial small bowel obstruction   . Anemia   . Blood transfusion without reported diagnosis   . Heart murmur   . Thyroid disease   . Personal history of colonic polyps 03/25/2012    8  mm rectal adenoma 03/2012  . Hypertension   . Kidney stones   . Paroxysmal atrial fibrillation   . Degenerative disk disease     Family History  Problem Relation Age of Onset  . Heart failure Mother     died from  . Hypertension Mother   . Heart attack Father     died from  . Hypertension Sister     History   Social History  . Marital Status: Single    Spouse Name: N/A    Number of Children: N/A  . Years of Education: N/A   Occupational History  . Not on file.   Social History Main Topics  . Smoking status: Never Smoker   . Smokeless tobacco: Never Used  . Alcohol Use: No  . Drug Use: No  . Sexual Activity: Not on file   Other Topics Concern  . Not on file   Social History Narrative    Past Surgical History  Procedure Laterality Date  . Esophagogastroduodenoscopy  multiple  . Colonoscopy    . Abdominal hysterectomy    . Cholecystectomy  2000  . Appendectomy  1991  . Colon surgery    . Colon surgery        (Not in a hospital admission)  Review of systems complete and found to be negative unless listed above .  No nausea, vomiting.  No fever chills,  No focal weakness,  No palpitations.  Physical Exam: Filed Vitals:   05/10/14 1235  BP: 138/68  Pulse: 67    Weight: 234 lb 6.4 oz (106.323 kg)  Physical exam:  [place]/AT EOMI No JVD, No carotid bruit RRR S1S2  No wheezing Soft. NT, nondistended No edema. No focal motor or sensory deficits Normal affect No rash  Labs:   Lab Results  Component Value Date   WBC 3.5* 02/20/2014   HGB 13.5 02/20/2014   HCT 41.7 02/20/2014   MCV 95.0 02/20/2014   PLT 117* 12/19/2013   No results for input(s): NA, K, CL, CO2, BUN, CREATININE, CALCIUM, PROT, BILITOT, ALKPHOS, ALT, AST, GLUCOSE in the last 168 hours.  Invalid input(s): LABALBU No results found for: CKTOTAL, CKMB, CKMBINDEX, TROPONINI Lab Results  Component Value Date   CHOL 127 12/19/2013   Lab Results  Component Value Date   HDL 52 12/19/2013    Lab Results  Component Value Date   LDLCALC 63 12/19/2013   Lab Results  Component Value Date   TRIG 61 12/19/2013   Lab Results  Component Value Date   CHOLHDL 2.4 12/19/2013   No results found for: LDLDIRECT    Radiology: EKG: Normal sinus rhythm with PACs  ASSESSMENT AND PLAN:  1) Palpitations: Symptoms sound like PACs. These of been documented on her ECG. Given that she is on flecainide, will add low-dose metoprolol. Flecainide alone without any AV nodal blocking agent can lead to symptomatic atrial flutter.  We'll consider  starting aspirin 325 mg daily. We did discuss anticoagulation. Given that she is over 73 and female, her chads vasc score is 2, however she is hesitant to start any anticoagulation. She has been feeling well on flecainide alone for many years. She is willing to start aspirin. If symptoms persist, consider event monitor. Will hold off at this time and see if the metoprolol suppresses her palpitations.  Given the liver disease, would like to check with her GI doctor whether aspirin would be acceptable. It's unclear whether the risks may be too high given his history of esophageal varices, due to cryptogenic cirrhosis.  Continue thyroid supplementation.   Lipids well controlled.   Signed:   Mina Marble, MD, [facility] 05/10/2014, 1:01 PM

## 2014-05-10 NOTE — Telephone Encounter (Signed)
Pt called after hours. She was contacted by the office not to take one of her medications but was not told which one. Review of office notes show she was to stop ASA. This was relayed to pt.  Kerin Ransom PA-C 05/10/2014 6:06 PM

## 2014-05-11 DIAGNOSIS — I4891 Unspecified atrial fibrillation: Secondary | ICD-10-CM | POA: Insufficient documentation

## 2014-05-11 NOTE — Telephone Encounter (Signed)
Talked to patient about not taking the Aspirin. Informed patient that Dr. Irish Lack will consult her GI doctor and will call her with any other recommendations. Patient verbalized understanding and had not further questions.

## 2014-05-16 ENCOUNTER — Telehealth: Payer: Self-pay | Admitting: Interventional Cardiology

## 2014-05-16 DIAGNOSIS — R002 Palpitations: Secondary | ICD-10-CM

## 2014-05-16 NOTE — Telephone Encounter (Signed)
New message      Pt is on metoprolol-----she is now depressed since on this medication--wanting to sleep all of the time.  It also makes her nauseated and dizzy.  She takes zantac to keep her food down.  She says the skin on her legs burn at night and she feels hot all of the time.   She feels itchy and her legs swell.  Could this be the medicaton?

## 2014-05-16 NOTE — Telephone Encounter (Signed)
I spoke with the patient. Dr. Irish Lack saw her on 05/10/14 and started her on metoprolol in addition to her flecainide. She states that she is extremely tired and nauseated, as well as having spells where she feels hot. She reports her BP today was 171/72 HR- 50 at 1213. She feels like her palpitations have been controlled a little better. She did not take her metoprolol this morning. Last dose was yesterday afternoon. I have advised the patient not to take any more of her metoprolol today. I will forward to Dr. Irish Lack to review. I advised the patient that I am unsure if he will replace the metoprolol with another beta blocker due to the side effects she is having at this point, or if she will need to have her flecainide replaced in order to avoid the beta blocker. I will call her back after Dr. Irish Lack has reviewed.

## 2014-05-16 NOTE — Telephone Encounter (Signed)
I reviewed the patient's complaints with Dr. Caryl Comes to see what should be done in the short term with the patient's metoprolol. He reviewed her chart and advised that she can stay off metoprolol to let this wash out of her system. He also advised that she stop ASA since this was started at the same time. I relayed this to the patient and she voices understanding. She states she had already stopped ASA. She will continue to hold metoprolol. I advised I would forward to Dr. Irish Lack for review and back in touch with her on Thursday with further recommendations and follow up of her symptoms.

## 2014-05-18 NOTE — Telephone Encounter (Signed)
I called and spoke with the patient. She reports that her overall symptoms have improved- she does not feel nauseated or like she is hot. She does feel her heart pounding. She took her BP while on the phone with me and it was 182/80. The patient states she is unsure of how accurate her cuff is. I advised we could have her come in for a BP check tomorrow and she could bring her cuff for correlation or she could go to the pharmacy and check her pressure (and take her cuff with her to compare). Due to her heart pounding, she is wanting to try the metoprolol back at a 1/2 pill BID. She will try this over the weekend and check her BP at the pharmacy. She will stop the metoprolol should she re-develop symptoms. She will call back Monday with how she is doing and her BP readings. Will forward to Dr. Irish Lack as an Juluis Rainier.

## 2014-05-18 NOTE — Telephone Encounter (Signed)
OK to stay off of both aspirin and metoprolol.  How has BP been?  If still high, may need low dose of amlodipine.

## 2014-05-24 NOTE — Telephone Encounter (Signed)
I left a message for the patient to call. 

## 2014-05-24 NOTE — Telephone Encounter (Signed)
Follow up      Calling to give Brittney Valentine an update

## 2014-05-25 MED ORDER — DILTIAZEM HCL ER COATED BEADS 120 MG PO CP24: 120.0000 mg | ORAL_CAPSULE | Freq: Every day | ORAL | Status: DC

## 2014-05-25 NOTE — Telephone Encounter (Signed)
I spoke with the patient. She reports that she retried metoprolol at 1/2 tablet. She took one dose of this and re-developed symptoms of feeling hot/ nauseated. She did take her BP on Saturday at [facility]- per the [facility] cuff she was 159/68 (62) & per her cuff she was 152/75 (55). She went back to the same [facility] on 12/14 and her bp was 161/64 (67) & per her cuff she was 147/70 (59). She has noticed that her heart has been pounding some. This does not occur constantly, but when this occurs it is very hard and can last all day. She states she is not using any caffeinated products such as coffee/ tea/ sodas. She is trying not to eat chocolate either. I advised I will send to Dr. Irish Lack to review and call her back with recommendations.

## 2014-05-25 NOTE — Telephone Encounter (Signed)
Would add diltiazem CD 120 mg daily.

## 2014-05-25 NOTE — Telephone Encounter (Signed)
The patient is aware of Dr. Irish Lack recommendations. She is willing to try diltiazem. I will send this in to [facility] on [address]. She will let us know how she does with this.

## 2014-06-22 ENCOUNTER — Ambulatory Visit (INDEPENDENT_AMBULATORY_CARE_PROVIDER_SITE_OTHER): Payer: PPO | Admitting: Interventional Cardiology

## 2014-06-22 ENCOUNTER — Encounter: Payer: Self-pay | Admitting: Interventional Cardiology

## 2014-06-22 VITALS — BP 160/90 | HR 60 | Ht 65.0 in | Wt 233.0 lb

## 2014-06-22 DIAGNOSIS — I10 Essential (primary) hypertension: Secondary | ICD-10-CM

## 2014-06-22 DIAGNOSIS — I480 Paroxysmal atrial fibrillation: Secondary | ICD-10-CM

## 2014-06-22 DIAGNOSIS — K766 Portal hypertension: Secondary | ICD-10-CM

## 2014-06-22 DIAGNOSIS — I8500 Esophageal varices without bleeding: Secondary | ICD-10-CM

## 2014-06-22 MED ORDER — LOSARTAN POTASSIUM 25 MG PO TABS: 25.0000 mg | ORAL_TABLET | Freq: Every day | ORAL | Status: DC

## 2014-06-22 NOTE — Progress Notes (Signed)
Patient ID: Brittney Valentine, female   DOB: 11-03-1940, 74 y.o.   MRN: [id number]     Patient ID: Brittney Valentine MRN: [id number] DOB/AGE: 19-Mar-1941 75 y.o.   Referring Physician Dr. Ernestine Conrad   Reason for Consultation palpitations  HPI: 74 y/o who has had a history of paroxysmal atrial fibrillation. He was diagnosed in 2000 at the time of surgery. She saw Dr. Gwenlyn Found at that time in the hospital. She was started on flecainide at that time. She has been on flecainide for nearly 15 years. More recently, she has had some palpitations. It feels like a skipped heartbeat that last 1-2 seconds. He can occur quite frequently. She describes feeling of feeling her pulse in her eyes. She denies any lightheadedness or syncope. There've been no prolonged episodes of irregular palpitations.  We tried metoprolol and diltiazem and she did not tolerate either.  Her BP at home is checked and is typically around 683 systolic.  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  Because of this, we decided to not use aspirin.     Current Outpatient Prescriptions  Medication Sig Dispense Refill  . Cholecalciferol (VITAMIN D-3) 1000 UNITS CAPS Take by mouth 2 (two) times daily.    Marland Kitchen diltiazem (CARDIZEM CD) 120 MG 24 hr capsule Take 1 capsule (120 mg total) by mouth daily. 30 capsule 6  . flecainide (TAMBOCOR) 100 MG tablet Take 1 tablet (100 mg total) by mouth 2 (two) times daily. 180 tablet 4  . levothyroxine (SYNTHROID, LEVOTHROID) 112 MCG tablet Take 1 tablet (112 mcg total) by mouth every morning. 90 tablet 4  . Magnesium 250 MG TABS Take by mouth daily.     No current facility-administered medications for this visit.   Past Medical History  Diagnosis Date  . Shingles   . Diverticulosis   . Hemorrhoids   . Portal hypertensive gastropathy   . Varices, esophageal   . Obesity   . Cirrhosis   . Zoster   . Partial small bowel obstruction   . Anemia   . Blood transfusion without  reported diagnosis   . Heart murmur   . Thyroid disease   . Personal history of colonic polyps 03/25/2012    8 mm rectal adenoma 03/2012  . Hypertension   . Kidney stones   . Paroxysmal atrial fibrillation   . Degenerative disk disease     Family History  Problem Relation Age of Onset  . Heart failure Mother     died from  . Hypertension Mother   . Heart attack Father     died from  . Hypertension Sister     History   Social History  . Marital Status: Single    Spouse Name: N/A    Number of Children: N/A  . Years of Education: N/A   Occupational History  . Not on file.   Social History Main Topics  . Smoking status: Never Smoker   . Smokeless tobacco: Never Used  . Alcohol Use: No  . Drug Use: No  . Sexual Activity: Not on file   Other Topics Concern  . Not on file   Social History Narrative    Past Surgical History  Procedure Laterality Date  . Esophagogastroduodenoscopy  multiple  . Colonoscopy    . Abdominal hysterectomy    . Cholecystectomy  2000  . Appendectomy  1991  . Colon surgery    . Colon surgery        (Not  in a hospital admission)  Review of systems complete and found to be negative unless listed above .  No nausea, vomiting.  No fever chills, No focal weakness,  No palpitations.  Physical Exam: Filed Vitals:   06/22/14 1334  BP: 160/90  Pulse: 60    Weight: 233 lb (105.688 kg)  Physical exam: No apparent distress [place]/AT EOMI No JVD, No carotid bruit RRR S1S2  No wheezing Soft. NT, nondistended No edema. No focal motor or sensory deficits Normal affect No rash  Labs:   Lab Results  Component Value Date   WBC 3.5* 02/20/2014   HGB 13.5 02/20/2014   HCT 41.7 02/20/2014   MCV 95.0 02/20/2014   PLT 117* 12/19/2013   No results for input(s): NA, K, CL, CO2, BUN, CREATININE, CALCIUM, PROT, BILITOT, ALKPHOS, ALT, AST, GLUCOSE in the last 168 hours.  Invalid input(s): LABALBU No results found for: CKTOTAL, CKMB, CKMBINDEX,  TROPONINI  Lab Results  Component Value Date   CHOL 127 12/19/2013   Lab Results  Component Value Date   HDL 52 12/19/2013   Lab Results  Component Value Date   LDLCALC 63 12/19/2013   Lab Results  Component Value Date   TRIG 61 12/19/2013   Lab Results  Component Value Date   CHOLHDL 2.4 12/19/2013   No results found for: LDLDIRECT    Radiology: EKG: Normal sinus rhythm with PACs  ASSESSMENT AND PLAN:  1) Palpitations: Symptoms sound like PACs. These of been documented on her ECG, again today.   Known PAF.  No anticoagulation due to liver disease. Given that she is over 36 and female, her chads vasc score is 2. She has been feeling well on flecainide alone for many years.  If symptoms persist, consider event monitor.    Higher risk of bleeding with history of esophageal varices, due to cryptogenic cirrhosis.  Continue thyroid supplementation.  TSH has been controlled per her report.  Lipids well controlled.   HTN: BP still elevated.  She had a cough with a medicine in the past.  I think this was an ACE-I .  Will start an ARB at low dose, Losartan 25 mg daily.  Target BP < 150/90.  If she is intolerant of this, would consider referring her to the pharmacy HTN program.   Signed:   Mina Marble, MD, [facility] 06/22/2014, 1:59 PM

## 2014-06-22 NOTE — Patient Instructions (Signed)
Your physician has recommended you make the following change in your medication: 1) START TAKING LOSARTAN 25MG  DAILY  PLEASE CALL OUR OFFICE WITH BLOOD PRESSURE READINGS IN 1-2 WEEKS AND ASK FOR A TRIAGE NURSE.  Your physician recommends that you return for lab work in: 1 week (BMET)  Your physician wants you to follow-up in: [place] DR. VARANASSI. You will receive a reminder letter in the mail two months in advance. If you don't receive a letter, please call our office to schedule the follow-up appointment.

## 2014-06-29 ENCOUNTER — Other Ambulatory Visit (INDEPENDENT_AMBULATORY_CARE_PROVIDER_SITE_OTHER): Payer: PPO | Admitting: *Deleted

## 2014-06-29 ENCOUNTER — Telehealth: Payer: Self-pay

## 2014-06-29 DIAGNOSIS — I159 Secondary hypertension, unspecified: Secondary | ICD-10-CM

## 2014-06-29 DIAGNOSIS — K766 Portal hypertension: Secondary | ICD-10-CM

## 2014-06-29 LAB — BASIC METABOLIC PANEL
BUN: 15 mg/dL (ref 6–23)
CO2: 27 mEq/L (ref 19–32)
CREATININE: 0.79 mg/dL (ref 0.40–1.20)
Calcium: 9.4 mg/dL (ref 8.4–10.5)
Chloride: 111 mEq/L (ref 96–112)
GFR: 76.22 mL/min (ref >60.00)
Glucose, Bld: 118 mg/dL — ABNORMAL HIGH (ref 70–99)
Potassium: 4.2 mEq/L (ref 3.5–5.1)
SODIUM: 142 meq/L (ref 135–145)

## 2014-06-29 NOTE — Telephone Encounter (Signed)
Pt left a Copy off home BP Readings:     1/15: 186/91  82 1/16: 163/74  63 1/17: 160/74  62 1/18: 170/74  62 1/19: 172/71  61 1/20: 167/71  62 1/21: 148/65  72

## 2014-06-29 NOTE — Telephone Encounter (Signed)
Increase losartan to 50 mg daily.  BMet in one week.

## 2014-06-29 NOTE — Addendum Note (Signed)
Addended by: Eulis Foster on: 06/29/2014 01:00 PM   Modules accepted: Orders

## 2014-07-03 MED ORDER — LOSARTAN POTASSIUM 50 MG PO TABS: 50.0000 mg | ORAL_TABLET | Freq: Every day | ORAL | Status: DC

## 2014-07-03 NOTE — Telephone Encounter (Signed)
Pt made aware of medication change and agree to come in on 2/2 for repeat blood work.  Pt stated that after starting Losartan 25mg  PO a few weeks ago she experienced constipation and has started taking a OTC stool softer. Pt is feeling relief so far and going to call or office if it becomes a problem after starting increased dose.

## 2014-07-03 NOTE — Addendum Note (Signed)
Addended by: Newt Minion on: 07/03/2014 03:37 PM   Modules accepted: Orders

## 2014-07-03 NOTE — Telephone Encounter (Signed)
Pt called lmtcb 1/25;

## 2014-07-03 NOTE — Addendum Note (Signed)
Addended by: Newt Minion on: 07/03/2014 12:12 PM   Modules accepted: Orders, Medications

## 2014-07-04 NOTE — Telephone Encounter (Signed)
OTC stool softener is ok.

## 2014-07-11 ENCOUNTER — Other Ambulatory Visit (INDEPENDENT_AMBULATORY_CARE_PROVIDER_SITE_OTHER): Payer: PPO | Admitting: *Deleted

## 2014-07-11 ENCOUNTER — Telehealth: Payer: Self-pay | Admitting: *Deleted

## 2014-07-11 DIAGNOSIS — I159 Secondary hypertension, unspecified: Secondary | ICD-10-CM

## 2014-07-11 DIAGNOSIS — K766 Portal hypertension: Secondary | ICD-10-CM

## 2014-07-11 LAB — BASIC METABOLIC PANEL
BUN: 17 mg/dL (ref 6–23)
CALCIUM: 9.8 mg/dL (ref 8.4–10.5)
CO2: 24 mEq/L (ref 19–32)
Chloride: 109 mEq/L (ref 96–112)
Creatinine, Ser: 0.71 mg/dL (ref 0.40–1.20)
GFR: 86.21 mL/min (ref >60.00)
Glucose, Bld: 91 mg/dL (ref 70–99)
Potassium: 4 mEq/L (ref 3.5–5.1)
SODIUM: 139 meq/L (ref 135–145)

## 2014-07-11 NOTE — Telephone Encounter (Signed)
Pt came by office today and dropped off blood pressure readings.  07/01/14- 170/66, p- 62 07/02/14- 168/80, p- 68 07/03/14- 188/79, p- 60 07/04/14- 158/73, p- 61 07/05/14- 174/77, p- 60 07/06/14- 145/64, p- 71 07/07/14- Pt out of town 07/08/14- 159/76, p- 82 07/09/14- 158/74, p- 71 07/10/14- 143/72, p-62 07/11/14- 169/82, p- 65  Will forward these readings to Dr. Irish Lack for review

## 2014-07-12 NOTE — Telephone Encounter (Signed)
Incrase losartan to 100 mg daily.  BMet in one week

## 2014-07-12 NOTE — Telephone Encounter (Signed)
LMTCB

## 2014-07-13 MED ORDER — LOSARTAN POTASSIUM 100 MG PO TABS: 100.0000 mg | ORAL_TABLET | Freq: Every day | ORAL | Status: DC

## 2014-07-13 NOTE — Addendum Note (Signed)
Addended by: Loren Racer on: 07/13/2014 08:50 AM   Modules accepted: Orders

## 2014-07-13 NOTE — Telephone Encounter (Signed)
Informed pt of medication change. Informed pt that Dr. Irish Lack wanted her to start taking Losartan 100mg  daily. Informed pt that we would need her to come in for blood work in a week. Made appt for 07/20/14 for BMET. PT verbalized understanding and was in agreement with this plan.

## 2014-07-20 ENCOUNTER — Other Ambulatory Visit (INDEPENDENT_AMBULATORY_CARE_PROVIDER_SITE_OTHER): Payer: PPO | Admitting: *Deleted

## 2014-07-20 ENCOUNTER — Telehealth: Payer: Self-pay | Admitting: *Deleted

## 2014-07-20 DIAGNOSIS — K766 Portal hypertension: Secondary | ICD-10-CM

## 2014-07-20 LAB — BASIC METABOLIC PANEL
BUN: 18 mg/dL (ref 6–23)
CALCIUM: 9.5 mg/dL (ref 8.4–10.5)
CO2: 25 mEq/L (ref 19–32)
CREATININE: 0.76 mg/dL (ref 0.40–1.20)
Chloride: 111 mEq/L (ref 96–112)
GFR: 79.69 mL/min (ref >60.00)
Glucose, Bld: 99 mg/dL (ref 70–99)
Potassium: 3.6 mEq/L (ref 3.5–5.1)
Sodium: 141 mEq/L (ref 135–145)

## 2014-07-20 MED ORDER — LOSARTAN POTASSIUM 100 MG PO TABS: 100.0000 mg | ORAL_TABLET | Freq: Every day | ORAL | Status: DC

## 2014-07-20 NOTE — Telephone Encounter (Signed)
Pt in office today for lab appt and dropped off BP readings.  Will forward readings to Dr. Irish Lack for review.  07/12/14- BP 149/67, P 70 07/13/14- BP 145/77, P 71 07/14/14- BP 150/69, P 71 07/15/14- BP 163/72, P 68 07/16/14- BP 164/75, P 70 07/17/14- BP 174/77, P 69 07/18/14- BP 152/75, P 71 07/19/14- BP 154/72, P 72 07/20/14- BP 156/74, P- 64

## 2014-07-21 NOTE — Telephone Encounter (Signed)
BP better.  COntinue to monitor.

## 2014-07-24 NOTE — Telephone Encounter (Signed)
Spoke with pt and informed her that Dr. Irish Lack said that her BP readings were better and to continue to monitor her BP. Informed pt to give [place] a call if she began to see elevated readings.  Pt verbalized understanding and was in agreement with this plan.

## 2014-07-24 NOTE — Telephone Encounter (Signed)
F/U       Pt returning call from today.    Please call back.

## 2014-09-12 ENCOUNTER — Ambulatory Visit (INDEPENDENT_AMBULATORY_CARE_PROVIDER_SITE_OTHER): Payer: PPO | Admitting: Internal Medicine

## 2014-09-12 ENCOUNTER — Encounter: Payer: Self-pay | Admitting: Internal Medicine

## 2014-09-12 VITALS — BP 142/86 | HR 62 | Temp 98.2°F | Resp 18 | Ht 65.75 in | Wt 246.0 lb

## 2014-09-12 DIAGNOSIS — K766 Portal hypertension: Secondary | ICD-10-CM

## 2014-09-12 DIAGNOSIS — E559 Vitamin D deficiency, unspecified: Secondary | ICD-10-CM | POA: Insufficient documentation

## 2014-09-12 DIAGNOSIS — R7309 Other abnormal glucose: Secondary | ICD-10-CM

## 2014-09-12 DIAGNOSIS — E669 Obesity, unspecified: Secondary | ICD-10-CM

## 2014-09-12 DIAGNOSIS — I480 Paroxysmal atrial fibrillation: Secondary | ICD-10-CM

## 2014-09-12 DIAGNOSIS — Z79899 Other long term (current) drug therapy: Secondary | ICD-10-CM | POA: Insufficient documentation

## 2014-09-12 LAB — BASIC METABOLIC PANEL WITH GFR
BUN: 16 mg/dL (ref 6–23)
CALCIUM: 9.3 mg/dL (ref 8.4–10.5)
CO2: 23 mEq/L (ref 19–32)
CREATININE: 0.74 mg/dL (ref 0.50–1.10)
Chloride: 109 mEq/L (ref 96–112)
GFR, EST NON AFRICAN AMERICAN: 82 mL/min
Glucose, Bld: 90 mg/dL (ref 70–99)
Potassium: 4.4 mEq/L (ref 3.5–5.3)
Sodium: 140 mEq/L (ref 135–145)

## 2014-09-12 LAB — CBC WITH DIFFERENTIAL/PLATELET
BASOS ABS: 0 10*3/uL (ref 0.0–0.1)
Basophils Relative: 1 % (ref 0–1)
Eosinophils Absolute: 0.1 10*3/uL (ref 0.0–0.7)
Eosinophils Relative: 2 % (ref 0–5)
HEMATOCRIT: 40.1 % (ref 36.0–46.0)
HEMOGLOBIN: 13.4 g/dL (ref 12.0–15.0)
Lymphocytes Relative: 29 % (ref 12–46)
Lymphs Abs: 0.9 10*3/uL (ref 0.7–4.0)
MCH: 31 pg (ref 26.0–34.0)
MCHC: 33.4 g/dL (ref 30.0–36.0)
MCV: 92.8 fL (ref 78.0–100.0)
MONOS PCT: 14 % — AB (ref 3–12)
MPV: 10.5 fL (ref 8.6–12.4)
Monocytes Absolute: 0.4 10*3/uL (ref 0.1–1.0)
NEUTROS ABS: 1.7 10*3/uL (ref 1.7–7.7)
NEUTROS PCT: 54 % (ref 43–77)
Platelets: 118 10*3/uL — ABNORMAL LOW (ref 150–400)
RBC: 4.32 MIL/uL (ref 3.87–5.11)
RDW: 15.5 % (ref 11.5–15.5)
WBC: 3.1 10*3/uL — ABNORMAL LOW (ref 4.0–10.5)

## 2014-09-12 LAB — HEMOGLOBIN A1C
Hgb A1c MFr Bld: 5.8 % — ABNORMAL HIGH (ref <5.7)
Mean Plasma Glucose: 120 mg/dL — ABNORMAL HIGH (ref <117)

## 2014-09-12 LAB — TSH: TSH: 2.455 u[IU]/mL (ref 0.350–4.500)

## 2014-09-12 LAB — HEPATIC FUNCTION PANEL
ALT: 51 U/L — AB (ref 0–35)
AST: 90 U/L — AB (ref 0–37)
Albumin: 3.1 g/dL — ABNORMAL LOW (ref 3.5–5.2)
Alkaline Phosphatase: 110 U/L (ref 39–117)
Bilirubin, Direct: 0.3 mg/dL (ref 0.0–0.3)
Indirect Bilirubin: 1 mg/dL (ref 0.2–1.2)
TOTAL PROTEIN: 6.2 g/dL (ref 6.0–8.3)
Total Bilirubin: 1.3 mg/dL — ABNORMAL HIGH (ref 0.2–1.2)

## 2014-09-12 LAB — LIPID PANEL
Cholesterol: 157 mg/dL (ref 0–200)
HDL: 64 mg/dL (ref >=46)
LDL Cholesterol: 78 mg/dL (ref 0–99)
TRIGLYCERIDES: 74 mg/dL (ref <150)
Total CHOL/HDL Ratio: 2.5 Ratio
VLDL: 15 mg/dL (ref 0–40)

## 2014-09-12 LAB — MAGNESIUM: MAGNESIUM: 1.8 mg/dL (ref 1.5–2.5)

## 2014-09-12 MED ORDER — FLECAINIDE ACETATE 100 MG PO TABS: 100.0000 mg | ORAL_TABLET | Freq: Two times a day (BID) | ORAL | Status: DC

## 2014-09-12 NOTE — Progress Notes (Signed)
Patient ID: Brittney Valentine, female   DOB: Jul 15, 1940, 74 y.o.   MRN: [id number]  Assessment and Plan:  Hypertension:  -Continue medication,  -monitor blood pressure at home.  -Continue DASH diet.   -Reminder to go to the ER if any CP, SOB, nausea, dizziness, severe HA, changes vision/speech, left arm numbness and tingling, and jaw pain.  Cholesterol: -Continue diet and exercise.  -Check cholesterol.   Pre-diabetes: -Continue diet and exercise.  -Check A1C  Vitamin D Def: -check level -continue medications.   Continue diet and meds as discussed. Further disposition pending results of labs.  HPI 74 y.o. female  presents for 3 month follow up with hypertension, hyperlipidemia, prediabetes and vitamin D.   Her blood pressure has not been controlled at home, today their BP is BP: (!) 142/86 mmHg.   She does not workout.  She is just trying to go back to walking now. She denies chest pain, shortness of breath, dizziness.   She is not on cholesterol medication and denies myalgias. Her cholesterol is at goal. The cholesterol last visit was:   Lab Results  Component Value Date   CHOL 127 12/19/2013   HDL 52 12/19/2013   LDLCALC 63 12/19/2013   TRIG 61 12/19/2013   CHOLHDL 2.4 12/19/2013     She has not been working on diet and exercise for prediabetes, and denies foot ulcerations, hyperglycemia, hypoglycemia , increased appetite, nausea, paresthesia of the feet, polydipsia, polyuria, visual disturbances, vomiting and weight loss. Last A1C in the office was:  Lab Results  Component Value Date   HGBA1C 5.5 12/19/2013    Patient is on Vitamin D supplement.  Lab Results  Component Value Date   VD25OH 39 12/19/2013     Patient with PMH of palpitations.  Patient on losartan with some relief.  Patient seeing Dr. Irish Lack and is happy with his care.  She is due to see him in June.  BP has been running mildly elevated at home.  She is interested in possibly seeing a dietician so she can  lose some weight.  She believes that her weight is increasing her BP.  She would like to not add any medications to her current regimen as she is "SENSITIVE" to medications.    Current Medications:  Current Outpatient Prescriptions on File Prior to Visit  Medication Sig Dispense Refill  . Cholecalciferol (VITAMIN D-3) 1000 UNITS CAPS Take by mouth 2 (two) times daily.    Marland Kitchen levothyroxine (SYNTHROID, LEVOTHROID) 112 MCG tablet Take 1 tablet (112 mcg total) by mouth every morning. 90 tablet 4  . losartan (COZAAR) 100 MG tablet Take 1 tablet (100 mg total) by mouth daily. 90 tablet 3  . Magnesium 250 MG TABS Take by mouth daily.     No current facility-administered medications on file prior to visit.    Medical History:  Past Medical History  Diagnosis Date  . Shingles   . Diverticulosis   . Hemorrhoids   . Portal hypertensive gastropathy   . Varices, esophageal   . Obesity   . Cirrhosis   . Zoster   . Partial small bowel obstruction   . Anemia   . Blood transfusion without reported diagnosis   . Heart murmur   . Thyroid disease   . Personal history of colonic polyps 03/25/2012    8 mm rectal adenoma 03/2012  . Hypertension   . Kidney stones   . Paroxysmal atrial fibrillation   . Degenerative disk disease  Allergies:  Allergies  Allergen Reactions  . Ciprofloxacin     Swelling  . Robaxin [Methocarbamol]     palpiations  . Sulfonamide Derivatives     See little dots, skin feels like its burning  . Verapamil     palpitations     Review of Systems:  Review of Systems  Constitutional: Negative for fever, chills and malaise/fatigue.  HENT: Positive for congestion. Negative for ear discharge, sore throat and tinnitus.   Eyes: Negative.   Respiratory: Negative for cough, sputum production, shortness of breath, wheezing and stridor.   Cardiovascular: Negative for chest pain, palpitations and claudication.  Gastrointestinal: Negative for heartburn, nausea, vomiting,  abdominal pain, diarrhea, constipation, blood in stool and melena.  Genitourinary: Negative for dysuria, urgency, frequency and hematuria.  Skin: Negative.   Neurological: Negative for dizziness, sensory change, speech change, focal weakness, loss of consciousness, weakness and headaches.    Family history- Review and unchanged  Social history- Review and unchanged  Physical Exam: BP 142/86 mmHg  Pulse 62  Temp(Src) 98.2 F (36.8 C) (Temporal)  Resp 18  Ht 5' 5.75" (1.67 m)  Wt 246 lb (111.585 kg)  BMI 40.01 kg/m2 Wt Readings from Last 3 Encounters:  09/12/14 246 lb (111.585 kg)  06/22/14 233 lb (105.688 kg)  05/10/14 234 lb 6.4 oz (106.323 kg)    General Appearance: Well nourished well developed, in no apparent distress. Eyes: PERRLA, EOMs, conjunctiva no swelling or erythema ENT/Mouth: Ear canals normal without obstruction, swelling, erythma, discharge.  TMs normal bilaterally.  Oropharynx moist, clear, without exudate, or postoropharyngeal swelling.  Hearing aids bilaterally Neck: Supple, thyroid normal,no cervical adenopathy  Respiratory: Respiratory effort normal, Breath sounds clear A&P without rhonchi, wheeze, or rale.  No retractions, no accessory usage. Cardio: RRR with no MRGs. Brisk peripheral pulses without edema.  2+ right radial pulse.  No peripheral pitting edema  Abdomen: Obese, Soft, + BS,  Non tender, no guarding, rebound, hernias, masses. Musculoskeletal: Full ROM, 5/5 strength, Normal gait Skin: Warm, dry without rashes, lesions, ecchymosis.  Neuro: Awake and oriented X 3, Cranial nerves intact. Normal muscle tone, no cerebellar symptoms. Psych: Normal affect, Insight and Judgment appropriate.    FORCUCCI, Eual Lindstrom, PA-C 3:36 PM [facility]

## 2014-09-12 NOTE — Patient Instructions (Signed)

## 2014-09-13 ENCOUNTER — Ambulatory Visit: Payer: Self-pay | Admitting: Internal Medicine

## 2014-09-13 LAB — INSULIN, FASTING: Insulin fasting, serum: 56.1 u[IU]/mL — ABNORMAL HIGH (ref 2.0–19.6)

## 2014-09-13 LAB — VITAMIN D 25 HYDROXY (VIT D DEFICIENCY, FRACTURES): Vit D, 25-Hydroxy: 27 ng/mL — ABNORMAL LOW (ref 30–100)

## 2014-09-19 ENCOUNTER — Other Ambulatory Visit: Payer: Self-pay

## 2014-09-19 MED ORDER — LEVOTHYROXINE SODIUM 112 MCG PO TABS: 112.0000 ug | ORAL_TABLET | Freq: Every morning | ORAL | Status: DC

## 2014-10-01 ENCOUNTER — Encounter (HOSPITAL_COMMUNITY): Payer: Self-pay | Admitting: Emergency Medicine

## 2014-10-01 ENCOUNTER — Emergency Department (HOSPITAL_COMMUNITY): Payer: PPO

## 2014-10-01 ENCOUNTER — Emergency Department (HOSPITAL_COMMUNITY)
Admission: EM | Admit: 2014-10-01 | Discharge: 2014-10-02 | Disposition: A | Discharge status: Home / Self Care | Payer: PPO | Attending: Emergency Medicine | Admitting: Emergency Medicine

## 2014-10-01 DIAGNOSIS — E079 Disorder of thyroid, unspecified: Secondary | ICD-10-CM | POA: Insufficient documentation

## 2014-10-01 DIAGNOSIS — R109 Unspecified abdominal pain: Secondary | ICD-10-CM

## 2014-10-01 DIAGNOSIS — K5900 Constipation, unspecified: Secondary | ICD-10-CM | POA: Insufficient documentation

## 2014-10-01 DIAGNOSIS — Z862 Personal history of diseases of the blood and blood-forming organs and certain disorders involving the immune mechanism: Secondary | ICD-10-CM | POA: Insufficient documentation

## 2014-10-01 DIAGNOSIS — Z8619 Personal history of other infectious and parasitic diseases: Secondary | ICD-10-CM | POA: Diagnosis not present

## 2014-10-01 DIAGNOSIS — Z79899 Other long term (current) drug therapy: Secondary | ICD-10-CM | POA: Diagnosis not present

## 2014-10-01 DIAGNOSIS — Z9889 Other specified postprocedural states: Secondary | ICD-10-CM | POA: Insufficient documentation

## 2014-10-01 DIAGNOSIS — Z90710 Acquired absence of both cervix and uterus: Secondary | ICD-10-CM | POA: Insufficient documentation

## 2014-10-01 DIAGNOSIS — Z8719 Personal history of other diseases of the digestive system: Secondary | ICD-10-CM | POA: Insufficient documentation

## 2014-10-01 DIAGNOSIS — R1084 Generalized abdominal pain: Secondary | ICD-10-CM | POA: Diagnosis not present

## 2014-10-01 DIAGNOSIS — Z86010 Personal history of colonic polyps: Secondary | ICD-10-CM | POA: Diagnosis not present

## 2014-10-01 DIAGNOSIS — Z87442 Personal history of urinary calculi: Secondary | ICD-10-CM | POA: Insufficient documentation

## 2014-10-01 DIAGNOSIS — R011 Cardiac murmur, unspecified: Secondary | ICD-10-CM | POA: Diagnosis not present

## 2014-10-01 DIAGNOSIS — Z9049 Acquired absence of other specified parts of digestive tract: Secondary | ICD-10-CM | POA: Insufficient documentation

## 2014-10-01 DIAGNOSIS — Z9089 Acquired absence of other organs: Secondary | ICD-10-CM | POA: Insufficient documentation

## 2014-10-01 DIAGNOSIS — E669 Obesity, unspecified: Secondary | ICD-10-CM | POA: Insufficient documentation

## 2014-10-01 DIAGNOSIS — I10 Essential (primary) hypertension: Secondary | ICD-10-CM | POA: Diagnosis not present

## 2014-10-01 LAB — CBC WITH DIFFERENTIAL/PLATELET
Basophils Absolute: 0 10*3/uL (ref 0.0–0.1)
Basophils Relative: 1 % (ref 0–1)
EOS PCT: 2 % (ref 0–5)
Eosinophils Absolute: 0.1 10*3/uL (ref 0.0–0.7)
HCT: 45 % (ref 36.0–46.0)
Hemoglobin: 14.7 g/dL (ref 12.0–15.0)
LYMPHS ABS: 0.9 10*3/uL (ref 0.7–4.0)
LYMPHS PCT: 22 % (ref 12–46)
MCH: 31.3 pg (ref 26.0–34.0)
MCHC: 32.7 g/dL (ref 30.0–36.0)
MCV: 95.7 fL (ref 78.0–100.0)
MONO ABS: 0.4 10*3/uL (ref 0.1–1.0)
Monocytes Relative: 10 % (ref 3–12)
NEUTROS ABS: 2.8 10*3/uL (ref 1.7–7.7)
NEUTROS PCT: 67 % (ref 43–77)
Platelets: 122 10*3/uL — ABNORMAL LOW (ref 150–400)
RBC: 4.7 MIL/uL (ref 3.87–5.11)
RDW: 15 % (ref 11.5–15.5)
WBC: 4.2 10*3/uL (ref 4.0–10.5)

## 2014-10-01 LAB — COMPREHENSIVE METABOLIC PANEL
ALT: 62 U/L — ABNORMAL HIGH (ref 0–35)
AST: 112 U/L — ABNORMAL HIGH (ref 0–37)
Albumin: 3.5 g/dL (ref 3.5–5.2)
Alkaline Phosphatase: 112 U/L (ref 39–117)
Anion gap: 10 (ref 5–15)
BILIRUBIN TOTAL: 1.3 mg/dL — AB (ref 0.3–1.2)
BUN: 20 mg/dL (ref 6–23)
CO2: 20 mmol/L (ref 19–32)
Calcium: 9.7 mg/dL (ref 8.4–10.5)
Chloride: 111 mmol/L (ref 96–112)
Creatinine, Ser: 0.82 mg/dL (ref 0.50–1.10)
GFR calc non Af Amer: 70 mL/min — ABNORMAL LOW (ref >90)
GFR, EST AFRICAN AMERICAN: 81 mL/min — AB (ref >90)
Glucose, Bld: 123 mg/dL — ABNORMAL HIGH (ref 70–99)
POTASSIUM: 4.6 mmol/L (ref 3.5–5.1)
SODIUM: 141 mmol/L (ref 135–145)
Total Protein: 7.3 g/dL (ref 6.0–8.3)

## 2014-10-01 LAB — LIPASE, BLOOD: LIPASE: 50 U/L (ref 11–59)

## 2014-10-01 MED ORDER — SODIUM CHLORIDE 0.9 % IV SOLN
INTRAVENOUS | Status: DC
  Administered 2014-10-01: 22:00:00 via INTRAVENOUS

## 2014-10-01 MED ORDER — SODIUM CHLORIDE 0.9 % IV BOLUS (SEPSIS)
1000.0000 mL | Freq: Once | INTRAVENOUS | Status: AC
Start: 2014-10-01 — End: 2014-10-02
  Administered 2014-10-01: 1000 mL via INTRAVENOUS

## 2014-10-01 MED ORDER — MORPHINE SULFATE 4 MG/ML IJ SOLN
4.0000 mg | Freq: Once | INTRAMUSCULAR | Status: AC
  Administered 2014-10-01: 4 mg via INTRAVENOUS
  Filled 2014-10-01: qty 1

## 2014-10-01 MED ORDER — ONDANSETRON HCL 4 MG/2ML IJ SOLN
4.0000 mg | Freq: Once | INTRAMUSCULAR | Status: AC
  Administered 2014-10-01: 4 mg via INTRAVENOUS
  Filled 2014-10-01: qty 2

## 2014-10-01 NOTE — ED Notes (Signed)
Pt reports diffuse, severe abdominal pain. In apparent distress. Has had bowel obstruction in the past and says this feels very similar. Does not take narcotic pain medication and has not had any type of laxative recently. Last normal BM was midday but feels like she needs to have a BM at this time. Denies emesis or diarrhea. No other c/c.

## 2014-10-01 NOTE — ED Notes (Signed)
Bed: WA15 Expected date:  Expected time:  Means of arrival:  Comments: Hold for triage 2 

## 2014-10-01 NOTE — ED Provider Notes (Signed)
CSN: [id number]     Arrival date & time 10/01/14  2034 History   First MD Initiated Contact with Patient 10/01/14 2120     Chief Complaint  Patient presents with  . Abdominal Pain  . Nausea  . Possible bowel obstruction   . Constipation     (Consider location/radiation/quality/duration/timing/severity/associated sxs/prior Treatment) HPI Comments: Patient here with diffuse abdominal pain which began 7 hours prior to arrival here. Pain is similar to what she had a bowel obstruction the past. Had emesis times once was was bilious. Denies any fever or chills. Denies any blood in her stools. Nothing makes her symptoms better or worse. Symptoms persistent and no medications taken for this prior to arrival  Patient is a 74 y.o. female presenting with abdominal pain and constipation. The history is provided by the patient.  Abdominal Pain Associated symptoms: constipation   Constipation Associated symptoms: abdominal pain     Past Medical History  Diagnosis Date  . Shingles   . Diverticulosis   . Hemorrhoids   . Portal hypertensive gastropathy   . Varices, esophageal   . Obesity   . Cirrhosis   . Zoster   . Partial small bowel obstruction   . Anemia   . Blood transfusion without reported diagnosis   . Heart murmur   . Thyroid disease   . Personal history of colonic polyps 03/25/2012    8 mm rectal adenoma 03/2012  . Hypertension   . Kidney stones   . Paroxysmal atrial fibrillation   . Degenerative disk disease    Past Surgical History  Procedure Laterality Date  . Esophagogastroduodenoscopy  multiple  . Colonoscopy    . Abdominal hysterectomy    . Cholecystectomy  2000  . Appendectomy  1991  . Colon surgery    . Colon surgery     Family History  Problem Relation Age of Onset  . Heart failure Mother     died from  . Hypertension Mother   . Heart attack Father     died from  . Hypertension Sister    History  Substance Use Topics  . Smoking status: Never Smoker    . Smokeless tobacco: Never Used  . Alcohol Use: No   OB History    No data available     Review of Systems  Gastrointestinal: Positive for abdominal pain and constipation.  All other systems reviewed and are negative.     Allergies  Ciprofloxacin; Robaxin; Sulfonamide derivatives; and Verapamil  Home Medications   Prior to Admission medications   Medication Sig Start Date End Date Taking? Authorizing Provider  Cholecalciferol (VITAMIN D-3) 1000 UNITS CAPS Take by mouth 2 (two) times daily.    Historical Provider, MD  flecainide (TAMBOCOR) 100 MG tablet Take 1 tablet (100 mg total) by mouth 2 (two) times daily. 09/12/14   Courtney Forcucci, PA-C  flecainide (TAMBOCOR) 100 MG tablet Take 1 tablet (100 mg total) by mouth 2 (two) times daily. 09/12/14   Courtney Forcucci, PA-C  levothyroxine (SYNTHROID, LEVOTHROID) 112 MCG tablet Take 1 tablet (112 mcg total) by mouth every morning. 09/19/14   Unk Pinto, MD  losartan (COZAAR) 100 MG tablet Take 1 tablet (100 mg total) by mouth daily. 07/20/14   Jettie Booze, MD  Magnesium 250 MG TABS Take by mouth daily.    Historical Provider, MD   BP 168/51 mmHg  Pulse 78  Temp(Src) 97.9 F (36.6 C) (Oral)  Resp 20  SpO2 97% Physical Exam  Constitutional: She  is oriented to person, place, and time. She appears well-developed and well-nourished.  Non-toxic appearance. No distress.  HENT:  Head: Normocephalic and atraumatic.  Eyes: Conjunctivae, EOM and lids are normal. Pupils are equal, round, and reactive to light.  Neck: Normal range of motion. Neck supple. No tracheal deviation present. No thyroid mass present.  Cardiovascular: Normal rate, regular rhythm and normal heart sounds.  Exam reveals no gallop.   No murmur heard. Pulmonary/Chest: Effort normal and breath sounds normal. No stridor. No respiratory distress. She has no decreased breath sounds. She has no wheezes. She has no rhonchi. She has no rales.  Abdominal: Soft. Normal  appearance and bowel sounds are normal. She exhibits no distension. There is generalized tenderness. There is no rigidity, no rebound and no CVA tenderness.  Musculoskeletal: Normal range of motion. She exhibits no edema or tenderness.  Neurological: She is alert and oriented to person, place, and time. She has normal strength. No cranial nerve deficit or sensory deficit. GCS eye subscore is 4. GCS verbal subscore is 5. GCS motor subscore is 6.  Skin: Skin is warm and dry. No abrasion and no rash noted.  Psychiatric: She has a normal mood and affect. Her speech is normal and behavior is normal.  Nursing note and vitals reviewed.   ED Course  Procedures (including critical care time) Labs Review Labs Reviewed  CBC WITH DIFFERENTIAL/PLATELET  COMPREHENSIVE METABOLIC PANEL  LIPASE, BLOOD  URINALYSIS, ROUTINE W REFLEX MICROSCOPIC    Imaging Review No results found.   EKG Interpretation None      MDM   Final diagnoses:  Abdominal pain    Acute abd series shows no free air or bowel obstruction, abd ct ordered and signed out to dr. Junita Push, MD 10/01/14 2328

## 2014-10-02 ENCOUNTER — Encounter (HOSPITAL_COMMUNITY): Payer: Self-pay

## 2014-10-02 ENCOUNTER — Emergency Department (HOSPITAL_COMMUNITY): Payer: PPO

## 2014-10-02 LAB — URINALYSIS, ROUTINE W REFLEX MICROSCOPIC
Bilirubin Urine: NEGATIVE
GLUCOSE, UA: NEGATIVE mg/dL
Hgb urine dipstick: NEGATIVE
Ketones, ur: NEGATIVE mg/dL
Leukocytes, UA: NEGATIVE
Nitrite: NEGATIVE
PH: 6 (ref 5.0–8.0)
PROTEIN: NEGATIVE mg/dL
SPECIFIC GRAVITY, URINE: 1.023 (ref 1.005–1.030)
Urobilinogen, UA: 0.2 mg/dL (ref 0.0–1.0)

## 2014-10-02 MED ORDER — DOCUSATE SODIUM 100 MG PO CAPS: 100.0000 mg | ORAL_CAPSULE | Freq: Two times a day (BID) | ORAL | Status: DC

## 2014-10-02 MED ORDER — IOHEXOL 300 MG/ML  SOLN
50.0000 mL | Freq: Once | INTRAMUSCULAR | Status: AC | PRN
  Administered 2014-10-02: 25 mL via ORAL

## 2014-10-02 MED ORDER — IOHEXOL 300 MG/ML  SOLN
100.0000 mL | Freq: Once | INTRAMUSCULAR | Status: AC | PRN
  Administered 2014-10-02: 100 mL via INTRAVENOUS

## 2014-10-02 MED ORDER — POLYETHYLENE GLYCOL 3350 17 G PO PACK
17.0000 g | PACK | Freq: Every day | ORAL | Status: DC
Start: 2014-10-02 — End: 2014-12-21

## 2014-10-02 NOTE — Discharge Instructions (Signed)
Your CT scan today showed no signs of small bowel obstruction.  Your x-ray did reflect some constipation.  Please take medications as prescribed.  Drink plenty of fluids.  Adding fiber and/or prune juice may help with your constipation.  Please follow-up with your doctor this week for recheck.  Return to the emergency department for fever, vomiting or other new concerning symptoms.   Abdominal Pain Many things can cause abdominal pain. Usually, abdominal pain is not caused by a disease and will improve without treatment. It can often be observed and treated at home. Your health care provider will do a physical exam and possibly order blood tests and X-rays to help determine the seriousness of your pain. However, in many cases, more time must pass before a clear cause of the pain can be found. Before that point, your health care provider may not know if you need more testing or further treatment. HOME CARE INSTRUCTIONS  Monitor your abdominal pain for any changes. The following actions may help to alleviate any discomfort you are experiencing:  Only take over-the-counter or prescription medicines as directed by your health care provider.  Do not take laxatives unless directed to do so by your health care provider.  Try a clear liquid diet (broth, tea, or water) as directed by your health care provider. Slowly move to a bland diet as tolerated. SEEK MEDICAL CARE IF:  You have unexplained abdominal pain.  You have abdominal pain associated with nausea or diarrhea.  You have pain when you urinate or have a bowel movement.  You experience abdominal pain that wakes you in the night.  You have abdominal pain that is worsened or improved by eating food.  You have abdominal pain that is worsened with eating fatty foods.  You have a fever. SEEK IMMEDIATE MEDICAL CARE IF:   Your pain does not go away within 2 hours.  You keep throwing up (vomiting).  Your pain is felt only in portions of the  abdomen, such as the right side or the left lower portion of the abdomen.  You pass bloody or black tarry stools. MAKE SURE YOU:  Understand these instructions.   Will watch your condition.   Will get help right away if you are not doing well or get worse.  Document Released: 03/05/2005 Document Revised: 05/31/2013 Document Reviewed: 02/02/2013 [facility] Patient Information 2015 [place], [place]. This information is not intended to replace advice given to you by your health care provider. Make sure you discuss any questions you have with your health care provider.

## 2014-10-02 NOTE — ED Notes (Signed)
Patient transported to CT 

## 2014-10-02 NOTE — ED Provider Notes (Signed)
Care assumed from Dr. Zenia Resides at change of shift.  Patient with abdominal pain for 7 hours, has history of small bowel obstruction.  One episode of vomiting.  X-ray shows no signs of air-fluid levels, but is awaiting CT scan to rule out small bowel structure.  Labs otherwise unremarkable.  Results for orders placed or performed during the hospital encounter of 10/01/14  CBC with Differential/Platelet  Result Value Ref Range   WBC 4.2 4.0 - 10.5 K/uL   RBC 4.70 3.87 - 5.11 MIL/uL   Hemoglobin 14.7 12.0 - 15.0 g/dL   HCT 45.0 36.0 - 46.0 %   MCV 95.7 78.0 - 100.0 fL   MCH 31.3 26.0 - 34.0 pg   MCHC 32.7 30.0 - 36.0 g/dL   RDW 15.0 11.5 - 15.5 %   Platelets 122 (L) 150 - 400 K/uL   Neutrophils Relative % 67 43 - 77 %   Neutro Abs 2.8 1.7 - 7.7 K/uL   Lymphocytes Relative 22 12 - 46 %   Lymphs Abs 0.9 0.7 - 4.0 K/uL   Monocytes Relative 10 3 - 12 %   Monocytes Absolute 0.4 0.1 - 1.0 K/uL   Eosinophils Relative 2 0 - 5 %   Eosinophils Absolute 0.1 0.0 - 0.7 K/uL   Basophils Relative 1 0 - 1 %   Basophils Absolute 0.0 0.0 - 0.1 K/uL  Comprehensive metabolic panel  Result Value Ref Range   Sodium 141 135 - 145 mmol/L   Potassium 4.6 3.5 - 5.1 mmol/L   Chloride 111 96 - 112 mmol/L   CO2 20 19 - 32 mmol/L   Glucose, Bld 123 (H) 70 - 99 mg/dL   BUN 20 6 - 23 mg/dL   Creatinine, Ser 0.82 0.50 - 1.10 mg/dL   Calcium 9.7 8.4 - 10.5 mg/dL   Total Protein 7.3 6.0 - 8.3 g/dL   Albumin 3.5 3.5 - 5.2 g/dL   AST 112 (H) 0 - 37 U/L   ALT 62 (H) 0 - 35 U/L   Alkaline Phosphatase 112 39 - 117 U/L   Total Bilirubin 1.3 (H) 0.3 - 1.2 mg/dL   GFR calc non Af Amer 70 (L) >90 mL/min   GFR calc Af Amer 81 (L) >90 mL/min   Anion gap 10 5 - 15  Lipase, blood  Result Value Ref Range   Lipase 50 11 - 59 U/L  Urinalysis, Routine w reflex microscopic  Result Value Ref Range   Color, Urine YELLOW YELLOW   APPearance CLEAR CLEAR   Specific Gravity, Urine 1.023 1.005 - 1.030   pH 6.0 5.0 - 8.0    Glucose, UA NEGATIVE NEGATIVE mg/dL   Hgb urine dipstick NEGATIVE NEGATIVE   Bilirubin Urine NEGATIVE NEGATIVE   Ketones, ur NEGATIVE NEGATIVE mg/dL   Protein, ur NEGATIVE NEGATIVE mg/dL   Urobilinogen, UA 0.2 0.0 - 1.0 mg/dL   Nitrite NEGATIVE NEGATIVE   Leukocytes, UA NEGATIVE NEGATIVE   Ct Abdomen Pelvis W Contrast  10/02/2014   CLINICAL DATA:  Initial evaluation for acute severe abdominal pain. History of cirrhosis, diverticulosis, partial SBO, renal stones, hysterectomy, appendectomy, colon surgery, cholecystectomy.  EXAM: CT ABDOMEN AND PELVIS WITH CONTRAST  TECHNIQUE: Multidetector CT imaging of the abdomen and pelvis was performed using the standard protocol following bolus administration of intravenous contrast.  CONTRAST:  128mL OMNIPAQUE IOHEXOL 300 MG/ML  SOLN  COMPARISON:  Prior CT from 08/26/2011.  FINDINGS: The visualized lung bases clear. No pleural or pericardial effusion. Minimal linear  atelectasis present within the lung bases.  Changes related to cirrhosis again seen within the liver. Subcentimeter hypodense lesion at the hepatic dome is similar to prior study, too small the characterize. No other focal intrahepatic masses. Gallbladder is absent. No biliary dilatation. Spleen is enlarged measuring 17.3 cm in craniocaudad dimension. There are extensive varices within the upper abdomen including multiple paraesophageal varices.  Adrenal glands and pancreas within normal limits.  Kidneys demonstrate symmetric enhancement. Scattered hypodense lesions within the left kidney most likely reflect small cyst. No nephrolithiasis or hydronephrosis. No radiopaque calculi seen along the course of either renal collecting system. There is no hydroureter.  Stomach within normal limits. No evidence for bowel obstruction. No abnormal wall thickening, mucosal enhancement, or inflammatory fat stranding seen about the bowels. Appendix not visualize, compatible with prior appendectomy. There is sigmoid  diverticulosis without acute diverticulitis. The colon appears adherent to the anterior abdominal wall within the lower abdomen/pelvis, likely related to underlying adhesive disease. Suture material present about the bowel in the right abdomen.  Bladder is largely decompressed but grossly normal. Bladder is likely affected by adhesive disease as well as it appears tacked anteriorly to the abdominal wall.  Uterus and ovaries not visualized.  No free air. No ascites. Mildly prominent porta hepatis lymph nodes measuring up to 1 cm in short access, like related intrinsic liver disease. No other adenopathy within the abdomen and pelvis. Normal intravascular enhancement seen throughout the abdomen and pelvis. Mild atheromatous plaque present within the intra-abdominal aorta. No aneurysm.  No acute osseous abnormality. No worrisome lytic or blastic osseous lesions.  IMPRESSION: 1. No acute intra-abdominal or pelvic process identified. 2. 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 3. Sigmoid diverticulosis without acute diverticulitis.   Electronically Signed   By: Jeannine Boga M.D.   On: 10/02/2014 03:03   Dg Abd Acute W/chest  10/01/2014   CLINICAL DATA:  Acute onset of lower abdominal pain and chest discomfort. Constipation. Initial encounter.  EXAM: DG ABDOMEN ACUTE W/ 1V CHEST  COMPARISON:  Chest radiograph performed 04/01/2012, and abdominal radiograph performed 04/02/2012  FINDINGS: The lungs are well-aerated. Mild vascular congestion is noted. There is no evidence of focal opacification, pleural effusion or pneumothorax. The cardiomediastinal silhouette is borderline normal in size.  The visualized bowel gas pattern is unremarkable. Scattered stool and air are seen within the colon; there is no evidence of small bowel dilatation to suggest obstruction. No free intra-abdominal air is identified on the provided upright  view. Clips are noted within the right upper quadrant, reflecting prior cholecystectomy.  No acute osseous abnormalities are seen; the sacroiliac joints are unremarkable in appearance.  IMPRESSION: 1. Unremarkable bowel gas pattern; no free intra-abdominal air seen. Small to moderate amount of stool noted in the colon. 2. Mild vascular congestion; lungs remain grossly clear.   Electronically Signed   By: Garald Balding M.D.   On: 10/01/2014 23:07      Linton Flemings, MD 10/02/14 [phone]

## 2014-12-20 ENCOUNTER — Encounter: Payer: Self-pay | Admitting: Emergency Medicine

## 2014-12-20 ENCOUNTER — Encounter: Payer: Self-pay | Admitting: Physician Assistant

## 2014-12-20 DIAGNOSIS — Z0000 Encounter for general adult medical examination without abnormal findings: Secondary | ICD-10-CM | POA: Insufficient documentation

## 2014-12-21 ENCOUNTER — Ambulatory Visit (INDEPENDENT_AMBULATORY_CARE_PROVIDER_SITE_OTHER): Payer: PPO | Admitting: Physician Assistant

## 2014-12-21 ENCOUNTER — Encounter: Payer: Self-pay | Admitting: Physician Assistant

## 2014-12-21 VITALS — BP 140/88 | HR 60 | Temp 97.7°F | Resp 16 | Ht 65.0 in | Wt 242.0 lb

## 2014-12-21 DIAGNOSIS — R7303 Prediabetes: Secondary | ICD-10-CM

## 2014-12-21 DIAGNOSIS — Z8719 Personal history of other diseases of the digestive system: Secondary | ICD-10-CM

## 2014-12-21 DIAGNOSIS — G4733 Obstructive sleep apnea (adult) (pediatric): Secondary | ICD-10-CM

## 2014-12-21 DIAGNOSIS — K7469 Other cirrhosis of liver: Secondary | ICD-10-CM

## 2014-12-21 DIAGNOSIS — E559 Vitamin D deficiency, unspecified: Secondary | ICD-10-CM

## 2014-12-21 DIAGNOSIS — Z860100 Personal history of colon polyps, unspecified: Secondary | ICD-10-CM

## 2014-12-21 DIAGNOSIS — Z1331 Encounter for screening for depression: Secondary | ICD-10-CM

## 2014-12-21 DIAGNOSIS — K766 Portal hypertension: Secondary | ICD-10-CM

## 2014-12-21 DIAGNOSIS — I8500 Esophageal varices without bleeding: Secondary | ICD-10-CM

## 2014-12-21 DIAGNOSIS — M5432 Sciatica, left side: Secondary | ICD-10-CM

## 2014-12-21 DIAGNOSIS — Z23 Encounter for immunization: Secondary | ICD-10-CM

## 2014-12-21 DIAGNOSIS — I10 Essential (primary) hypertension: Secondary | ICD-10-CM | POA: Insufficient documentation

## 2014-12-21 DIAGNOSIS — E039 Hypothyroidism, unspecified: Secondary | ICD-10-CM | POA: Insufficient documentation

## 2014-12-21 DIAGNOSIS — Z0000 Encounter for general adult medical examination without abnormal findings: Secondary | ICD-10-CM

## 2014-12-21 DIAGNOSIS — I480 Paroxysmal atrial fibrillation: Secondary | ICD-10-CM

## 2014-12-21 DIAGNOSIS — E669 Obesity, unspecified: Secondary | ICD-10-CM

## 2014-12-21 DIAGNOSIS — Z79899 Other long term (current) drug therapy: Secondary | ICD-10-CM

## 2014-12-21 LAB — CBC WITH DIFFERENTIAL/PLATELET
Basophils Absolute: 0 10*3/uL (ref 0.0–0.1)
Basophils Relative: 1 % (ref 0–1)
EOS ABS: 0.1 10*3/uL (ref 0.0–0.7)
Eosinophils Relative: 3 % (ref 0–5)
HCT: 43.1 % (ref 36.0–46.0)
HEMOGLOBIN: 14.1 g/dL (ref 12.0–15.0)
LYMPHS ABS: 1.1 10*3/uL (ref 0.7–4.0)
Lymphocytes Relative: 35 % (ref 12–46)
MCH: 30.5 pg (ref 26.0–34.0)
MCHC: 32.7 g/dL (ref 30.0–36.0)
MCV: 93.1 fL (ref 78.0–100.0)
MONOS PCT: 8 % (ref 3–12)
MPV: 10.2 fL (ref 8.6–12.4)
Monocytes Absolute: 0.3 10*3/uL (ref 0.1–1.0)
Neutro Abs: 1.7 10*3/uL (ref 1.7–7.7)
Neutrophils Relative %: 53 % (ref 43–77)
Platelets: 123 10*3/uL — ABNORMAL LOW (ref 150–400)
RBC: 4.63 MIL/uL (ref 3.87–5.11)
RDW: 16.3 % — ABNORMAL HIGH (ref 11.5–15.5)
WBC: 3.2 10*3/uL — AB (ref 4.0–10.5)

## 2014-12-21 LAB — HEMOGLOBIN A1C
Hgb A1c MFr Bld: 5.6 % (ref <5.7)
Mean Plasma Glucose: 114 mg/dL (ref <117)

## 2014-12-21 NOTE — Progress Notes (Signed)
MEDICARE ANNUAL WELLNESS VISIT AND CPE  Assessment:   1. Essential hypertension - continue medications, DASH diet, exercise and monitor at home. Call if greater than 130/80.  - CBC with Differential/Platelet - BASIC METABOLIC PANEL WITH GFR - Urinalysis, Routine w reflex microscopic (not at [facility]) - Microalbumin / creatinine urine ratio  2. Prediabetes Discussed general issues about diabetes pathophysiology and management., Educational material distributed., Suggested low cholesterol diet., Encouraged aerobic exercise., Discussed foot care., Reminded to get yearly retinal exam. - Hemoglobin A1c - Insulin, fasting - [facility] DIABETES FOOT EXAM  3. Obesity Obesity with co morbidities- long discussion about weight loss, diet, and exercise - Lipid panel  4. Paroxysmal atrial fibrillation Continue medications, not on coagulation due to bleeding risk, continue cardio follow up.   5. Esophageal varices Weight loss advised, no tylenol/alochol, continue GI follow up Contraindication to anticoagulation  6. Portal hypertension Continue GI follow up  7. Other cirrhosis of liver Weight loss advised, no tylenol/alochol, continue GI follow up - Hepatic function panel - Iron and TIBC - Ferritin - Vitamin B12  8. History of small bowel obstruction monitor  9. Vitamin D deficiency - Vit D  25 hydroxy (rtn osteoporosis monitoring)  10. Medication management - Magnesium  11. Left sided sciatica Monitor, continue at home exercises, weight loss  12. History of colonic polyps Continue follow up  13. Hypothyroidism, unspecified hypothyroidism type Hypothyroidism-check TSH level, continue medications the same, reminded to take on an empty stomach 30-54mins before food.  - TSH  14. Encounter for Medicare annual wellness exam today  15. Screening for depression negative  16. OSA (obstructive sleep apnea) - Ambulatory referral to Sleep Studies - Afib, HTN, obesity, fatigue,  insomnia  17. Need for prophylactic vaccination with combined diphtheria-tetanus-pertussis (DTP) vaccine - Dt vaccine greater than 7yo IM  Over 40 minutes of exam, counseling, chart review and critical decision making was performed  Plan:   During the course of the visit the patient was educated and counseled about appropriate screening and preventive services including:    Pneumococcal vaccine   Prevnar 13  Influenza vaccine  Td vaccine  Screening electrocardiogram  Bone densitometry screening  Colorectal cancer screening  Diabetes screening  Glaucoma screening  Nutrition counseling   Advanced directives: requested  Conditions/risks identified: BMI: Discussed weight loss, diet, and increase physical activity.  Increase physical activity: AHA recommends 150 minutes of physical activity a week.  Medications reviewed Diabetes is at goal, ACE/ARB therapy: Yes. Urinary Incontinence is not an issue: discussed non pharmacology and pharmacology options.  Fall risk: low- discussed PT, home fall assessment, medications.    Subjective:  Brittney Valentine is a 74 y.o. female who presents for Medicare Annual Wellness Visit and complete physical.  Date of last medicare wellness visit  Was 12/2013  . Her blood pressure has been controlled at home, today their BP is BP: 140/88 mmHg She does workout, walks everyday for 15-30 mins. She denies chest pain, shortness of breath, dizziness.  She has a history of pAfib, NOT on anticoagulation due to bleeding risk with esophageal varices, and follows with Dr. Irish Lack.  She is not on cholesterol medication and denies myalgias. Her cholesterol is at goal. The cholesterol last visit was:   Lab Results  Component Value Date   CHOL 157 09/12/2014   HDL 64 09/12/2014   LDLCALC 78 09/12/2014   TRIG 74 09/12/2014   CHOLHDL 2.5 09/12/2014    She has been working on diet and exercise for prediabetes,  and denies paresthesia of the feet,  polydipsia, polyuria and visual disturbances. Last A1C in the office was:  Lab Results  Component Value Date   HGBA1C 5.8* 09/12/2014   Patient is on Vitamin D supplement, 1000 IU BID.   Lab Results  Component Value Date   VD25OH 27* 09/12/2014     She is on thyroid medication. Her medication was not changed last visit.   Lab Results  Component Value Date   TSH 2.455 09/12/2014   BMI is Body mass index is 40.27 kg/(m^2)., she is working on diet and exercise. Wt Readings from Last 3 Encounters:  12/21/14 242 lb (109.77 kg)  09/12/14 246 lb (111.585 kg)  06/22/14 233 lb (105.688 kg)    Medication Review: Current Outpatient Prescriptions on File Prior to Visit  Medication Sig Dispense Refill  . Cholecalciferol (VITAMIN D-3) 1000 UNITS CAPS Take 1,000 Units by mouth 2 (two) times daily.     . Cyanocobalamin (VITAMIN B 12 PO) Take 1 tablet by mouth daily.    Marland Kitchen docusate sodium (COLACE) 100 MG capsule Take 1 capsule (100 mg total) by mouth every 12 (twelve) hours. 60 capsule 0  . flecainide (TAMBOCOR) 100 MG tablet Take 1 tablet (100 mg total) by mouth 2 (two) times daily. 180 tablet 4  . levothyroxine (SYNTHROID, LEVOTHROID) 112 MCG tablet Take 1 tablet (112 mcg total) by mouth every morning. 90 tablet 4  . Magnesium 250 MG TABS Take 250 mg by mouth daily.      No current facility-administered medications on file prior to visit.    Current Problems (verified) Patient Active Problem List   Diagnosis Date Noted  . Essential hypertension 12/21/2014  . Prediabetes 12/21/2014  . Hypothyroidism 12/21/2014  . Encounter for Medicare annual wellness exam 12/20/2014  . Medication management 09/12/2014  . Vitamin D deficiency 09/12/2014  . Atrial fibrillation 05/11/2014  . 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 Left sided sciatica 02/09/2014  . History of colonic polyps 03/25/2012  . Obesity 09/27/2009  . History of small bowel obstruction 12/31/2007  . Hepatic cirrhosis 12/31/2007  .  PORTAL HYPERTENSION 12/31/2007    Screening Tests Immunization History  Administered Date(s) Administered  . Influenza Whole 03/09/2012  . Influenza,inj,Quad PF,36+ Mos 02/09/2014  . Pneumococcal Conjugate-13 12/19/2013  . Pneumococcal Polysaccharide-23 06/09/2004  . Td 06/09/2004  . Zoster 12/15/2012    Preventative care: Last colonoscopy: 2013 due 2018 Last mammogram: /062016, per pt WNL @ [facility] Last pap smear/pelvic exam: [facility] 2008 WNL, NO Abnormals, Manual 2014 WNL DEXA: 2015 Echo 08/2009  Prior vaccinations: TD or Tdap: 2006 DUE Influenza: 2013 Pneumococcal: 2006 Prevnar 13 2015 Shingles/Zostavax: 2014  Names of Other Physician/Practitioners you currently use: 1.  [facility] here for primary care 2. Dr. Gershon Crane, eye doctor, last visit 2015 3. Vivia Ewing, dentist, last visit has appointment in Nov, q 6 months Patient Care Team: Unk Pinto, MD as PCP - General (Internal Medicine) Rutherford Guys, MD as Consulting Physician (Ophthalmology) Gatha Mayer, MD as Consulting Physician (Gastroenterology) Mauricia Area, MD as Consulting Physician (Nephrology) Jettie Booze, MD as Consulting Physician (Cardiology)  SURGICAL HISTORY Past Surgical History  Procedure Laterality Date  . Esophagogastroduodenoscopy  multiple  . Colonoscopy    . Abdominal hysterectomy    . Cholecystectomy  2000  . Appendectomy  1991  . Colon surgery    . Colon surgery     FAMILY HISTORY Family History  Problem Relation Age of Onset  . Heart failure  Mother     died from  . Hypertension Mother   . Heart attack Father     died from  . Hypertension Sister    SOCIAL HISTORY History  Substance Use Topics  . Smoking status: Never Smoker   . Smokeless tobacco: Never Used  . Alcohol Use: No    MEDICARE WELLNESS OBJECTIVES: Tobacco use: She does not smoke.  Patient is not a former smoker. If yes, counseling given Alcohol Current  alcohol use: none Osteoporosis: postmenopausal estrogen deficiency and dietary calcium and/or vitamin D deficiency, History of fracture in the past year: no Fall risk: Low Risk Hearing: impaired Visual acuity: normal,  does perform annual eye exam Diet: in general, a "healthy" diet   Physical activity: Current Exercise Habits:: Home exercise routine, Type of exercise: walking, Time (Minutes): 20, Frequency (Times/Week): 6, Weekly Exercise (Minutes/Week): 120, Intensity: Mild Cardiac risk factors: Cardiac Risk Factors include: advanced age (>14men, >3 women);dyslipidemia;hypertension;obesity (BMI >30kg/m2);sedentary lifestyle Depression/mood screen:   Depression screen [facility] 2/9 12/21/2014  Decreased Interest 0  Down, Depressed, Hopeless 0  PHQ - 2 Score 0    ADLs:  In your present state of health, do you have any difficulty performing the following activities: 12/21/2014  Hearing? Y  Vision? N  Difficulty concentrating or making decisions? N  Walking or climbing stairs? N  Dressing or bathing? N  Doing errands, shopping? N  Preparing Food and eating ? N  Using the Toilet? N  In the past six months, have you accidently leaked urine? N  Do you have problems with loss of bowel control? N  Managing your Medications? N  Managing your Finances? N  Housekeeping or managing your Housekeeping? N     Cognitive Testing  Alert? Yes  Normal Appearance?Yes  Oriented to person? Yes  Place? Yes   Time? Yes  Recall of three objects?  Yes  Can perform simple calculations? Yes  Displays appropriate judgment?Yes  Can read the correct time from a watch face?Yes  EOL planning: Does patient have an advance directive?: Yes Type of Advance Directive: Healthcare Power of Attorney, Living will Does patient want to make changes to advanced directive?: No - Patient declined Copy of advanced directive(s) in chart?: No - copy requested  Review of Systems  Constitutional: Positive for malaise/fatigue.  Negative for fever, chills, weight loss and diaphoresis.  HENT: Positive for hearing loss (has hearing aids). Negative for congestion, ear discharge, ear pain, nosebleeds, sore throat and tinnitus.   Eyes: Negative.  Negative for blurred vision.  Respiratory: Positive for cough. Negative for hemoptysis, sputum production, shortness of breath, wheezing and stridor.   Cardiovascular: Positive for palpitations (occ, had a spell last night, had to take extra medication) and leg swelling. Negative for chest pain, orthopnea, claudication and PND.  Gastrointestinal: Positive for constipation (takes colace). Negative for heartburn, nausea, vomiting, abdominal pain, diarrhea, blood in stool and melena.  Genitourinary: Negative.   Musculoskeletal: Positive for back pain (with pain down left leg since shingles). Negative for myalgias, joint pain, falls and neck pain.  Skin: Negative.   Neurological: Negative.  Negative for dizziness, weakness and headaches.  Psychiatric/Behavioral: Negative for depression, suicidal ideas, hallucinations, memory loss and substance abuse. The patient has insomnia. The patient is not nervous/anxious.      Objective:     Today's Vitals   12/21/14 0911  BP: 140/88  Pulse: 60  Temp: 97.7 F (36.5 C)  Resp: 16  Height: 5\' 5"  (1.651 m)  Weight: 242 lb (  109.77 kg)   Body mass index is 40.27 kg/(m^2).  General appearance: alert, no distress, WD/WN, female HEENT: normocephalic, sclerae anicteric, TMs pearly, nares patent, no discharge or erythema, pharynx normal, crowded mouth.  Oral cavity: MMM, no lesions Neck: supple, no lymphadenopathy, no thyromegaly, no masses Heart: RRR, normal S1, S2, with systolic murmur left sternal border Lungs: CTA bilaterally, no wheezes, rhonchi, or rales Abdomen: +bs, soft, obese,  non tender, non distended, no masses, no hepatomegaly, no splenomegaly Musculoskeletal: nontender, no swelling, no obvious deformity Extremities: 1-2 + edema  edema, no cyanosis, no clubbing Pulses: 2+ symmetric, upper and lower extremities, normal cap refill Neurological: alert, oriented x 3, CN2-12 intact, strength normal upper extremities and lower extremities, sensation normal throughout, DTRs 2+ throughout, no cerebellar signs, gait normal Psychiatric: normal affect, behavior normal, pleasant   Medicare Attestation I have personally reviewed: The patient's medical and social history Their use of alcohol, tobacco or illicit drugs Their current medications and supplements The patient's functional ability including ADLs,fall risks, home safety risks, cognitive, and hearing and visual impairment Diet and physical activities Evidence for depression or mood disorders  The patient's weight, height, BMI, and visual acuity have been recorded in the chart.  I have made referrals, counseling, and provided education to the patient based on review of the above and I have provided the patient with a written personalized care plan for preventive services.     Vicie Mutters, PA-C   12/21/2014

## 2014-12-21 NOTE — Patient Instructions (Signed)
I think it is possible that you have sleep apnea. It can cause interrupted sleep, headaches, frequent awakenings, fatigue, dry mouth, fast/slow heart beats, memory issues, anxiety/depression, swelling, numbness tingling hands/feet, weight gain, shortness of breath, and the list goes on. Sleep apnea needs to be ruled out because if it is left untreated it does eventually lead to abnormal heart beats, lung failure or heart failure as well as increasing the risk of heart attack and stroke. There are masks you can wear OR a mouth piece that I can give you information about. Often times though people feel MUCH better after getting treatment.   Sleep Apnea  Sleep apnea is a sleep disorder characterized by abnormal pauses in breathing while you sleep. When your breathing pauses, the level of oxygen in your blood decreases. This causes you to move out of deep sleep and into light sleep. As a result, your quality of sleep is poor, and the system that carries your blood throughout your body (cardiovascular system) experiences stress. If sleep apnea remains untreated, the following conditions can develop:  High blood pressure (hypertension).  Coronary artery disease.  Inability to achieve or maintain an erection (impotence).  Impairment of your thought process (cognitive dysfunction). There are three types of sleep apnea: 1. Obstructive sleep apnea--Pauses in breathing during sleep because of a blocked airway. 2. Central sleep apnea--Pauses in breathing during sleep because the area of the brain that controls your breathing does not send the correct signals to the muscles that control breathing. 3. Mixed sleep apnea--A combination of both obstructive and central sleep apnea.  RISK FACTORS The following risk factors can increase your risk of developing sleep apnea:  Being overweight.  Smoking.  Having narrow passages in your nose and throat.  Being of older age.  Being female.  Alcohol use.   Sedative and tranquilizer use.  Ethnicity. Among individuals younger than 35 years, African Americans are at increased risk of sleep apnea. SYMPTOMS   Difficulty staying asleep.  Daytime sleepiness and fatigue.  Loss of energy.  Irritability.  Loud, heavy snoring.  Morning headaches.  Trouble concentrating.  Forgetfulness.  Decreased interest in sex. DIAGNOSIS  In order to diagnose sleep apnea, your caregiver will perform a physical examination. Your caregiver may suggest that you take a home sleep test. Your caregiver may also recommend that you spend the night in a sleep lab. In the sleep lab, several monitors record information about your heart, lungs, and brain while you sleep. Your leg and arm movements and blood oxygen level are also recorded. TREATMENT The following actions may help to resolve mild sleep apnea:  Sleeping on your side.   Using a decongestant if you have nasal congestion.   Avoiding the use of depressants, including alcohol, sedatives, and narcotics.   Losing weight and modifying your diet if you are overweight. There also are devices and treatments to help open your airway:  Oral appliances. These are custom-made mouthpieces that shift your lower jaw forward and slightly open your bite. This opens your airway.  Devices that create positive airway pressure. This positive pressure "splints" your airway open to help you breathe better during sleep. The following devices create positive airway pressure:  Continuous positive airway pressure (CPAP) device. The CPAP device creates a continuous level of air pressure with an air pump. The air is delivered to your airway through a mask while you sleep. This continuous pressure keeps your airway open.  Nasal expiratory positive airway pressure (EPAP) device. The EPAP device  creates positive air pressure as you exhale. The device consists of single-use valves, which are inserted into each nostril and held in  place by adhesive. The valves create very little resistance when you inhale but create much more resistance when you exhale. That increased resistance creates the positive airway pressure. This positive pressure while you exhale keeps your airway open, making it easier to breath when you inhale again.  Bilevel positive airway pressure (BPAP) device. The BPAP device is used mainly in patients with central sleep apnea. This device is similar to the CPAP device because it also uses an air pump to deliver continuous air pressure through a mask. However, with the BPAP machine, the pressure is set at two different levels. The pressure when you exhale is lower than the pressure when you inhale.  Surgery. Typically, surgery is only done if you cannot comply with less invasive treatments or if the less invasive treatments do not improve your condition. Surgery involves removing excess tissue in your airway to create a wider passage way. Document Released: 05/16/2002 Document Revised: 09/20/2012 Document Reviewed: 10/02/2011 [facility] Patient Information 2015 [place], [place]. This information is not intended to replace advice given to you by your health care provider. Make sure you discuss any questions you have with your health care provider.    Before you even begin to attack a weight-loss plan, it pays to remember this: You are not fat. You have fat. Losing weight isn't about blame or shame; it's simply another achievement to accomplish. Dieting is like any other skill-you have to buckle down and work at it. As long as you act in a smart, reasonable way, you'll ultimately get where you want to be. Here are some weight loss pearls for you.  1. It's Not a Diet. It's a Lifestyle Thinking of a diet as something you're on and suffering through only for the short term doesn't work. To shed weight and keep it off, you need to make permanent changes to the way you eat. It's OK to indulge occasionally, of course, but if  you cut calories temporarily and then revert to your old way of eating, you'll gain back the weight quicker than you can say yo-yo. Use it to lose it. Research shows that one of the best predictors of long-term weight loss is how many pounds you drop in the first month. For that reason, nutritionists often suggest being stricter for the first two weeks of your new eating strategy to build momentum. Cut out added sugar and alcohol and avoid unrefined carbs. After that, figure out how you can reincorporate them in a way that's healthy and maintainable.  2. There's a Right Way to Exercise Working out burns calories and fat and boosts your metabolism by building muscle. But those trying to lose weight are notorious for overestimating the number of calories they burn and underestimating the amount they take in. Unfortunately, your system is biologically programmed to hold on to extra pounds and that means when you start exercising, your body senses the deficit and ramps up its hunger signals. If you're not diligent, you'll eat everything you burn and then some. Use it to lose it. Cardio gets all the exercise glory, but strength and interval training are the real heroes. They help you build lean muscle, which in turn increases your metabolism and calorie-burning ability 3. Don't Overreact to Mild Hunger Some people have a hard time losing weight because of hunger anxiety. To them, being hungry is bad-something to be avoided  at all costs-so they carry snacks with them and eat when they don't need to. Others eat because they're stressed out or bored. While you never want to get to the point of being ravenous (that's when bingeing is likely to happen), a hunger pang, a craving, or the fact that it's 3:00 p.m. should not send you racing for the vending machine or obsessing about the energy bar in your purse. Ideally, you should put off eating until your stomach is growling and it's difficult to concentrate.  Use it to  lose it. When you feel the urge to eat, use the HALT method. Ask yourself, Am I really hungry? Or am I angry or anxious, lonely or bored, or tired? If you're still not certain, try the apple test. If you're truly hungry, an apple should seem delicious; if it doesn't, something else is going on. Or you can try drinking water and making yourself busy, if you are still hungry try a healthy snack.  4. Not All Calories Are Created Equal The mechanics of weight loss are pretty simple: Take in fewer calories than you use for energy. But the kind of food you eat makes all the difference. Processed food that's high in saturated fat and refined starch or sugar can cause inflammation that disrupts the hormone signals that tell your brain you're full. The result: You eat a lot more.  Use it to lose it. Clean up your diet. Swap in whole, unprocessed foods, including vegetables, lean protein, and healthy fats that will fill you up and give you the biggest nutritional bang for your calorie buck. In a few weeks, as your brain starts receiving regular hunger and fullness signals once again, you'll notice that you feel less hungry overall and naturally start cutting back on the amount you eat.  5. Protein, Produce, and Plant-Based Fats Are Your Weight-Loss Trinity Here's why eating the three Ps regularly will help you drop pounds. Protein fills you up. You need it to build lean muscle, which keeps your metabolism humming so that you can torch more fat. People in a weight-loss program who ate double the recommended daily allowance for protein (about 110 grams for a 150-pound woman) lost 70 percent of their weight from fat, while people who ate the RDA lost only about 40 percent, one study found. Produce is packed with filling fiber. "It's very difficult to consume too many calories if you're eating a lot of vegetables. Example: Three cups of broccoli is a lot of food, yet only 93 calories. (Fruit is another story. It can be  easy to overeat and can contain a lot of calories from sugar, so be sure to monitor your intake.) Plant-based fats like olive oil and those in avocados and nuts are healthy and extra satiating.  Use it to lose it. Aim to incorporate each of the three Ps into every meal and snack. People who eat protein throughout the day are able to keep weight off, according to a study in the [place] of Clinical Nutrition. In addition to meat, poultry and seafood, good sources are beans, lentils, eggs, tofu, and yogurt. As for fat, keep portion sizes in check by measuring out salad dressing, oil, and nut butters (shoot for one to two tablespoons). Finally, eat veggies or a little fruit at every meal. People who did that consumed 308 fewer calories but didn't feel any hungrier than when they didn't eat more produce.  7. How You Eat Is As Important As What You Eat  In order for your brain to register that you're full, you need to focus on what you're eating. Sit down whenever you eat, preferably at a table. Turn off the TV or computer, put down your phone, and look at your food. Smell it. Chew slowly, and don't put another bite on your fork until you swallow. When women ate lunch this attentively, they consumed 30 percent less when snacking later than those who listened to an audiobook at lunchtime, according to a study in the [place] of Nutrition. 8. Weighing Yourself Really Works The scale provides the best evidence about whether your efforts are paying off. Seeing the numbers tick up or down or stagnate is motivation to keep going-or to rethink your approach. A 2015 study at [facility] found that daily weigh-ins helped people lose more weight, keep it off, and maintain that loss, even after two years. Use it to lose it. Step on the scale at the same time every day for the best results. If your weight shoots up several pounds from one weigh-in to the next, don't freak out. Eating a lot of salt the  night before or having your period is the likely culprit. The number should return to normal in a day or two. It's a steady climb that you need to do something about. 9. Too Much Stress and Too Little Sleep Are Your Enemies When you're tired and frazzled, your body cranks up the production of cortisol, the stress hormone that can cause carb cravings. Not getting enough sleep also boosts your levels of ghrelin, a hormone associated with hunger, while suppressing leptin, a hormone that signals fullness and satiety. People on a diet who slept only five and a half hours a night for two weeks lost 55 percent less fat and were hungrier than those who slept eight and a half hours, according to a study in the [place]. Use it to lose it. Prioritize sleep, aiming for seven hours or more a night, which research shows helps lower stress. And make sure you're getting quality zzz's. If a snoring spouse or a fidgety cat wakes you up frequently throughout the night, you may end up getting the equivalent of just four hours of sleep, according to a study from [facility]. Keep pets out of the bedroom, and use a white-noise app to drown out snoring. 10. You Will Hit a plateau-And You Can Bust Through It As you slim down, your body releases much less leptin, the fullness hormone.  If you're not strength training, start right now. Building muscle can raise your metabolism to help you overcome a plateau. To keep your body challenged and burning calories, incorporate new moves and more intense intervals into your workouts or add another sweat session to your weekly routine. Alternatively, cut an extra 100 calories or so a day from your diet. Now that you've lost weight, your body simply doesn't need as much fuel.   We want weight loss that will last so you should lose 1-2 pounds a week.  THAT IS IT! Please pick THREE things a month to change. Once it is a habit check off the item. Then pick  another three items off the list to become habits.  If you are already doing a habit on the list GREAT!  Cross that item off! o Don't drink your calories. Ie, alcohol, soda, fruit juice, and sweet tea.  o Drink more water. Drink a glass when you feel hungry or before each meal.  o Eat breakfast - Complex carb and protein (likeDannon light and fit yogurt, oatmeal, fruit, eggs, [place] bacon). o Measure your cereal.  Eat no more than one cup a day. (ie [place]) o Eat an apple a day. o Add a vegetable a day. o Try a new vegetable a month. o Use Pam! Stop using oil or butter to cook. o Don't finish your plate or use smaller plates. o Share your dessert. o Eat sugar free Jello for dessert or frozen grapes. o Don't eat 2-3 hours before bed. o Switch to whole wheat bread, pasta, and brown rice. o Make healthier choices when you eat out. No fries! o Pick baked chicken, NOT fried. o Don't forget to SLOW DOWN when you eat. It is not going anywhere.  o Take the stairs. o Park far away in the parking lot o [employer] (or weights) for 10 minutes while watching TV. o Walk at work for 10 minutes during break. o Walk outside 1 time a week with your friend, kids, dog, or significant other. o Start a walking group at [place] as much as you can tolerate.  o Keep a food diary. o Weigh yourself daily. o Walk for 15 minutes 3 days per week. o Cook at home more often and eat out less.  If life happens and you go back to old habits, it is okay.  Just start over. You can do it!   If you experience chest pain, get short of breath, or tired during the exercise, please stop immediately and inform your doctor.   Preventive Care for Adults A healthy lifestyle and preventive care can promote health and wellness. Preventive health guidelines for women include the following key practices.  A routine yearly physical is a good way to check with your health care provider about your health and  preventive screening. It is a chance to share any concerns and updates on your health and to receive a thorough exam.  Visit your dentist for a routine exam and preventive care every 6 months. Brush your teeth twice a day and floss once a day. Good oral hygiene prevents tooth decay and gum disease.  The frequency of eye exams is based on your age, health, family medical history, use of contact lenses, and other factors. Follow your health care provider's recommendations for frequency of eye exams.  Eat a healthy diet. Foods like vegetables, fruits, whole grains, low-fat dairy products, and lean protein foods contain the nutrients you need without too many calories. Decrease your intake of foods high in solid fats, added sugars, and salt. Eat the right amount of calories for you.Get information about a proper diet from your health care provider, if necessary.  Regular physical exercise is one of the most important things you can do for your health. Most adults should get at least 150 minutes of moderate-intensity exercise (any activity that increases your heart rate and causes you to sweat) each week. In addition, most adults need muscle-strengthening exercises on 2 or more days a week.  Maintain a healthy weight. The body mass index (BMI) is a screening tool to identify possible weight problems. It provides an estimate of body fat based on height and weight. Your health care provider can find your BMI and can help you achieve or maintain a healthy weight.For adults 20 years and older:  A BMI below 18.5 is considered underweight.  A BMI of 18.5 to 24.9 is normal.  A BMI of 25  to 29.9 is considered overweight.  A BMI of 30 and above is considered obese.  Maintain normal blood lipids and cholesterol levels by exercising and minimizing your intake of saturated fat. Eat a balanced diet with plenty of fruit and vegetables. If your lipid or cholesterol levels are high, you are over 50, or you are at  high risk for heart disease, you may need your cholesterol levels checked more frequently.Ongoing high lipid and cholesterol levels should be treated with medicines if diet and exercise are not working.  If you smoke, find out from your health care provider how to quit. If you do not use tobacco, do not start.  Lung cancer screening is recommended for adults aged 53-80 years who are at high risk for developing lung cancer because of a history of smoking. A yearly low-dose CT scan of the lungs is recommended for people who have at least a 30-pack-year history of smoking and are a current smoker or have quit within the past 15 years. A pack year of smoking is smoking an average of 1 pack of cigarettes a day for 1 year (for example: 1 pack a day for 30 years or 2 packs a day for 15 years). Yearly screening should continue until the smoker has stopped smoking for at least 15 years. Yearly screening should be stopped for people who develop a health problem that would prevent them from having lung cancer treatment.  Avoid use of street drugs. Do not share needles with anyone. Ask for help if you need support or instructions about stopping the use of drugs.  High blood pressure causes heart disease and increases the risk of stroke.  Ongoing high blood pressure should be treated with medicines if weight loss and exercise do not work.  If you are 60-50 years old, ask your health care provider if you should take aspirin to prevent strokes.  Diabetes screening involves taking a blood sample to check your fasting blood sugar level. This should be done once every 3 years, after age 76, if you are within normal weight and without risk factors for diabetes. Testing should be considered at a younger age or be carried out more frequently if you are overweight and have at least 1 risk factor for diabetes.  Breast cancer screening is essential preventive care for women. You should practice "breast self-awareness." This  means understanding the normal appearance and feel of your breasts and may include breast self-examination. Any changes detected, no matter how small, should be reported to a health care provider. Women in their 43s and 30s should have a clinical breast exam (CBE) by a health care provider as part of a regular health exam every 1 to 3 years. After age 65, women should have a CBE every year. Starting at age 25, women should consider having a mammogram (breast X-ray test) every year. Women who have a family history of breast cancer should talk to their health care provider about genetic screening. Women at a high risk of breast cancer should talk to their health care providers about having an MRI and a mammogram every year.  Breast cancer gene (BRCA)-related cancer risk assessment is recommended for women who have family members with BRCA-related cancers. BRCA-related cancers include breast, ovarian, tubal, and peritoneal cancers. Having family members with these cancers may be associated with an increased risk for harmful changes (mutations) in the breast cancer genes BRCA1 and BRCA2. Results of the assessment will determine the need for genetic counseling and BRCA1 and  BRCA2 testing.  Routine pelvic exams to screen for cancer are no longer recommended for nonpregnant women who are considered low risk for cancer of the pelvic organs (ovaries, uterus, and vagina) and who do not have symptoms. Ask your health care provider if a screening pelvic exam is right for you.  If you have had past treatment for cervical cancer or a condition that could lead to cancer, you need Pap tests and screening for cancer for at least 20 years after your treatment. If Pap tests have been discontinued, your risk factors (such as having a new sexual partner) need to be reassessed to determine if screening should be resumed. Some women have medical problems that increase the chance of getting cervical cancer. In these cases, your  health care provider may recommend more frequent screening and Pap tests.    Colorectal cancer can be detected and often prevented. Most routine colorectal cancer screening begins at the age of 55 years and continues through age 43 years. However, your health care provider may recommend screening at an earlier age if you have risk factors for colon cancer. On a yearly basis, your health care provider may provide home test kits to check for hidden blood in the stool. Use of a small camera at the end of a tube, to directly examine the colon (sigmoidoscopy or colonoscopy), can detect the earliest forms of colorectal cancer. Talk to your health care provider about this at age 74, when routine screening begins. Direct exam of the colon should be repeated every 5-10 years through age 70 years, unless early forms of pre-cancerous polyps or small growths are found.  Osteoporosis is a disease in which the bones lose minerals and strength with aging. This can result in serious bone fractures or breaks. The risk of osteoporosis can be identified using a bone density scan. Women ages 52 years and over and women at risk for fractures or osteoporosis should discuss screening with their health care providers. Ask your health care provider whether you should take a calcium supplement or vitamin D to reduce the rate of osteoporosis.  Menopause can be associated with physical symptoms and risks. Hormone replacement therapy is available to decrease symptoms and risks. You should talk to your health care provider about whether hormone replacement therapy is right for you.  Use sunscreen. Apply sunscreen liberally and repeatedly throughout the day. You should seek shade when your shadow is shorter than you. Protect yourself by wearing long sleeves, pants, a wide-brimmed hat, and sunglasses year round, whenever you are outdoors.  Once a month, do a whole body skin exam, using a mirror to look at the skin on your back. Tell  your health care provider of new moles, moles that have irregular borders, moles that are larger than a pencil eraser, or moles that have changed in shape or color.  Stay current with required vaccines (immunizations).  Influenza vaccine. All adults should be immunized every year.  Tetanus, diphtheria, and acellular pertussis (Td, Tdap) vaccine. Pregnant women should receive 1 dose of Tdap vaccine during each pregnancy. The dose should be obtained regardless of the length of time since the last dose. Immunization is preferred during the 27th-36th week of gestation. An adult who has not previously received Tdap or who does not know her vaccine status should receive 1 dose of Tdap. This initial dose should be followed by tetanus and diphtheria toxoids (Td) booster doses every 10 years. Adults with an unknown or incomplete history of completing a 3-dose  immunization series with Td-containing vaccines should begin or complete a primary immunization series including a Tdap dose. Adults should receive a Td booster every 10 years.    Zoster vaccine. One dose is recommended for adults aged 33 years or older unless certain conditions are present.    Pneumococcal 13-valent conjugate (PCV13) vaccine. When indicated, a person who is uncertain of her immunization history and has no record of immunization should receive the PCV13 vaccine. An adult aged 52 years or older who has certain medical conditions and has not been previously immunized should receive 1 dose of PCV13 vaccine. This PCV13 should be followed with a dose of pneumococcal polysaccharide (PPSV23) vaccine. The PPSV23 vaccine dose should be obtained at least 8 weeks after the dose of PCV13 vaccine. An adult aged 72 years or older who has certain medical conditions and previously received 1 or more doses of PPSV23 vaccine should receive 1 dose of PCV13. The PCV13 vaccine dose should be obtained 1 or more years after the last PPSV23 vaccine  dose.    Pneumococcal polysaccharide (PPSV23) vaccine. When PCV13 is also indicated, PCV13 should be obtained first. All adults aged 78 years and older should be immunized. An adult younger than age 6 years who has certain medical conditions should be immunized. Any person who resides in a nursing home or long-term care facility should be immunized. An adult smoker should be immunized. People with an immunocompromised condition and certain other conditions should receive both PCV13 and PPSV23 vaccines. People with human immunodeficiency virus (HIV) infection should be immunized as soon as possible after diagnosis. Immunization during chemotherapy or radiation therapy should be avoided. Routine use of PPSV23 vaccine is not recommended for American Indians, [address], or people younger than 65 years unless there are medical conditions that require PPSV23 vaccine. When indicated, people who have unknown immunization and have no record of immunization should receive PPSV23 vaccine. One-time revaccination 5 years after the first dose of PPSV23 is recommended for people aged 19-64 years who have chronic kidney failure, nephrotic syndrome, asplenia, or immunocompromised conditions. People who received 1-2 doses of PPSV23 before age 95 years should receive another dose of PPSV23 vaccine at age 53 years or later if at least 5 years have passed since the previous dose. Doses of PPSV23 are not needed for people immunized with PPSV23 at or after age 22 years.   Preventive Services / Frequency  Ages 65 years and over  Blood pressure check.  Lipid and cholesterol check.  Lung cancer screening. / Every year if you are aged 55-80 years and have a 30-pack-year history of smoking and currently smoke or have quit within the past 15 years. Yearly screening is stopped once you have quit smoking for at least 15 years or develop a health problem that would prevent you from having lung cancer treatment.  Clinical  breast exam.** / Every year after age 10 years.  BRCA-related cancer risk assessment.** / For women who have family members with a BRCA-related cancer (breast, ovarian, tubal, or peritoneal cancers).  Mammogram.** / Every year beginning at age 60 years and continuing for as long as you are in good health. Consult with your health care provider.  Pap test.** / Every 3 years starting at age 49 years through age 69 or 54 years with 3 consecutive normal Pap tests. Testing can be stopped between 65 and 70 years with 3 consecutive normal Pap tests and no abnormal Pap or HPV tests in the past 10 years.  Fecal occult blood test (FOBT) of stool. / Every year beginning at age 3 years and continuing until age 50 years. You may not need to do this test if you get a colonoscopy every 10 years.  Flexible sigmoidoscopy or colonoscopy.** / Every 5 years for a flexible sigmoidoscopy or every 10 years for a colonoscopy beginning at age 60 years and continuing until age 31 years.  Hepatitis C blood test.** / For all people born from [birth year] through 1963 and any individual with known risks for hepatitis C.  Osteoporosis screening.** / A one-time screening for women ages 46 years and over and women at risk for fractures or osteoporosis.  Skin self-exam. / Monthly.  Influenza vaccine. / Every year.  Tetanus, diphtheria, and acellular pertussis (Tdap/Td) vaccine.** / 1 dose of Td every 10 years.  Zoster vaccine.** / 1 dose for adults aged 86 years or older.  Pneumococcal 13-valent conjugate (PCV13) vaccine.** / Consult your health care provider.  Pneumococcal polysaccharide (PPSV23) vaccine.** / 1 dose for all adults aged 35 years and older. Screening for abdominal aortic aneurysm (AAA)  by ultrasound is recommended for people who have history of high blood pressure or who are current or former smokers.

## 2014-12-22 LAB — INSULIN, FASTING: Insulin fasting, serum: 15.1 u[IU]/mL (ref 2.0–19.6)

## 2014-12-22 LAB — FERRITIN: Ferritin: 63 ng/mL (ref 10–291)

## 2014-12-22 LAB — URINALYSIS, ROUTINE W REFLEX MICROSCOPIC
BILIRUBIN URINE: NEGATIVE
GLUCOSE, UA: NEGATIVE mg/dL
Hgb urine dipstick: NEGATIVE
Ketones, ur: NEGATIVE mg/dL
Leukocytes, UA: NEGATIVE
Nitrite: NEGATIVE
Protein, ur: NEGATIVE mg/dL
Specific Gravity, Urine: 1.015 (ref 1.005–1.030)
UROBILINOGEN UA: 1 mg/dL (ref 0.0–1.0)
pH: 6 (ref 5.0–8.0)

## 2014-12-22 LAB — BASIC METABOLIC PANEL WITH GFR
BUN: 12 mg/dL (ref 6–23)
CALCIUM: 9.3 mg/dL (ref 8.4–10.5)
CO2: 23 mEq/L (ref 19–32)
CREATININE: 0.73 mg/dL (ref 0.50–1.10)
Chloride: 110 mEq/L (ref 96–112)
GFR, Est African American: >89 mL/min
GFR, Est Non African American: 83 mL/min
Glucose, Bld: 98 mg/dL (ref 70–99)
Potassium: 3.9 mEq/L (ref 3.5–5.3)
SODIUM: 142 meq/L (ref 135–145)

## 2014-12-22 LAB — MICROALBUMIN / CREATININE URINE RATIO
CREATININE, URINE: 158.1 mg/dL
Microalb Creat Ratio: 17.7 mg/g (ref 0.0–30.0)
Microalb, Ur: 2.8 mg/dL — ABNORMAL HIGH (ref <2.0)

## 2014-12-22 LAB — HEPATIC FUNCTION PANEL
ALT: 49 U/L — ABNORMAL HIGH (ref 0–35)
AST: 109 U/L — AB (ref 0–37)
Albumin: 3.2 g/dL — ABNORMAL LOW (ref 3.5–5.2)
Alkaline Phosphatase: 105 U/L (ref 39–117)
BILIRUBIN TOTAL: 1.6 mg/dL — AB (ref 0.2–1.2)
Bilirubin, Direct: 0.4 mg/dL — ABNORMAL HIGH (ref 0.0–0.3)
Indirect Bilirubin: 1.2 mg/dL (ref 0.2–1.2)
Total Protein: 6.5 g/dL (ref 6.0–8.3)

## 2014-12-22 LAB — LIPID PANEL
CHOL/HDL RATIO: 2.2 ratio
Cholesterol: 171 mg/dL (ref 0–200)
HDL: 79 mg/dL (ref >=46)
LDL Cholesterol: 80 mg/dL (ref 0–99)
TRIGLYCERIDES: 62 mg/dL (ref <150)
VLDL: 12 mg/dL (ref 0–40)

## 2014-12-22 LAB — VITAMIN D 25 HYDROXY (VIT D DEFICIENCY, FRACTURES): VIT D 25 HYDROXY: 24 ng/mL — AB (ref 30–100)

## 2014-12-22 LAB — MAGNESIUM: Magnesium: 1.9 mg/dL (ref 1.5–2.5)

## 2014-12-22 LAB — IRON AND TIBC
%SAT: 30 % (ref 20–55)
IRON: 111 ug/dL (ref 42–145)
TIBC: 367 ug/dL (ref 250–470)
UIBC: 256 ug/dL (ref 125–400)

## 2014-12-22 LAB — TSH: TSH: 3.331 u[IU]/mL (ref 0.350–4.500)

## 2014-12-22 LAB — VITAMIN B12: Vitamin B-12: 1197 pg/mL — ABNORMAL HIGH (ref 211–911)

## 2015-01-08 ENCOUNTER — Encounter: Payer: Self-pay | Admitting: Physician Assistant

## 2015-01-08 DIAGNOSIS — G4733 Obstructive sleep apnea (adult) (pediatric): Secondary | ICD-10-CM | POA: Insufficient documentation

## 2015-02-22 ENCOUNTER — Telehealth: Payer: Self-pay | Admitting: Internal Medicine

## 2015-02-22 ENCOUNTER — Encounter: Payer: Self-pay | Admitting: Internal Medicine

## 2015-02-22 NOTE — Telephone Encounter (Signed)
Message  Received: Brittney Valentine; Vicie Mutters, PA-C            Brittney Valentine and Brittney Valentine,   The above patient was non compliant on her cpap and has turned her cpap back in to [place]. Since her AHI was low, maybe she would be more compliant on nasal cannula oxygen at night? The respiratory therapist suggested maybe doing an overnight oximetry on room air to see if shows a need for nocturnal oxygen? If this is okay with you, you can put order in epic and we can pull the order once it is entered. If you have any questions, please give me a call, thanks!   Mandy at [facility]

## 2015-03-05 NOTE — Telephone Encounter (Signed)
Brittney Valentine ordered an Oximetery test on patient with [facility].

## 2015-04-06 ENCOUNTER — Encounter: Payer: Self-pay | Admitting: Internal Medicine

## 2015-05-08 ENCOUNTER — Encounter: Payer: Self-pay | Admitting: Internal Medicine

## 2015-06-04 ENCOUNTER — Encounter: Payer: Self-pay | Admitting: *Deleted

## 2015-06-30 ENCOUNTER — Encounter: Payer: Self-pay | Admitting: *Deleted

## 2015-07-09 DIAGNOSIS — I10 Essential (primary) hypertension: Secondary | ICD-10-CM | POA: Diagnosis not present

## 2015-07-13 ENCOUNTER — Ambulatory Visit (INDEPENDENT_AMBULATORY_CARE_PROVIDER_SITE_OTHER): Payer: PPO | Admitting: Internal Medicine

## 2015-07-13 VITALS — BP 138/80 | HR 79 | Temp 98.5°F | Resp 16 | Ht 65.0 in | Wt 239.0 lb

## 2015-07-13 DIAGNOSIS — H66001 Acute suppurative otitis media without spontaneous rupture of ear drum, right ear: Secondary | ICD-10-CM

## 2015-07-13 MED ORDER — HYDROCODONE-HOMATROPINE 5-1.5 MG/5ML PO SYRP: 5.0000 mL | ORAL_SOLUTION | Freq: Four times a day (QID) | ORAL | Status: DC | PRN

## 2015-07-13 MED ORDER — AMOXICILLIN 500 MG PO CAPS: 1000.0000 mg | ORAL_CAPSULE | Freq: Two times a day (BID) | ORAL | Status: DC

## 2015-07-13 NOTE — Progress Notes (Signed)
Subjective:  By signing my name below, I, Essence Howell, attest that this documentation has been prepared under the direction and in the presence of Leandrew Koyanagi, MD Electronically Signed: Ladene Artist, ED Scribe 07/13/2015 at 6:45 PM.   Patient ID: Brittney Valentine, female    DOB: 20-Dec-1940, 75 y.o.   MRN: [id number]  Chief Complaint  Patient presents with  . Cough    x 1 day   . Nasal Congestion  . Sore Throat  . Ear Fullness    rt ear more than the left , rt. ear throbbing   . Wheezing   HPI HPI Comments: Brittney Valentine is a 75 y.o. female who presents to the [facility] complaining of gradually worsening sore throat onset yesterday. Pt reports associated postnasal drip, nasal congestion, right ear pain, cough and wheezing. No treatments tried PTA. She denies fever.  Past Medical History  Diagnosis Date  . Shingles   . Diverticulosis   . Hemorrhoids   . Portal hypertensive gastropathy   . Varices, esophageal ([place])   . Obesity   . Cirrhosis ([place])   . Zoster   . Partial small bowel obstruction ([place])   . Anemia   . Blood transfusion without reported diagnosis   . Heart murmur   . Thyroid disease   . Personal history of colonic polyps 03/25/2012    8 mm rectal adenoma 03/2012  . Hypertension   . Kidney stones   . Paroxysmal atrial fibrillation (HCC)   . Degenerative disk disease    Current Outpatient Prescriptions on File Prior to Visit  Medication Sig Dispense Refill  . Cholecalciferol (VITAMIN D-3) 1000 UNITS CAPS Take 1,000 Units by mouth 2 (two) times daily.     Marland Kitchen docusate sodium (COLACE) 100 MG capsule Take 1 capsule (100 mg total) by mouth every 12 (twelve) hours. 60 capsule 0  . flecainide (TAMBOCOR) 100 MG tablet Take 1 tablet (100 mg total) by mouth 2 (two) times daily. 180 tablet 4  . levothyroxine (SYNTHROID, LEVOTHROID) 112 MCG tablet Take 1 tablet (112 mcg total) by mouth every morning. 90 tablet 4   No current  facility-administered medications on file prior to visit.   Allergies  Allergen Reactions  . Ciprofloxacin     Swelling  . Robaxin [Methocarbamol]     palpiations  . Sulfonamide Derivatives     See little dots, skin feels like its burning  . Verapamil     palpitations   Review of Systems  Constitutional: Negative for fever.  HENT: Positive for congestion, ear pain, postnasal drip and sore throat.   Respiratory: Positive for cough and wheezing.       Objective:   Physical Exam  Constitutional: She is oriented to person, place, and time. She appears well-developed and well-nourished. No distress.  HENT:  Head: Normocephalic and atraumatic.  Right Ear: Tympanic membrane is erythematous.  Nose: Rhinorrhea (clear) present.  Mouth/Throat: Oropharynx is clear and moist.  R ear is red with pus-filled bullae on TM.   Eyes: Conjunctivae and EOM are normal.  Neck: Neck supple. No tracheal deviation present.  Cardiovascular: Normal rate.   Pulmonary/Chest: Effort normal. No respiratory distress.  Musculoskeletal: Normal range of motion.  Neurological: She is alert and oriented to person, place, and time.  Skin: Skin is warm and dry.  Psychiatric: She has a normal mood and affect. Her behavior is normal.  Nursing note and vitals reviewed.     Assessment & Plan:  I have completed the patient encounter in its entirety as documented by the scribe, with editing by me where necessary. Shondell Poulson P. Laney Pastor, M.D. Acute suppurative otitis media of right ear without spontaneous rupture of tympanic membrane, recurrence not specified  Meds ordered this encounter  Medications  . HYDROcodone-homatropine (HYCODAN) 5-1.5 MG/5ML syrup    Sig: Take 5 mLs by mouth every 6 (six) hours as needed.    Dispense:  120 mL    Refill:  0  . amoxicillin (AMOXIL) 500 MG capsule    Sig: Take 2 capsules (1,000 mg total) by mouth 2 (two) times daily.    Dispense:  40 capsule    Refill:  0

## 2015-07-16 ENCOUNTER — Ambulatory Visit (INDEPENDENT_AMBULATORY_CARE_PROVIDER_SITE_OTHER): Payer: PPO | Admitting: Internal Medicine

## 2015-07-16 ENCOUNTER — Encounter: Payer: Self-pay | Admitting: Internal Medicine

## 2015-07-16 VITALS — BP 158/80 | HR 80 | Temp 99.8°F | Resp 20 | Ht 65.0 in | Wt 239.0 lb

## 2015-07-16 DIAGNOSIS — J069 Acute upper respiratory infection, unspecified: Secondary | ICD-10-CM | POA: Diagnosis not present

## 2015-07-16 MED ORDER — MOMETASONE FUROATE 50 MCG/ACT NA SUSP: 2.0000 | Freq: Every day | NASAL | Status: DC

## 2015-07-16 MED ORDER — DOXYCYCLINE HYCLATE 100 MG PO CAPS: 100.0000 mg | ORAL_CAPSULE | Freq: Two times a day (BID) | ORAL | Status: DC

## 2015-07-16 MED ORDER — PREDNISONE 20 MG PO TABS: ORAL_TABLET | ORAL | Status: DC

## 2015-07-16 MED ORDER — PROMETHAZINE HCL 6.25 MG/5ML PO SYRP: 12.5000 mg | ORAL_SOLUTION | Freq: Four times a day (QID) | ORAL | Status: DC | PRN

## 2015-07-16 MED ORDER — ERYTHROMYCIN 5 MG/GM OP OINT: TOPICAL_OINTMENT | Freq: Three times a day (TID) | OPHTHALMIC | Status: AC

## 2015-07-16 NOTE — Progress Notes (Signed)
Patient ID: Brittney Valentine, female   DOB: Oct 10, 1940, 75 y.o.   MRN: [id number]  HPI  Patient presents to the office for evaluation of cough.  It has been going on for 4 days.  Patient reports night > day, dry, barky, worse with lying down.  They also endorse change in voice, chills, postnasal drip, wheezing and palpitations, sore throat, nasal congestion, ear fullness.  .  They have tried antitussives or 1 dose of amoxicillin.  She reports that she has been having palpitations which have been coming and going and has been really bothering her and causing her blood pressure to go up.  They report that nothing has worked.  They admits to other sick contacts.  She reports that she thinks that palpitations that are coming from the amoxicillin.    She hasn't seen Dr. Irish Lack lately.  She feels that she is intolerant of taking blood pressure medications.    Review of Systems  Constitutional: Positive for malaise/fatigue. Negative for fever and chills.  HENT: Positive for congestion, ear pain and sore throat.   Respiratory: Positive for cough, shortness of breath and wheezing.   Cardiovascular: Positive for palpitations. Negative for chest pain and leg swelling.  Skin: Negative.   Neurological: Positive for headaches.    PE:  Filed Vitals:   07/16/15 1112  BP: 158/80  Pulse: 80  Temp: 99.8 F (37.7 C)  Resp: 20   General:  Alert and non-toxic, WDWN, NAD HEENT: NCAT, PERLA, EOM normal, no occular discharge or erythema.  Nasal mucosal edema with sinus tenderness to palpation.  Oropharynx clear with minimal oropharyngeal edema and erythema.  Mucous membranes moist and pink. Neck:  Cervical adenopathy Chest:  RRR no MRGs.  Lungs clear to auscultation A&P with no wheezes rhonchi or rales.   Abdomen: +BS x 4 quadrants, soft, non-tender, no guarding, rigidity, or rebound. Skin: warm and dry no rash Neuro: A&Ox4, CN II-XII grossly intact  Assessment and Plan:   1. Acute URI -romycin  ointment for the eye - doxycycline (VIBRAMYCIN) 100 MG capsule; Take 1 capsule (100 mg total) by mouth 2 (two) times daily. One po bid x 7 days  Dispense: 14 capsule; Refill: 0 - mometasone (NASONEX) 50 MCG/ACT nasal spray; Place 2 sprays into the nose daily.  Dispense: 17 g; Refill: 2 - promethazine (PHENERGAN) 6.25 MG/5ML syrup; Take 10 mLs (12.5 mg total) by mouth every 6 (six) hours as needed (cold symptoms).  Dispense: 120 mL; Refill: 0 - predniSONE (DELTASONE) 20 MG tablet; 3 tabs po daily x 3 days, then 2 tabs x 3 days, then 1.5 tabs x 3 days, then 1 tab x 3 days, then 0.5 tabs x 3 days  Dispense: 27 tablet; Refill: 0

## 2015-07-16 NOTE — Patient Instructions (Signed)
Please take doxycycline twice daily until this is gone.  This is an antibiotic which will kill bacteria.   Please take prednisone until it is gone to help with your congestion and breathing.  Please take promethazine syrup to help with coughing.   Please use saline in your nose as often as you can tolerate.  Please use nasonex 2 sprays per nostril before bedtime.  Please take claritin or zyrtec daily to help with congestion.

## 2015-08-07 DIAGNOSIS — I10 Essential (primary) hypertension: Secondary | ICD-10-CM | POA: Diagnosis not present

## 2015-09-06 DIAGNOSIS — I10 Essential (primary) hypertension: Secondary | ICD-10-CM | POA: Diagnosis not present

## 2015-10-07 DIAGNOSIS — I10 Essential (primary) hypertension: Secondary | ICD-10-CM | POA: Diagnosis not present

## 2015-10-31 ENCOUNTER — Other Ambulatory Visit: Payer: Self-pay | Admitting: Internal Medicine

## 2015-11-06 DIAGNOSIS — I10 Essential (primary) hypertension: Secondary | ICD-10-CM | POA: Diagnosis not present

## 2015-11-15 DIAGNOSIS — Z1231 Encounter for screening mammogram for malignant neoplasm of breast: Secondary | ICD-10-CM | POA: Diagnosis not present

## 2015-12-07 DIAGNOSIS — I10 Essential (primary) hypertension: Secondary | ICD-10-CM | POA: Diagnosis not present

## 2015-12-21 ENCOUNTER — Encounter: Payer: Self-pay | Admitting: Physician Assistant

## 2015-12-25 DIAGNOSIS — M25561 Pain in right knee: Secondary | ICD-10-CM | POA: Diagnosis not present

## 2015-12-25 DIAGNOSIS — M25562 Pain in left knee: Secondary | ICD-10-CM | POA: Diagnosis not present

## 2015-12-25 DIAGNOSIS — M170 Bilateral primary osteoarthritis of knee: Secondary | ICD-10-CM | POA: Diagnosis not present

## 2015-12-26 ENCOUNTER — Ambulatory Visit (INDEPENDENT_AMBULATORY_CARE_PROVIDER_SITE_OTHER): Payer: PPO | Admitting: Internal Medicine

## 2015-12-26 ENCOUNTER — Encounter: Payer: Self-pay | Admitting: Internal Medicine

## 2015-12-26 VITALS — BP 142/80 | HR 62 | Temp 98.0°F | Resp 16 | Ht 64.75 in | Wt 240.0 lb

## 2015-12-26 DIAGNOSIS — K7469 Other cirrhosis of liver: Secondary | ICD-10-CM

## 2015-12-26 DIAGNOSIS — I480 Paroxysmal atrial fibrillation: Secondary | ICD-10-CM

## 2015-12-26 DIAGNOSIS — E039 Hypothyroidism, unspecified: Secondary | ICD-10-CM

## 2015-12-26 DIAGNOSIS — R7303 Prediabetes: Secondary | ICD-10-CM

## 2015-12-26 DIAGNOSIS — Z0000 Encounter for general adult medical examination without abnormal findings: Secondary | ICD-10-CM

## 2015-12-26 DIAGNOSIS — Z860100 Personal history of colon polyps, unspecified: Secondary | ICD-10-CM

## 2015-12-26 DIAGNOSIS — Z79899 Other long term (current) drug therapy: Secondary | ICD-10-CM

## 2015-12-26 DIAGNOSIS — Z23 Encounter for immunization: Secondary | ICD-10-CM

## 2015-12-26 DIAGNOSIS — I10 Essential (primary) hypertension: Secondary | ICD-10-CM | POA: Diagnosis not present

## 2015-12-26 DIAGNOSIS — M5432 Sciatica, left side: Secondary | ICD-10-CM

## 2015-12-26 DIAGNOSIS — E669 Obesity, unspecified: Secondary | ICD-10-CM | POA: Diagnosis not present

## 2015-12-26 DIAGNOSIS — K766 Portal hypertension: Secondary | ICD-10-CM

## 2015-12-26 DIAGNOSIS — Z8719 Personal history of other diseases of the digestive system: Secondary | ICD-10-CM

## 2015-12-26 DIAGNOSIS — G4733 Obstructive sleep apnea (adult) (pediatric): Secondary | ICD-10-CM

## 2015-12-26 DIAGNOSIS — I8500 Esophageal varices without bleeding: Secondary | ICD-10-CM

## 2015-12-26 DIAGNOSIS — E559 Vitamin D deficiency, unspecified: Secondary | ICD-10-CM | POA: Diagnosis not present

## 2015-12-26 LAB — HEPATIC FUNCTION PANEL
ALT: 34 U/L — ABNORMAL HIGH (ref 6–29)
AST: 78 U/L — ABNORMAL HIGH (ref 10–35)
Albumin: 3.1 g/dL — ABNORMAL LOW (ref 3.6–5.1)
Alkaline Phosphatase: 91 U/L (ref 33–130)
BILIRUBIN INDIRECT: 1.6 mg/dL — AB (ref 0.2–1.2)
Bilirubin, Direct: 0.5 mg/dL — ABNORMAL HIGH (ref <=0.2)
Total Bilirubin: 2.1 mg/dL — ABNORMAL HIGH (ref 0.2–1.2)
Total Protein: 6.8 g/dL (ref 6.1–8.1)

## 2015-12-26 LAB — CBC WITH DIFFERENTIAL/PLATELET
BASOS PCT: 1 %
Basophils Absolute: 40 cells/uL (ref 0–200)
EOS PCT: 3 %
Eosinophils Absolute: 120 cells/uL (ref 15–500)
HEMATOCRIT: 44.2 % (ref 35.0–45.0)
Hemoglobin: 14.6 g/dL (ref 11.7–15.5)
LYMPHS PCT: 27 %
Lymphs Abs: 1080 cells/uL (ref 850–3900)
MCH: 31.3 pg (ref 27.0–33.0)
MCHC: 33 g/dL (ref 32.0–36.0)
MCV: 94.6 fL (ref 80.0–100.0)
MONO ABS: 480 {cells}/uL (ref 200–950)
MPV: 10.5 fL (ref 7.5–12.5)
Monocytes Relative: 12 %
Neutro Abs: 2280 cells/uL (ref 1500–7800)
Neutrophils Relative %: 57 %
Platelets: 150 10*3/uL (ref 140–400)
RBC: 4.67 MIL/uL (ref 3.80–5.10)
RDW: 16.9 % — AB (ref 11.0–15.0)
WBC: 4 10*3/uL (ref 3.8–10.8)

## 2015-12-26 LAB — BASIC METABOLIC PANEL WITH GFR
BUN: 11 mg/dL (ref 7–25)
CO2: 24 mmol/L (ref 20–31)
Calcium: 9.2 mg/dL (ref 8.6–10.4)
Chloride: 109 mmol/L (ref 98–110)
Creat: 0.71 mg/dL (ref 0.60–0.93)
GFR, Est Non African American: 85 mL/min (ref >=60)
GLUCOSE: 98 mg/dL (ref 65–99)
POTASSIUM: 4 mmol/L (ref 3.5–5.3)
Sodium: 142 mmol/L (ref 135–146)

## 2015-12-26 LAB — HEMOGLOBIN A1C
Hgb A1c MFr Bld: 5.6 % (ref <5.7)
Mean Plasma Glucose: 114 mg/dL

## 2015-12-26 LAB — LIPID PANEL
Cholesterol: 174 mg/dL (ref 125–200)
HDL: 80 mg/dL (ref >=46)
LDL Cholesterol: 80 mg/dL (ref <130)
TRIGLYCERIDES: 68 mg/dL (ref <150)
Total CHOL/HDL Ratio: 2.2 Ratio (ref <=5.0)
VLDL: 14 mg/dL (ref <30)

## 2015-12-26 LAB — TSH: TSH: 5.95 mIU/L — ABNORMAL HIGH

## 2015-12-26 LAB — MAGNESIUM: Magnesium: 1.9 mg/dL (ref 1.5–2.5)

## 2015-12-26 MED ORDER — FLECAINIDE ACETATE 100 MG PO TABS: 100.0000 mg | ORAL_TABLET | Freq: Two times a day (BID) | ORAL | Status: DC

## 2015-12-26 NOTE — Progress Notes (Signed)
Complete Physical  Assessment and Plan:   1. PORTAL HYPERTENSION -refused beta blockers due to side effects -secondary to NASH  2. Esophageal varices without bleeding, unspecified esophageal varices type ([place]) -followed by Dr. Carlean Purl -due for endoscopy in 2018  3. Paroxysmal atrial fibrillation (HCC) -not on anticoags secondary to esophageal varicies  4. Essential hypertension -cont to monitor - Urinalysis, Routine w reflex microscopic (not at [facility]) - Microalbumin / creatinine urine ratio - EKG 12-Lead - TSH  5. Obstructive sleep apnea   6. Other cirrhosis of liver (HCC) -NASH -weight loss -followed by Dr. Carlean Purl  7. Hypothyroidism, unspecified hypothyroidism type -cont levothyroxine - TSH  8. Left sided sciatica   9. Obesity  - Lipid panel  10. History of small bowel obstruction -followed by GI  11. History of colonic polyps -due for colonoscopy in 2018 - POC Hemoccult Bld/Stl (3-Cd Home Screen); Future  12. Medication management   13. Vitamin D deficiency -cont supplement - VITAMIN D 25 Hydroxy (Vit-D Deficiency, Fractures)  14. Prediabetes  - Hemoglobin A1c - Insulin, random  15. Routine general medical examination at a health care facility  - CBC with Differential/Platelet - BASIC METABOLIC PANEL WITH GFR - Hepatic function panel - Magnesium    Discussed med's effects and SE's. Screening labs and tests as requested with regular follow-up as recommended.  HPI  75 y.o. female  presents for a complete physical.  Her blood pressure has been controlled at home, today their BP is BP: (!) 142/80 mmHg.  She does workout. She denies chest pain, shortness of breath, dizziness.  She has not followed with Dr. Irish Lack in a while.  She reports that she has not seen her cardiologist since Jan 2016.  She is still taking tambicor.  She has been tried on multiple blood pressure medications and she refused them.   She is not on cholesterol medication  and denies myalgias. Her cholesterol is at goal. The cholesterol last visit was:  Lab Results  Component Value Date   CHOL 171 12/21/2014   HDL 79 12/21/2014   LDLCALC 80 12/21/2014   TRIG 62 12/21/2014   CHOLHDL 2.2 12/21/2014  .  She has been working on diet and exercise for prediabetes, she is on bASA, she is on ACE/ARB and denies foot ulcerations, hyperglycemia, hypoglycemia , increased appetite, nausea, paresthesia of the feet, polydipsia, polyuria, visual disturbances, vomiting and weight loss. Last A1C in the office was:  Lab Results  Component Value Date   HGBA1C 5.6 12/21/2014    Patient is on Vitamin D supplement.   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  She is due to have a hylaronic gel shot in her knee starting next week.    She is seeing Dr. Gershon Crane for her eye exam.  She is due to see him.  She does not currently have an appointment.    Current Medications:  Current Outpatient Prescriptions on File Prior to Visit  Medication Sig Dispense Refill  . Cholecalciferol (VITAMIN D-3) 1000 UNITS CAPS Take 1,000 Units by mouth 2 (two) times daily.     . flecainide (TAMBOCOR) 100 MG tablet Take 1 tablet (100 mg total) by mouth 2 (two) times daily. 180 tablet 4  . levothyroxine (SYNTHROID, LEVOTHROID) 112 MCG tablet TAKE ONE TABLET BY MOUTH ONCE DAILY IN THE MORNING 90 tablet 0   No current facility-administered medications on file prior  to visit.    Health Maintenance:   Immunization History  Administered Date(s) Administered  . DT 12/21/2014  . Influenza Whole 03/09/2012  . Influenza,inj,Quad PF,36+ Mos 02/09/2014  . Pneumococcal Conjugate-13 12/19/2013  . Pneumococcal Polysaccharide-23 06/09/2004  . Td 06/09/2004  . Zoster 12/15/2012    Tetanus: 2016 Pneumovax: 2015 Flu vaccine: 2015 Zostavax: 2014 Pap: Hysterectomy MGM: 11/14/15  DEXA: 2015 Colonoscopy: 2013, Due 2018 EGD:  2013   Patient Care Team: Unk Pinto, MD as PCP - General (Internal Medicine) Rutherford Guys, MD as Consulting Physician (Ophthalmology) Gatha Mayer, MD as Consulting Physician (Gastroenterology) Mauricia Area, MD as Consulting Physician (Nephrology) Jettie Booze, MD as Consulting Physician (Cardiology)  Allergies:  Allergies  Allergen Reactions  . Ciprofloxacin     Swelling  . Robaxin [Methocarbamol]     palpiations  . Sulfonamide Derivatives     See little dots, skin feels like its burning  . Verapamil     palpitations    Medical History:  Past Medical History  Diagnosis Date  . Shingles   . Diverticulosis   . Hemorrhoids   . Portal hypertensive gastropathy   . Varices, esophageal ([place])   . Obesity   . Cirrhosis ([place])   . Zoster   . Partial small bowel obstruction ([place])   . Anemia   . Blood transfusion without reported diagnosis   . Heart murmur   . Thyroid disease   . Personal history of colonic polyps 03/25/2012    8 mm rectal adenoma 03/2012  . Hypertension   . Kidney stones   . Paroxysmal atrial fibrillation (HCC)   . Degenerative disk disease     Surgical History:  Past Surgical History  Procedure Laterality Date  . Esophagogastroduodenoscopy  multiple  . Colonoscopy    . Abdominal hysterectomy    . Cholecystectomy  2000  . Appendectomy  1991  . Colon surgery    . Colon surgery      Family History:  Family History  Problem Relation Age of Onset  . Heart failure Mother     died from  . Hypertension Mother   . Heart attack Father     died from  . Hypertension Sister     Social History:  Social History  Substance Use Topics  . Smoking status: Never Smoker   . Smokeless tobacco: Never Used  . Alcohol Use: No    Review of Systems: Review of Systems  Constitutional: Negative for fever, chills and malaise/fatigue.  HENT: Negative for congestion, ear pain and sore throat.   Eyes: Negative.   Respiratory: Negative for  cough, shortness of breath and wheezing.   Cardiovascular: Positive for palpitations. Negative for chest pain and leg swelling.  Gastrointestinal: Positive for constipation. Negative for heartburn, abdominal pain, diarrhea, blood in stool and melena.  Genitourinary: Negative.   Skin: Negative.   Neurological: Negative for dizziness, sensory change, loss of consciousness and headaches.  Psychiatric/Behavioral: Negative for depression. The patient is not nervous/anxious and does not have insomnia.     Physical Exam: Estimated body mass index is 40.23 kg/(m^2) as calculated from the following:   Height as of this encounter: 5' 4.75" (1.645 m).   Weight as of this encounter: 240 lb (108.863 kg). BP 142/80 mmHg  Pulse 62  Temp(Src) 98 F (36.7 C) (Temporal)  Resp 16  Ht 5' 4.75" (1.645 m)  Wt 240 lb (108.863 kg)  BMI 40.23 kg/m2  General Appearance: Well nourished well developed, in no apparent distress.  Eyes: PERRLA, EOMs, conjunctiva no swelling or erythema ENT/Mouth: Ear canals normal without obstruction, swelling, erythema, or discharge.  TMs normal bilaterally with no erythema, bulging, retraction, or loss of landmark.  Oropharynx moist and clear with no exudate, erythema, or swelling.   Neck: Supple, thyroid normal. No bruits.  No cervical adenopathy Respiratory: Respiratory effort normal, Breath sounds clear A&P without wheeze, rhonchi, rales.   Cardio: RRR without murmurs, rubs or gallops. Brisk peripheral pulses without edema.  Chest: symmetric, with normal excursions Breasts: Symmetric, without lumps, nipple discharge, retractions.  Abdomen: Soft, nontender, no guarding, rebound, hernias, masses, or organomegaly.  Lymphatics: Non tender without lymphadenopathy.  Genitourinary:  Musculoskeletal: Full ROM all peripheral extremities,5/5 strength, and normal gait.  Skin: Warm, dry without rashes, lesions, ecchymosis. Neuro: Awake and oriented X 3, Cranial nerves intact, reflexes  equal bilaterally. Normal muscle tone, no cerebellar symptoms. Sensation intact.  Psych:  normal affect, Insight and Judgment appropriate.   EKG: WNL no changes.  Over 40 minutes of exam, counseling, chart review and critical decision making was performed  Loma Sousa Forcucci 9:30 AM [facility]

## 2015-12-26 NOTE — Patient Instructions (Signed)
Please call the cardiology office and try to get set up with either Dr. Warren Lacy or Dr. Burt Knack.

## 2015-12-26 NOTE — Addendum Note (Signed)
Addended by: Khadeejah Castner A on: 12/26/2015 10:03 AM   Modules accepted: Orders, SmartSet

## 2015-12-27 LAB — URINALYSIS, MICROSCOPIC ONLY
BACTERIA UA: NONE SEEN [HPF]
Casts: NONE SEEN [LPF]
Yeast: NONE SEEN [HPF]

## 2015-12-27 LAB — URINALYSIS, ROUTINE W REFLEX MICROSCOPIC
Bilirubin Urine: NEGATIVE
Glucose, UA: NEGATIVE
KETONES UR: NEGATIVE
LEUKOCYTES UA: NEGATIVE
NITRITE: NEGATIVE
PH: 6.5 (ref 5.0–8.0)
Specific Gravity, Urine: 1.019 (ref 1.001–1.035)

## 2015-12-27 LAB — MICROALBUMIN / CREATININE URINE RATIO
Creatinine, Urine: 223 mg/dL (ref 20–320)
MICROALB UR: 5.5 mg/dL
MICROALB/CREAT RATIO: 25 ug/mg{creat} (ref <30)

## 2015-12-27 LAB — INSULIN, RANDOM: INSULIN: 23 u[IU]/mL — AB (ref 2.0–19.6)

## 2015-12-27 LAB — VITAMIN D 25 HYDROXY (VIT D DEFICIENCY, FRACTURES): Vit D, 25-Hydroxy: 24 ng/mL — ABNORMAL LOW (ref 30–100)

## 2016-01-02 DIAGNOSIS — R2689 Other abnormalities of gait and mobility: Secondary | ICD-10-CM | POA: Diagnosis not present

## 2016-01-02 DIAGNOSIS — M25561 Pain in right knee: Secondary | ICD-10-CM | POA: Diagnosis not present

## 2016-01-02 DIAGNOSIS — M170 Bilateral primary osteoarthritis of knee: Secondary | ICD-10-CM | POA: Diagnosis not present

## 2016-01-02 DIAGNOSIS — R262 Difficulty in walking, not elsewhere classified: Secondary | ICD-10-CM | POA: Diagnosis not present

## 2016-01-02 DIAGNOSIS — M25562 Pain in left knee: Secondary | ICD-10-CM | POA: Diagnosis not present

## 2016-01-06 DIAGNOSIS — I10 Essential (primary) hypertension: Secondary | ICD-10-CM | POA: Diagnosis not present

## 2016-01-09 DIAGNOSIS — M1711 Unilateral primary osteoarthritis, right knee: Secondary | ICD-10-CM | POA: Diagnosis not present

## 2016-01-09 DIAGNOSIS — M25561 Pain in right knee: Secondary | ICD-10-CM | POA: Diagnosis not present

## 2016-01-16 DIAGNOSIS — M1712 Unilateral primary osteoarthritis, left knee: Secondary | ICD-10-CM | POA: Diagnosis not present

## 2016-01-16 DIAGNOSIS — M25562 Pain in left knee: Secondary | ICD-10-CM | POA: Diagnosis not present

## 2016-01-21 ENCOUNTER — Encounter: Payer: Self-pay | Admitting: Cardiology

## 2016-01-23 DIAGNOSIS — M1711 Unilateral primary osteoarthritis, right knee: Secondary | ICD-10-CM | POA: Diagnosis not present

## 2016-01-23 DIAGNOSIS — M25561 Pain in right knee: Secondary | ICD-10-CM | POA: Diagnosis not present

## 2016-01-24 ENCOUNTER — Telehealth: Payer: Self-pay | Admitting: Interventional Cardiology

## 2016-01-24 NOTE — Telephone Encounter (Signed)
OK with me.

## 2016-01-24 NOTE — Telephone Encounter (Signed)
Mrs. Komm is wanting to change from Dr. Irish Lack to Dr. Percival Spanish , will that be okay with you .   Thanks

## 2016-01-25 ENCOUNTER — Encounter: Payer: Self-pay | Admitting: *Deleted

## 2016-01-25 NOTE — Telephone Encounter (Signed)
OK 

## 2016-01-30 DIAGNOSIS — M25562 Pain in left knee: Secondary | ICD-10-CM | POA: Diagnosis not present

## 2016-01-30 DIAGNOSIS — M1712 Unilateral primary osteoarthritis, left knee: Secondary | ICD-10-CM | POA: Diagnosis not present

## 2016-02-01 ENCOUNTER — Ambulatory Visit: Payer: PPO | Admitting: Cardiology

## 2016-02-04 ENCOUNTER — Ambulatory Visit: Payer: PPO | Admitting: Cardiology

## 2016-02-04 DIAGNOSIS — H5203 Hypermetropia, bilateral: Secondary | ICD-10-CM | POA: Diagnosis not present

## 2016-02-04 DIAGNOSIS — H25013 Cortical age-related cataract, bilateral: Secondary | ICD-10-CM | POA: Diagnosis not present

## 2016-02-04 DIAGNOSIS — H524 Presbyopia: Secondary | ICD-10-CM | POA: Diagnosis not present

## 2016-02-04 DIAGNOSIS — H2513 Age-related nuclear cataract, bilateral: Secondary | ICD-10-CM | POA: Diagnosis not present

## 2016-02-06 DIAGNOSIS — I10 Essential (primary) hypertension: Secondary | ICD-10-CM | POA: Diagnosis not present

## 2016-02-06 DIAGNOSIS — M25561 Pain in right knee: Secondary | ICD-10-CM | POA: Diagnosis not present

## 2016-02-06 DIAGNOSIS — M1711 Unilateral primary osteoarthritis, right knee: Secondary | ICD-10-CM | POA: Diagnosis not present

## 2016-02-08 ENCOUNTER — Other Ambulatory Visit: Payer: Self-pay | Admitting: Internal Medicine

## 2016-02-09 ENCOUNTER — Other Ambulatory Visit: Payer: Self-pay | Admitting: Internal Medicine

## 2016-02-13 DIAGNOSIS — M1712 Unilateral primary osteoarthritis, left knee: Secondary | ICD-10-CM | POA: Diagnosis not present

## 2016-02-13 DIAGNOSIS — M25562 Pain in left knee: Secondary | ICD-10-CM | POA: Diagnosis not present

## 2016-02-20 DIAGNOSIS — M170 Bilateral primary osteoarthritis of knee: Secondary | ICD-10-CM | POA: Diagnosis not present

## 2016-02-20 DIAGNOSIS — M25562 Pain in left knee: Secondary | ICD-10-CM | POA: Diagnosis not present

## 2016-02-20 DIAGNOSIS — M25561 Pain in right knee: Secondary | ICD-10-CM | POA: Diagnosis not present

## 2016-03-08 DIAGNOSIS — I10 Essential (primary) hypertension: Secondary | ICD-10-CM | POA: Diagnosis not present

## 2016-04-07 ENCOUNTER — Ambulatory Visit: Payer: Self-pay | Admitting: Internal Medicine

## 2016-04-07 DIAGNOSIS — I10 Essential (primary) hypertension: Secondary | ICD-10-CM | POA: Diagnosis not present

## 2016-05-04 ENCOUNTER — Encounter: Payer: Self-pay | Admitting: Cardiology

## 2016-05-04 NOTE — Progress Notes (Signed)
Cardiology Office Note   Date:  05/05/2016   ID:  NGELA DONZE, DOB 15-Aug-1940, MRN [id number]  PCP:  Alesia Richards, MD  Cardiologist:   Minus Breeding, MD  Referring:  Alesia Richards, MD  Chief Complaint  Patient presents with  . Follow-up      History of Present Illness: Brittney Valentine is a 75 y.o. female who presents for evaluation of atrial fib that was diagnosed in 2000.  This is her first appt with me.  She has been treated with flecainide.  She has been intolerant of metoprolol and Cardizem.    She reports that she rarely has palpitations. She has none of the sustained symptoms that she had previously.  She mostly complains of fatigue. She says this has been going on since she had problems with her gallbladder and long hospitalization in 2000. However, she also sleeps during the day and stays awake at night. She tries to get stuff done during the patient's she's fatigued. She does not have shortness of breath, PND or orthopnea. She does not have palpitations, presyncope or syncope. She has no chest pressure, neck or arm discomfort.   Past Medical History:  Diagnosis Date  . Anemia   . Blood transfusion without reported diagnosis   . Cirrhosis ([place])   . Degenerative disk disease   . Diverticulosis   . Hemorrhoids   . Hypertension   . Kidney stones   . Obesity   . Paroxysmal atrial fibrillation (HCC)   . Partial small bowel obstruction   . Personal history of colonic polyps 03/25/2012   8 mm rectal adenoma 03/2012  . Portal hypertensive gastropathy   . Shingles   . Thyroid disease   . Varices, esophageal ([place])   . Zoster     Past Surgical History:  Procedure Laterality Date  . ABDOMINAL HYSTERECTOMY    . APPENDECTOMY  1991  . CHOLECYSTECTOMY  2000  . COLON SURGERY    . COLON SURGERY    . COLONOSCOPY    . ESOPHAGOGASTRODUODENOSCOPY  multiple     Current Outpatient Prescriptions  Medication Sig Dispense Refill  . Cholecalciferol  (VITAMIN D-3) 1000 UNITS CAPS Take 1,000 Units by mouth 2 (two) times daily.     . flecainide (TAMBOCOR) 100 MG tablet Take 1 tablet (100 mg total) by mouth 2 (two) times daily. 180 tablet 4  . levothyroxine (SYNTHROID, LEVOTHROID) 112 MCG tablet TAKE ONE TABLET BY MOUTH IN THE MORNING 90 tablet 0   No current facility-administered medications for this visit.     Allergies:   Ciprofloxacin; Robaxin [methocarbamol]; Sulfonamide derivatives; and Verapamil    ROS:  Please see the history of present illness.   Otherwise, review of systems are positive for none.   All other systems are reviewed and negative.    PHYSICAL EXAM: VS:  BP 138/74   Pulse 68   Ht 5\' 4"  (1.626 m)   Wt 232 lb 9.6 oz (105.5 kg)   BMI 39.93 kg/m  , BMI Body mass index is 39.93 kg/m. GENERAL:  Well appearing HEENT:  Pupils equal round and reactive, fundi not visualized, oral mucosa unremarkable NECK:  No jugular venous distention, waveform within normal limits, carotid upstroke brisk and symmetric, Left sided bruit, no thyromegaly LYMPHATICS:  No cervical, inguinal adenopathy LUNGS:  Clear to auscultation bilaterally BACK:  No CVA tenderness CHEST:  Unremarkable HEART:  PMI not displaced or sustained,S1 and S2 within normal limits, no S3, no S4, no clicks, no  rubs, 2 out of 6 apical systolic murmur radiating slightly out the aortic outflow tract and early peaking, no diastolic murmurs ABD:  Flat, positive bowel sounds normal in frequency in pitch, no bruits, no rebound, no guarding, no midline pulsatile mass, no hepatomegaly, no splenomegaly EXT:  2 plus pulses throughout, mild leg edema, no cyanosis no clubbing SKIN:  No rashes no nodules NEURO:  Cranial nerves II through XII grossly intact, motor grossly intact throughout PSYCH:  Cognitively intact, oriented to person place and time   EKG:  EKG is not ordered today. The ekg ordered 12/09/15 sinus rhythm, rate 96, axis within normal limits, intervals within normal  limits, no acute ST-T wave changes.    Recent Labs: 12/26/2015: ALT 34; BUN 11; Creat 0.71; Hemoglobin 14.6; Magnesium 1.9; Platelets 150; Potassium 4.0; Sodium 142; TSH 5.95    Lipid Panel    Component Value Date/Time   CHOL 174 12/26/2015 0950   TRIG 68 12/26/2015 0950   HDL 80 12/26/2015 0950   CHOLHDL 2.2 12/26/2015 0950   VLDL 14 12/26/2015 0950   LDLCALC 80 12/26/2015 0950      Wt Readings from Last 3 Encounters:  05/05/16 232 lb 9.6 oz (105.5 kg)  12/26/15 240 lb (108.9 kg)  07/16/15 239 lb (108.4 kg)      Other studies Reviewed: Additional studies/ records that were reviewed today include: None. Review of the above records demonstrates:  Please see elsewhere in the note.     ASSESSMENT AND PLAN:  PAROXYSMAL ATRIAL FIB:  Ms. SAFIYYAH BUEHRLE has a CHA2DS2 - VASc score of 3 with a risk of stroke of 3.2%.   HTN:   The blood pressure is at target. No change in medications is indicated. We will continue with therapeutic lifestyle changes (TLC).  FATIGUE: We discussed this at length. She's going to start by sleeping through the night and staying awake during the day.  She's going to start an exercise regimen. If her tiredness is not improved we will further investigate.    BRUIT:  I will order carotid Dopplers.   Current medicines are reviewed at length with the patient today.  The patient does not have concerns regarding medicines.  The following changes have been made:  no change  Labs/ tests ordered today include: Doppler No orders of the defined types were placed in this encounter.    Disposition:   FU with six months.     Signed, Minus Breeding, MD  05/05/2016 10:34 AM    [facility]

## 2016-05-05 ENCOUNTER — Encounter: Payer: Self-pay | Admitting: Cardiology

## 2016-05-05 ENCOUNTER — Ambulatory Visit (INDEPENDENT_AMBULATORY_CARE_PROVIDER_SITE_OTHER): Payer: PPO | Admitting: Cardiology

## 2016-05-05 VITALS — BP 138/74 | HR 68 | Ht 64.0 in | Wt 232.6 lb

## 2016-05-05 DIAGNOSIS — R0989 Other specified symptoms and signs involving the circulatory and respiratory systems: Secondary | ICD-10-CM | POA: Diagnosis not present

## 2016-05-05 DIAGNOSIS — I480 Paroxysmal atrial fibrillation: Secondary | ICD-10-CM

## 2016-05-05 NOTE — Patient Instructions (Addendum)
Medication Instructions:  Continue current medications  Labwork: None Ordered  Testing/Procedures: Your physician has requested that you have a carotid duplex. This test is an ultrasound of the carotid arteries in your neck. It looks at blood flow through these arteries that supply the brain with blood. Allow one hour for this exam. There are no restrictions or special instructions.   Follow-Up: Your physician wants you to follow-up in: 6 Months. You will receive a reminder letter in the mail two months in advance. If you don't receive a letter, please call our office to schedule the follow-up appointment.   Any Other Special Instructions Will Be Listed Below (If Applicable).   If you need a refill on your cardiac medications before your next appointment, please call your pharmacy.   

## 2016-05-06 ENCOUNTER — Encounter: Payer: Self-pay | Admitting: Cardiology

## 2016-05-08 DIAGNOSIS — I10 Essential (primary) hypertension: Secondary | ICD-10-CM | POA: Diagnosis not present

## 2016-05-13 ENCOUNTER — Ambulatory Visit (INDEPENDENT_AMBULATORY_CARE_PROVIDER_SITE_OTHER): Payer: PPO | Admitting: Internal Medicine

## 2016-05-13 ENCOUNTER — Encounter: Payer: Self-pay | Admitting: Internal Medicine

## 2016-05-13 VITALS — BP 142/84 | HR 60 | Temp 97.7°F | Resp 16 | Ht 64.0 in | Wt 237.2 lb

## 2016-05-13 DIAGNOSIS — N3000 Acute cystitis without hematuria: Secondary | ICD-10-CM

## 2016-05-13 MED ORDER — NITROFURANTOIN MONOHYD MACRO 100 MG PO CAPS: 100.0000 mg | ORAL_CAPSULE | Freq: Two times a day (BID) | ORAL | 0 refills | Status: DC

## 2016-05-13 NOTE — Patient Instructions (Signed)

## 2016-05-13 NOTE — Progress Notes (Signed)
[facility]   Unk Pinto, M.D.    Uvaldo Bristle. Silverio Lay, P.A.-C      Starlyn Skeans, P.A.-C  [facility]                [address] [place], [place] [id number] Telephone [phone] Telefax [phone] Subjective:    Patient ID: Brittney Valentine, female    DOB: 1941/03/16, 75 y.o.   MRN: [id number]  HPI   This delightful 75 yo single WF with HTN, pAfib, HLD, hypothyroidism, NASH w/ cirrhosis and Eso Varices, PreDiabetes & Vit D Deficiency presented with c/o Urinary burning, frequency and urgency and denies fever, chills, rigors, sweats, N/V or GI sx's.   Medication Sig  . VITAMIN D 1000 UNITS  Take 1,000 Units by mouth 2 (two) times daily.   . flecainide 100 MG Take 1 tablet (100 mg total) by mouth 2 (two) times daily.  Marland Kitchen levothyroxine 112 MCG  TAKE ONE TABLET BY MOUTH IN THE MORNING   Allergies  Allergen Reactions  . Ciprofloxacin     Swelling  . Robaxin [Methocarbamol]     palpiations  . Sulfonamide Derivatives     See little dots, skin feels like its burning  . Verapamil     palpitations   Past Medical History:  Diagnosis Date  . Anemia   . Blood transfusion without reported diagnosis   . Cirrhosis ([place])   . Degenerative disk disease   . Diverticulosis   . Hemorrhoids   . Hypertension   . Kidney stones   . Obesity   . Paroxysmal atrial fibrillation (HCC)   . Partial small bowel obstruction   . Personal history of colonic polyps 03/25/2012   8 mm rectal adenoma 03/2012  . Portal hypertensive gastropathy   . Shingles   . Thyroid disease   . Varices, esophageal ([place])   . Zoster    Past Surgical History:  Procedure Laterality Date  . ABDOMINAL HYSTERECTOMY    . APPENDECTOMY  1991  . CHOLECYSTECTOMY  2000  . COLON SURGERY    . COLON SURGERY    . COLONOSCOPY    . ESOPHAGOGASTRODUODENOSCOPY  multiple   Review of Systems  10 point systems review negative except as above.    Objective:   Physical Exam  BP (!) 142/84   Pulse 60   Temp 97.7 F (36.5 C)   Resp 16   Ht 5\' 4"  (1.626 m)   Wt 237 lb 3.2 oz (107.6 kg)   BMI 40.72 kg/m   Patient appears comfortable & in no distress.   Skin - clear w/o exposed rash, cyanosis, icterus.  HEENT - Eac's patent. TM's Nl. EOM's full. PERRLA. NasoOroPharynx clear. Neck - supple. Nl Thyroid. Carotids 2+ & No bruits, nodes, JVD Chest - Clear equal BS w/o Rales, rhonchi, wheezes. Cor - Nl HS. RRR w/o sig MGR. PP 1(+). No edema. Abd - No palpable organomegaly, masses or tenderness. BS nl. MS- FROM w/o deformities. Muscle power, tone and bulk Nl. Gait Nl. Neuro - No obvious Cr N abnormalities. Sensory, motor and Cerebellar functions appear Nl w/o focal abnormalities.    Assessment & Plan:   1. Acute cystitis without hematuria  (has questionable hx/o reaction to Cipro - doubted)   - nitrofurantoin, macrocrystal-monohydrate, (MACROBID) 100 MG capsule; Take 1 capsule (100 mg total) by mouth 2 (two) times daily.  Dispense:  14 capsule; Refill: 0  - Urinalysis, Routine w reflex microscopic - Urine culture

## 2016-05-14 LAB — URINALYSIS, ROUTINE W REFLEX MICROSCOPIC
Bilirubin Urine: NEGATIVE
Glucose, UA: NEGATIVE
KETONES UR: NEGATIVE
NITRITE: NEGATIVE
PH: 6 (ref 5.0–8.0)
Specific Gravity, Urine: 1.019 (ref 1.001–1.035)

## 2016-05-14 LAB — URINALYSIS, MICROSCOPIC ONLY
BACTERIA UA: NONE SEEN [HPF]
Casts: NONE SEEN [LPF]
RBC / HPF: NONE SEEN RBC/HPF (ref <=2)
Squamous Epithelial / LPF: NONE SEEN [HPF] (ref <=5)
WBC UA: NONE SEEN WBC/HPF (ref <=5)
YEAST: NONE SEEN [HPF]

## 2016-05-15 LAB — URINE CULTURE

## 2016-05-19 ENCOUNTER — Encounter: Payer: Self-pay | Admitting: Physician Assistant

## 2016-05-19 ENCOUNTER — Ambulatory Visit (INDEPENDENT_AMBULATORY_CARE_PROVIDER_SITE_OTHER): Payer: PPO | Admitting: Physician Assistant

## 2016-05-19 VITALS — BP 142/100 | HR 85 | Temp 98.6°F | Resp 14 | Ht 64.0 in | Wt 232.0 lb

## 2016-05-19 DIAGNOSIS — I8500 Esophageal varices without bleeding: Secondary | ICD-10-CM | POA: Diagnosis not present

## 2016-05-19 DIAGNOSIS — I480 Paroxysmal atrial fibrillation: Secondary | ICD-10-CM

## 2016-05-19 DIAGNOSIS — Z0001 Encounter for general adult medical examination with abnormal findings: Secondary | ICD-10-CM

## 2016-05-19 DIAGNOSIS — E559 Vitamin D deficiency, unspecified: Secondary | ICD-10-CM | POA: Diagnosis not present

## 2016-05-19 DIAGNOSIS — K766 Portal hypertension: Secondary | ICD-10-CM | POA: Diagnosis not present

## 2016-05-19 DIAGNOSIS — R6889 Other general symptoms and signs: Secondary | ICD-10-CM

## 2016-05-19 DIAGNOSIS — I10 Essential (primary) hypertension: Secondary | ICD-10-CM

## 2016-05-19 DIAGNOSIS — M5432 Sciatica, left side: Secondary | ICD-10-CM | POA: Diagnosis not present

## 2016-05-19 DIAGNOSIS — Z8719 Personal history of other diseases of the digestive system: Secondary | ICD-10-CM | POA: Diagnosis not present

## 2016-05-19 DIAGNOSIS — G4733 Obstructive sleep apnea (adult) (pediatric): Secondary | ICD-10-CM

## 2016-05-19 DIAGNOSIS — K7469 Other cirrhosis of liver: Secondary | ICD-10-CM | POA: Diagnosis not present

## 2016-05-19 DIAGNOSIS — R7303 Prediabetes: Secondary | ICD-10-CM

## 2016-05-19 DIAGNOSIS — E039 Hypothyroidism, unspecified: Secondary | ICD-10-CM | POA: Diagnosis not present

## 2016-05-19 DIAGNOSIS — Z86010 Personal history of colonic polyps: Secondary | ICD-10-CM

## 2016-05-19 DIAGNOSIS — Z79899 Other long term (current) drug therapy: Secondary | ICD-10-CM | POA: Diagnosis not present

## 2016-05-19 DIAGNOSIS — R062 Wheezing: Secondary | ICD-10-CM

## 2016-05-19 DIAGNOSIS — R42 Dizziness and giddiness: Secondary | ICD-10-CM

## 2016-05-19 DIAGNOSIS — Z0000 Encounter for general adult medical examination without abnormal findings: Secondary | ICD-10-CM

## 2016-05-19 MED ORDER — PROMETHAZINE-DM 6.25-15 MG/5ML PO SYRP: 5.0000 mL | ORAL_SOLUTION | Freq: Four times a day (QID) | ORAL | 1 refills | Status: DC | PRN

## 2016-05-19 MED ORDER — DOXYCYCLINE HYCLATE 100 MG PO CAPS: ORAL_CAPSULE | ORAL | 0 refills | Status: DC

## 2016-05-19 MED ORDER — MECLIZINE HCL 25 MG PO TABS: ORAL_TABLET | ORAL | 0 refills | Status: DC

## 2016-05-19 MED ORDER — PREDNISONE 20 MG PO TABS: ORAL_TABLET | ORAL | 0 refills | Status: DC

## 2016-05-19 MED ORDER — LEVOTHYROXINE SODIUM 112 MCG PO TABS: 112.0000 ug | ORAL_TABLET | Freq: Every morning | ORAL | 0 refills | Status: DC

## 2016-05-19 NOTE — Progress Notes (Signed)
MEDICARE ANNUAL WELLNESS VISIT AND ACUTE VISIT  Assessment:   Essential hypertension - continue medications, DASH diet, exercise and monitor at home. Call if greater than 130/80.    Prediabetes Discussed general issues about diabetes pathophysiology and management., Educational material distributed., Suggested low cholesterol diet., Encouraged aerobic exercise., Discussed foot care., Reminded to get yearly retinal exam.   Obesity Obesity with co morbidities- long discussion about weight loss, diet, and exercise   Paroxysmal atrial fibrillation Continue medications, not on coagulation due to bleeding risk, continue cardio follow up.    Esophageal varices Weight loss advised, no tylenol/alochol, continue GI follow up Contraindication to anticoagulation   Portal hypertension Continue GI follow up  Other cirrhosis of liver Weight loss advised, no tylenol/alochol, continue GI follow up  History of small bowel obstruction monitor  Vitamin D deficiency   Medication management  Left sided sciatica Monitor, continue at home exercises, weight loss  History of colonic polyps Continue follow up  Hypothyroidism, unspecified hypothyroidism type Hypothyroidism-check TSH level, continue medications the same, reminded to take on an empty stomach 30-17mins before food.   Encounter for Medicare annual wellness exam Today  Dizziness and giddiness -     meclizine (ANTIVERT) 25 MG tablet; 1/2-1 pill up to 3 times daily for motion sickness/dizziness Normal neuro , if worsening HA, changes vision/speech, imbalance, weakness go to the ER - ? From inner ear, treat with prednisone/doxy ? From dehydration, get labs, versus from macrobid and allergy  Wheezing Weight is normal, get CXR -     predniSONE (DELTASONE) 20 MG tablet; 2 tablets daily for 3 days, 1 tablet daily for 4 days. -     promethazine-dextromethorphan (PROMETHAZINE-DM) 6.25-15 MG/5ML syrup; Take 5 mLs by mouth 4 (four) times  daily as needed for cough. -     DG Chest 2 View; Future -     doxycycline (VIBRAMYCIN) 100 MG capsule; Take 1 capsule twice daily with food     Over 40 minutes of exam, counseling, chart review and critical decision making was performed  Future Appointments Date Time Provider [place]  05/23/2016 12:00 PM [facility] 4 [facility]  08/18/2016 3:30 PM Vicie Mutters, PA-C [facility] None  12/25/2016 9:00 AM Starlyn Skeans, PA-C [facility] None     Plan:   During the course of the visit the patient was educated and counseled about appropriate screening and preventive services including:    Pneumococcal vaccine   Prevnar 13  Influenza vaccine  Td vaccine  Screening electrocardiogram  Bone densitometry screening  Colorectal cancer screening  Diabetes screening  Glaucoma screening  Nutrition counseling   Advanced directives: requested.    Subjective:  Brittney Valentine is a 75 y.o. female who presents for Medicare Annual Wellness Visit and acute visit.   She states that she has been having dizziness/vertigo x Wednesday after starting macrobid for UTI, took for 2-3 days and stopped, she had facial swelling eyes were swollen shut, sinus congestion, took zyrtec that she felt made her sicker, took ibuprofen that helped the swelling but not the dizziness or nausea, dizziness is worse with turning to the right. Has vomited daily x 2-3 days, small volumes, she has not been eating x2-3 days, has had poor intake. S  . Her blood pressure has been controlled at home, today their BP is BP: (!) 142/100 She does workout, walks everyday for 15-30 mins. She denies chest pain, shortness of breath, dizziness.  She has a history of pAfib, NOT on anticoagulation due  to bleeding risk with esophageal varices, and follows with Dr. Irish Lack.  She is not on cholesterol medication and denies myalgias. Her cholesterol is at goal. The cholesterol last visit was:   Lab Results   Component Value Date   CHOL 174 12/26/2015   HDL 80 12/26/2015   LDLCALC 80 12/26/2015   TRIG 68 12/26/2015   CHOLHDL 2.2 12/26/2015    She has been working on diet and exercise for prediabetes, and denies paresthesia of the feet, polydipsia, polyuria and visual disturbances. Last A1C in the office was:  Lab Results  Component Value Date   HGBA1C 5.6 12/26/2015   Patient is on Vitamin D supplement, 1000 IU BID.   Lab Results  Component Value Date   VD25OH 24 (L) 12/26/2015     She is on thyroid medication. Her medication was changed last visit.   Lab Results  Component Value Date   TSH 5.95 (H) 12/26/2015   BMI is Body mass index is 39.82 kg/m., she is working on diet and exercise. Weight is down.  Wt Readings from Last 3 Encounters:  05/19/16 232 lb (105.2 kg)  05/13/16 237 lb 3.2 oz (107.6 kg)  05/05/16 232 lb 9.6 oz (105.5 kg)    Medication Review: Current Outpatient Prescriptions on File Prior to Visit  Medication Sig Dispense Refill  . Cholecalciferol (VITAMIN D-3) 1000 UNITS CAPS Take 1,000 Units by mouth 2 (two) times daily.     . flecainide (TAMBOCOR) 100 MG tablet Take 1 tablet (100 mg total) by mouth 2 (two) times daily. 180 tablet 4   No current facility-administered medications on file prior to visit.     Current Problems (verified) Patient Active Problem List   Diagnosis Date Noted  . Obstructive sleep apnea 01/08/2015  . Essential hypertension 12/21/2014  . Prediabetes 12/21/2014  . Hypothyroidism 12/21/2014  . Encounter for Medicare annual wellness exam 12/20/2014  . Medication management 09/12/2014  . Vitamin D deficiency 09/12/2014  . Atrial fibrillation ([place]) 05/11/2014  . Esophageal varices ([place]) 02/09/2014  . Left sided sciatica 02/09/2014  . History of colonic polyps 03/25/2012  . Obesity 09/27/2009  . History of small bowel obstruction 12/31/2007  . Hepatic cirrhosis ([place]) 12/31/2007  . PORTAL HYPERTENSION 12/31/2007    Screening  Tests Immunization History  Administered Date(s) Administered  . DT 12/21/2014  . Influenza Whole 03/09/2012  . Influenza,inj,Quad PF,36+ Mos 02/09/2014  . Pneumococcal Conjugate-13 12/19/2013  . Pneumococcal Polysaccharide-23 06/09/2004, 12/26/2015  . Td 06/09/2004  . Zoster 12/15/2012    Preventative care: Last colonoscopy: 2013 due 2018 Last mammogram: 11/2015, per pt WNL @ [facility] Last pap smear/pelvic exam: [facility] 2008 WNL, NO Abnormals, Manual 2014 WNL DEXA: 2015 Echo 08/2009  Prior vaccinations: TD or Tdap: 2016 Influenza: 2013 Pneumococcal: 2006 Prevnar 13 2017 Shingles/Zostavax: 2014  Names of Other Physician/Practitioners you currently use: 1. [facility] here for primary care 2. Dr. Gershon Crane, eye doctor, last visit 2017 3. Vivia Ewing, dentist, q 6 months Patient Care Team: Unk Pinto, MD as PCP - General (Internal Medicine) Rutherford Guys, MD as Consulting Physician (Ophthalmology) Gatha Mayer, MD as Consulting Physician (Gastroenterology) Mauricia Area, MD as Consulting Physician (Nephrology) Jettie Booze, MD as Consulting Physician (Cardiology)  Allergies Allergies  Allergen Reactions  . Ciprofloxacin     Swelling  . Robaxin [Methocarbamol]     palpiations  . Sulfonamide Derivatives     See little dots, skin feels like its burning  . Verapamil  palpitations    SURGICAL HISTORY She  has a past surgical history that includes Esophagogastroduodenoscopy (multiple); Colonoscopy; Abdominal hysterectomy; Cholecystectomy (2000); Appendectomy (1991); Colon surgery; and Colon surgery. FAMILY HISTORY Her family history includes Heart attack in her father; Heart failure in her mother; Hypertension in her mother and sister. SOCIAL HISTORY She  reports that she has never smoked. She has never used smokeless tobacco. She reports that she does not drink alcohol or use drugs.  MEDICARE WELLNESS  OBJECTIVES: Physical activity:   Cardiac risk factors:   Depression/mood screen:   Depression screen [facility] 2/9 07/13/2015  Decreased Interest 0  Down, Depressed, Hopeless 0  PHQ - 2 Score 0    ADLs:  No flowsheet data found.   Cognitive Testing  Alert? Yes  Normal Appearance?Yes  Oriented to person? Yes  Place? Yes   Time? Yes  Recall of three objects?  Yes  Can perform simple calculations? Yes  Displays appropriate judgment?Yes  Can read the correct time from a watch face?Yes  EOL planning:    Review of Systems  Constitutional: Positive for malaise/fatigue. Negative for chills, diaphoresis, fever and weight loss.  HENT: Positive for congestion, hearing loss (has hearing aids) and sinus pain. Negative for ear discharge, ear pain, nosebleeds, sore throat and tinnitus.   Eyes: Negative.  Negative for blurred vision and double vision.  Respiratory: Positive for cough, sputum production and wheezing. Negative for hemoptysis, shortness of breath and stridor.   Cardiovascular: Positive for palpitations (occ, follows cardio) and leg swelling. Negative for chest pain, orthopnea, claudication and PND.  Gastrointestinal: Positive for constipation (takes colace). Negative for abdominal pain, blood in stool, diarrhea, heartburn, melena, nausea and vomiting.  Genitourinary: Negative.   Musculoskeletal: Negative for back pain, falls, joint pain, myalgias and neck pain.  Skin: Negative.   Neurological: Positive for dizziness. Negative for tingling, tremors, sensory change, speech change, focal weakness, seizures, loss of consciousness, weakness and headaches.  Psychiatric/Behavioral: Negative for depression, hallucinations, memory loss, substance abuse and suicidal ideas. The patient has insomnia. The patient is not nervous/anxious.      Objective:     Today's Vitals   05/19/16 1533  BP: (!) 142/100  Pulse: 85  Resp: 14  Temp: 98.6 F (37 C)  SpO2: 98%  Weight: 232 lb (105.2 kg)   Height: 5\' 4"  (1.626 m)   Body mass index is 39.82 kg/m.  General appearance: alert, no distress, WD/WN, female HEENT: normocephalic, sclerae anicteric, TMs pearly, nares patent, no discharge or erythema, pharynx normal, crowded mouth.  Oral cavity: MMM, no lesions Neck: supple, no lymphadenopathy, no thyromegaly, no masses Heart: RRR, normal S1, S2, with systolic murmur left sternal border Lungs: CTA bilaterally, no wheezes, rhonchi, or rales Abdomen: +bs, soft, obese,  non tender, non distended, no masses, no hepatomegaly, no splenomegaly Musculoskeletal: nontender, no swelling, no obvious deformity Extremities: 1-2 + edema edema, no cyanosis, no clubbing Pulses: 2+ symmetric, upper and lower extremities, normal cap refill Neurological: alert, oriented x 3, CN2-12 intact, strength normal upper extremities and lower extremities, sensation normal throughout, DTRs 2+ throughout, no cerebellar signs, gait normal Psychiatric: normal affect, behavior normal, pleasant   Medicare Attestation I have personally reviewed: The patient's medical and social history Their use of alcohol, tobacco or illicit drugs Their current medications and supplements The patient's functional ability including ADLs,fall risks, home safety risks, cognitive, and hearing and visual impairment Diet and physical activities Evidence for depression or mood disorders  The patient's weight, height, BMI, and visual  acuity have been recorded in the chart.  I have made referrals, counseling, and provided education to the patient based on review of the above and I have provided the patient with a written personalized care plan for preventive services.     Vicie Mutters, PA-C   05/19/2016

## 2016-05-19 NOTE — Patient Instructions (Addendum)
Get on zantac or over the counter prilosec for your stomach You can take the promethazine/DM syrup that should help cough and stomach Can take meclizine for dizziness or nausea Do not take th meclizine and syrup together Take the prednisone for the wheezing Start the doxycycline Increase fluids  Make sure you are on an allergy pill, see below for more details. Please take the prednisone as directed below, this is NOT an antibiotic so you do NOT have to finish it. You can take it for a few days and stop it if you are doing better.   Please take the prednisone to help decrease inflammation and therefore decrease symptoms. Take it it with food to avoid GI upset. It can cause increased energy but on the other hand it can make it hard to sleep at night so please take it AT [place], it takes 8-12 hours to start working so it will NOT affect your sleeping if you take it at night with your food!!  If you are diabetic it will increase your sugars so decrease carbs and monitor your sugars closely.     Dizziness Dizziness is a common problem. It is a feeling of unsteadiness or light-headedness. You may feel like you are about to faint. Dizziness can lead to injury if you stumble or fall. Anyone can become dizzy, but dizziness is more common in older adults. This condition can be caused by a number of things, including medicines, dehydration, or illness. Follow these instructions at home: Taking these steps may help with your condition: Eating and drinking  Drink enough fluid to keep your urine clear or pale yellow. This helps to keep you from becoming dehydrated. Try to drink more clear fluids, such as water.  Do not drink alcohol.  Limit your caffeine intake if directed by your health care provider.  Limit your salt intake if directed by your health care provider. Activity  Avoid making quick movements.  Rise slowly from chairs and steady yourself until you feel okay.  In the  morning, first sit up on the side of the bed. When you feel okay, stand slowly while you hold onto something until you know that your balance is fine.  Move your legs often if you need to stand in one place for a long time. Tighten and relax your muscles in your legs while you are standing.  Do not drive or operate heavy machinery if you feel dizzy.  Avoid bending down if you feel dizzy. Place items in your home so that they are easy for you to reach without leaning over. Lifestyle  Do not use any tobacco products, including cigarettes, chewing tobacco, or electronic cigarettes. If you need help quitting, ask your health care provider.  Try to reduce your stress level, such as with yoga or meditation. Talk with your health care provider if you need help. General instructions  Watch your dizziness for any changes.  Take medicines only as directed by your health care provider. Talk with your health care provider if you think that your dizziness is caused by a medicine that you are taking.  Tell a friend or a family member that you are feeling dizzy. If he or she notices any changes in your behavior, have this person call your health care provider.  Keep all follow-up visits as directed by your health care provider. This is important. Contact a health care provider if:  Your dizziness does not go away.  Your dizziness or light-headedness gets worse.  You feel nauseous.  You have reduced hearing.  You have new symptoms.  You are unsteady on your feet or you feel like the room is spinning. Get help right away if:  You vomit or have diarrhea and are unable to eat or drink anything.  You have problems talking, walking, swallowing, or using your arms, hands, or legs.  You feel generally weak.  You are not thinking clearly or you have trouble forming sentences. It may take a friend or family member to notice this.  You have chest pain, abdominal pain, shortness of breath, or  sweating.  Your vision changes.  You notice any bleeding.  You have a headache.  You have neck pain or a stiff neck.  You have a fever. This information is not intended to replace advice given to you by your health care provider. Make sure you discuss any questions you have with your health care provider. Document Released: 11/19/2000 Document Revised: 11/01/2015 Document Reviewed: 05/22/2014 Elsevier Interactive Patient Education  2017 [place] VIRAL COUGH AND COLD SYMPTOMS:  -Symptoms usually last at least 1 week with the worst symptoms being around day 4.  - colds usually start with a sore throat and end with a cough, and the cough can take 2 weeks to get better.  -No antibiotics are needed for colds, flu, sore throats, cough, bronchitis UNLESS symptoms are longer than 7 days OR if you are getting better then get drastically worse.  -There are a lot of combination medications (Dayquil, Nyquil, Vicks 44, tyelnol cold and sinus, ETC). Please look at the ingredients on the back so that you are treating the correct symptoms and not doubling up on medications/ingredients.    Medicines you can use  Nasal congestion  - pseudoephedrine (Sudafed)- behind the counter, do not use if you have high blood pressure, medicine that have -D in them.  - phenylephrine (Sudafed PE) -Dextormethorphan + chlorpheniramine (Coridcidin HBP)- okay if you have high blood pressure -Oxymetazoline (Afrin) nasal spray- LIMIT to 3 days -Saline nasal spray -Neti pot (used distilled or bottled water)  Ear pain/congestion  -pseudoephedrine (sudafed) - Nasonex/flonase nasal spray  Fever  -Acetaminophen (Tyelnol) -Ibuprofen (Advil, motrin, aleve)  Sore Throat  -Acetaminophen (Tyelnol) -Ibuprofen (Advil, motrin, aleve) -Drink a lot of water -Gargle with salt water - Rest your voice (don't talk) -Throat sprays -Cough drops  Body Aches  -Acetaminophen (Tyelnol) -Ibuprofen (Advil,  motrin, aleve)  Headache  -Acetaminophen (Tyelnol) -Ibuprofen (Advil, motrin, aleve) - Exedrin, Exedrin Migraine  Allergy symptoms (cough, sneeze, runny nose, itchy eyes) -Claritin or loratadine cheapest but likely the weakest  -Zyrtec or certizine at night because it can make you sleepy -The strongest is allegra or fexafinadine  Cheapest at [employer], [employer], [employer]  Cough  -Dextromethorphan (Delsym)- medicine that has DM in it -Guafenesin (Mucinex/Robitussin) - cough drops - drink lots of water  Chest Congestion  -Guafenesin (Mucinex/Robitussin)  Red Itchy Eyes  - Naphcon-A  Upset Stomach  - Bland diet (nothing spicy, greasy, fried, and high acid foods like tomatoes, oranges, berries) -OKAY- cereal, bread, soup, crackers, rice -Eat smaller more frequent meals -reduce caffeine, no alcohol -Loperamide (Imodium-AD) if diarrhea -Prevacid for heart burn  General health when sick  -Hydration -wash your hands frequently -keep surfaces clean -change pillow cases and sheets often -Get fresh air but do not exercise strenuously -Vitamin D, double up on it - Vitamin C -Zinc

## 2016-05-23 ENCOUNTER — Encounter (HOSPITAL_COMMUNITY): Payer: PPO

## 2016-05-28 ENCOUNTER — Ambulatory Visit (HOSPITAL_COMMUNITY)
Admission: RE | Admit: 2016-05-28 | Discharge: 2016-05-28 | Disposition: A | Discharge status: Home / Self Care | Payer: PPO | Source: Ambulatory Visit | Attending: Cardiology | Admitting: Cardiology

## 2016-05-28 DIAGNOSIS — I6523 Occlusion and stenosis of bilateral carotid arteries: Secondary | ICD-10-CM | POA: Diagnosis not present

## 2016-05-28 DIAGNOSIS — R0989 Other specified symptoms and signs involving the circulatory and respiratory systems: Secondary | ICD-10-CM | POA: Insufficient documentation

## 2016-06-07 DIAGNOSIS — I10 Essential (primary) hypertension: Secondary | ICD-10-CM | POA: Diagnosis not present

## 2016-06-11 ENCOUNTER — Other Ambulatory Visit: Payer: Self-pay | Admitting: Family Medicine

## 2016-07-02 DIAGNOSIS — M170 Bilateral primary osteoarthritis of knee: Secondary | ICD-10-CM | POA: Diagnosis not present

## 2016-07-02 DIAGNOSIS — M25562 Pain in left knee: Secondary | ICD-10-CM | POA: Diagnosis not present

## 2016-07-02 DIAGNOSIS — M25561 Pain in right knee: Secondary | ICD-10-CM | POA: Diagnosis not present

## 2016-07-08 DIAGNOSIS — I10 Essential (primary) hypertension: Secondary | ICD-10-CM | POA: Diagnosis not present

## 2016-08-06 DIAGNOSIS — I10 Essential (primary) hypertension: Secondary | ICD-10-CM | POA: Diagnosis not present

## 2016-08-18 ENCOUNTER — Ambulatory Visit: Payer: Self-pay | Admitting: Physician Assistant

## 2016-09-05 DIAGNOSIS — I10 Essential (primary) hypertension: Secondary | ICD-10-CM | POA: Diagnosis not present

## 2016-09-11 ENCOUNTER — Ambulatory Visit (INDEPENDENT_AMBULATORY_CARE_PROVIDER_SITE_OTHER): Payer: PPO | Admitting: Internal Medicine

## 2016-09-11 VITALS — BP 150/82 | HR 74 | Temp 97.9°F | Wt 240.0 lb

## 2016-09-11 DIAGNOSIS — J069 Acute upper respiratory infection, unspecified: Secondary | ICD-10-CM | POA: Diagnosis not present

## 2016-09-11 MED ORDER — ALBUTEROL SULFATE HFA 108 (90 BASE) MCG/ACT IN AERS: 2.0000 | INHALATION_SPRAY | RESPIRATORY_TRACT | 0 refills | Status: DC | PRN

## 2016-09-11 MED ORDER — FLUTICASONE-UMECLIDIN-VILANT 100-62.5-25 MCG/INH IN AEPB: 1.0000 | INHALATION_SPRAY | Freq: Every day | RESPIRATORY_TRACT | 0 refills | Status: DC

## 2016-09-11 MED ORDER — BENZONATATE 100 MG PO CAPS: 100.0000 mg | ORAL_CAPSULE | Freq: Four times a day (QID) | ORAL | 1 refills | Status: DC | PRN

## 2016-09-11 MED ORDER — PREDNISONE 20 MG PO TABS: ORAL_TABLET | ORAL | 0 refills | Status: DC

## 2016-09-11 MED ORDER — FLUTICASONE PROPIONATE 50 MCG/ACT NA SUSP: 2.0000 | Freq: Every day | NASAL | 0 refills | Status: DC

## 2016-09-11 NOTE — Patient Instructions (Addendum)
Please use trelegy inhaler once daily in the morning.  Make sure you rinse your mouth out after you use it.  Please use flonase 2 sprays per nostril in each nostril at bedtime.  Do not sniff it up.  It sprays where it is designed to go.  Please take claritin, zyrtec, or allegra once daily.  You may take benadryl 50 mg at bedtime.  Please take zpak until it is completely gone.  Please take tessalon tablets every 8 hours for cough  Please use albuterol inhaler every 6-8 hours as needed for shortness of breath or wheezing.    You can still take the phenergan DM cough syrup if you still have it.

## 2016-09-11 NOTE — Progress Notes (Signed)
HPI  Patient presents to the office for evaluation of cough.  It has been going on for 4 days.  Patient reports night > day, wet, worse with lying down.  They also endorse change in voice, chills, postnasal drip, shortness of breath, wheezing and nasal congestion, yellow nasal congestion, sore throat, ear popping.  .  They have tried none.  They report that nothing has worked.  They denies other sick contacts.  Review of Systems  Constitutional: Positive for malaise/fatigue. Negative for chills and fever.  HENT: Positive for congestion, ear pain, hearing loss and sore throat.   Respiratory: Positive for cough. Negative for sputum production, shortness of breath and wheezing.   Cardiovascular: Negative for chest pain, palpitations and leg swelling.  Neurological: Positive for headaches.    PE:  Vitals:   09/11/16 1534  BP: (!) 150/82  Pulse: 74  Temp: 97.9 F (36.6 C)    General:  Alert and non-toxic, WDWN, NAD HEENT: NCAT, PERLA, EOM normal, no occular discharge or erythema.  Nasal mucosal edema with sinus tenderness to palpation.  Oropharynx clear with minimal oropharyngeal edema and erythema.  Mucous membranes moist and pink. Neck:  Cervical adenopathy Chest:  RRR no MRGs.  Lungs clear to auscultation A&P with no wheezes rhonchi or rales.   Abdomen: +BS x 4 quadrants, soft, non-tender, no guarding, rigidity, or rebound. Skin: warm and dry no rash Neuro: A&Ox4, CN II-XII grossly intact  Assessment and Plan:   1. Acute URI -cont nasal saline -cont daily antihistamine - Fluticasone-Umeclidin-Vilant (TRELEGY ELLIPTA) 100-62.5-25 MCG/INH AEPB; Inhale 1 Dose into the lungs daily.  Dispense: 1 each; Refill: 0 - predniSONE (DELTASONE) 20 MG tablet; 3 tabs po daily x 3 days, then 2 tabs x 3 days, then 1.5 tabs x 3 days, then 1 tab x 3 days, then 0.5 tabs x 3 days  Dispense: 27 tablet; Refill: 0 - fluticasone (FLONASE) 50 MCG/ACT nasal spray; Place 2 sprays into both nostrils daily.   Dispense: 16 g; Refill: 0 - benzonatate (TESSALON PERLES) 100 MG capsule; Take 1 capsule (100 mg total) by mouth every 6 (six) hours as needed for cough.  Dispense: 30 capsule; Refill: 1 - albuterol (PROVENTIL HFA;VENTOLIN HFA) 108 (90 Base) MCG/ACT inhaler; Inhale 2 puffs into the lungs every 2 (two) hours as needed for wheezing or shortness of breath (cough).  Dispense: 1 Inhaler; Refill: 0

## 2016-09-18 ENCOUNTER — Other Ambulatory Visit: Payer: Self-pay | Admitting: Internal Medicine

## 2016-09-18 ENCOUNTER — Encounter: Payer: Self-pay | Admitting: Internal Medicine

## 2016-10-06 DIAGNOSIS — I10 Essential (primary) hypertension: Secondary | ICD-10-CM | POA: Diagnosis not present

## 2016-10-21 ENCOUNTER — Encounter: Payer: Self-pay | Admitting: *Deleted

## 2016-11-05 DIAGNOSIS — I10 Essential (primary) hypertension: Secondary | ICD-10-CM | POA: Diagnosis not present

## 2016-11-17 DIAGNOSIS — Z1231 Encounter for screening mammogram for malignant neoplasm of breast: Secondary | ICD-10-CM | POA: Diagnosis not present

## 2016-11-17 DIAGNOSIS — M8589 Other specified disorders of bone density and structure, multiple sites: Secondary | ICD-10-CM | POA: Diagnosis not present

## 2016-12-01 ENCOUNTER — Other Ambulatory Visit: Payer: Self-pay

## 2016-12-01 DIAGNOSIS — E039 Hypothyroidism, unspecified: Secondary | ICD-10-CM

## 2016-12-01 MED ORDER — LEVOTHYROXINE SODIUM 112 MCG PO TABS: 112.0000 ug | ORAL_TABLET | Freq: Every morning | ORAL | 0 refills | Status: DC

## 2016-12-06 DIAGNOSIS — I10 Essential (primary) hypertension: Secondary | ICD-10-CM | POA: Diagnosis not present

## 2016-12-25 ENCOUNTER — Encounter: Payer: Self-pay | Admitting: Internal Medicine

## 2016-12-29 ENCOUNTER — Encounter: Payer: Self-pay | Admitting: Internal Medicine

## 2016-12-30 ENCOUNTER — Encounter: Payer: Self-pay | Admitting: Internal Medicine

## 2016-12-31 DIAGNOSIS — M170 Bilateral primary osteoarthritis of knee: Secondary | ICD-10-CM | POA: Diagnosis not present

## 2016-12-31 DIAGNOSIS — M25562 Pain in left knee: Secondary | ICD-10-CM | POA: Diagnosis not present

## 2016-12-31 DIAGNOSIS — M25561 Pain in right knee: Secondary | ICD-10-CM | POA: Diagnosis not present

## 2017-01-05 DIAGNOSIS — I10 Essential (primary) hypertension: Secondary | ICD-10-CM | POA: Diagnosis not present

## 2017-01-07 ENCOUNTER — Encounter: Payer: Self-pay | Admitting: Physician Assistant

## 2017-02-05 DIAGNOSIS — I10 Essential (primary) hypertension: Secondary | ICD-10-CM | POA: Diagnosis not present

## 2017-02-13 NOTE — Progress Notes (Signed)
Complete Physical  Assessment and Plan:  PORTAL HYPERTENSION Follow up GI -     Hepatic function panel  Esophageal varices without bleeding, unspecified esophageal varices type (HCC) -     Hepatic function panel - follow up GI can not tolerate BB  Paroxysmal atrial fibrillation (HCC) No anticoagulant, on flecainide, continue follow up cardio  Essential hypertension - continue medications, DASH diet, exercise and monitor at home. Call if greater than 130/80.  -     CBC with Differential/Platelet -     BASIC METABOLIC PANEL WITH GFR -     Hepatic function panel -     TSH -     Urinalysis w microscopic + reflex cultur -     Microalbumin / creatinine urine ratio -     EKG 12-Lead  Other cirrhosis of liver (HCC) Weight loss advised, follow up GI -     Hepatic function panel  Obstructive sleep apnea Sleep apnea- continue CPAP, CPAP is helping with daytime fatigue, weight loss still advised.   Hypothyroidism, unspecified type Hypothyroidism-check TSH level, continue medications the same, reminded to take on an empty stomach 30-1mins before food.  -     TSH  Medication management -     Magnesium  Vitamin D deficiency -     VITAMIN D 25 Hydroxy (Vit-D Deficiency, Fractures)  Prediabetes Discussed general issues about diabetes pathophysiology and management., Educational material distributed., Suggested low cholesterol diet., Encouraged aerobic exercise., Discussed foot care., Reminded to get yearly retinal exam. -     Hemoglobin A1c  History of small bowel obstruction WNL now  History of colonic polyps Will scheudle follow up  Left sided sciatica Monitor  Mixed hyperlipidemia -continue medications, check lipids, decrease fatty foods, increase activity.  -     Lipid panel  Anemia, unspecified type -     Iron,Total/Total Iron Binding Cap -     Vitamin B12  Yeast dermatitis Keep area clean and dry, use creams PRN, will monitor -     nystatin (MYCOSTATIN/NYSTOP)  powder; Apply daily after a shower as needed -     nystatin cream (MYCOSTATIN); Apply 1 application topically 2 (two) times daily.   Discussed med's effects and SE's. Screening labs and tests as requested with regular follow-up as recommended.  HPI  76 y.o. female  presents for a complete physical.  Her blood pressure has been controlled at home for 130/70, today their BP is BP: (!) 142/90 (recheck).  She does workout. She denies chest pain, shortness of breath, dizziness.  Had recent follow up with cardiology. She is still taking tambicor.  She has been tried on multiple blood pressure medications and she refused them.  She has history of cirrhosis, follows GI, has esophageal varices, not on anticoagulant.  She is not on cholesterol medication and denies myalgias. Her cholesterol is at goal. The cholesterol last visit was:  Lab Results  Component Value Date   CHOL 174 12/26/2015   HDL 80 12/26/2015   LDLCALC 80 12/26/2015   TRIG 68 12/26/2015   CHOLHDL 2.2 12/26/2015  .  She has been working on diet and exercise for prediabetes.  Last A1C in the office was:  Lab Results  Component Value Date   HGBA1C 5.6 12/26/2015   Patient is on Vitamin D supplement, on 2000 a day.  Lab Results  Component Value Date   VD25OH 24 (L) 12/26/2015     She is on thyroid medication. Her medication was changed last visit, BUT SHE  HAS STAYED ON one pill a day.    Lab Results  Component Value Date   TSH 5.95 (H) 12/26/2015  .  BMI is Body mass index is 40.34 kg/m., she is working on diet and exercise. Wt Readings from Last 3 Encounters:  02/16/17 235 lb (106.6 kg)  09/11/16 240 lb (108.9 kg)  05/19/16 232 lb (105.2 kg)     Current Medications:  Current Outpatient Prescriptions on File Prior to Visit  Medication Sig Dispense Refill  . Cholecalciferol (VITAMIN D-3) 1000 UNITS CAPS Take 1,000 Units by mouth 2 (two) times daily.     . flecainide (TAMBOCOR) 100 MG tablet Take 1 tablet (100 mg  total) by mouth 2 (two) times daily. 180 tablet 4  . levothyroxine (SYNTHROID, LEVOTHROID) 112 MCG tablet Take 1 tablet (112 mcg total) by mouth every morning. 90 tablet 0  . fluticasone (FLONASE) 50 MCG/ACT nasal spray Place 2 sprays into both nostrils daily. (Patient not taking: Reported on 02/16/2017) 16 g 0   No current facility-administered medications on file prior to visit.     Health Maintenance:   Immunization History  Administered Date(s) Administered  . DT 12/21/2014  . Influenza Whole 03/09/2012  . Influenza,inj,Quad PF,6+ Mos 02/09/2014  . Pneumococcal Conjugate-13 12/19/2013  . Pneumococcal Polysaccharide-23 06/09/2004, 12/26/2015  . Td 06/09/2004  . Zoster 12/15/2012    Tetanus: 2016 Pneumovax: 2017 Prevnar 2015 Flu vaccine: declines had palpitations last time Zostavax: 2014 Pap: Hysterectomy MGM: 11/2016  DEXA: 2018 osteopenia -1.6 Colonoscopy: 2013, Due 2018 will make OV EGD: 2013 [place] AB 12/2013 Echo 2011  Gershon Crane follows up next week  Patient Care Team: Unk Pinto, MD as PCP - General (Internal Medicine) Rutherford Guys, MD as Consulting Physician (Ophthalmology) Gatha Mayer, MD as Consulting Physician (Gastroenterology) Deterding, Jeneen Rinks, MD as Consulting Physician (Nephrology) Jettie Booze, MD as Consulting Physician (Cardiology)   Medical History:  Past Medical History:  Diagnosis Date  . Anemia   . Blood transfusion without reported diagnosis   . Cirrhosis ([place])   . Degenerative disk disease   . Diverticulosis   . Hemorrhoids   . Hypertension   . Kidney stones   . Obesity   . Paroxysmal atrial fibrillation (HCC)   . Partial small bowel obstruction ([place])   . Personal history of colonic polyps 03/25/2012   8 mm rectal adenoma 03/2012  . Portal hypertensive gastropathy ([place])   . Shingles   . Thyroid disease   . Varices, esophageal ([place])   . Zoster     Allergies Allergies  Allergen Reactions  . Ciprofloxacin      Swelling  . Robaxin [Methocarbamol]     palpiations  . Sulfonamide Derivatives     See little dots, skin feels like its burning  . Verapamil     palpitations    SURGICAL HISTORY She  has a past surgical history that includes Esophagogastroduodenoscopy (multiple); Colonoscopy; Abdominal hysterectomy; Cholecystectomy (2000); Appendectomy (1991); Colon surgery; and Colon surgery. FAMILY HISTORY Her family history includes Heart attack in her father; Heart failure in her mother; Hypertension in her mother and sister. SOCIAL HISTORY She  reports that she has never smoked. She has never used smokeless tobacco. She reports that she does not drink alcohol or use drugs.  Review of Systems: Review of Systems  Constitutional: Negative for chills, fever and malaise/fatigue.  HENT: Negative for congestion, ear pain and sore throat.   Eyes: Negative.   Respiratory: Negative for cough, shortness of breath and wheezing.  Cardiovascular: Negative for chest pain, palpitations and leg swelling.  Gastrointestinal: Negative for abdominal pain, blood in stool, constipation, diarrhea, heartburn and melena.  Genitourinary: Negative.   Skin: Positive for rash. Negative for itching.  Neurological: Negative for dizziness, sensory change, loss of consciousness and headaches.  Psychiatric/Behavioral: Negative for depression. The patient is not nervous/anxious and does not have insomnia.     Physical Exam: Estimated body mass index is 40.34 kg/m as calculated from the following:   Height as of this encounter: 5\' 4"  (1.626 m).   Weight as of this encounter: 235 lb (106.6 kg). BP (!) 142/90 Comment: recheck  Pulse 65   Temp 97.9 F (36.6 C)   Ht 5\' 4"  (1.626 m)   Wt 235 lb (106.6 kg)   SpO2 97%   BMI 40.34 kg/m   General Appearance: Well nourished well developed, in no apparent distress.  Eyes: PERRLA, EOMs, conjunctiva no swelling or erythema ENT/Mouth: Ear canals normal without obstruction,  swelling, erythema, or discharge.  TMs normal bilaterally with no erythema, + bilateral effusion.  Oropharynx moist and clear with no exudate, erythema, or swelling.   Neck: Supple, thyroid normal. No bruits.  No cervical adenopathy Respiratory: Respiratory effort normal, Breath sounds clear A&P without wheeze, rhonchi, rales.   Cardio: RRR with 3/6 systolic murmurs, rubs or gallops. Brisk peripheral pulses with 1-2+ edema.  Chest: symmetric, with normal excursions Breasts: defer Abdomen: Soft, nontender, no guarding, rebound, hernias, masses, or organomegaly.  Lymphatics: Non tender without lymphadenopathy.  Genitourinary: defer Musculoskeletal: Full ROM all peripheral extremities,5/5 strength, and normal gait.  Skin: inguinal folds with erythematous patches, well healing left back.  Warm, dry without  lesions, ecchymosis. Neuro: Awake and oriented X 3, Cranial nerves intact, reflexes equal bilaterally. Normal muscle tone, no cerebellar symptoms. Sensation intact.  Psych:  normal affect, Insight and Judgment appropriate.   EKG: WNL no changes.  Over 40 minutes of exam, counseling, chart review and critical decision making was performed  Vicie Mutters 11:05 AM [facility]

## 2017-02-16 ENCOUNTER — Encounter: Payer: Self-pay | Admitting: Physician Assistant

## 2017-02-16 ENCOUNTER — Ambulatory Visit (INDEPENDENT_AMBULATORY_CARE_PROVIDER_SITE_OTHER): Payer: PPO | Admitting: Physician Assistant

## 2017-02-16 VITALS — BP 142/90 | HR 65 | Temp 97.9°F | Ht 64.0 in | Wt 235.0 lb

## 2017-02-16 DIAGNOSIS — R7303 Prediabetes: Secondary | ICD-10-CM

## 2017-02-16 DIAGNOSIS — G4733 Obstructive sleep apnea (adult) (pediatric): Secondary | ICD-10-CM | POA: Diagnosis not present

## 2017-02-16 DIAGNOSIS — B372 Candidiasis of skin and nail: Secondary | ICD-10-CM

## 2017-02-16 DIAGNOSIS — D649 Anemia, unspecified: Secondary | ICD-10-CM

## 2017-02-16 DIAGNOSIS — E782 Mixed hyperlipidemia: Secondary | ICD-10-CM

## 2017-02-16 DIAGNOSIS — E559 Vitamin D deficiency, unspecified: Secondary | ICD-10-CM

## 2017-02-16 DIAGNOSIS — I10 Essential (primary) hypertension: Secondary | ICD-10-CM

## 2017-02-16 DIAGNOSIS — Z79899 Other long term (current) drug therapy: Secondary | ICD-10-CM

## 2017-02-16 DIAGNOSIS — K7469 Other cirrhosis of liver: Secondary | ICD-10-CM

## 2017-02-16 DIAGNOSIS — R6889 Other general symptoms and signs: Secondary | ICD-10-CM

## 2017-02-16 DIAGNOSIS — M5432 Sciatica, left side: Secondary | ICD-10-CM

## 2017-02-16 DIAGNOSIS — I480 Paroxysmal atrial fibrillation: Secondary | ICD-10-CM

## 2017-02-16 DIAGNOSIS — Z0000 Encounter for general adult medical examination without abnormal findings: Secondary | ICD-10-CM | POA: Diagnosis not present

## 2017-02-16 DIAGNOSIS — Z8719 Personal history of other diseases of the digestive system: Secondary | ICD-10-CM

## 2017-02-16 DIAGNOSIS — I8500 Esophageal varices without bleeding: Secondary | ICD-10-CM

## 2017-02-16 DIAGNOSIS — K766 Portal hypertension: Secondary | ICD-10-CM

## 2017-02-16 DIAGNOSIS — E039 Hypothyroidism, unspecified: Secondary | ICD-10-CM | POA: Diagnosis not present

## 2017-02-16 DIAGNOSIS — Z86010 Personal history of colonic polyps: Secondary | ICD-10-CM

## 2017-02-16 MED ORDER — NYSTATIN 100000 UNIT/GM EX CREA: 1.0000 "application " | TOPICAL_CREAM | Freq: Two times a day (BID) | CUTANEOUS | 1 refills | Status: DC

## 2017-02-16 MED ORDER — NYSTATIN 100000 UNIT/GM EX POWD: CUTANEOUS | 2 refills | Status: DC

## 2017-02-16 NOTE — Patient Instructions (Addendum)
CALL ABOUT COLONOSCOPY WITH DR. Carlean Purl  HOME CARE INSTRUCTIONS   Do not stand or sit in one position for long periods of time. Do not sit with your legs crossed. Rest with your legs raised during the day.  Your legs have to be higher than your heart so that gravity will force the valves to open, so please really elevate your legs.   Wear elastic stockings or support hose. Do not wear other tight, encircling garments around the legs, pelvis, or waist.  [employer]  has a wide variety of well priced compression stockings. [place], [place] [address] [phone]  Walk as much as possible to increase blood flow.  Raise the foot of your bed at night with 2-inch blocks. SEEK MEDICAL CARE IF:   The skin around your ankle starts to break down.  You have pain, redness, tenderness, or hard swelling developing in your leg over a vein.  You are uncomfortable due to leg pain. Document Released: 03/05/2005 Document Revised: 08/18/2011 Document Reviewed: 07/22/2010 [facility] Patient Information 2014 [place].   Fish oil can be obtained from eating fish or by taking supplements. Fish that are especially rich in the beneficial oils known as omega-3 fatty acids include mackerel, tuna, salmon, sturgeon, mullet, bluefish, anchovy, sardines, herring, trout, and menhaden. They provide about 1 gram of omega-3 fatty acids in about 3.5 ounces of fish.  Fish oil supplements are usually made from mackerel, herring, tuna, halibut, salmon, cod liver, whale blubber, or seal blubber. Fish oil supplements often contain small amounts of vitamin E to prevent spoilage. They might also be combined with calcium, iron, or vitamins A, B1, B2, B3, C, or D.  Fish oil is used for a wide range of conditions. It is most often used for conditions related to the heart and blood system. Some people use fish oil to lower blood pressure or triglyceride levels (fats related to cholesterol). Fish oil has also  been tried for preventing heart disease or stroke. The scientific evidence suggests that fish oil really does lower high triglycerides, and it also seems to help prevent heart disease and stroke when taken in the recommended amounts. Ironically, taking too much fish oil can actually increase the risk of stroke.  Fish may have earned its reputation as "brain food" because some people eat fish to help with depression, psychosis, attention deficit-hyperactivity disorder (ADHD), Alzheimer's disease, and other thinking disorders.  Some people use fish oil for dry eyes, glaucoma, and age-related macular degeneration (AMD), a very common condition in older people that can lead to serious sight problems.  Women sometimes take fish oil to prevent painful periods; breast pain; and complications associated with pregnancy such as miscarriage, high blood pressure late in pregnancy, and early delivery.  Fish oil is also used for diabetes, asthma, developmental coordination disorders, movement disorders, dyslexia, obesity, kidney disease, weak bones (osteoporosis), certain diseases related to pain and swelling such as psoriasis, and preventing weight loss caused by some cancer drugs.  Fish oil is sometimes used after heart transplant surgery to prevent high blood pressure and kidney damage that can be caused by the surgery itself or by drugs used to reduce the chances that the body will reject the new heart. Fish oil is sometimes used after coronary artery bypass surgery. It seems to help keep the blood vessel that has been rerouted from closing up.  When fish oil is obtained by eating fish, the way the fish is prepared seems to make  a difference. Eating broiled or baked fish appears to reduce the risk of heart disease, but eating fried fish or fish sandwiches not only cancels out the benefits of fish oil, but may actually increase heart disease risk.  Two of the most important omega-3 fatty acids contained in fish  oil are eicosapentaenoic acid (EPA) and docosahexaenoic acid (DHA).  How does it work? A lot of the benefit of fish oil seems to come from the omega-3 fatty acids that it contains. Interestingly, the body does not produce its own omega-3 fatty acids. Nor can the body make omega-3 fatty acids from omega-6 fatty acids, which are common in the Western diet. A lot of research has been done on EPA and DHA, two types of omega-3 acids that are often included in fish oil supplements.  Omega-3 fatty acids reduce pain and swelling. This may explain why fish oil is likely effective for psoriasis and dry eyes. These fatty acids also prevent the blood from clotting easily. this might make fish oil helpful for some heart conditions.   Your ears and sinuses are connected by the eustachian tube. When your sinuses are inflamed, this can close off the tube and cause fluid to collect in your middle ear. This can then cause dizziness, popping, clicking, ringing, and echoing in your ears. This is often NOT an infection and does NOT require antibiotics, it is caused by inflammation so the treatments help the inflammation. This can take a long time to get better so please be patient.  Here are things you can do to help with this: - Try the Flonase or Nasonex. Remember to spray each nostril twice towards the outer part of your eye.  Do not sniff but instead pinch your nose and tilt your head back to help the medicine get into your sinuses.  The best time to do this is at bedtime.Stop if you get blurred vision or nose bleeds.  -While drinking fluids, pinch and hold nose close and swallow, to help open eustachian tubes to drain fluid behind ear drums. -Please pick one of the over the counter allergy medications below and take it once daily for allergies.  It will also help with fluid behind ear drums. Claritin or loratadine cheapest but likely the weakest  Zyrtec or certizine at night because it can make you sleepy The  strongest is allegra or fexafinadine  Cheapest at [employer], [employer], [employer] -can use decongestant over the counter, please do not use if you have high blood pressure or certain heart conditions.   if worsening HA, changes vision/speech, imbalance, weakness go to the ER   Monitor your blood pressure at home. Go to the ER if any CP, SOB, nausea, dizziness, severe HA, changes vision/speech  Goal BP:  For patients younger than 60: Goal BP < 140/90. For patients 60 and older: Goal BP < 150/90. For patients with diabetes: Goal BP < 140/90. Your most recent BP: BP: (!) 160/90   Take your medications faithfully as instructed. Maintain a healthy weight. Get at least 150 minutes of aerobic exercise per week. Minimize salt intake. Minimize alcohol intake  DASH Eating Plan DASH stands for "Dietary Approaches to Stop Hypertension." The DASH eating plan is a healthy eating plan that has been shown to reduce high blood pressure (hypertension). Additional health benefits may include reducing the risk of type 2 diabetes mellitus, heart disease, and stroke. The DASH eating plan may also help with weight loss. WHAT DO I NEED TO KNOW ABOUT THE DASH  EATING PLAN? For the DASH eating plan, you will follow these general guidelines:  Choose foods with a percent daily value for sodium of less than 5% (as listed on the food label).  Use salt-free seasonings or herbs instead of table salt or sea salt.  Check with your health care provider or pharmacist before using salt substitutes.  Eat lower-sodium products, often labeled as "lower sodium" or "no salt added."  Eat fresh foods.  Eat more vegetables, fruits, and low-fat dairy products.  Choose whole grains. Look for the word "whole" as the first word in the ingredient list.  Choose fish and skinless chicken or [place] more often than red meat. Limit fish, poultry, and meat to 6 oz (170 g) each day.  Limit sweets, desserts, sugars, and sugary  drinks.  Choose heart-healthy fats.  Limit cheese to 1 oz (28 g) per day.  Eat more home-cooked food and less restaurant, buffet, and fast food.  Limit fried foods.  Cook foods using methods other than frying.  Limit canned vegetables. If you do use them, rinse them well to decrease the sodium.  When eating at a restaurant, ask that your food be prepared with less salt, or no salt if possible. WHAT FOODS CAN I EAT? Seek help from a dietitian for individual calorie needs. Grains Whole grain or whole wheat bread. Brown rice. Whole grain or whole wheat pasta. Quinoa, bulgur, and whole grain cereals. Low-sodium cereals. Corn or whole wheat flour tortillas. Whole grain cornbread. Whole grain crackers. Low-sodium crackers. Vegetables Fresh or frozen vegetables (raw, steamed, roasted, or grilled). Low-sodium or reduced-sodium tomato and vegetable juices. Low-sodium or reduced-sodium tomato sauce and paste. Low-sodium or reduced-sodium canned vegetables.  Fruits All fresh, canned (in natural juice), or frozen fruits. Meat and Other Protein Products Ground beef (85% or leaner), grass-fed beef, or beef trimmed of fat. Skinless chicken or [place]. Ground chicken or [place]. Pork trimmed of fat. All fish and seafood. Eggs. Dried beans, peas, or lentils. Unsalted nuts and seeds. Unsalted canned beans. Dairy Low-fat dairy products, such as skim or 1% milk, 2% or reduced-fat cheeses, low-fat ricotta or cottage cheese, or plain low-fat yogurt. Low-sodium or reduced-sodium cheeses. Fats and Oils Tub margarines without trans fats. Light or reduced-fat mayonnaise and salad dressings (reduced sodium). Avocado. Safflower, olive, or canola oils. Natural peanut or almond butter. Other Unsalted popcorn and pretzels. The items listed above may not be a complete list of recommended foods or beverages. Contact your dietitian for more options. WHAT FOODS ARE NOT RECOMMENDED? Grains White bread. White pasta.  White rice. Refined cornbread. Bagels and croissants. Crackers that contain trans fat. Vegetables Creamed or fried vegetables. Vegetables in a cheese sauce. Regular canned vegetables. Regular canned tomato sauce and paste. Regular tomato and vegetable juices. Fruits Dried fruits. Canned fruit in light or heavy syrup. Fruit juice. Meat and Other Protein Products Fatty cuts of meat. Ribs, chicken wings, bacon, sausage, bologna, salami, chitterlings, fatback, hot dogs, bratwurst, and packaged luncheon meats. Salted nuts and seeds. Canned beans with salt. Dairy Whole or 2% milk, cream, half-and-half, and cream cheese. Whole-fat or sweetened yogurt. Full-fat cheeses or blue cheese. Nondairy creamers and whipped toppings. Processed cheese, cheese spreads, or cheese curds. Condiments Onion and garlic salt, seasoned salt, table salt, and sea salt. Canned and packaged gravies. Worcestershire sauce. Tartar sauce. Barbecue sauce. Teriyaki sauce. Soy sauce, including reduced sodium. Steak sauce. Fish sauce. Oyster sauce. Cocktail sauce. Horseradish. Ketchup and mustard. Meat flavorings and tenderizers. Bouillon cubes. Hot sauce.  Tabasco sauce. Marinades. Taco seasonings. Relishes. Fats and Oils Butter, stick margarine, lard, shortening, ghee, and bacon fat. Coconut, palm kernel, or palm oils. Regular salad dressings. Other Pickles and olives. Salted popcorn and pretzels. The items listed above may not be a complete list of foods and beverages to avoid. Contact your dietitian for more information. WHERE CAN I FIND MORE INFORMATION? National Heart, Lung, and Blood Institute: travelstabloid.com Document Released: 05/15/2011 Document Revised: 10/10/2013 Document Reviewed: 03/30/2013 [facility] Patient Information 2015 [place], [place]. This information is not intended to replace advice given to you by your health care provider. Make sure you discuss any questions you have with your  health care provider.

## 2017-02-17 LAB — BASIC METABOLIC PANEL WITH GFR
BUN: 10 mg/dL (ref 7–25)
CALCIUM: 9.3 mg/dL (ref 8.6–10.4)
CHLORIDE: 107 mmol/L (ref 98–110)
CO2: 26 mmol/L (ref 20–32)
Creat: 0.65 mg/dL (ref 0.60–0.93)
GFR, EST NON AFRICAN AMERICAN: 88 mL/min/{1.73_m2} (ref >=60)
GFR, Est African American: 102 mL/min/{1.73_m2} (ref >=60)
GLUCOSE: 99 mg/dL (ref 65–99)
POTASSIUM: 3.9 mmol/L (ref 3.5–5.3)
Sodium: 139 mmol/L (ref 135–146)

## 2017-02-17 LAB — HEPATIC FUNCTION PANEL
AG Ratio: 0.9 (calc) — ABNORMAL LOW (ref 1.0–2.5)
ALT: 25 U/L (ref 6–29)
AST: 56 U/L — AB (ref 10–35)
Albumin: 3.1 g/dL — ABNORMAL LOW (ref 3.6–5.1)
Alkaline phosphatase (APISO): 93 U/L (ref 33–130)
Bilirubin, Direct: 0.5 mg/dL — ABNORMAL HIGH (ref 0.0–0.2)
Globulin: 3.6 g/dL (calc) (ref 1.9–3.7)
Indirect Bilirubin: 1.5 mg/dL (calc) — ABNORMAL HIGH (ref 0.2–1.2)
TOTAL PROTEIN: 6.7 g/dL (ref 6.1–8.1)
Total Bilirubin: 2 mg/dL — ABNORMAL HIGH (ref 0.2–1.2)

## 2017-02-17 LAB — URINALYSIS W MICROSCOPIC + REFLEX CULTURE
BILIRUBIN URINE: NEGATIVE
Bacteria, UA: NONE SEEN /HPF
Glucose, UA: NEGATIVE
Hyaline Cast: NONE SEEN /LPF
KETONES UR: NEGATIVE
LEUKOCYTE ESTERASE: NEGATIVE
Nitrites, Initial: NEGATIVE
PH: 7 (ref 5.0–8.0)
Protein, ur: NEGATIVE
RBC / HPF: NONE SEEN /HPF (ref 0–2)
SPECIFIC GRAVITY, URINE: 1.011 (ref 1.001–1.03)
Squamous Epithelial / LPF: NONE SEEN /HPF (ref <=5)
WBC, UA: NONE SEEN /HPF (ref 0–5)

## 2017-02-17 LAB — CBC WITH DIFFERENTIAL/PLATELET
BASOS PCT: 0.9 %
Basophils Absolute: 42 cells/uL (ref 0–200)
EOS PCT: 4.1 %
Eosinophils Absolute: 193 cells/uL (ref 15–500)
HEMATOCRIT: 41.2 % (ref 35.0–45.0)
HEMOGLOBIN: 14.1 g/dL (ref 11.7–15.5)
Lymphs Abs: 898 cells/uL (ref 850–3900)
MCH: 31.1 pg (ref 27.0–33.0)
MCHC: 34.2 g/dL (ref 32.0–36.0)
MCV: 90.7 fL (ref 80.0–100.0)
MONOS PCT: 11.1 %
MPV: 10.9 fL (ref 7.5–12.5)
Neutro Abs: 3046 cells/uL (ref 1500–7800)
Neutrophils Relative %: 64.8 %
Platelets: 240 10*3/uL (ref 140–400)
RBC: 4.54 10*6/uL (ref 3.80–5.10)
RDW: 16 % — ABNORMAL HIGH (ref 11.0–15.0)
Total Lymphocyte: 19.1 %
WBC mixed population: 522 cells/uL (ref 200–950)
WBC: 4.7 10*3/uL (ref 3.8–10.8)

## 2017-02-17 LAB — MAGNESIUM: MAGNESIUM: 2 mg/dL (ref 1.5–2.5)

## 2017-02-17 LAB — LIPID PANEL
CHOLESTEROL: 182 mg/dL (ref <200)
HDL: 81 mg/dL (ref >50)
LDL Cholesterol (Calc): 85 mg/dL (calc)
Non-HDL Cholesterol (Calc): 101 mg/dL (calc) (ref <130)
TRIGLYCERIDES: 69 mg/dL (ref <150)
Total CHOL/HDL Ratio: 2.2 (calc) (ref <5.0)

## 2017-02-17 LAB — HEMOGLOBIN A1C
Hgb A1c MFr Bld: 5.2 % of total Hgb (ref <5.7)
Mean Plasma Glucose: 103 (calc)
eAG (mmol/L): 5.7 (calc)

## 2017-02-17 LAB — MICROALBUMIN / CREATININE URINE RATIO
Creatinine, Urine: 80 mg/dL (ref 20–275)
MICROALB UR: 2.2 mg/dL
Microalb Creat Ratio: 28 mcg/mg creat (ref <30)

## 2017-02-17 LAB — VITAMIN B12: Vitamin B-12: 710 pg/mL (ref 200–1100)

## 2017-02-17 LAB — TSH: TSH: 4.88 m[IU]/L — AB (ref 0.40–4.50)

## 2017-02-17 LAB — VITAMIN D 25 HYDROXY (VIT D DEFICIENCY, FRACTURES): VIT D 25 HYDROXY: 23 ng/mL — AB (ref 30–100)

## 2017-02-17 LAB — IRON, TOTAL/TOTAL IRON BINDING CAP
%SAT: 26 % (calc) (ref 11–50)
Iron: 89 ug/dL (ref 45–160)
TIBC: 348 ug/dL (ref 250–450)

## 2017-02-17 LAB — NO CULTURE INDICATED

## 2017-02-24 ENCOUNTER — Ambulatory Visit (INDEPENDENT_AMBULATORY_CARE_PROVIDER_SITE_OTHER): Payer: PPO | Admitting: Adult Health

## 2017-02-24 ENCOUNTER — Encounter: Payer: Self-pay | Admitting: Adult Health

## 2017-02-24 ENCOUNTER — Other Ambulatory Visit: Payer: Self-pay | Admitting: Internal Medicine

## 2017-02-24 VITALS — BP 130/72 | HR 65 | Temp 97.5°F | Ht 64.0 in | Wt 226.4 lb

## 2017-02-24 DIAGNOSIS — G4734 Idiopathic sleep related nonobstructive alveolar hypoventilation: Secondary | ICD-10-CM

## 2017-02-24 DIAGNOSIS — G4733 Obstructive sleep apnea (adult) (pediatric): Secondary | ICD-10-CM

## 2017-02-24 DIAGNOSIS — I10 Essential (primary) hypertension: Secondary | ICD-10-CM

## 2017-02-24 NOTE — Progress Notes (Signed)
Assessment and Plan: Essential hypertension   Refused treatment  Currently managed by lifestyle modification  Patient has specific plans to lose weight  Obstructive sleep apnea  Failed CPAP- tolerates 2L O2 nasal cannula with improved quality of sleep/reduced fatigue  Sleep associated hypoxia  Currently well managed with 2L O2 nasal cannula during sleep   Further disposition pending results of labs. Discussed med's effects and SE's.   Over 30 minutes of exam, counseling, chart review, and critical decision making was performed.   Future Appointments Date Time Provider [place]  05/20/2017 2:30 PM Unk Pinto, MD [facility] None  02/17/2018 10:00 AM Vicie Mutters, PA-C [facility] None    ------------------------------------------------------------------------------------------------------------------   HPI BP 130/72   Pulse 65   Temp (!) 97.5 F (36.4 C)   Ht 5\' 4"  (1.626 m)   Wt 226 lb 6.4 oz (102.7 kg)   SpO2 94%   BMI 38.86 kg/m   76 y.o.female presents for need for recertification of medical need for oxygen, and for 6 min O2 walking test. She was started on oxygen 2-3 years ago after a sleep study which demonstrated low O2 sat (81%)- did not tolerate CPAP, has been on 2L nasal cannula O2 with significantly improved quality of sleep; reports less fatigue, chest pain/pressure while lying down.   SATURATION QUALIFICATIONS:  (This note is used to comply with regulatory documentation for home oxygen)  Patient Oxygen Saturation on Room Air at Rest =     95  %    Pulse     65       RR = 16  Patient Oxygen Saturation on Room Air while Ambulating =   93   %    Pulse    78      RR = 20   Please briefly explain why patient needs home oxygen: a sleep study 2-3 years ago demonstrated low O2 sat (81%)- did not tolerate CPAP, has been on 2L nasal cannula O2 with significantly improved quality of sleep; reports less fatigue, chest pain/pressure while lying  down.   The patient is not indicated for portable O2 for ambulation; utilizes compressed O2 at 2L nasal cannula at home during sleep due to hypoxia associated with sleep.    No complaints today; wanted to discuss weight loss- discussed diet/exercise, provided information regarding good/bad carbs. Patient plan is to quit sweet tea, possibly cut out all sugar for 21 days, start a food diary and consider going back on weight watchers.     Past Medical History:  Diagnosis Date  . Anemia   . Blood transfusion without reported diagnosis   . Cirrhosis ([place])   . Degenerative disk disease   . Diverticulosis   . Hemorrhoids   . Hypertension   . Kidney stones   . Obesity   . Paroxysmal atrial fibrillation ([facility])   . Partial small bowel obstruction ([place])   . Personal history of colonic polyps 03/25/2012   8 mm rectal adenoma 03/2012  . Portal hypertensive gastropathy ([place])   . Shingles   . Thyroid disease   . Varices, esophageal ([place])   . Zoster      Allergies  Allergen Reactions  . Ciprofloxacin     Swelling  . Robaxin [Methocarbamol]     palpiations  . Sulfonamide Derivatives     See little dots, skin feels like its burning  . Verapamil     palpitations    Current Outpatient Prescriptions on File Prior to Visit  Medication Sig  .  Cholecalciferol (VITAMIN D-3) 1000 UNITS CAPS Take 1,000 Units by mouth 2 (two) times daily.   . flecainide (TAMBOCOR) 100 MG tablet Take 1 tablet (100 mg total) by mouth 2 (two) times daily.  Marland Kitchen levothyroxine (SYNTHROID, LEVOTHROID) 112 MCG tablet Take 1 tablet (112 mcg total) by mouth every morning.  . nystatin (MYCOSTATIN/NYSTOP) powder Apply daily after a shower as needed  . nystatin cream (MYCOSTATIN) Apply 1 application topically 2 (two) times daily.   No current facility-administered medications on file prior to visit.     ROS: all negative except above.   Physical Exam:  BP 130/72   Pulse 65   Temp (!) 97.5 F (36.4 C)   Ht 5\' 4"   (1.626 m)   Wt 226 lb 6.4 oz (102.7 kg)   SpO2 94%   BMI 38.86 kg/m   General Appearance: Well nourished, in no apparent distress. Eyes: PERRLA, EOMs, conjunctiva no swelling or erythema Sinuses: No Frontal/maxillary tenderness ENT/Mouth: Ext aud canals clear, TMs without erythema, bulging. Bilateral effusions noted. No erythema, swelling, or exudate on post pharynx.  Tonsils not swollen or erythematous. Hearing normal.  Neck: Supple, thyroid normal.  Respiratory: Respiratory effort normal, BS equal bilaterally without rales, rhonchi, wheezing or stridor.  Cardio: RRR with no MRGs. Brisk peripheral pulses without edema.  Abdomen: Soft, + BS.  Non tender, no guarding, rebound, hernias, masses. Lymphatics: Non tender without lymphadenopathy.  Musculoskeletal: Full ROM, 5/5 strength, normal gait.  Skin: Warm, dry without rashes, lesions, ecchymosis.  Neuro: Cranial nerves intact. Normal muscle tone, no cerebellar symptoms. Sensation intact.  Psych: Awake and oriented X 3, normal affect, Insight and Judgment appropriate.     Izora Ribas, NP 3:30 PM [facility]

## 2017-02-24 NOTE — Patient Instructions (Signed)

## 2017-02-24 NOTE — Assessment & Plan Note (Signed)
    Refused treatment  Currently managed by lifestyle modification  Patient has specific plans to lose weight

## 2017-02-24 NOTE — Assessment & Plan Note (Signed)
   Failed CPAP- tolerates 2L O2 nasal cannula with improved quality of sleep/reduced fatigue

## 2017-02-26 DIAGNOSIS — H524 Presbyopia: Secondary | ICD-10-CM | POA: Diagnosis not present

## 2017-02-26 DIAGNOSIS — H5213 Myopia, bilateral: Secondary | ICD-10-CM | POA: Diagnosis not present

## 2017-03-08 DIAGNOSIS — I10 Essential (primary) hypertension: Secondary | ICD-10-CM | POA: Diagnosis not present

## 2017-03-09 ENCOUNTER — Other Ambulatory Visit: Payer: Self-pay | Admitting: Physician Assistant

## 2017-03-09 ENCOUNTER — Other Ambulatory Visit: Payer: Self-pay

## 2017-03-09 DIAGNOSIS — E039 Hypothyroidism, unspecified: Secondary | ICD-10-CM

## 2017-03-09 MED ORDER — FLECAINIDE ACETATE 100 MG PO TABS: 100.0000 mg | ORAL_TABLET | Freq: Two times a day (BID) | ORAL | 4 refills | Status: DC

## 2017-03-19 ENCOUNTER — Ambulatory Visit: Payer: Self-pay

## 2017-04-07 DIAGNOSIS — I10 Essential (primary) hypertension: Secondary | ICD-10-CM | POA: Diagnosis not present

## 2017-04-11 ENCOUNTER — Emergency Department (HOSPITAL_COMMUNITY): Payer: PPO

## 2017-04-11 ENCOUNTER — Inpatient Hospital Stay (HOSPITAL_COMMUNITY)
Admission: EM | Admit: 2017-04-11 | Discharge: 2017-04-17 | DRG: 871 | Disposition: A | Discharge status: Home Health | Payer: PPO | Attending: Internal Medicine | Admitting: Internal Medicine

## 2017-04-11 ENCOUNTER — Encounter (HOSPITAL_COMMUNITY): Payer: Self-pay | Admitting: Emergency Medicine

## 2017-04-11 DIAGNOSIS — A419 Sepsis, unspecified organism: Secondary | ICD-10-CM | POA: Diagnosis not present

## 2017-04-11 DIAGNOSIS — M7989 Other specified soft tissue disorders: Secondary | ICD-10-CM | POA: Diagnosis not present

## 2017-04-11 DIAGNOSIS — I10 Essential (primary) hypertension: Secondary | ICD-10-CM | POA: Diagnosis present

## 2017-04-11 DIAGNOSIS — Z8249 Family history of ischemic heart disease and other diseases of the circulatory system: Secondary | ICD-10-CM

## 2017-04-11 DIAGNOSIS — I8500 Esophageal varices without bleeding: Secondary | ICD-10-CM | POA: Diagnosis not present

## 2017-04-11 DIAGNOSIS — I472 Ventricular tachycardia, unspecified: Secondary | ICD-10-CM

## 2017-04-11 DIAGNOSIS — R7303 Prediabetes: Secondary | ICD-10-CM | POA: Diagnosis not present

## 2017-04-11 DIAGNOSIS — N12 Tubulo-interstitial nephritis, not specified as acute or chronic: Secondary | ICD-10-CM

## 2017-04-11 DIAGNOSIS — R0682 Tachypnea, not elsewhere classified: Secondary | ICD-10-CM | POA: Diagnosis not present

## 2017-04-11 DIAGNOSIS — D72819 Decreased white blood cell count, unspecified: Secondary | ICD-10-CM | POA: Diagnosis not present

## 2017-04-11 DIAGNOSIS — I420 Dilated cardiomyopathy: Secondary | ICD-10-CM

## 2017-04-11 DIAGNOSIS — J9601 Acute respiratory failure with hypoxia: Secondary | ICD-10-CM | POA: Diagnosis present

## 2017-04-11 DIAGNOSIS — J9621 Acute and chronic respiratory failure with hypoxia: Secondary | ICD-10-CM | POA: Diagnosis not present

## 2017-04-11 DIAGNOSIS — N179 Acute kidney failure, unspecified: Secondary | ICD-10-CM | POA: Diagnosis present

## 2017-04-11 DIAGNOSIS — I5043 Acute on chronic combined systolic (congestive) and diastolic (congestive) heart failure: Secondary | ICD-10-CM | POA: Diagnosis not present

## 2017-04-11 DIAGNOSIS — Z87442 Personal history of urinary calculi: Secondary | ICD-10-CM | POA: Diagnosis not present

## 2017-04-11 DIAGNOSIS — I959 Hypotension, unspecified: Secondary | ICD-10-CM | POA: Diagnosis not present

## 2017-04-11 DIAGNOSIS — R0902 Hypoxemia: Secondary | ICD-10-CM

## 2017-04-11 DIAGNOSIS — R740 Nonspecific elevation of levels of transaminase and lactic acid dehydrogenase [LDH]: Secondary | ICD-10-CM | POA: Diagnosis not present

## 2017-04-11 DIAGNOSIS — Z66 Do not resuscitate: Secondary | ICD-10-CM | POA: Diagnosis present

## 2017-04-11 DIAGNOSIS — I5031 Acute diastolic (congestive) heart failure: Secondary | ICD-10-CM | POA: Diagnosis not present

## 2017-04-11 DIAGNOSIS — Z79899 Other long term (current) drug therapy: Secondary | ICD-10-CM

## 2017-04-11 DIAGNOSIS — K7460 Unspecified cirrhosis of liver: Secondary | ICD-10-CM | POA: Diagnosis not present

## 2017-04-11 DIAGNOSIS — Z888 Allergy status to other drugs, medicaments and biological substances status: Secondary | ICD-10-CM | POA: Diagnosis not present

## 2017-04-11 DIAGNOSIS — N390 Urinary tract infection, site not specified: Secondary | ICD-10-CM | POA: Diagnosis not present

## 2017-04-11 DIAGNOSIS — J189 Pneumonia, unspecified organism: Secondary | ICD-10-CM | POA: Diagnosis present

## 2017-04-11 DIAGNOSIS — B9620 Unspecified Escherichia coli [E. coli] as the cause of diseases classified elsewhere: Secondary | ICD-10-CM | POA: Diagnosis present

## 2017-04-11 DIAGNOSIS — I4891 Unspecified atrial fibrillation: Secondary | ICD-10-CM | POA: Diagnosis present

## 2017-04-11 DIAGNOSIS — Z5309 Procedure and treatment not carried out because of other contraindication: Secondary | ICD-10-CM

## 2017-04-11 DIAGNOSIS — I272 Pulmonary hypertension, unspecified: Secondary | ICD-10-CM | POA: Diagnosis not present

## 2017-04-11 DIAGNOSIS — E8770 Fluid overload, unspecified: Secondary | ICD-10-CM | POA: Diagnosis not present

## 2017-04-11 DIAGNOSIS — G4733 Obstructive sleep apnea (adult) (pediatric): Secondary | ICD-10-CM | POA: Diagnosis present

## 2017-04-11 DIAGNOSIS — E039 Hypothyroidism, unspecified: Secondary | ICD-10-CM | POA: Diagnosis present

## 2017-04-11 DIAGNOSIS — Z9981 Dependence on supplemental oxygen: Secondary | ICD-10-CM

## 2017-04-11 DIAGNOSIS — J4520 Mild intermittent asthma, uncomplicated: Secondary | ICD-10-CM | POA: Diagnosis not present

## 2017-04-11 DIAGNOSIS — N3080 Other cystitis without hematuria: Secondary | ICD-10-CM | POA: Diagnosis present

## 2017-04-11 DIAGNOSIS — R7401 Elevation of levels of liver transaminase levels: Secondary | ICD-10-CM | POA: Diagnosis present

## 2017-04-11 DIAGNOSIS — I110 Hypertensive heart disease with heart failure: Secondary | ICD-10-CM | POA: Diagnosis present

## 2017-04-11 DIAGNOSIS — I5042 Chronic combined systolic (congestive) and diastolic (congestive) heart failure: Secondary | ICD-10-CM

## 2017-04-11 DIAGNOSIS — R188 Other ascites: Secondary | ICD-10-CM | POA: Diagnosis not present

## 2017-04-11 DIAGNOSIS — Z86010 Personal history of colonic polyps: Secondary | ICD-10-CM | POA: Diagnosis not present

## 2017-04-11 DIAGNOSIS — J4521 Mild intermittent asthma with (acute) exacerbation: Secondary | ICD-10-CM

## 2017-04-11 DIAGNOSIS — K7469 Other cirrhosis of liver: Secondary | ICD-10-CM | POA: Diagnosis not present

## 2017-04-11 DIAGNOSIS — R0600 Dyspnea, unspecified: Secondary | ICD-10-CM | POA: Diagnosis not present

## 2017-04-11 DIAGNOSIS — I480 Paroxysmal atrial fibrillation: Secondary | ICD-10-CM | POA: Diagnosis not present

## 2017-04-11 DIAGNOSIS — Z882 Allergy status to sulfonamides status: Secondary | ICD-10-CM

## 2017-04-11 DIAGNOSIS — I340 Nonrheumatic mitral (valve) insufficiency: Secondary | ICD-10-CM | POA: Diagnosis not present

## 2017-04-11 DIAGNOSIS — J181 Lobar pneumonia, unspecified organism: Secondary | ICD-10-CM | POA: Diagnosis not present

## 2017-04-11 DIAGNOSIS — I509 Heart failure, unspecified: Secondary | ICD-10-CM | POA: Diagnosis not present

## 2017-04-11 DIAGNOSIS — R918 Other nonspecific abnormal finding of lung field: Secondary | ICD-10-CM | POA: Diagnosis not present

## 2017-04-11 DIAGNOSIS — A4151 Sepsis due to Escherichia coli [E. coli]: Secondary | ICD-10-CM | POA: Diagnosis not present

## 2017-04-11 LAB — URINALYSIS, ROUTINE W REFLEX MICROSCOPIC
BILIRUBIN URINE: NEGATIVE
Glucose, UA: NEGATIVE mg/dL
Ketones, ur: NEGATIVE mg/dL
NITRITE: NEGATIVE
PH: 6 (ref 5.0–8.0)
Protein, ur: 100 mg/dL — AB
SPECIFIC GRAVITY, URINE: 1.011 (ref 1.005–1.030)

## 2017-04-11 LAB — COMPREHENSIVE METABOLIC PANEL
ALBUMIN: 2.9 g/dL — AB (ref 3.5–5.0)
ALK PHOS: 106 U/L (ref 38–126)
ALT: 32 U/L (ref 14–54)
AST: 82 U/L — ABNORMAL HIGH (ref 15–41)
Anion gap: 8 (ref 5–15)
BILIRUBIN TOTAL: 2.2 mg/dL — AB (ref 0.3–1.2)
BUN: 15 mg/dL (ref 6–20)
CHLORIDE: 110 mmol/L (ref 101–111)
CO2: 22 mmol/L (ref 22–32)
Calcium: 9.4 mg/dL (ref 8.9–10.3)
Creatinine, Ser: 0.79 mg/dL (ref 0.44–1.00)
GFR calc Af Amer: >60 mL/min (ref >60)
GFR calc non Af Amer: >60 mL/min (ref >60)
Glucose, Bld: 113 mg/dL — ABNORMAL HIGH (ref 65–99)
POTASSIUM: 4.7 mmol/L (ref 3.5–5.1)
Sodium: 140 mmol/L (ref 135–145)
Total Protein: 6.8 g/dL (ref 6.5–8.1)

## 2017-04-11 LAB — CBC WITH DIFFERENTIAL/PLATELET
BASOS ABS: 0 10*3/uL (ref 0.0–0.1)
Basophils Relative: 0 %
EOS PCT: 1 %
Eosinophils Absolute: 0 10*3/uL (ref 0.0–0.7)
HCT: 43.6 % (ref 36.0–46.0)
Hemoglobin: 14.5 g/dL (ref 12.0–15.0)
LYMPHS ABS: 0.3 10*3/uL — AB (ref 0.7–4.0)
LYMPHS PCT: 9 %
MCH: 31.6 pg (ref 26.0–34.0)
MCHC: 33.3 g/dL (ref 30.0–36.0)
MCV: 95 fL (ref 78.0–100.0)
Monocytes Absolute: 0.1 10*3/uL (ref 0.1–1.0)
Monocytes Relative: 2 %
NEUTROS ABS: 2.5 10*3/uL (ref 1.7–7.7)
Neutrophils Relative %: 88 %
Platelets: 204 10*3/uL (ref 150–400)
RBC: 4.59 MIL/uL (ref 3.87–5.11)
RDW: 18.2 % — AB (ref 11.5–15.5)
WBC: 2.9 10*3/uL — AB (ref 4.0–10.5)

## 2017-04-11 MED ORDER — ONDANSETRON HCL 4 MG/2ML IJ SOLN
4.0000 mg | Freq: Once | INTRAMUSCULAR | Status: AC
  Administered 2017-04-11: 4 mg via INTRAVENOUS
  Filled 2017-04-11: qty 2

## 2017-04-11 MED ORDER — IOPAMIDOL (ISOVUE-370) INJECTION 76%
INTRAVENOUS | Status: AC
  Filled 2017-04-11: qty 100

## 2017-04-11 MED ORDER — HYDROMORPHONE HCL 1 MG/ML IJ SOLN
0.5000 mg | Freq: Once | INTRAMUSCULAR | Status: AC
  Administered 2017-04-11: 0.5 mg via INTRAVENOUS
  Filled 2017-04-11: qty 1

## 2017-04-11 MED ORDER — CEFTRIAXONE SODIUM 1 G IJ SOLR
1.0000 g | Freq: Once | INTRAMUSCULAR | Status: AC
  Administered 2017-04-11: 1 g via INTRAVENOUS
  Filled 2017-04-11: qty 10

## 2017-04-11 MED ORDER — DEXTROSE 5 % IV SOLN
500.0000 mg | Freq: Once | INTRAVENOUS | Status: AC
  Administered 2017-04-11: 500 mg via INTRAVENOUS
  Filled 2017-04-11: qty 500

## 2017-04-11 MED ORDER — ALBUTEROL SULFATE (2.5 MG/3ML) 0.083% IN NEBU: 2.5000 mg | INHALATION_SOLUTION | RESPIRATORY_TRACT | Status: DC | PRN

## 2017-04-11 MED ORDER — SODIUM CHLORIDE 0.9 % IV BOLUS (SEPSIS)
500.0000 mL | Freq: Once | INTRAVENOUS | Status: AC
  Administered 2017-04-11: 500 mL via INTRAVENOUS

## 2017-04-11 MED ORDER — IPRATROPIUM-ALBUTEROL 0.5-2.5 (3) MG/3ML IN SOLN
3.0000 mL | Freq: Four times a day (QID) | RESPIRATORY_TRACT | Status: DC
  Administered 2017-04-12 (×4): 3 mL via RESPIRATORY_TRACT
  Filled 2017-04-11 (×4): qty 3

## 2017-04-11 MED ORDER — IOPAMIDOL (ISOVUE-370) INJECTION 76%
100.0000 mL | Freq: Once | INTRAVENOUS | Status: AC | PRN
  Administered 2017-04-11: 100 mL via INTRAVENOUS

## 2017-04-11 NOTE — ED Notes (Signed)
Female External catheter has been applied

## 2017-04-11 NOTE — ED Triage Notes (Addendum)
Patient here from home with complaints of right sided flank pain radiating around into abdomen. Pain 10/10. Nausea/dry heaving. Denies urinary symptoms.

## 2017-04-11 NOTE — ED Notes (Signed)
Pt currently in CT.

## 2017-04-11 NOTE — H&P (Signed)
Brittney Valentine BJS:[id number] DOB: 08-24-1940 DOA: 04/11/2017     PCP: Unk Pinto, MD   Outpatient Specialists:Gessner, Ofilia Neas, MD as Consulting Physician (Gastroenterology) Deterding, Jeneen Rinks, MD as Consulting Physician (Nephrology) Jettie Booze, MD as Consulting Physician (Cardiology)  Patient coming from:    home Lives alone,    Chief Complaint: Flank pain  HPI: Brittney Valentine is a 76 y.o. female with medical history significant of cirrhosis with portal hypertension and esophageal varices, paroxysmal atrial fibrillation not on anticoagulation treated with flecanide, HTN, OSA, hypothyroidism, HLD, chronic anemia and prediabetes    Presented with right-sided flank pain radiating around the abdomen states 10 out of 10 associated some nausea no vomiting only dry heaves. No dysuria. Denies shortness of breath or chest pain although per nursing staff appear to have increased work of breathing in the ER had low-grade fever up to 100.1 and she given her increased respirations and tachycardic Patient is know history of nephrotic that this was similar to her prior pain CT stone study did show evidence of recent ureteral stone passing versus non-radiopaque stone of the right ureter emergency department her pain was controlled given persistent tachypnea and noted hypoxia with minimal ambulation her oxygen saturation drifted into the mid 80s with chest x-ray was obtained and showed possible infiltrate worrisome for pneumonia  She reports having some wheezing for thepast few days never smokes.  Regarding pertinent Chronic problems: Patient does not tolerate beta blockers and has refused treatment in the past for hypertension currently treated to last him medications has known history of sleep apnea but failed CPAP instead on 2 L of nasal cannula while sleeping   IN ER:  Temp (24hrs), Avg:99.2 F (37.3 C), Min:98.5 F (36.9 C), Max:100.1 F (37.8 C)      on arrival  ED Triage Vitals   Enc Vitals Group     BP 04/11/17 1412 (!) 200/85     Pulse Rate 04/11/17 1412 68     Resp 04/11/17 1412 18     Temp 04/11/17 1412 98.5 F (36.9 C)     Temp Source 04/11/17 1412 Oral     SpO2 04/11/17 1412 100 %     Weight 04/11/17 1629 226 lb (102.5 kg)     Height 04/11/17 1629 5\' 4"  (1.626 m)     Head Circumference --      Peak Flow --      Pain Score 04/11/17 1410 10     Pain Loc --      Pain Edu? --      Excl. in [place]? --     Latest RR currently down to 15,  satting 90% on 2 L pulse 82 BP 124/63 Na 140 BUN 15 Alb 2.9 AST 82 Tbili 2.2 WBC 2.9 down from baseline of 4.7 in September Hg 14.5 CXR worrisome for left basilar opacity CT renal protocol showing advanced cirrhosis mild dilatation of right interatrial collecting system and proximal to mid ureter right ureter no evidence of ureteral stone Following Medications were ordered in ER: Medications  azithromycin (ZITHROMAX) 500 mg in dextrose 5 % 250 mL IVPB (500 mg Intravenous New Bag/Given 04/11/17 2206)  ondansetron (ZOFRAN) injection 4 mg (4 mg Intravenous Given 04/11/17 1548)  HYDROmorphone (DILAUDID) injection 0.5 mg (0.5 mg Intravenous Given 04/11/17 1529)  sodium chloride 0.9 % bolus 500 mL (0 mLs Intravenous Stopped 04/11/17 1713)  sodium chloride 0.9 % bolus 500 mL (0 mLs Intravenous Stopped 04/11/17 1714)  cefTRIAXone (ROCEPHIN) 1  g in dextrose 5 % 50 mL IVPB (0 g Intravenous Stopped 04/11/17 2138)      Hospitalist was called for admission for possible community-acquired pneumonia resulting in sepsis with acute respiratory failure with hypoxia  Review of Systems:    Pertinent positives include:  Fevers, chills, fatigue  Constitutional:  No weight loss, night sweats, weight loss  HEENT:  No headaches, Difficulty swallowing,Tooth/dental problems,Sore throat,  No sneezing, itching, ear ache, nasal congestion, post nasal drip,  Cardio-vascular:  No chest pain, Orthopnea, PND, anasarca, dizziness, palpitations.no  Bilateral lower extremity swelling  GI:  No heartburn, indigestion, abdominal pain, nausea, vomiting, diarrhea, change in bowel habits, loss of appetite, melena, blood in stool, hematemesis Resp:  no shortness of breath at rest. No dyspnea on exertion, No excess mucus, no productive cough, No non-productive cough, No coughing up of blood.No change in color of mucus.No wheezing. Skin:  no rash or lesions. No jaundice GU:  no dysuria, change in color of urine, no urgency or frequency. No straining to urinate.  No flank pain.  Musculoskeletal:  No joint pain or no joint swelling. No decreased range of motion. No back pain.  Psych:  No change in mood or affect. No depression or anxiety. No memory loss.  Neuro: no localizing neurological complaints, no tingling, no weakness, no double vision, no gait abnormality, no slurred speech, no confusion  As per HPI otherwise 10 point review of systems negative.   Past Medical History: Past Medical History:  Diagnosis Date  . Anemia   . Blood transfusion without reported diagnosis   . Cirrhosis ([place])   . Degenerative disk disease   . Diverticulosis   . Hemorrhoids   . Hypertension   . Kidney stones   . Obesity   . Paroxysmal atrial fibrillation (HCC)   . Partial small bowel obstruction ([place])   . Personal history of colonic polyps 03/25/2012   8 mm rectal adenoma 03/2012  . Portal hypertensive gastropathy ([place])   . Shingles   . Thyroid disease   . Varices, esophageal ([place])   . Zoster    Past Surgical History:  Procedure Laterality Date  . ABDOMINAL HYSTERECTOMY    . APPENDECTOMY  1991  . CHOLECYSTECTOMY  2000  . COLON SURGERY    . COLON SURGERY    . COLONOSCOPY    . ESOPHAGOGASTRODUODENOSCOPY  multiple     Social History:  Ambulatory  independently     reports that she has never smoked. She has never used smokeless tobacco. She reports that she does not drink alcohol or use drugs.  Allergies:   Allergies  Allergen  Reactions  . Ciprofloxacin     Swelling  . Robaxin [Methocarbamol]     palpiations  . Sulfonamide Derivatives     See little dots, skin feels like its burning  . Verapamil     palpitations       Family History:   Family History  Problem Relation Age of Onset  . Heart failure Mother        died from  . Hypertension Mother   . Heart attack Father        died from  . Hypertension Sister     Medications: Prior to Admission medications   Medication Sig Start Date End Date Taking? Authorizing Provider  Cholecalciferol (VITAMIN D-3) 1000 UNITS CAPS Take 1,000 Units by mouth 2 (two) times daily.    Yes [provider]  flecainide (TAMBOCOR) 100 MG tablet Take 1 tablet (100  mg total) by mouth 2 (two) times daily. 03/09/17  Yes Vicie Mutters, PA-C  levothyroxine (SYNTHROID, LEVOTHROID) 112 MCG tablet TAKE 1 TABLET BY MOUTH ONCE DAILY IN THE MORNING 03/09/17  Yes Vicie Mutters, PA-C  MAGNESIUM PO Take 1 tablet by mouth daily.   Yes [provider]  Omega-3 Fatty Acids (FISH OIL PO) Take 1 tablet by mouth daily.   Yes [provider]  nystatin (MYCOSTATIN/NYSTOP) powder Apply daily after a shower as needed Patient not taking: Reported on 04/11/2017 02/16/17   Vicie Mutters, PA-C  nystatin cream (MYCOSTATIN) Apply 1 application topically 2 (two) times daily. Patient not taking: Reported on 04/11/2017 02/16/17   Vicie Mutters, PA-C    Physical Exam: Patient Vitals for the past 24 hrs:  BP Temp Temp src Pulse Resp SpO2 Height Weight  04/11/17 2030 124/63 - - 82 15 90 % - -  04/11/17 2022 (!) 133/49 98.9 F (37.2 C) Oral 87 (!) 22 92 % - -  04/11/17 1930 (!) 104/48 - - 98 17 90 % - -  04/11/17 1824 (!) 124/57 99.3 F (37.4 C) Oral (!) 107 (!) 23 92 % - -  04/11/17 1700 (!) 148/62 - - (!) 105 20 94 % - -  04/11/17 1630 (!) 128/54 - - (!) 104 (!) 22 94 % - -  04/11/17 1629 - - - - - - 5\' 4"  (1.626 m) 102.5 kg (226 lb)  04/11/17 1628 (!) 153/55 - - (!) 105  (!) 24 93 % - -  04/11/17 1551 (!) 155/103 100.1 F (37.8 C) Oral 96 (!) 32 91 % - -  04/11/17 1412 (!) 200/85 98.5 F (36.9 C) Oral 68 18 100 % - -    1. General:  in No Acute distress   Chronically ill  -appearing 2. Psychological: slightly somnolent and   Oriented 3. Head/ENT:     Dry Mucous Membranes                          Head Non traumatic, neck supple                      Poor Dentition 4. SKIN:   decreased Skin turgor,  Skin clean Dry and intact no rash 5. Heart: Regular rate and rhythm no  Murmur, no Rub or gallop 6. Lungs:  Significant wheezes on the right no crackles   7. Abdomen: Soft,  non-tender, Non distended  obese   8. Lower extremities: no clubbing, cyanosis, or edema 9. Neurologically Grossly intact, moving all 4 extremities equally   10. MSK: Normal range of motion   body mass index is 38.79 kg/m.  Labs on Admission:   Labs on Admission: I have personally reviewed following labs and imaging studies  CBC:  Recent Labs Lab 04/11/17 1522  WBC 2.9*  NEUTROABS 2.5  HGB 14.5  HCT 43.6  MCV 95.0  PLT 035   Basic Metabolic Panel:  Recent Labs Lab 04/11/17 1522  NA 140  K 4.7  CL 110  CO2 22  GLUCOSE 113*  BUN 15  CREATININE 0.79  CALCIUM 9.4   GFR: Estimated Creatinine Clearance: 73 mL/min (by C-G formula based on SCr of 0.79 mg/dL). Liver Function Tests:  Recent Labs Lab 04/11/17 1522  AST 82*  ALT 32  ALKPHOS 106  BILITOT 2.2*  PROT 6.8  ALBUMIN 2.9*   No results for input(s): LIPASE, AMYLASE in the last 168  hours. No results for input(s): AMMONIA in the last 168 hours. Coagulation Profile: No results for input(s): INR, PROTIME in the last 168 hours. Cardiac Enzymes: No results for input(s): CKTOTAL, CKMB, CKMBINDEX, TROPONINI in the last 168 hours. BNP (last 3 results) No results for input(s): PROBNP in the last 8760 hours. HbA1C: No results for input(s): HGBA1C in the last 72 hours. CBG: No results for input(s): GLUCAP  in the last 168 hours. Lipid Profile: No results for input(s): CHOL, HDL, LDLCALC, TRIG, CHOLHDL, LDLDIRECT in the last 72 hours. Thyroid Function Tests: No results for input(s): TSH, T4TOTAL, FREET4, T3FREE, THYROIDAB in the last 72 hours. Anemia Panel: No results for input(s): VITAMINB12, FOLATE, FERRITIN, TIBC, IRON, RETICCTPCT in the last 72 hours. Urine analysis:    Component Value Date/Time   COLORURINE AMBER (A) 04/11/2017 1413   APPEARANCEUR CLOUDY (A) 04/11/2017 1413   LABSPEC 1.011 04/11/2017 1413   PHURINE 6.0 04/11/2017 1413   GLUCOSEU NEGATIVE 04/11/2017 1413   HGBUR LARGE (A) 04/11/2017 1413   BILIRUBINUR NEGATIVE 04/11/2017 1413   KETONESUR NEGATIVE 04/11/2017 1413   PROTEINUR 100 (A) 04/11/2017 1413   UROBILINOGEN 1 12/21/2014 1004   NITRITE NEGATIVE 04/11/2017 1413   LEUKOCYTESUR LARGE (A) 04/11/2017 1413   Sepsis Labs: @LABRCNTIP (procalcitonin:4,lacticidven:4) )No results found for this or any previous visit (from the past 240 hour(s)).     UA   evidence of UTI      Lab Results  Component Value Date   HGBA1C 5.2 02/16/2017    Estimated Creatinine Clearance: 73 mL/min (by C-G formula based on SCr of 0.79 mg/dL).  BNP (last 3 results) No results for input(s): PROBNP in the last 8760 hours.   ECG REPORT Not obtained  Filed Weights   04/11/17 1629  Weight: 102.5 kg (226 lb)     Cultures:    Component Value Date/Time   SDES URINE, RANDOM 12/08/2007 1701   SPECREQUEST NONE 12/08/2007 1701   CULT  12/08/2007 1701    Multiple bacterial morphotypes present, none predominant. Suggest appropriate recollection if clinically indicated.   REPTSTATUS 12/10/2007 FINAL 12/08/2007 1701     Radiological Exams on Admission: Dg Chest 2 View  Result Date: 04/11/2017 CLINICAL DATA:  76 year old female with right-sided flank pain radiating to abdomen. EXAM: CHEST  2 VIEW COMPARISON:  10/01/2014 FINDINGS: Cardiomediastinal silhouette is slightly enlarged. Note  is made of low lung volumes with vascular congestion. Increased opacity at the left lung base concerning for focal infiltrate. No significant pleural effusions or pneumothorax. No acute osseous abnormalities. IMPRESSION: Markedly low lung volumes with left basilar opacity concerning for focal infiltrate. Follow-up to resolution recommended. Electronically Signed   By: Kristopher Oppenheim M.D.   On: 04/11/2017 17:41   Ct Angio Chest Pe W Or Wo Contrast  Result Date: 04/11/2017 CLINICAL DATA:  Shortness of breath. Tachypnea and oxygen desaturation when ambulating. EXAM: CT ANGIOGRAPHY CHEST WITH CONTRAST TECHNIQUE: Multidetector CT imaging of the chest was performed using the standard protocol during bolus administration of intravenous contrast. Multiplanar CT image reconstructions and MIPs were obtained to evaluate the vascular anatomy. CONTRAST:  100 cc Isovue 370 IV COMPARISON:  Chest radiograph earlier this day. Abdominal CT earlier this day. FINDINGS: Cardiovascular: There are no filling defects within the pulmonary arteries to the segmental level suggest pulmonary embolus. Cannot assess subsegmental branches due to contrast bolus timing and soft tissue attenuation from habitus. Mild multi chamber cardiomegaly. Atherosclerosis of the thoracic aorta. No evidence of dissection. No pericardial fluid. Mediastinum/Nodes: Small hiatal  hernia with large paraesophageal varices. No enlarged mediastinal or hilar nodes, multiple small mediastinal nodes are seen. Visualized thyroid gland is diminutive. Lungs/Pleura: Subsegmental atelectasis in the lingula, right middle lobe and both lower lobes, greater on the left. No confluent consolidation. Linear subpleural opacities favoring atelectasis over septal thickening. No pleural fluid. Upper Abdomen: Characterized on abdominal CT today. Cirrhosis and splenomegaly. Small amount of ascites. Perigastric and paraesophageal varices. Musculoskeletal: There are no acute or suspicious  osseous abnormalities. Review of the MIP images confirms the above findings. IMPRESSION: 1. No central pulmonary embolus. 2. Mild cardiomegaly.  Aortic Atherosclerosis (ICD10-I70.0). 3. Multifocal atelectasis, greater in the left lung. No pleural fluid. 4. Cirrhosis, splenomegaly, paraesophageal and perigastric varices and small amount of abdominal ascites. Electronically Signed   By: Jeb Levering M.D.   On: 04/11/2017 23:38   Ct Renal Stone Study  Result Date: 04/11/2017 CLINICAL DATA:  complaints of right sided flank pain radiating around into abdomen. Pain 10/10. Nausea/dry heaving. Denies urinary symptoms. Patient was vomiting yellow bile while she was on CT table. EXAM: CT ABDOMEN AND PELVIS WITHOUT CONTRAST TECHNIQUE: Multidetector CT imaging of the abdomen and pelvis was performed following the standard protocol without IV contrast. COMPARISON:  10/02/2014 FINDINGS: Lower chest: No acute abnormality. Hepatobiliary: Small nodular liver consistent with advanced cirrhosis. Small cyst at the dome of the right lobe. No other liver mass or focal lesion. Status post cholecystectomy. No bile duct dilation. Pancreas: Several calcifications along the inferior margin of the pancreatic head. No pancreatic mass or inflammation. Spleen: Enlarged measuring 19 x 8 x 14 cm. No splenic mass or focal lesion. Adrenals/Urinary Tract: No adrenal masses. Mild dilation of the right intrarenal collecting system. Portions of the proximal right ureter mildly dilated. No right ureteral stone. No right renal mass or intrarenal stone. No left renal mass. No left hydronephrosis. Small nonobstructing stone in the upper pole. Normal left ureter. Bladder is unremarkable. Stomach/Bowel: Status post right partial colectomy. Anastomosis staple line is noted in the right mid to lower abdomen. No colonic wall thickening. There are scattered diverticular the left colon without diverticulitis. Stomach is unremarkable. No small bowel  obstruction or convincing inflammation. Small bowel anastomosis staple line noted in the right mid to lower abdomen. Vascular/Lymphatic: There are numerous vascular collaterals in the upper abdomen reflecting portal venous hypertension. These include paraesophageal collaterals, stable when compared to the prior CT. Mild aortic atherosclerosis. There scattered prominent gastrohepatic and retroperitoneal lymph nodes none of which are pathologically enlarged by size criteria. Reproductive: Status post hysterectomy. No adnexal masses. Other: Small amount of ascites collects adjacent to the liver and spleen and between the leaves of the small bowel mesenteric. No abdominal wall hernia. Musculoskeletal: No fracture or acute finding. No osteoblastic or osteolytic lesions. IMPRESSION: 1. There is mild dilation of the right intrarenal collecting system and portions of the proximal to mid right ureter, but no evidence of a ureteral stone. The possibility of a non radiopaque ureteral stone or recent passage of a stone should be considered given the presenting history. 2. No other evidence of an acute abnormality. 3. There changes of advanced cirrhosis with portal venous hypertension reflected by extensive varices, splenomegaly and a small amount of ascites. Ascites has increased in amount from the prior CT. Other findings are stable. 4. Stable changes from bowel surgery. 5. Mild aortic atherosclerosis. Electronically Signed   By: Lajean Manes M.D.   On: 04/11/2017 16:05    Chart has been reviewed    Assessment/Plan  76 y.o. female with medical history significant of cirrhosis with portal hypertension and esophageal varices, paroxysmal atrial fibrillation not on anticoagulation treated with flecanide, HTN, OSA, hypothyroidism, HLD, chronic anemia and prediabetes  Admitted for  possible community-acquired pneumonia resulting in sepsis with acute respiratory failure with hypoxia   Present on Admission: . RAD  (reactive airway disease), mild intermittent, with acute exacerbation  -  - Will initiate: Steroid taper  -  Antibiotics Rocephin and azithromycin - Albuterol PRN, - scheduled duoneb,  -  Breo or Dulera at discharge   -  Mucinex.  Titrate O2 to saturation >90%. Follow patients respiratory status.  Order respiratory panel and influenza PCR Obtain ABG given some somnolence No evidence of hypercarbia -   PCCM consulted for e-link monitoring,     . Atrial fibrillation ([place])           - CHA2DS2 vas score  3:   Not on anticoagulation secondary to Risk of bleeding         -  Rate control:  Currently rate controled not on any agent          - Rhythm control:  Continue flecainide and obtain EKG  . Essential hypertension stable not on any home medications continue to monitor . Hepatic cirrhosis (HCC) chronic currently appears to be stable patient notices history of known varices but currently no evidence of GI bleeding  . Acute respiratory failure with hypoxia (HCC) likely separate coronary to reactive airway disease CT showed no evidence of PE no evidence of pneumonia. . Hypothyroidism stable continue home medications checked his age . Prediabetes  - Order  Moderate SSI   -  check TSH and HgA1C   . Acute lower UTI treated with Rocephin await results of urine culture  . Obstructive sleep apnea she does not tolerate CPAP in the past. She is on 2 L of oxygen at nighttime will need to restart    . Leukopenia unclear etiology possibly secondary to viral illness. If persists will need to further evaluation    Other plan as per orders.  DVT prophylaxis:  SCD    Code Status:   DNR/DNI   as per patient    Family Communication:   Family not at  Bedside    Disposition Plan:     To home once workup is complete and patient is stable   Would benefit from PT/OT eval prior to DC   ordered                     Consults called: E-link monitored by [facility]    Admission status:   inpatient        Level of care    stepdown increased work of breathing          I have spent a total of 56 min on this admission    Dawaun Brancato 04/12/2017, 12:50 AM    [facility]  Pager [phone]   after 2 AM please page floor coverage PA If 7AM-7PM, please contact the day team taking care of the patient  [email or web address]  Password [email or web address]

## 2017-04-11 NOTE — ED Notes (Signed)
RN may call report to [facility] [phone] at 00:20

## 2017-04-11 NOTE — ED Notes (Signed)
ED Provider at bedside. 

## 2017-04-11 NOTE — ED Provider Notes (Signed)
[place] DEPT Provider Note   CSN: [id number] Arrival date & time: 04/11/17  1401     History   Chief Complaint Chief Complaint  Patient presents with  . Flank Pain    HPI Brittney Valentine is a 76 y.o. female w PMHx PAF, nephrolithiasis, HTN, portal HTN, esophageal varices, presenting to ED with acute onset of right flank pain that began suddenly around noon.  Patient states the pain as sharp and constant, radiating around her right flank into her right abdomen.  Reports associated nausea and vomiting.  No medications tried for pain.  She states it is been so long since she had kidney stones, that she does not remember how it feels.  Denies urinary symptoms, shortness of breath, chest pain, fever, chills, or any other symptoms.  Abdominal surgeries include cholecystectomy, appendectomy, abdominal hysterectomy.   The history is provided by the patient.    Past Medical History:  Diagnosis Date  . Anemia   . Blood transfusion without reported diagnosis   . Cirrhosis ([place])   . Degenerative disk disease   . Diverticulosis   . Hemorrhoids   . Hypertension   . Kidney stones   . Obesity   . Paroxysmal atrial fibrillation ([facility])   . Partial small bowel obstruction ([place])   . Personal history of colonic polyps 03/25/2012   8 mm rectal adenoma 03/2012  . Portal hypertensive gastropathy ([place])   . Shingles   . Thyroid disease   . Varices, esophageal ([place])   . Zoster     Patient Active Problem List   Diagnosis Date Noted  . Acute lower UTI 04/11/2017  . Acute respiratory failure with hypoxia ([place]) 04/11/2017  . CAP (community acquired pneumonia) 04/11/2017  . Obstructive sleep apnea 01/08/2015  . Essential hypertension 12/21/2014  . Prediabetes 12/21/2014  . Hypothyroidism 12/21/2014  . Encounter for Medicare annual wellness exam 12/20/2014  . Medication management 09/12/2014  . Vitamin D deficiency 09/12/2014  . Atrial fibrillation ([place])  05/11/2014  . Esophageal varices ([place]) 02/09/2014  . Left sided sciatica 02/09/2014  . History of colonic polyps 03/25/2012  . Obesity 09/27/2009  . History of small bowel obstruction 12/31/2007  . Hepatic cirrhosis ([place]) 12/31/2007  . PORTAL HYPERTENSION 12/31/2007    Past Surgical History:  Procedure Laterality Date  . ABDOMINAL HYSTERECTOMY    . APPENDECTOMY  1991  . CHOLECYSTECTOMY  2000  . COLON SURGERY    . COLON SURGERY    . COLONOSCOPY    . ESOPHAGOGASTRODUODENOSCOPY  multiple    OB History    No data available       Home Medications    Prior to Admission medications   Medication Sig Start Date End Date Taking? Authorizing Provider  Cholecalciferol (VITAMIN D-3) 1000 UNITS CAPS Take 1,000 Units by mouth 2 (two) times daily.    Yes [provider]  flecainide (TAMBOCOR) 100 MG tablet Take 1 tablet (100 mg total) by mouth 2 (two) times daily. 03/09/17  Yes Vicie Mutters, PA-C  levothyroxine (SYNTHROID, LEVOTHROID) 112 MCG tablet TAKE 1 TABLET BY MOUTH ONCE DAILY IN THE MORNING 03/09/17  Yes Vicie Mutters, PA-C  MAGNESIUM PO Take 1 tablet by mouth daily.   Yes [provider]  Omega-3 Fatty Acids (FISH OIL PO) Take 1 tablet by mouth daily.   Yes [provider]  nystatin (MYCOSTATIN/NYSTOP) powder Apply daily after a shower as needed Patient not taking: Reported on 04/11/2017 02/16/17   Vicie Mutters, PA-C  nystatin cream (MYCOSTATIN) Apply 1 application topically 2 (two) times daily. Patient not taking: Reported on 04/11/2017 02/16/17   Vicie Mutters, PA-C    Family History Family History  Problem Relation Age of Onset  . Heart failure Mother        died from  . Hypertension Mother   . Heart attack Father        died from  . Hypertension Sister     Social History Social History  Substance Use Topics  . Smoking status: Never Smoker  . Smokeless tobacco: Never Used  . Alcohol use No     Allergies   Ciprofloxacin; Robaxin  [methocarbamol]; Sulfonamide derivatives; and Verapamil   Review of Systems Review of Systems  Constitutional: Negative for chills and fever.  HENT: Negative for congestion.   Respiratory: Negative for cough and shortness of breath (Pt states she is at her baseline in regards to breathing. ).   Cardiovascular: Negative for chest pain.  Gastrointestinal: Positive for abdominal pain (RUQ), nausea and vomiting. Negative for constipation and diarrhea.  Genitourinary: Positive for flank pain. Negative for dysuria, frequency and hematuria.  All other systems reviewed and are negative.    Physical Exam Updated Vital Signs BP 124/63   Pulse 82   Temp 98.9 F (37.2 C) (Oral)   Resp 15   Ht 5\' 4"  (1.626 m)   Wt 102.5 kg (226 lb)   SpO2 90%   BMI 38.79 kg/m   Physical Exam  Constitutional: She is oriented to person, place, and time. She appears well-developed and well-nourished.  Pt appears uncomfortable.  HENT:  Head: Normocephalic and atraumatic.  Eyes: Pupils are equal, round, and reactive to light. Conjunctivae and EOM are normal.  Neck: Normal range of motion. Neck supple.  Cardiovascular: Normal rate, regular rhythm, normal heart sounds and intact distal pulses.  Exam reveals no friction rub.   No murmur heard. Pulmonary/Chest: Effort normal. No respiratory distress. She has wheezes. She has no rales. She exhibits no tenderness.  Mild expiratory wheezes b/l lower lung fields. Pt using abdominal muscles with breathing.  Abdominal: Soft. Bowel sounds are normal. She exhibits no distension and no mass. There is tenderness (RUQ extending around right flank into CVA ). There is no rebound and no guarding.  Neurological: She is alert and oriented to person, place, and time.  Skin: Skin is warm.  Psychiatric: She has a normal mood and affect. Her behavior is normal.  Nursing note and vitals reviewed.    ED Treatments / Results  Labs (all labs ordered are listed, but only abnormal  results are displayed) Labs Reviewed  URINALYSIS, ROUTINE W REFLEX MICROSCOPIC - Abnormal; Notable for the following:       Result Value   Color, Urine AMBER (*)    APPearance CLOUDY (*)    Hgb urine dipstick LARGE (*)    Protein, ur 100 (*)    Leukocytes, UA LARGE (*)    Bacteria, UA MANY (*)    Squamous Epithelial / LPF 0-5 (*)    Crystals PRESENT (*)    All other components within normal limits  COMPREHENSIVE METABOLIC PANEL - Abnormal; Notable for the following:    Glucose, Bld 113 (*)    Albumin 2.9 (*)    AST 82 (*)    Total Bilirubin 2.2 (*)    All other components within normal limits  CBC WITH DIFFERENTIAL/PLATELET - Abnormal; Notable for the following:    WBC 2.9 (*)    RDW 18.2 (*)  Lymphs Abs 0.3 (*)    All other components within normal limits  URINE CULTURE  RESPIRATORY PANEL BY PCR  PROTIME-INR    EKG  EKG Interpretation None       Radiology Dg Chest 2 View  Result Date: 04/11/2017 CLINICAL DATA:  76 year old female with right-sided flank pain radiating to abdomen. EXAM: CHEST  2 VIEW COMPARISON:  10/01/2014 FINDINGS: Cardiomediastinal silhouette is slightly enlarged. Note is made of low lung volumes with vascular congestion. Increased opacity at the left lung base concerning for focal infiltrate. No significant pleural effusions or pneumothorax. No acute osseous abnormalities. IMPRESSION: Markedly low lung volumes with left basilar opacity concerning for focal infiltrate. Follow-up to resolution recommended. Electronically Signed   By: Kristopher Oppenheim M.D.   On: 04/11/2017 17:41   Ct Renal Stone Study  Result Date: 04/11/2017 CLINICAL DATA:  complaints of right sided flank pain radiating around into abdomen. Pain 10/10. Nausea/dry heaving. Denies urinary symptoms. Patient was vomiting yellow bile while she was on CT table. EXAM: CT ABDOMEN AND PELVIS WITHOUT CONTRAST TECHNIQUE: Multidetector CT imaging of the abdomen and pelvis was performed following the  standard protocol without IV contrast. COMPARISON:  10/02/2014 FINDINGS: Lower chest: No acute abnormality. Hepatobiliary: Small nodular liver consistent with advanced cirrhosis. Small cyst at the dome of the right lobe. No other liver mass or focal lesion. Status post cholecystectomy. No bile duct dilation. Pancreas: Several calcifications along the inferior margin of the pancreatic head. No pancreatic mass or inflammation. Spleen: Enlarged measuring 19 x 8 x 14 cm. No splenic mass or focal lesion. Adrenals/Urinary Tract: No adrenal masses. Mild dilation of the right intrarenal collecting system. Portions of the proximal right ureter mildly dilated. No right ureteral stone. No right renal mass or intrarenal stone. No left renal mass. No left hydronephrosis. Small nonobstructing stone in the upper pole. Normal left ureter. Bladder is unremarkable. Stomach/Bowel: Status post right partial colectomy. Anastomosis staple line is noted in the right mid to lower abdomen. No colonic wall thickening. There are scattered diverticular the left colon without diverticulitis. Stomach is unremarkable. No small bowel obstruction or convincing inflammation. Small bowel anastomosis staple line noted in the right mid to lower abdomen. Vascular/Lymphatic: There are numerous vascular collaterals in the upper abdomen reflecting portal venous hypertension. These include paraesophageal collaterals, stable when compared to the prior CT. Mild aortic atherosclerosis. There scattered prominent gastrohepatic and retroperitoneal lymph nodes none of which are pathologically enlarged by size criteria. Reproductive: Status post hysterectomy. No adnexal masses. Other: Small amount of ascites collects adjacent to the liver and spleen and between the leaves of the small bowel mesenteric. No abdominal wall hernia. Musculoskeletal: No fracture or acute finding. No osteoblastic or osteolytic lesions. IMPRESSION: 1. There is mild dilation of the right  intrarenal collecting system and portions of the proximal to mid right ureter, but no evidence of a ureteral stone. The possibility of a non radiopaque ureteral stone or recent passage of a stone should be considered given the presenting history. 2. No other evidence of an acute abnormality. 3. There changes of advanced cirrhosis with portal venous hypertension reflected by extensive varices, splenomegaly and a small amount of ascites. Ascites has increased in amount from the prior CT. Other findings are stable. 4. Stable changes from bowel surgery. 5. Mild aortic atherosclerosis. Electronically Signed   By: Lajean Manes M.D.   On: 04/11/2017 16:05    Procedures Procedures (including critical care time)  Medications Ordered in ED Medications  azithromycin (ZITHROMAX)  500 mg in dextrose 5 % 250 mL IVPB (500 mg Intravenous New Bag/Given 04/11/17 2206)  ondansetron (ZOFRAN) injection 4 mg (4 mg Intravenous Given 04/11/17 1548)  HYDROmorphone (DILAUDID) injection 0.5 mg (0.5 mg Intravenous Given 04/11/17 1529)  sodium chloride 0.9 % bolus 500 mL (0 mLs Intravenous Stopped 04/11/17 1713)  sodium chloride 0.9 % bolus 500 mL (0 mLs Intravenous Stopped 04/11/17 1714)  cefTRIAXone (ROCEPHIN) 1 g in dextrose 5 % 50 mL IVPB (0 g Intravenous Stopped 04/11/17 2138)     Initial Impression / Assessment and Plan / ED Course  I have reviewed the triage vital signs and the nursing notes.  Pertinent labs & imaging results that were available during my care of the patient were reviewed by me and considered in my medical decision making (see chart for details).  Clinical Course as of Apr 11 2300  Sat Apr 11, 2017  1852 On reevaluation, patient is resting more comfortably, with no increased work of breathing.  She states pain and nausea have resolved.  [JR]  1914 CXR consistent with pneumonia. Will ambulate pt and monitor O2 sat. Urine pending.   [JR]  2011 Pt too weak to ambulate and dropping O2 saturation.   [JR]      Clinical Course User Index [JR] Krystian Ferrentino, [place] N, PA-C    Presenting with acute onset of right flank pain, with nausea and vomiting.  Patient with history of nephrolithiasis.  On arrival patient uncomfortable, afebrile, nontoxic-appearing.  Some increased work of breathing, however patient reporting she is at her baseline with breathing, without shortness of breath, cough, or congestion.  CT stone study done showing evidence of recent ureteral stone versus non-radiopaque stone in the right ureter.  Patient's pain and nausea improved significantly with Dilaudid and Zofran.  IV fluids given.  Patient temperature increasing slightly with some tachypnea and hypoxia to 90%.  Nasal cannula was placed by nursing.  Patient reevaluated stating she is continues to be at her baseline, however chest x-ray obtained 2/t changes in vital signs.  Showing left lower lobe opacity consistent with pneumonia.  Some delay in obtaining her urine, though once obtained showing infection.  Pneumonia and UTI treated with Rocephin and azithromycin.  Patient attempted to ambulate, however feeling weak and became tachypneic with O2 saturation dropping into 80s.  Hospitalist consulted for admission, as patient is unsafe for discharge at this time. Spoke with Dr. Roel Cluck with [facility]; accepting admission.  Patient discussed with and seen by Dr. Lacinda Axon, also discussed with Dr. Lita Mains prior to admission.  The patient appears reasonably stabilized for admission considering the current resources, flow, and capabilities available in the ED at this time, and I doubt any other [facility] requiring further screening and/or treatment in the ED prior to admission.  Final Clinical Impressions(s) / ED Diagnoses   Final diagnoses:  Pyelonephritis  Hypoxia  Community acquired pneumonia of left lower lobe of lung [facility])    New Prescriptions New Prescriptions   No medications on file     Zaydrian Batta, [place] N, PA-C 04/11/17 2302    Nat Christen, MD 04/12/17 1016

## 2017-04-11 NOTE — ED Notes (Signed)
Pt sat on the side of the bed for a few minutes acclimating to no O2.  Pt's sats dipped to 86%. Writer took pt to the bathroom using the Steady.  Pt's 02 stayed around 89-90.  Respirations increased drastically. Pt also feel asleep between almost every command. Pt very weak.

## 2017-04-11 NOTE — ED Notes (Signed)
Patient transported to X-ray 

## 2017-04-12 ENCOUNTER — Inpatient Hospital Stay (HOSPITAL_COMMUNITY): Payer: PPO

## 2017-04-12 ENCOUNTER — Other Ambulatory Visit: Payer: Self-pay

## 2017-04-12 DIAGNOSIS — A419 Sepsis, unspecified organism: Secondary | ICD-10-CM | POA: Diagnosis present

## 2017-04-12 DIAGNOSIS — R0600 Dyspnea, unspecified: Secondary | ICD-10-CM

## 2017-04-12 LAB — RESPIRATORY PANEL BY PCR
ADENOVIRUS-RVPPCR: NOT DETECTED
BORDETELLA PERTUSSIS-RVPCR: NOT DETECTED
CHLAMYDOPHILA PNEUMONIAE-RVPPCR: NOT DETECTED
CORONAVIRUS 229E-RVPPCR: NOT DETECTED
CORONAVIRUS HKU1-RVPPCR: NOT DETECTED
CORONAVIRUS NL63-RVPPCR: NOT DETECTED
Coronavirus OC43: NOT DETECTED
Influenza A: NOT DETECTED
Influenza B: NOT DETECTED
MYCOPLASMA PNEUMONIAE-RVPPCR: NOT DETECTED
Metapneumovirus: NOT DETECTED
PARAINFLUENZA VIRUS 1-RVPPCR: NOT DETECTED
PARAINFLUENZA VIRUS 2-RVPPCR: NOT DETECTED
Parainfluenza Virus 3: NOT DETECTED
Parainfluenza Virus 4: NOT DETECTED
Respiratory Syncytial Virus: NOT DETECTED
Rhinovirus / Enterovirus: NOT DETECTED

## 2017-04-12 LAB — COMPREHENSIVE METABOLIC PANEL
ALBUMIN: 2.5 g/dL — AB (ref 3.5–5.0)
ALK PHOS: 64 U/L (ref 38–126)
ALT: 68 U/L — AB (ref 14–54)
ANION GAP: 14 (ref 5–15)
AST: 175 U/L — AB (ref 15–41)
BILIRUBIN TOTAL: 1.7 mg/dL — AB (ref 0.3–1.2)
BUN: 28 mg/dL — ABNORMAL HIGH (ref 6–20)
CO2: 17 mmol/L — AB (ref 22–32)
Calcium: 9.1 mg/dL (ref 8.9–10.3)
Chloride: 106 mmol/L (ref 101–111)
Creatinine, Ser: 2.02 mg/dL — ABNORMAL HIGH (ref 0.44–1.00)
GFR calc Af Amer: 27 mL/min — ABNORMAL LOW (ref >60)
GFR calc non Af Amer: 23 mL/min — ABNORMAL LOW (ref >60)
GLUCOSE: 80 mg/dL (ref 65–99)
POTASSIUM: 4 mmol/L (ref 3.5–5.1)
Sodium: 137 mmol/L (ref 135–145)
Total Protein: 6.1 g/dL — ABNORMAL LOW (ref 6.5–8.1)

## 2017-04-12 LAB — CBC WITH DIFFERENTIAL/PLATELET
BAND NEUTROPHILS: 7 %
BASOS ABS: 0 10*3/uL (ref 0.0–0.1)
BASOS PCT: 0 %
EOS PCT: 0 %
Eosinophils Absolute: 0 10*3/uL (ref 0.0–0.7)
HEMATOCRIT: 40.5 % (ref 36.0–46.0)
HEMOGLOBIN: 13.1 g/dL (ref 12.0–15.0)
LYMPHS PCT: 2 %
Lymphs Abs: 0.4 10*3/uL — ABNORMAL LOW (ref 0.7–4.0)
MCH: 31 pg (ref 26.0–34.0)
MCHC: 32.3 g/dL (ref 30.0–36.0)
MCV: 96 fL (ref 78.0–100.0)
METAMYELOCYTES PCT: 4 %
MONOS PCT: 16 %
Monocytes Absolute: 3.3 10*3/uL — ABNORMAL HIGH (ref 0.1–1.0)
Neutro Abs: 17 10*3/uL — ABNORMAL HIGH (ref 1.7–7.7)
Neutrophils Relative %: 71 %
Platelets: 193 10*3/uL (ref 150–400)
RBC: 4.22 MIL/uL (ref 3.87–5.11)
RDW: 19.1 % — AB (ref 11.5–15.5)
WBC: 20.7 10*3/uL — ABNORMAL HIGH (ref 4.0–10.5)

## 2017-04-12 LAB — PROTIME-INR
INR: 1.64
PROTHROMBIN TIME: 19.3 s — AB (ref 11.4–15.2)

## 2017-04-12 LAB — HEMOGLOBIN A1C
HEMOGLOBIN A1C: 5.3 % (ref 4.8–5.6)
MEAN PLASMA GLUCOSE: 105.41 mg/dL

## 2017-04-12 LAB — ECHOCARDIOGRAM COMPLETE
Height: 64 in
WEIGHTICAEL: 3616 [oz_av]

## 2017-04-12 LAB — LACTIC ACID, PLASMA
LACTIC ACID, VENOUS: 6.5 mmol/L — AB (ref 0.5–1.9)
LACTIC ACID, VENOUS: 5.5 mmol/L — AB (ref 0.5–1.9)
Lactic Acid, Venous: 4.8 mmol/L (ref 0.5–1.9)

## 2017-04-12 LAB — MRSA PCR SCREENING: MRSA BY PCR: NEGATIVE

## 2017-04-12 LAB — GLUCOSE, CAPILLARY
GLUCOSE-CAPILLARY: 140 mg/dL — AB (ref 65–99)
Glucose-Capillary: 121 mg/dL — ABNORMAL HIGH (ref 65–99)

## 2017-04-12 LAB — CBG MONITORING, ED: Glucose-Capillary: 74 mg/dL (ref 65–99)

## 2017-04-12 LAB — INFLUENZA PANEL BY PCR (TYPE A & B)
INFLAPCR: NEGATIVE
Influenza B By PCR: NEGATIVE

## 2017-04-12 LAB — PHOSPHORUS: PHOSPHORUS: 5.5 mg/dL — AB (ref 2.5–4.6)

## 2017-04-12 LAB — MAGNESIUM: Magnesium: 1.8 mg/dL (ref 1.7–2.4)

## 2017-04-12 LAB — TSH: TSH: 1.583 u[IU]/mL (ref 0.350–4.500)

## 2017-04-12 LAB — PROCALCITONIN: Procalcitonin: >150 ng/mL

## 2017-04-12 MED ORDER — DEXTROSE 5 % IV SOLN
2.0000 g | INTRAVENOUS | Status: DC
  Administered 2017-04-12 – 2017-04-14 (×3): 2 g via INTRAVENOUS
  Filled 2017-04-12 (×4): qty 2

## 2017-04-12 MED ORDER — METHYLPREDNISOLONE SODIUM SUCC 40 MG IJ SOLR
40.0000 mg | Freq: Two times a day (BID) | INTRAMUSCULAR | Status: AC
  Administered 2017-04-12 (×2): 40 mg via INTRAVENOUS
  Filled 2017-04-12 (×2): qty 1

## 2017-04-12 MED ORDER — PREDNISONE 20 MG PO TABS
40.0000 mg | ORAL_TABLET | Freq: Every day | ORAL | Status: AC
  Administered 2017-04-13 – 2017-04-16 (×4): 40 mg via ORAL
  Filled 2017-04-12 (×5): qty 2

## 2017-04-12 MED ORDER — DEXTROSE 5 % IV SOLN
1.0000 g | INTRAVENOUS | Status: DC
  Filled 2017-04-12: qty 10

## 2017-04-12 MED ORDER — SODIUM CHLORIDE 0.9 % IV BOLUS (SEPSIS)
1000.0000 mL | Freq: Once | INTRAVENOUS | Status: AC
  Administered 2017-04-12: 1000 mL via INTRAVENOUS

## 2017-04-12 MED ORDER — LEVOTHYROXINE SODIUM 112 MCG PO TABS: 112.0000 ug | ORAL_TABLET | Freq: Every day | ORAL | Status: DC

## 2017-04-12 MED ORDER — ONDANSETRON HCL 4 MG/2ML IJ SOLN: 4.0000 mg | Freq: Four times a day (QID) | INTRAMUSCULAR | Status: DC | PRN

## 2017-04-12 MED ORDER — SODIUM CHLORIDE 0.9 % IV SOLN
2000.0000 mg | Freq: Once | INTRAVENOUS | Status: AC
  Administered 2017-04-12: 2000 mg via INTRAVENOUS
  Filled 2017-04-12: qty 2000

## 2017-04-12 MED ORDER — BISACODYL 10 MG RE SUPP: 10.0000 mg | Freq: Every day | RECTAL | Status: DC | PRN

## 2017-04-12 MED ORDER — LEVOTHYROXINE SODIUM 112 MCG PO TABS
112.0000 ug | ORAL_TABLET | Freq: Every day | ORAL | Status: DC
  Administered 2017-04-12 – 2017-04-17 (×6): 112 ug via ORAL
  Filled 2017-04-12 (×6): qty 1

## 2017-04-12 MED ORDER — IPRATROPIUM-ALBUTEROL 0.5-2.5 (3) MG/3ML IN SOLN
3.0000 mL | Freq: Three times a day (TID) | RESPIRATORY_TRACT | Status: DC
  Administered 2017-04-13 – 2017-04-14 (×4): 3 mL via RESPIRATORY_TRACT
  Filled 2017-04-12 (×4): qty 3

## 2017-04-12 MED ORDER — ENOXAPARIN SODIUM 40 MG/0.4ML ~~LOC~~ SOLN
40.0000 mg | SUBCUTANEOUS | Status: DC
  Administered 2017-04-12: 40 mg via SUBCUTANEOUS
  Filled 2017-04-12: qty 0.4

## 2017-04-12 MED ORDER — POLYETHYLENE GLYCOL 3350 17 G PO PACK: 17.0000 g | PACK | Freq: Every day | ORAL | Status: DC | PRN

## 2017-04-12 MED ORDER — BUDESONIDE 0.5 MG/2ML IN SUSP
0.5000 mg | Freq: Two times a day (BID) | RESPIRATORY_TRACT | Status: DC
  Administered 2017-04-12 – 2017-04-17 (×11): 0.5 mg via RESPIRATORY_TRACT
  Filled 2017-04-12 (×10): qty 2

## 2017-04-12 MED ORDER — SENNA 8.6 MG PO TABS
1.0000 | ORAL_TABLET | Freq: Two times a day (BID) | ORAL | Status: DC
  Administered 2017-04-12 – 2017-04-17 (×11): 8.6 mg via ORAL
  Filled 2017-04-12 (×11): qty 1

## 2017-04-12 MED ORDER — FLUTICASONE PROPIONATE 50 MCG/ACT NA SUSP
2.0000 | Freq: Every day | NASAL | Status: DC
  Administered 2017-04-12 – 2017-04-16 (×5): 2 via NASAL
  Filled 2017-04-12: qty 16

## 2017-04-12 MED ORDER — DEXTROSE 5 % IV SOLN
500.0000 mg | INTRAVENOUS | Status: DC
  Administered 2017-04-12 – 2017-04-13 (×2): 500 mg via INTRAVENOUS
  Filled 2017-04-12 (×3): qty 500

## 2017-04-12 MED ORDER — VANCOMYCIN HCL IN DEXTROSE 1-5 GM/200ML-% IV SOLN
1000.0000 mg | INTRAVENOUS | Status: DC
Start: 2017-04-14 — End: 2017-04-14

## 2017-04-12 MED ORDER — SODIUM CHLORIDE 0.9 % IV BOLUS (SEPSIS)
500.0000 mL | Freq: Once | INTRAVENOUS | Status: AC
  Administered 2017-04-12: 14:00:00 via INTRAVENOUS

## 2017-04-12 MED ORDER — FLECAINIDE ACETATE 100 MG PO TABS: 100.0000 mg | ORAL_TABLET | Freq: Two times a day (BID) | ORAL | Status: DC

## 2017-04-12 MED ORDER — LORATADINE 10 MG PO TABS
10.0000 mg | ORAL_TABLET | Freq: Every day | ORAL | Status: DC
  Administered 2017-04-12 – 2017-04-17 (×6): 10 mg via ORAL
  Filled 2017-04-12 (×6): qty 1

## 2017-04-12 MED ORDER — SODIUM CHLORIDE 0.9 % IV SOLN
INTRAVENOUS | Status: DC
  Administered 2017-04-12: 09:00:00 via INTRAVENOUS

## 2017-04-12 MED ORDER — INSULIN ASPART 100 UNIT/ML ~~LOC~~ SOLN
0.0000 [IU] | Freq: Three times a day (TID) | SUBCUTANEOUS | Status: DC
  Administered 2017-04-13 – 2017-04-16 (×5): 2 [IU] via SUBCUTANEOUS

## 2017-04-12 MED ORDER — INSULIN ASPART 100 UNIT/ML ~~LOC~~ SOLN: 0.0000 [IU] | Freq: Every day | SUBCUTANEOUS | Status: DC

## 2017-04-12 MED ORDER — GUAIFENESIN ER 600 MG PO TB12
1200.0000 mg | ORAL_TABLET | Freq: Two times a day (BID) | ORAL | Status: DC
  Administered 2017-04-12 – 2017-04-17 (×11): 1200 mg via ORAL
  Filled 2017-04-12 (×11): qty 2

## 2017-04-12 MED ORDER — SODIUM CHLORIDE 0.9 % IV SOLN
INTRAVENOUS | Status: DC
  Administered 2017-04-12 (×2): via INTRAVENOUS
  Administered 2017-04-13: 100 mL/h via INTRAVENOUS

## 2017-04-12 MED ORDER — ONDANSETRON HCL 4 MG PO TABS: 4.0000 mg | ORAL_TABLET | Freq: Four times a day (QID) | ORAL | Status: DC | PRN

## 2017-04-12 MED ORDER — FLECAINIDE ACETATE 100 MG PO TABS
100.0000 mg | ORAL_TABLET | Freq: Two times a day (BID) | ORAL | Status: DC
  Administered 2017-04-12 – 2017-04-14 (×5): 100 mg via ORAL
  Filled 2017-04-12 (×5): qty 1

## 2017-04-12 MED ORDER — ENOXAPARIN SODIUM 30 MG/0.3ML ~~LOC~~ SOLN
30.0000 mg | SUBCUTANEOUS | Status: DC
  Administered 2017-04-13 – 2017-04-15 (×3): 30 mg via SUBCUTANEOUS
  Filled 2017-04-12 (×3): qty 0.3

## 2017-04-12 NOTE — Progress Notes (Signed)
Pharmacy Antibiotic Note  Brittney Valentine is a 76 y.o. female admitted on 04/11/2017 with sepsis.  Pharmacy has been consulted for vancomycin and cefepime dosing.  Rocephin and azithromycin given in the ED last night.  WBC 2.9>>20.7, Cr 0.79>>2.02, PCT > 150, lactate 6.5. t max 100.1. Positive UA.   Plan: cefepime 2 gm IV q24 Vancomycin 2 gm loading dose followed by vancomycin 1 gm IV q48 for est AUC 466 F/u renal fxn, temp, WBC, culture data Vancomycin levels as needed  Height: 5\' 4"  (162.6 cm) Weight: 226 lb (102.5 kg) IBW/kg (Calculated) : 54.7  Temp (24hrs), Avg:99.2 F (37.3 C), Min:98.5 F (36.9 C), Max:100.1 F (37.8 C)  Recent Labs  Lab 04/11/17 1522 04/12/17 0923 04/12/17 0924  WBC 2.9*  --  20.7*  CREATININE 0.79  --  2.02*  LATICACIDVEN  --  6.5*  --     Estimated Creatinine Clearance: 28.9 mL/min (A) (by C-G formula based on SCr of 2.02 mg/dL (H)).    Allergies  Allergen Reactions  . Ciprofloxacin     Swelling  . Robaxin [Methocarbamol]     palpiations  . Sulfonamide Derivatives     See little dots, skin feels like its burning  . Verapamil     palpitations    Antimicrobials this admission: 11/3 azith x 1 11/3 rocephin x 1 11/4 vanc >> 11/4 cefepime >> Dose adjustments this admission:  Microbiology results: 11/4 BCx2>> 11/4 resp panel>> 11/3 Ucx>> 11/4 flu neg 11/3 + UA  Thank you for allowing pharmacy to be a part of this patient's care.  Eudelia Bunch, Pharm.D. [phone] 04/12/2017 1:59 PM

## 2017-04-12 NOTE — ED Notes (Signed)
Echocardiogram Tech at bedside.

## 2017-04-12 NOTE — ED Notes (Addendum)
Date and time results received: 04/12/17 1034  (use smartphrase ".now" to insert current time)  Test: Lactic Critical Value: 6.5  Name of Provider Notified:Grandville Silos  Orders Received? Or Actions Taken?: Actions Taken: Notified Provider

## 2017-04-12 NOTE — Progress Notes (Addendum)
PROGRESS NOTE    Brittney Valentine  LEX:[id number] DOB: Dec 20, 1940 DOA: 04/11/2017 PCP: Unk Pinto, MD    Brief Narrative:     Assessment & Plan:   Principal Problem:   Sepsis [facility]) Active Problems:   Hepatic cirrhosis ([place])   Atrial fibrillation ([place])   Essential hypertension   Prediabetes   Hypothyroidism   Obstructive sleep apnea   Acute lower UTI   Acute respiratory failure with hypoxia (H[facility])   Leukopenia   RAD (reactive airway disease), mild intermittent, with acute exacerbation  #1 sepsis Patient presenting with right-sided flank pain radiation around the abdomen. In the ED patient was noted to have a persistent tachypnea as well as hypoxia noted with minimal ambulation with sats in the mid 80s. Chest x-ray obtained was worrisome for possible pneumonia. CT angiogram chest obtained was negative for PE however did have areas of multifocal atelectasis greater in the left lung. Urinalysis obtained had large leukocytes, nitrite negative, too numerous to count RBCs, too numerous to count WBCs. Patient noted to be hypotensive with systolic blood pressures in the 80s on my assessment in the ED, with tachycardia, leukocytosis with a white count of 20.7, lactic acid of 6.5, worsening AST and worsening renal function. Patient awaiting bed in the step down unit. We'll check blood cultures 2. Urine cultures pending. Will discontinue IV Rocephin and placed on IV cefepime and add IV vancomycin to current regimen of azithromycin. IV fluid resuscitation. Follow.  #2 acute hypoxic respiratory distress likely secondary to probable pneumonia Patient was admitted and being treated for concerns for probable pneumonia. Patient does endorse a productive cough. Sputum Gram stain and cultures are pending. Blood cultures pending. Influenza PCR negative. Continue IV azithromycin. Will discontinue IV Rocephin and place on IV cefepime and IV vancomycin secondary to sepsis. Continue Mucinex, scheduled  nebulizers, steroid taper. Will add Pulmicort and Flonase. Follow.  #3 hypothyroidism Check a TSH. Continue home dose Synthroid.  #4 urinary tract infection Urine cultures pending. IV cefepime.  #5  CHA2DS2Vasc score 3 Continue home regimen of Tambocor. Patient deemed not a anticoagulation candidate due to risk of bleeding secondary to cirrhosis. Follow.  #6 hypertension Patient not on any antihypertensive medications at home. Patient currently hypotensive. Follow.  #7 acute renal failure Likely secondary to a prerenal azotemia. Check a fractional excretion of sodium. Place on IV fluids. Follow.  #8 obstructive sleep apnea Doesn't tolerate CPAP. O2 at bedtime.  #9 history of cirrhosis/portal hypertension/esophageal varices Currently stable. Monitor closely with fluid resuscitation.   DVT prophylaxis: Lovenox Code Status: DO NOT RESUSCITATE Family Communication: Updated patient. No family at bedside. Disposition Plan: Awaiting bed in step down unit.   Consultants:   None  Procedures:   Chest x-ray 04/11/2017  CT Angela chest 04/11/2017  CT renal stone total call 04/11/2017  Antimicrobials:   IV azithromycin 04/11/2017  IV cefepime 04/12/2017  IV Rocephin 04/11/2017>>> 04/12/2017  IV vancomycin 04/12/2017   Subjective: Patient laying on gurney. Patient denies any chest pain or shortness of breath. Patient states side pain has improved since admission.  Objective: Vitals:   04/12/17 1100 04/12/17 1130 04/12/17 1212 04/12/17 1230  BP: (!) 103/40 (!) 100/49 (!) 89/51 (!) 92/48  Pulse: 90 92 74 89  Resp: 16 16 18 15   Temp:      TempSrc:      SpO2: 98% 98% 100% 98%  Weight:      Height:        Intake/Output Summary (Last 24 hours) at 04/12/2017  [place] filed at 04/11/2017 2342 Gross per 24 hour  Intake 1300 ml  Output -  Net 1300 ml   Filed Weights   04/11/17 1629  Weight: 102.5 kg (226 lb)    Examination:  General exam:  Sleeping Respiratory system: Poor air movement. No crackles. No wheezing. Respiratory effort normal. Cardiovascular system: S1 & S2 heard, RRR. No JVD, murmurs, rubs, gallops or clicks. No pedal edema. Gastrointestinal system: Abdomen is nondistended, soft and nontender. No organomegaly or masses felt. Normal bowel sounds heard. Central nervous system: Alert and oriented. No focal neurological deficits. Extremities: Symmetric 5 x 5 power. Skin: No rashes, lesions or ulcers Psychiatry: Judgement and insight appear normal. Mood & affect appropriate.     Data Reviewed: I have personally reviewed following labs and imaging studies  CBC: Recent Labs  Lab 04/11/17 1522 04/12/17 0924  WBC 2.9* 20.7*  NEUTROABS 2.5 17.0*  HGB 14.5 13.1  HCT 43.6 40.5  MCV 95.0 96.0  PLT 204 462   Basic Metabolic Panel: Recent Labs  Lab 04/11/17 1522 04/12/17 0924  NA 140 137  K 4.7 4.0  CL 110 106  CO2 22 17*  GLUCOSE 113* 80  BUN 15 28*  CREATININE 0.79 2.02*  CALCIUM 9.4 9.1  MG  --  1.8  PHOS  --  5.5*   GFR: Estimated Creatinine Clearance: 28.9 mL/min (A) (by C-G formula based on SCr of 2.02 mg/dL (H)). Liver Function Tests: Recent Labs  Lab 04/11/17 1522 04/12/17 0924  AST 82* 175*  ALT 32 68*  ALKPHOS 106 64  BILITOT 2.2* 1.7*  PROT 6.8 6.1*  ALBUMIN 2.9* 2.5*   No results for input(s): LIPASE, AMYLASE in the last 168 hours. No results for input(s): AMMONIA in the last 168 hours. Coagulation Profile: Recent Labs  Lab 04/12/17 0923  INR 1.64   Cardiac Enzymes: No results for input(s): CKTOTAL, CKMB, CKMBINDEX, TROPONINI in the last 168 hours. BNP (last 3 results) No results for input(s): PROBNP in the last 8760 hours. HbA1C: Recent Labs    04/12/17 0924  HGBA1C 5.3   CBG: Recent Labs  Lab 04/12/17 1207  GLUCAP 74   Lipid Profile: No results for input(s): CHOL, HDL, LDLCALC, TRIG, CHOLHDL, LDLDIRECT in the last 72 hours. Thyroid Function Tests: Recent Labs     04/12/17 0924  TSH 1.583   Anemia Panel: No results for input(s): VITAMINB12, FOLATE, FERRITIN, TIBC, IRON, RETICCTPCT in the last 72 hours. Sepsis Labs: Recent Labs  Lab 04/12/17 0923 04/12/17 0924  PROCALCITON  --  >150.00  LATICACIDVEN 6.5*  --     No results found for this or any previous visit (from the past 240 hour(s)).       Radiology Studies: Dg Chest 2 View  Result Date: 04/11/2017 CLINICAL DATA:  76 year old female with right-sided flank pain radiating to abdomen. EXAM: CHEST  2 VIEW COMPARISON:  10/01/2014 FINDINGS: Cardiomediastinal silhouette is slightly enlarged. Note is made of low lung volumes with vascular congestion. Increased opacity at the left lung base concerning for focal infiltrate. No significant pleural effusions or pneumothorax. No acute osseous abnormalities. IMPRESSION: Markedly low lung volumes with left basilar opacity concerning for focal infiltrate. Follow-up to resolution recommended. Electronically Signed   By: Kristopher Oppenheim M.D.   On: 04/11/2017 17:41   Ct Angio Chest Pe W Or Wo Contrast  Result Date: 04/11/2017 CLINICAL DATA:  Shortness of breath. Tachypnea and oxygen desaturation when ambulating. EXAM: CT ANGIOGRAPHY CHEST WITH CONTRAST TECHNIQUE: Multidetector  CT imaging of the chest was performed using the standard protocol during bolus administration of intravenous contrast. Multiplanar CT image reconstructions and MIPs were obtained to evaluate the vascular anatomy. CONTRAST:  100 cc Isovue 370 IV COMPARISON:  Chest radiograph earlier this day. Abdominal CT earlier this day. FINDINGS: Cardiovascular: There are no filling defects within the pulmonary arteries to the segmental level suggest pulmonary embolus. Cannot assess subsegmental branches due to contrast bolus timing and soft tissue attenuation from habitus. Mild multi chamber cardiomegaly. Atherosclerosis of the thoracic aorta. No evidence of dissection. No pericardial fluid.  Mediastinum/Nodes: Small hiatal hernia with large paraesophageal varices. No enlarged mediastinal or hilar nodes, multiple small mediastinal nodes are seen. Visualized thyroid gland is diminutive. Lungs/Pleura: Subsegmental atelectasis in the lingula, right middle lobe and both lower lobes, greater on the left. No confluent consolidation. Linear subpleural opacities favoring atelectasis over septal thickening. No pleural fluid. Upper Abdomen: Characterized on abdominal CT today. Cirrhosis and splenomegaly. Small amount of ascites. Perigastric and paraesophageal varices. Musculoskeletal: There are no acute or suspicious osseous abnormalities. Review of the MIP images confirms the above findings. IMPRESSION: 1. No central pulmonary embolus. 2. Mild cardiomegaly.  Aortic Atherosclerosis (ICD10-I70.0). 3. Multifocal atelectasis, greater in the left lung. No pleural fluid. 4. Cirrhosis, splenomegaly, paraesophageal and perigastric varices and small amount of abdominal ascites. Electronically Signed   By: Jeb Levering M.D.   On: 04/11/2017 23:38   Ct Renal Stone Study  Result Date: 04/11/2017 CLINICAL DATA:  complaints of right sided flank pain radiating around into abdomen. Pain 10/10. Nausea/dry heaving. Denies urinary symptoms. Patient was vomiting yellow bile while she was on CT table. EXAM: CT ABDOMEN AND PELVIS WITHOUT CONTRAST TECHNIQUE: Multidetector CT imaging of the abdomen and pelvis was performed following the standard protocol without IV contrast. COMPARISON:  10/02/2014 FINDINGS: Lower chest: No acute abnormality. Hepatobiliary: Small nodular liver consistent with advanced cirrhosis. Small cyst at the dome of the right lobe. No other liver mass or focal lesion. Status post cholecystectomy. No bile duct dilation. Pancreas: Several calcifications along the inferior margin of the pancreatic head. No pancreatic mass or inflammation. Spleen: Enlarged measuring 19 x 8 x 14 cm. No splenic mass or focal  lesion. Adrenals/Urinary Tract: No adrenal masses. Mild dilation of the right intrarenal collecting system. Portions of the proximal right ureter mildly dilated. No right ureteral stone. No right renal mass or intrarenal stone. No left renal mass. No left hydronephrosis. Small nonobstructing stone in the upper pole. Normal left ureter. Bladder is unremarkable. Stomach/Bowel: Status post right partial colectomy. Anastomosis staple line is noted in the right mid to lower abdomen. No colonic wall thickening. There are scattered diverticular the left colon without diverticulitis. Stomach is unremarkable. No small bowel obstruction or convincing inflammation. Small bowel anastomosis staple line noted in the right mid to lower abdomen. Vascular/Lymphatic: There are numerous vascular collaterals in the upper abdomen reflecting portal venous hypertension. These include paraesophageal collaterals, stable when compared to the prior CT. Mild aortic atherosclerosis. There scattered prominent gastrohepatic and retroperitoneal lymph nodes none of which are pathologically enlarged by size criteria. Reproductive: Status post hysterectomy. No adnexal masses. Other: Small amount of ascites collects adjacent to the liver and spleen and between the leaves of the small bowel mesenteric. No abdominal wall hernia. Musculoskeletal: No fracture or acute finding. No osteoblastic or osteolytic lesions. IMPRESSION: 1. There is mild dilation of the right intrarenal collecting system and portions of the proximal to mid right ureter, but no evidence of a  ureteral stone. The possibility of a non radiopaque ureteral stone or recent passage of a stone should be considered given the presenting history. 2. No other evidence of an acute abnormality. 3. There changes of advanced cirrhosis with portal venous hypertension reflected by extensive varices, splenomegaly and a small amount of ascites. Ascites has increased in amount from the prior CT. Other  findings are stable. 4. Stable changes from bowel surgery. 5. Mild aortic atherosclerosis. Electronically Signed   By: Lajean Manes M.D.   On: 04/11/2017 16:05        Scheduled Meds: . budesonide (PULMICORT) nebulizer solution  0.5 mg Nebulization BID  . enoxaparin (LOVENOX) injection  40 mg Subcutaneous Q24H  . flecainide  100 mg Oral BID  . fluticasone  2 spray Each Nare Daily  . guaiFENesin  1,200 mg Oral BID  . insulin aspart  0-15 Units Subcutaneous TID WC  . insulin aspart  0-5 Units Subcutaneous QHS  . ipratropium-albuterol  3 mL Nebulization Q6H  . levothyroxine  112 mcg Oral QAC breakfast  . loratadine  10 mg Oral Daily  . methylPREDNISolone (SOLU-MEDROL) injection  40 mg Intravenous Q12H   Followed by  . [START ON 04/13/2017] predniSONE  40 mg Oral Q supper  . senna  1 tablet Oral BID   Continuous Infusions: . sodium chloride    . azithromycin    . cefTRIAXone (ROCEPHIN)  IV    . sodium chloride       LOS: 1 day    Time spent: 40 minutes    Nabiha Planck, MD [facility] Pager [phone]  If 7PM-7AM, please contact night-coverage [email or web address] Password [email or web address] 04/12/2017, 1:40 PM

## 2017-04-12 NOTE — ED Notes (Signed)
Pt assisted to BR.

## 2017-04-12 NOTE — Progress Notes (Signed)
  Echocardiogram 2D Echocardiogram has been performed.  Reona Zendejas T Tahiry Spicer 04/12/2017, 3:32 PM

## 2017-04-13 ENCOUNTER — Inpatient Hospital Stay (HOSPITAL_COMMUNITY): Payer: PPO

## 2017-04-13 DIAGNOSIS — M7989 Other specified soft tissue disorders: Secondary | ICD-10-CM

## 2017-04-13 LAB — CREATININE, URINE, RANDOM: Creatinine, Urine: 122.69 mg/dL

## 2017-04-13 LAB — COMPREHENSIVE METABOLIC PANEL
ALBUMIN: 2.3 g/dL — AB (ref 3.5–5.0)
ALK PHOS: 53 U/L (ref 38–126)
ALT: 109 U/L — AB (ref 14–54)
AST: 239 U/L — ABNORMAL HIGH (ref 15–41)
Anion gap: 8 (ref 5–15)
BUN: 40 mg/dL — ABNORMAL HIGH (ref 6–20)
CALCIUM: 8.3 mg/dL — AB (ref 8.9–10.3)
CO2: 18 mmol/L — AB (ref 22–32)
CREATININE: 1.74 mg/dL — AB (ref 0.44–1.00)
Chloride: 109 mmol/L (ref 101–111)
GFR calc Af Amer: 32 mL/min — ABNORMAL LOW (ref >60)
GFR calc non Af Amer: 28 mL/min — ABNORMAL LOW (ref >60)
GLUCOSE: 164 mg/dL — AB (ref 65–99)
Potassium: 5.1 mmol/L (ref 3.5–5.1)
SODIUM: 135 mmol/L (ref 135–145)
TOTAL PROTEIN: 5.6 g/dL — AB (ref 6.5–8.1)
Total Bilirubin: 1.4 mg/dL — ABNORMAL HIGH (ref 0.3–1.2)

## 2017-04-13 LAB — CBC WITH DIFFERENTIAL/PLATELET
BASOS ABS: 0 10*3/uL (ref 0.0–0.1)
BASOS PCT: 0 %
EOS ABS: 0 10*3/uL (ref 0.0–0.7)
EOS PCT: 0 %
HCT: 36.7 % (ref 36.0–46.0)
HEMOGLOBIN: 11.9 g/dL — AB (ref 12.0–15.0)
Lymphocytes Relative: 5 %
Lymphs Abs: 0.7 10*3/uL (ref 0.7–4.0)
MCH: 30.7 pg (ref 26.0–34.0)
MCHC: 32.4 g/dL (ref 30.0–36.0)
MCV: 94.8 fL (ref 78.0–100.0)
Monocytes Absolute: 1.5 10*3/uL — ABNORMAL HIGH (ref 0.1–1.0)
Monocytes Relative: 10 %
Neutro Abs: 12.5 10*3/uL — ABNORMAL HIGH (ref 1.7–7.7)
Neutrophils Relative %: 85 %
PLATELETS: 150 10*3/uL (ref 150–400)
RBC: 3.87 MIL/uL (ref 3.87–5.11)
RDW: 19.3 % — ABNORMAL HIGH (ref 11.5–15.5)
WBC: 14.7 10*3/uL — AB (ref 4.0–10.5)

## 2017-04-13 LAB — TSH: TSH: 0.928 u[IU]/mL (ref 0.350–4.500)

## 2017-04-13 LAB — GLUCOSE, CAPILLARY
GLUCOSE-CAPILLARY: 121 mg/dL — AB (ref 65–99)
GLUCOSE-CAPILLARY: 134 mg/dL — AB (ref 65–99)
Glucose-Capillary: 123 mg/dL — ABNORMAL HIGH (ref 65–99)
Glucose-Capillary: 149 mg/dL — ABNORMAL HIGH (ref 65–99)

## 2017-04-13 LAB — LACTIC ACID, PLASMA
LACTIC ACID, VENOUS: 2.3 mmol/L — AB (ref 0.5–1.9)
Lactic Acid, Venous: 4 mmol/L (ref 0.5–1.9)

## 2017-04-13 LAB — PROCALCITONIN: PROCALCITONIN: 103.01 ng/mL

## 2017-04-13 LAB — POTASSIUM: Potassium: 5.1 mmol/L (ref 3.5–5.1)

## 2017-04-13 LAB — SODIUM, URINE, RANDOM: Sodium, Ur: <10 mmol/L

## 2017-04-13 LAB — MAGNESIUM: MAGNESIUM: 1.9 mg/dL (ref 1.7–2.4)

## 2017-04-13 MED ORDER — SODIUM CHLORIDE 0.9% FLUSH: 10.0000 mL | INTRAVENOUS | Status: DC | PRN

## 2017-04-13 MED ORDER — SODIUM CHLORIDE 0.9% FLUSH
10.0000 mL | Freq: Two times a day (BID) | INTRAVENOUS | Status: DC
  Administered 2017-04-13 – 2017-04-16 (×8): 10 mL

## 2017-04-13 NOTE — Care Management Note (Signed)
Case Management Note  Patient Details  Name: Brittney Valentine MRN: [id number] Date of Birth: 10-29-1940  Subjective/Objective:                  Flank pain urinary obstruction and sepsis  Action/Plan: Date:  April 13, 2017 Chart reviewed for concurrent status and case management needs.  Will continue to follow patient progress.  Discharge Planning: following for needs  Expected discharge date: April 16, 2017  Velva Harman, BSN, [place], [place]   Expected Discharge Date:                  Expected Discharge Plan:  Home/Self Care  In-House Referral:     Discharge planning Services  CM Consult  Post Acute Care Choice:    Choice offered to:     DME Arranged:    DME Agency:     HH Arranged:    HH Agency:     Status of Service:  In process, will continue to follow  If discussed at Long Length of Stay Meetings, dates discussed:    Additional Comments:  Leeroy Cha, RN 04/13/2017, 9:56 AM

## 2017-04-13 NOTE — Progress Notes (Signed)
Nutrition Brief Note  RD consulted for assessment via COPD Gold protocol.   Wt Readings from Last 15 Encounters:  04/12/17 241 lb 2.9 oz (109.4 kg)  02/24/17 226 lb 6.4 oz (102.7 kg)  02/16/17 235 lb (106.6 kg)  09/11/16 240 lb (108.9 kg)  05/19/16 232 lb (105.2 kg)  05/13/16 237 lb 3.2 oz (107.6 kg)  05/05/16 232 lb 9.6 oz (105.5 kg)  12/26/15 240 lb (108.9 kg)  07/16/15 239 lb (108.4 kg)  07/13/15 239 lb (108.4 kg)  12/21/14 242 lb (109.8 kg)  09/12/14 246 lb (111.6 kg)  06/22/14 233 lb (105.7 kg)  05/10/14 234 lb 6.4 oz (106.3 kg)  02/20/14 229 lb 3.2 oz (104 kg)    Body mass index is 40.13 kg/m. Patient meets criteria for morbid obesity based on current BMI. Per chart pt's weight has been trending upwards. Pt reports no weight loss.   Current diet order is 2 gram sodium, patient consumed approximately 75% of breakfast this morning, d/t food options. Pt reports good PO intake and appetite PTA. Pt reports she needs to stay away from sweets. Labs and medications reviewed.   Pt reports no nutritional impact symptoms except occasional constipation, RD discussed methods to help alleviate this.   No nutrition interventions warranted at this time. If nutrition issues arise, please consult RD.   Parks Ranger, MS, RDN, LDN 04/13/2017 10:27 AM

## 2017-04-13 NOTE — Progress Notes (Signed)
PROGRESS NOTE    JERRINE URSCHEL  VVO:[id number] DOB: 11-30-1940 DOA: 04/11/2017 PCP: Unk Pinto, MD    Brief Narrative:  Patient is a 76 year old female history of cirrhosis and portal hypertension, esophageal varices, paroxysmal age of fibrillation on anticoagulation treated with flecainide, hypertension hypothyroidism presented to the ED with right-sided flank pain radiating around abdomen with a 10 out of 10 associated pain. Patient noted to have a fever with a temp of 100.1. Patient noted to have a UTI. Patient noted to be septic as she was hypotensive, tachycardic, worsening renal function, elevated lactic acid level and pro calcitonin levels. Patient placed on broad-spectrum antibiotics of IV vancomycin, IV cefepime and IV azithromycin.   Assessment & Plan:   Principal Problem:   Sepsis ([place]) Active Problems:   Hepatic cirrhosis (HCC)   Atrial fibrillation ([place])   Essential hypertension   Prediabetes   Hypothyroidism   Obstructive sleep apnea   Acute lower UTI   Acute respiratory failure with hypoxia (HCC)   Leukopenia   RAD (reactive airway disease), mild intermittent, with acute exacerbation  #1 sepsis Patient presenting with right-sided flank pain radiation around the abdomen. In the ED patient was noted to have a persistent tachypnea as well as hypoxia noted with minimal ambulation with sats in the mid 80s. Chest x-ray obtained was worrisome for possible pneumonia. CT angiogram chest obtained was negative for PE however did have areas of multifocal atelectasis greater in the left lung. Urinalysis obtained had large leukocytes, nitrite negative, too numerous to count RBCs, too numerous to count WBCs.  Patient noted to be hypotensive with systolic blood pressures in the 80s initially on admission which has since improved with IV fluids and antibiotics. Tachycardia has improved. Initially patient noted with tachycardia, leukocytosis with a white count of 20.7, lactic acid  of 6.5, worsening AST and worsening renal function. Tachycardia improved, renal function trending down, lactic acid and pro calcitonin levels trending down. Blood cultures pending. Urine cultures with greater than 100,000 gram-negative rods with sensitivities pending. Continue empiric IV cefepime, IV vancomycin and IV azithromycin. Supportive care. Follow.  #2 acute hypoxic respiratory distress likely secondary to probable pneumonia Patient was admitted and being treated for concerns for probable pneumonia. Patient did endorse a productive cough. Sputum Gram stain and cultures are pending. Blood cultures pending. Influenza PCR negative. Respiratory panel PCR negative. Continue IV azithromycin, IV cefepime and IV vancomycin secondary to sepsis. Continue Mucinex, scheduled nebulizers, steroid taper, Pulmicort and Flonase. Follow.  #3 hypothyroidism TSH 0.928. Continue home dose Synthroid.  #4 urinary tract infection Urine cultures with greater than 100,000 gram-negative rods. Sensitivities pending. Continue IV cefepime.  #5  CHA2DS2Vasc score 3 Continue home regimen of Tambocor. Patient deemed not a anticoagulation candidate due to risk of bleeding secondary to cirrhosis. Follow.  #6 hypertension Patient not on any antihypertensive medications at home. Hypotension improved. Blood pressure currently stable.  #7 acute renal failure Likely secondary to a prerenal azotemia. Urine studies pending. Renal function trending down. Saline lock IV fluids and monitor closely.  #8 obstructive sleep apnea Doesn't tolerate CPAP. O2 at bedtime.  #9 history of cirrhosis/portal hypertension/esophageal varices Currently stable. Monitor closely with fluid resuscitation. Saline lock IV fluids.  #10 left upper extremity swelling Likely secondary to IV infiltration. Patient with noted pulses arm is warm. Keep left upper extremity elevated. Warm compresses. Doppler ultrasound of upper extremity.   DVT  prophylaxis: Lovenox Code Status: DO NOT RESUSCITATE Family Communication: Updated patient and friend at bedside. Disposition Plan:  In step down unit. Hopefully home once medically stable and improved.   Consultants:   None  Procedures:   Chest x-ray 04/11/2017  CT Angela chest 04/11/2017  CT renal stone total call 04/11/2017  Antimicrobials:   IV azithromycin 04/11/2017  IV cefepime 04/12/2017  IV Rocephin 04/11/2017>>> 04/12/2017  IV vancomycin 04/12/2017   Subjective: Patient awake alert denies any chest pain or shortness of breath. No abdominal pain. Patient states she's feeling better. Patient denies any flank pain. Patient with some productive cough. Patient asking when she can go home.  Objective: Vitals:   04/12/17 1100 04/12/17 1130 04/12/17 1212 04/12/17 1230  BP: (!) 103/40 (!) 100/49 (!) 89/51 (!) 92/48  Pulse: 90 92 74 89  Resp: 16 16 18 15   Temp:      TempSrc:      SpO2: 98% 98% 100% 98%  Weight:      Height:        Intake/Output Summary (Last 24 hours) at 04/12/2017 1340 Last data filed at 04/11/2017 2342 Gross per 24 hour  Intake 1300 ml  Output -  Net 1300 ml   Filed Weights   04/11/17 1629  Weight: 102.5 kg (226 lb)    Examination:  General exam: NAD Respiratory system: Bibasilar crackles. No wheezing. No rhonchi. Fair air movement.  Cardiovascular system: S1 & S2 heard, RRR. No JVD, murmurs, rubs, gallops or clicks. No pedal edema. Gastrointestinal system: Abdomen is nondistended, soft and nontender. No organomegaly or masses felt. Normal bowel sounds heard. Central nervous system: Alert and oriented. No focal neurological deficits. Extremities: Left upper extremity swelling. A bruise noted in the antecubital fossa of the left upper extremity. Skin: No rashes, lesions or ulcers Psychiatry: Judgement and insight appear normal. Mood & affect appropriate.     Data Reviewed: I have personally reviewed following labs and imaging  studies  CBC: Recent Labs  Lab 04/11/17 1522 04/12/17 0924  WBC 2.9* 20.7*  NEUTROABS 2.5 17.0*  HGB 14.5 13.1  HCT 43.6 40.5  MCV 95.0 96.0  PLT 204 716   Basic Metabolic Panel: Recent Labs  Lab 04/11/17 1522 04/12/17 0924  NA 140 137  K 4.7 4.0  CL 110 106  CO2 22 17*  GLUCOSE 113* 80  BUN 15 28*  CREATININE 0.79 2.02*  CALCIUM 9.4 9.1  MG  --  1.8  PHOS  --  5.5*   GFR: Estimated Creatinine Clearance: 28.9 mL/min (A) (by C-G formula based on SCr of 2.02 mg/dL (H)). Liver Function Tests: Recent Labs  Lab 04/11/17 1522 04/12/17 0924  AST 82* 175*  ALT 32 68*  ALKPHOS 106 64  BILITOT 2.2* 1.7*  PROT 6.8 6.1*  ALBUMIN 2.9* 2.5*   No results for input(s): LIPASE, AMYLASE in the last 168 hours. No results for input(s): AMMONIA in the last 168 hours. Coagulation Profile: Recent Labs  Lab 04/12/17 0923  INR 1.64   Cardiac Enzymes: No results for input(s): CKTOTAL, CKMB, CKMBINDEX, TROPONINI in the last 168 hours. BNP (last 3 results) No results for input(s): PROBNP in the last 8760 hours. HbA1C: Recent Labs    04/12/17 0924  HGBA1C 5.3   CBG: Recent Labs  Lab 04/12/17 1207  GLUCAP 74   Lipid Profile: No results for input(s): CHOL, HDL, LDLCALC, TRIG, CHOLHDL, LDLDIRECT in the last 72 hours. Thyroid Function Tests: Recent Labs    04/12/17 0924  TSH 1.583   Anemia Panel: No results for input(s): VITAMINB12, FOLATE, FERRITIN, TIBC, IRON, RETICCTPCT in the  last 72 hours. Sepsis Labs: Recent Labs  Lab 04/12/17 0923 04/12/17 0924  PROCALCITON  --  >150.00  LATICACIDVEN 6.5*  --     No results found for this or any previous visit (from the past 240 hour(s)).       Radiology Studies: Dg Chest 2 View  Result Date: 04/11/2017 CLINICAL DATA:  76 year old female with right-sided flank pain radiating to abdomen. EXAM: CHEST  2 VIEW COMPARISON:  10/01/2014 FINDINGS: Cardiomediastinal silhouette is slightly enlarged. Note is made of low lung  volumes with vascular congestion. Increased opacity at the left lung base concerning for focal infiltrate. No significant pleural effusions or pneumothorax. No acute osseous abnormalities. IMPRESSION: Markedly low lung volumes with left basilar opacity concerning for focal infiltrate. Follow-up to resolution recommended. Electronically Signed   By: Kristopher Oppenheim M.D.   On: 04/11/2017 17:41   Ct Angio Chest Pe W Or Wo Contrast  Result Date: 04/11/2017 CLINICAL DATA:  Shortness of breath. Tachypnea and oxygen desaturation when ambulating. EXAM: CT ANGIOGRAPHY CHEST WITH CONTRAST TECHNIQUE: Multidetector CT imaging of the chest was performed using the standard protocol during bolus administration of intravenous contrast. Multiplanar CT image reconstructions and MIPs were obtained to evaluate the vascular anatomy. CONTRAST:  100 cc Isovue 370 IV COMPARISON:  Chest radiograph earlier this day. Abdominal CT earlier this day. FINDINGS: Cardiovascular: There are no filling defects within the pulmonary arteries to the segmental level suggest pulmonary embolus. Cannot assess subsegmental branches due to contrast bolus timing and soft tissue attenuation from habitus. Mild multi chamber cardiomegaly. Atherosclerosis of the thoracic aorta. No evidence of dissection. No pericardial fluid. Mediastinum/Nodes: Small hiatal hernia with large paraesophageal varices. No enlarged mediastinal or hilar nodes, multiple small mediastinal nodes are seen. Visualized thyroid gland is diminutive. Lungs/Pleura: Subsegmental atelectasis in the lingula, right middle lobe and both lower lobes, greater on the left. No confluent consolidation. Linear subpleural opacities favoring atelectasis over septal thickening. No pleural fluid. Upper Abdomen: Characterized on abdominal CT today. Cirrhosis and splenomegaly. Small amount of ascites. Perigastric and paraesophageal varices. Musculoskeletal: There are no acute or suspicious osseous  abnormalities. Review of the MIP images confirms the above findings. IMPRESSION: 1. No central pulmonary embolus. 2. Mild cardiomegaly.  Aortic Atherosclerosis (ICD10-I70.0). 3. Multifocal atelectasis, greater in the left lung. No pleural fluid. 4. Cirrhosis, splenomegaly, paraesophageal and perigastric varices and small amount of abdominal ascites. Electronically Signed   By: Jeb Levering M.D.   On: 04/11/2017 23:38   Ct Renal Stone Study  Result Date: 04/11/2017 CLINICAL DATA:  complaints of right sided flank pain radiating around into abdomen. Pain 10/10. Nausea/dry heaving. Denies urinary symptoms. Patient was vomiting yellow bile while she was on CT table. EXAM: CT ABDOMEN AND PELVIS WITHOUT CONTRAST TECHNIQUE: Multidetector CT imaging of the abdomen and pelvis was performed following the standard protocol without IV contrast. COMPARISON:  10/02/2014 FINDINGS: Lower chest: No acute abnormality. Hepatobiliary: Small nodular liver consistent with advanced cirrhosis. Small cyst at the dome of the right lobe. No other liver mass or focal lesion. Status post cholecystectomy. No bile duct dilation. Pancreas: Several calcifications along the inferior margin of the pancreatic head. No pancreatic mass or inflammation. Spleen: Enlarged measuring 19 x 8 x 14 cm. No splenic mass or focal lesion. Adrenals/Urinary Tract: No adrenal masses. Mild dilation of the right intrarenal collecting system. Portions of the proximal right ureter mildly dilated. No right ureteral stone. No right renal mass or intrarenal stone. No left renal mass. No left hydronephrosis. Small  nonobstructing stone in the upper pole. Normal left ureter. Bladder is unremarkable. Stomach/Bowel: Status post right partial colectomy. Anastomosis staple line is noted in the right mid to lower abdomen. No colonic wall thickening. There are scattered diverticular the left colon without diverticulitis. Stomach is unremarkable. No small bowel obstruction or  convincing inflammation. Small bowel anastomosis staple line noted in the right mid to lower abdomen. Vascular/Lymphatic: There are numerous vascular collaterals in the upper abdomen reflecting portal venous hypertension. These include paraesophageal collaterals, stable when compared to the prior CT. Mild aortic atherosclerosis. There scattered prominent gastrohepatic and retroperitoneal lymph nodes none of which are pathologically enlarged by size criteria. Reproductive: Status post hysterectomy. No adnexal masses. Other: Small amount of ascites collects adjacent to the liver and spleen and between the leaves of the small bowel mesenteric. No abdominal wall hernia. Musculoskeletal: No fracture or acute finding. No osteoblastic or osteolytic lesions. IMPRESSION: 1. There is mild dilation of the right intrarenal collecting system and portions of the proximal to mid right ureter, but no evidence of a ureteral stone. The possibility of a non radiopaque ureteral stone or recent passage of a stone should be considered given the presenting history. 2. No other evidence of an acute abnormality. 3. There changes of advanced cirrhosis with portal venous hypertension reflected by extensive varices, splenomegaly and a small amount of ascites. Ascites has increased in amount from the prior CT. Other findings are stable. 4. Stable changes from bowel surgery. 5. Mild aortic atherosclerosis. Electronically Signed   By: Lajean Manes M.D.   On: 04/11/2017 16:05        Scheduled Meds: . budesonide (PULMICORT) nebulizer solution  0.5 mg Nebulization BID  . enoxaparin (LOVENOX) injection  40 mg Subcutaneous Q24H  . flecainide  100 mg Oral BID  . fluticasone  2 spray Each Nare Daily  . guaiFENesin  1,200 mg Oral BID  . insulin aspart  0-15 Units Subcutaneous TID WC  . insulin aspart  0-5 Units Subcutaneous QHS  . ipratropium-albuterol  3 mL Nebulization Q6H  . levothyroxine  112 mcg Oral QAC breakfast  . loratadine  10  mg Oral Daily  . methylPREDNISolone (SOLU-MEDROL) injection  40 mg Intravenous Q12H   Followed by  . [START ON 04/13/2017] predniSONE  40 mg Oral Q supper  . senna  1 tablet Oral BID   Continuous Infusions: . sodium chloride    . azithromycin    . cefTRIAXone (ROCEPHIN)  IV    . sodium chloride       LOS: 1 day    Time spent: 40 minutes    Hezakiah Champeau, MD [facility] Pager [phone]  If 7PM-7AM, please contact night-coverage [email or web address] Password [email or web address] 04/12/2017, 1:40 PM

## 2017-04-13 NOTE — Progress Notes (Signed)
PT Cancellation Note  Patient Details Name: FAWN DESROCHER MRN: [id number] DOB: 1940/07/20   Cancelled Treatment:    Reason Eval/Treat Not Completed: Fatigue/lethargy limiting ability to participate(per RN, pt is very fatigued from getting up to recliner earlier today, she requested PT hold for today and attempt again tomorrow. Will follow. )   Philomena Doheny 04/13/2017, 2:18 PM [phone]

## 2017-04-13 NOTE — Progress Notes (Signed)
LUE venous duplex prelim: negative for DVT and SVT. Wallice Granville Eunice, RDMS, RVT   

## 2017-04-14 ENCOUNTER — Inpatient Hospital Stay (HOSPITAL_COMMUNITY): Payer: PPO

## 2017-04-14 ENCOUNTER — Ambulatory Visit: Payer: Self-pay

## 2017-04-14 DIAGNOSIS — I5031 Acute diastolic (congestive) heart failure: Secondary | ICD-10-CM

## 2017-04-14 DIAGNOSIS — I10 Essential (primary) hypertension: Secondary | ICD-10-CM

## 2017-04-14 DIAGNOSIS — R740 Nonspecific elevation of levels of transaminase and lactic acid dehydrogenase [LDH]: Secondary | ICD-10-CM

## 2017-04-14 DIAGNOSIS — K7469 Other cirrhosis of liver: Secondary | ICD-10-CM

## 2017-04-14 DIAGNOSIS — I472 Ventricular tachycardia, unspecified: Secondary | ICD-10-CM

## 2017-04-14 DIAGNOSIS — I480 Paroxysmal atrial fibrillation: Secondary | ICD-10-CM

## 2017-04-14 DIAGNOSIS — R7401 Elevation of levels of liver transaminase levels: Secondary | ICD-10-CM | POA: Diagnosis present

## 2017-04-14 DIAGNOSIS — I272 Pulmonary hypertension, unspecified: Secondary | ICD-10-CM

## 2017-04-14 DIAGNOSIS — J9601 Acute respiratory failure with hypoxia: Secondary | ICD-10-CM

## 2017-04-14 DIAGNOSIS — G4733 Obstructive sleep apnea (adult) (pediatric): Secondary | ICD-10-CM

## 2017-04-14 DIAGNOSIS — A4151 Sepsis due to Escherichia coli [E. coli]: Secondary | ICD-10-CM

## 2017-04-14 DIAGNOSIS — E8770 Fluid overload, unspecified: Secondary | ICD-10-CM | POA: Diagnosis not present

## 2017-04-14 DIAGNOSIS — I340 Nonrheumatic mitral (valve) insufficiency: Secondary | ICD-10-CM

## 2017-04-14 LAB — CBC WITH DIFFERENTIAL/PLATELET
Basophils Absolute: 0 10*3/uL (ref 0.0–0.1)
Basophils Relative: 0 %
EOS ABS: 0 10*3/uL (ref 0.0–0.7)
EOS PCT: 0 %
HCT: 37.4 % (ref 36.0–46.0)
Hemoglobin: 12.5 g/dL (ref 12.0–15.0)
LYMPHS ABS: 0.8 10*3/uL (ref 0.7–4.0)
Lymphocytes Relative: 7 %
MCH: 30.9 pg (ref 26.0–34.0)
MCHC: 33.4 g/dL (ref 30.0–36.0)
MCV: 92.3 fL (ref 78.0–100.0)
MONO ABS: 0.9 10*3/uL (ref 0.1–1.0)
Monocytes Relative: 8 %
Neutro Abs: 9.4 10*3/uL — ABNORMAL HIGH (ref 1.7–7.7)
Neutrophils Relative %: 85 %
PLATELETS: 185 10*3/uL (ref 150–400)
RBC: 4.05 MIL/uL (ref 3.87–5.11)
RDW: 18.7 % — AB (ref 11.5–15.5)
WBC: 11.1 10*3/uL — AB (ref 4.0–10.5)

## 2017-04-14 LAB — MAGNESIUM: Magnesium: 2.1 mg/dL (ref 1.7–2.4)

## 2017-04-14 LAB — BASIC METABOLIC PANEL
Anion gap: 7 (ref 5–15)
BUN: 51 mg/dL — ABNORMAL HIGH (ref 6–20)
CALCIUM: 9.7 mg/dL (ref 8.9–10.3)
CO2: 19 mmol/L — AB (ref 22–32)
CREATININE: 1.03 mg/dL — AB (ref 0.44–1.00)
Chloride: 112 mmol/L — ABNORMAL HIGH (ref 101–111)
GFR calc non Af Amer: 53 mL/min — ABNORMAL LOW (ref >60)
GLUCOSE: 151 mg/dL — AB (ref 65–99)
Potassium: 4.6 mmol/L (ref 3.5–5.1)
Sodium: 138 mmol/L (ref 135–145)

## 2017-04-14 LAB — ECHOCARDIOGRAM LIMITED
HEIGHTINCHES: 65 in
WEIGHTICAEL: 4021.19 [oz_av]

## 2017-04-14 LAB — COMPREHENSIVE METABOLIC PANEL
ALT: 89 U/L — ABNORMAL HIGH (ref 14–54)
ANION GAP: 7 (ref 5–15)
AST: 132 U/L — AB (ref 15–41)
Albumin: 2.2 g/dL — ABNORMAL LOW (ref 3.5–5.0)
Alkaline Phosphatase: 51 U/L (ref 38–126)
BILIRUBIN TOTAL: 1.1 mg/dL (ref 0.3–1.2)
BUN: 50 mg/dL — AB (ref 6–20)
CHLORIDE: 110 mmol/L (ref 101–111)
CO2: 18 mmol/L — ABNORMAL LOW (ref 22–32)
Calcium: 9.2 mg/dL (ref 8.9–10.3)
Creatinine, Ser: 1.15 mg/dL — ABNORMAL HIGH (ref 0.44–1.00)
GFR, EST AFRICAN AMERICAN: 53 mL/min — AB (ref >60)
GFR, EST NON AFRICAN AMERICAN: 46 mL/min — AB (ref >60)
Glucose, Bld: 121 mg/dL — ABNORMAL HIGH (ref 65–99)
POTASSIUM: 4.9 mmol/L (ref 3.5–5.1)
Sodium: 135 mmol/L (ref 135–145)
TOTAL PROTEIN: 5.8 g/dL — AB (ref 6.5–8.1)

## 2017-04-14 LAB — GLUCOSE, CAPILLARY
GLUCOSE-CAPILLARY: 115 mg/dL — AB (ref 65–99)
GLUCOSE-CAPILLARY: 148 mg/dL — AB (ref 65–99)
Glucose-Capillary: 90 mg/dL (ref 65–99)
Glucose-Capillary: 118 mg/dL — ABNORMAL HIGH (ref 65–99)

## 2017-04-14 LAB — HEPATITIS PANEL, ACUTE
HCV AB: 0.1 {s_co_ratio} (ref 0.0–0.9)
HEP A IGM: NEGATIVE
HEP B C IGM: NEGATIVE
HEP B S AG: NEGATIVE

## 2017-04-14 LAB — TROPONIN I
TROPONIN I: 0.15 ng/mL — AB (ref <0.03)
Troponin I: 0.16 ng/mL (ref <0.03)

## 2017-04-14 LAB — LACTIC ACID, PLASMA: LACTIC ACID, VENOUS: 1.1 mmol/L (ref 0.5–1.9)

## 2017-04-14 LAB — URINE CULTURE: Culture: >=100000 — AB

## 2017-04-14 LAB — PROCALCITONIN: PROCALCITONIN: 55.06 ng/mL

## 2017-04-14 LAB — BRAIN NATRIURETIC PEPTIDE: B Natriuretic Peptide: 603.4 pg/mL — ABNORMAL HIGH (ref 0.0–100.0)

## 2017-04-14 LAB — TSH: TSH: 1.345 u[IU]/mL (ref 0.350–4.500)

## 2017-04-14 MED ORDER — AMIODARONE HCL IN DEXTROSE 360-4.14 MG/200ML-% IV SOLN
30.0000 mg/h | INTRAVENOUS | Status: DC
  Administered 2017-04-14 – 2017-04-16 (×4): 30 mg/h via INTRAVENOUS
  Filled 2017-04-14 (×4): qty 200

## 2017-04-14 MED ORDER — FUROSEMIDE 10 MG/ML IJ SOLN
40.0000 mg | Freq: Two times a day (BID) | INTRAMUSCULAR | Status: DC
  Administered 2017-04-14: 40 mg via INTRAVENOUS
  Filled 2017-04-14: qty 4

## 2017-04-14 MED ORDER — AMIODARONE IV BOLUS ONLY 150 MG/100ML
150.0000 mg | Freq: Once | INTRAVENOUS | Status: AC
  Administered 2017-04-14: 150 mg via INTRAVENOUS

## 2017-04-14 MED ORDER — AMIODARONE HCL IN DEXTROSE 360-4.14 MG/200ML-% IV SOLN
60.0000 mg/h | INTRAVENOUS | Status: DC
  Administered 2017-04-14: 60 mg/h via INTRAVENOUS
  Filled 2017-04-14: qty 200

## 2017-04-14 MED ORDER — AMIODARONE LOAD VIA INFUSION
150.0000 mg | Freq: Once | INTRAVENOUS | Status: DC
  Filled 2017-04-14: qty 83.34

## 2017-04-14 MED ORDER — METOPROLOL TARTRATE 5 MG/5ML IV SOLN
5.0000 mg | Freq: Once | INTRAVENOUS | Status: AC
  Administered 2017-04-14: 5 mg via INTRAVENOUS

## 2017-04-14 MED ORDER — METOPROLOL TARTRATE 5 MG/5ML IV SOLN
INTRAVENOUS | Status: AC
  Administered 2017-04-14: 5 mg via INTRAVENOUS
  Filled 2017-04-14: qty 5

## 2017-04-14 MED ORDER — FUROSEMIDE 10 MG/ML IJ SOLN: 40.0000 mg | Freq: Two times a day (BID) | INTRAMUSCULAR | Status: DC

## 2017-04-14 MED ORDER — FUROSEMIDE 10 MG/ML IJ SOLN
40.0000 mg | Freq: Four times a day (QID) | INTRAMUSCULAR | Status: DC
  Administered 2017-04-14 – 2017-04-17 (×12): 40 mg via INTRAVENOUS
  Filled 2017-04-14 (×12): qty 4

## 2017-04-14 MED ORDER — METOPROLOL TARTRATE 25 MG PO TABS
25.0000 mg | ORAL_TABLET | Freq: Two times a day (BID) | ORAL | Status: DC
  Administered 2017-04-14 – 2017-04-15 (×3): 25 mg via ORAL
  Filled 2017-04-14 (×3): qty 1

## 2017-04-14 MED ORDER — AMLODIPINE BESYLATE 5 MG PO TABS
2.5000 mg | ORAL_TABLET | Freq: Every day | ORAL | Status: DC
  Administered 2017-04-14 – 2017-04-17 (×4): 2.5 mg via ORAL
  Filled 2017-04-14 (×4): qty 1

## 2017-04-14 MED ORDER — AMIODARONE IV BOLUS ONLY 150 MG/100ML
INTRAVENOUS | Status: AC
  Administered 2017-04-14: 150 mg via INTRAVENOUS
  Filled 2017-04-14: qty 100

## 2017-04-14 MED FILL — Medication: Qty: 1 | Status: AC

## 2017-04-14 NOTE — Progress Notes (Signed)
PT Cancellation Note  Patient Details Name: Brittney Valentine MRN: [id number] DOB: Jul 17, 1940   Cancelled Treatment:    Reason Eval/Treat Not Completed: Medical issues which prohibited therapy(per RN pt had Vtach earlier today and is now on an amiodarone drip, will hold PT today and check back tomorrow. )   Philomena Doheny 04/14/2017, 1:46 PM [phone]

## 2017-04-14 NOTE — Consult Note (Signed)
   [facility] CM Inpatient Consult   04/14/2017  BREDA BOND 07-08-1940 [id number]    Patient screened for potential [facility] Care Management needs due having multiple co-morbidites.  Chart reviewed. Noted Mrs. Pelphrey is currently in stepdown unit.  Will continue to follow and engage when appropriate.     [name], MSN-Ed, RN,BSN [facility] Liaison [phone]

## 2017-04-14 NOTE — Consult Note (Signed)
ELECTROPHYSIOLOGY CONSULT NOTE    Primary Care Physician: Unk Pinto, MD Referring Physician:  Dr Meda Coffee  Admit Date: 04/11/2017  Reason for consultation:  Wide complex tachycardia  Brittney Valentine is a 76 y.o. female with a h/o multiple comoribidites including cirrhosis, OSA, paroxysmal atrial fibrillation, and HTN.  She is now admitted with hypoxic respiratory failure, pneumonia, and sepsis.  While here, the patient developed wide complex tachycardia.  The patient reports palpitations but otherwise tolerated this well.  With IV amiodarone, she converted to atrial fibrillation and subsequently sinus rhythm.  She has remained in sinus rhythm.  Flecainide has been discontinued. s he has been placed on metoprolol and amiodarone.  Currently, she is stable, without complaint.  Today, she denies symptoms of chest pain,  orthopnea, PND, lower extremity edema, dizziness, presyncope, syncope, or neurologic sequela. The patient is tolerating medications without difficulties and is otherwise without complaint today.   Past Medical History:  Diagnosis Date  . Anemia   . Blood transfusion without reported diagnosis   . Cirrhosis ([place])   . Degenerative disk disease   . Diverticulosis   . Hemorrhoids   . Hypertension   . Kidney stones   . Obesity   . Paroxysmal atrial fibrillation (HCC)   . Partial small bowel obstruction ([place])   . Personal history of colonic polyps 03/25/2012   8 mm rectal adenoma 03/2012  . Portal hypertensive gastropathy ([place])   . Shingles   . Thyroid disease   . Varices, esophageal ([place])   . Zoster    Past Surgical History:  Procedure Laterality Date  . ABDOMINAL HYSTERECTOMY    . APPENDECTOMY  1991  . CHOLECYSTECTOMY  2000  . COLON SURGERY    . COLON SURGERY    . COLONOSCOPY    . ESOPHAGOGASTRODUODENOSCOPY  multiple    . amiodarone  150 mg Intravenous Once  . amLODipine  2.5 mg Oral Daily  . budesonide (PULMICORT) nebulizer solution  0.5 mg Nebulization  BID  . enoxaparin (LOVENOX) injection  30 mg Subcutaneous Q24H  . fluticasone  2 spray Each Nare Daily  . furosemide  40 mg Intravenous Q6H  . guaiFENesin  1,200 mg Oral BID  . insulin aspart  0-15 Units Subcutaneous TID WC  . insulin aspart  0-5 Units Subcutaneous QHS  . levothyroxine  112 mcg Oral QAC breakfast  . loratadine  10 mg Oral Daily  . metoprolol tartrate  25 mg Oral BID  . predniSONE  40 mg Oral Q supper  . senna  1 tablet Oral BID  . sodium chloride flush  10-40 mL Intracatheter Q12H   . amiodarone 30 mg/hr (04/14/17 1934)  . ceFEPime (MAXIPIME) IV Stopped (04/14/17 1521)    Allergies  Allergen Reactions  . Ciprofloxacin     Swelling  . Robaxin [Methocarbamol]     palpiations  . Sulfonamide Derivatives     See little dots, skin feels like its burning  . Verapamil     palpitations    Social History   Socioeconomic History  . Marital status: Single    Spouse name: Not on file  . Number of children: Not on file  . Years of education: Not on file  . Highest education level: Not on file  Social Needs  . Financial resource strain: Not on file  . Food insecurity - worry: Not on file  . Food insecurity - inability: Not on file  . Transportation needs - medical: Not on file  . Transportation needs -  non-medical: Not on file  Occupational History  . Not on file  Tobacco Use  . Smoking status: Never Smoker  . Smokeless tobacco: Never Used  Substance and Sexual Activity  . Alcohol use: No  . Drug use: No  . Sexual activity: Not on file  Other Topics Concern  . Not on file  Social History Narrative  . Not on file    Family History  Problem Relation Age of Onset  . Heart failure Mother        died from  . Hypertension Mother   . Heart attack Father        died from  . Hypertension Sister     ROS- All systems are reviewed and negative except as per the HPI above  Physical Exam: Telemetry:  Sinus rhythm, wide complex tachycardia episode noted  followed by afib. Vitals:   04/14/17 1500 04/14/17 1600 04/14/17 1700 04/14/17 1914  BP: (!) 142/89 111/77    Pulse: 88 83 90   Resp: (!) 21 20 (!) 24   Temp:  97.8 F (36.6 C)  (!) 97.5 F (36.4 C)  TempSrc:  Oral  Axillary  SpO2: 97% 96% 94%   Weight:      Height:        GEN- The patient is overweight, chronically ill appearing, alert and oriented x 3 today.   Head- normocephalic, atraumatic Eyes-  Sclera clear, conjunctiva pink Ears- hearing intact Oropharynx- clear Neck- supple,   Lungs- Clear to ausculation bilaterally, normal work of breathing Heart- Regular rate and rhythm, no murmurs, rubs or gallops, PMI not laterally displaced GI- soft, NT, ND, + BS Extremities- no clubbing, cyanosis, + edema MS- no significant deformity or atrophy Skin- no rash or lesion Psych- euthymic mood, full affect Neuro- strength and sensation are intact  EKGs are reviewed,  She has a LBB superior axis tachycardia which appears to be VT.  Labs:   Lab Results  Component Value Date   WBC 11.1 (H) 04/14/2017   HGB 12.5 04/14/2017   HCT 37.4 04/14/2017   MCV 92.3 04/14/2017   PLT 185 04/14/2017    Recent Labs  Lab 04/14/17 0407 04/14/17 1125  NA 135 138  K 4.9 4.6  CL 110 112*  CO2 18* 19*  BUN 50* 51*  CREATININE 1.15* 1.03*  CALCIUM 9.2 9.7  PROT 5.8*  --   BILITOT 1.1  --   ALKPHOS 51  --   ALT 89*  --   AST 132*  --   GLUCOSE 121* 151*   Lab Results  Component Value Date   TROPONINI 0.16 (HH) 04/14/2017    Lab Results  Component Value Date   CHOL 182 02/16/2017   CHOL 174 12/26/2015   CHOL 171 12/21/2014   Lab Results  Component Value Date   HDL 81 02/16/2017   HDL 80 12/26/2015   HDL 79 12/21/2014   Lab Results  Component Value Date   LDLCALC 80 12/26/2015   LDLCALC 80 12/21/2014   LDLCALC 78 09/12/2014   Lab Results  Component Value Date   TRIG 69 02/16/2017   TRIG 68 12/26/2015   TRIG 62 12/21/2014   Lab Results  Component Value Date    CHOLHDL 2.2 02/16/2017   CHOLHDL 2.2 12/26/2015   CHOLHDL 2.2 12/21/2014   No results found for: LDLDIRECT     Echo:   EF today 35-40% (down from 65% 04/12/17)  ASSESSMENT AND PLAN:   1. Wide complex tachycardia EKG  is reviewed and is consistent with ventricular tachycardia.  This occurs in the setting of acute medical illness.  EF is acutely depressed and therefore ischemia should be considered as a possible cause.  Will defer further CV risk stratification to cardiology team.  In th setting of acute renal failure, doubt cath would be an option currently. Agree with initiate of beta blocker therapy and cessation of flecainide.  Unfortunately, in the setting of depressed EF, renal failure, and cirrhosis, our AAD options are very limited.  Currently, I think that amiodarone is our best option.  I would advise continued amiodarone until her acute medical illness is resolved.  Perhaps eventually, we could consider a different long term option (such as sotalol) if her renal function improves.  She is not a candidate for an ICD given her comorbidities, DNRI status, and advanced cirrhosis.  2. Paroxysmal atrial fibrillation Current episode exacerbated by acute medical illness Agree with cessation of flecainide given reduced EF and VT. With cirrhosis and varices, she is not a candidate for anticoagulation.  Very complicated patient with multiple acute medical issues and VT.  She is at risk for decompensation.  A high level of decision making was required for this encounter.  General cardiology to follow EP available as needed.   Thompson Grayer, MD 04/14/2017  10:39 PM

## 2017-04-14 NOTE — Progress Notes (Signed)
  Echocardiogram 2D Echocardiogram has been performed.  Merrie Roof F 04/14/2017, 3:11 PM

## 2017-04-14 NOTE — Plan of Care (Signed)
  Progressing Education: Knowledge of General Education information will improve 04/14/2017 1956 - Progressing by Naomie Dean, RN Health Behavior/Discharge Planning: Ability to manage health-related needs will improve 04/14/2017 1956 - Progressing by Naomie Dean, RN Clinical Measurements: Ability to maintain clinical measurements within normal limits will improve 04/14/2017 1956 - Progressing by Naomie Dean, RN Will remain free from infection 04/14/2017 1956 - Progressing by Naomie Dean, RN Diagnostic test results will improve 04/14/2017 1956 - Progressing by Naomie Dean, RN Respiratory complications will improve 04/14/2017 1956 - Progressing by Naomie Dean, RN Activity: Risk for activity intolerance will decrease 04/14/2017 1956 - Progressing by Naomie Dean, RN Nutrition: Adequate nutrition will be maintained 04/14/2017 1956 - Progressing by Naomie Dean, RN Coping: Level of anxiety will decrease 04/14/2017 1956 - Progressing by Naomie Dean, RN Elimination: Will not experience complications related to bowel motility 04/14/2017 1956 - Progressing by Naomie Dean, RN Will not experience complications related to urinary retention 04/14/2017 1956 - Progressing by Naomie Dean, RN Pain Managment: General experience of comfort will improve 04/14/2017 1956 - Progressing by Naomie Dean, RN Safety: Ability to remain free from injury will improve 04/14/2017 1956 - Progressing by Naomie Dean, RN Skin Integrity: Risk for impaired skin integrity will decrease 04/14/2017 1956 - Progressing by Naomie Dean, RN   Clinical Measurements: Cardiovascular complication will be avoided 04/14/2017 1956 - Not Progressing by Naomie Dean, RN  Pt had episode of WCT and went into A  fib. MD aware; seen by cardiologist. Monitoring.

## 2017-04-14 NOTE — Progress Notes (Signed)
Critical lab value troponin 0.15  04/14/2017 1222H  MD made aware; cardiologist saw patient.  Will continue to monitor.  Naomie Dean, RN

## 2017-04-14 NOTE — Progress Notes (Signed)
PROGRESS NOTE    Brittney Valentine  BTD:[id number] DOB: Apr 28, 1941 DOA: 04/11/2017 PCP: Unk Pinto, MD    Brief Narrative:  Patient is a 76 year old female history of cirrhosis and portal hypertension, esophageal varices, paroxysmal age of fibrillation on anticoagulation treated with flecainide, hypertension hypothyroidism presented to the ED with right-sided flank pain radiating around abdomen with a 10 out of 10 associated pain. Patient noted to have a fever with a temp of 100.1. Patient noted to have a UTI. Patient noted to be septic as she was hypotensive, tachycardic, worsening renal function, elevated lactic acid level and pro calcitonin levels. Patient placed on broad-spectrum antibiotics of IV vancomycin, IV cefepime and IV azithromycin. Urinalysis consistent with Escherichia coli UTI. IV vancomycin IV azithromycin discontinued. On 04/14/2017 patient noted to go to white complex tachycardia lasting approximately 23 minutes. Patient given IV Lopressor 5 mg 1 and subsequently IV amiodarone 150 mg 1 and then placed on amiodarone drip. Cardiology consulted.   Assessment & Plan:   Principal Problem:   Sepsis (Agency) Active Problems: Wide-complex tachycardia   Hepatic cirrhosis (HCC)   Atrial fibrillation (HCC)   Essential hypertension   Prediabetes   Hypothyroidism   Obstructive sleep apnea   Acute lower UTI   Acute respiratory failure with hypoxia (HCC)   Leukopenia   RAD (reactive airway disease), mild intermittent, with acute exacerbation  #1 sepsis Patient presenting with right-sided flank pain radiation around the abdomen. In the ED patient was noted to have a persistent tachypnea as well as hypoxia noted with minimal ambulation with sats in the mid 80s. Chest x-ray obtained was worrisome for possible pneumonia. CT angiogram chest obtained was negative for PE however did have areas of multifocal atelectasis greater in the left lung. Urinalysis obtained had large  leukocytes, nitrite negative, too numerous to count RBCs, too numerous to count WBCs.  Patient noted to be hypotensive with systolic blood pressures in the 80s initially on admission which has since improved with IV fluids and antibiotics. Tachycardia has improved. Initially patient noted with tachycardia, leukocytosis with a white count of 20.7, lactic acid of 6.5, worsening AST and worsening renal function. Tachycardia improved, renal function trending down, lactic acid and pro calcitonin levels trending down. Blood cultures pending. Urine cultures with greater than 100,000 E Coli which was resistant to ampicillin and Bactrim and ampicillin sulbactam. Sensitive to the cephalosporins. Discontinue IV azithromycin IV vancomycin. Continue IV cefepime. Supportive care. Follow.  #2 wide-complex tachycardia Was called per nursing that patient in V. tach with heart rates in the 140s. Patient with a sustained wide complex tachycardia with heart rates in the 140s worrisome for V. tach. Patient with complaints of a funny feeling in her chest. Patient tearing up. Patient given Lopressor 5 mg IV 1 with no significant change. Patient given amiodarone 150 mg IV 1 and placed on amiodarone drip. Patient also noted to have receded Tambocor this morning. Will repeat basic metabolic profile, check a magnesium level, check a TSH, check a 2-D echo, check a BNP, cycle cardiac enzymes every 6 hours 3. Repeat EKG. Repeat EKG done showing some morphology changes in leads V1 through V3. Patient converted from wide-complex tachycardia after about 20 minutes. Consult with cardiology for further evaluation and management.  #3 acute hypoxic respiratory distress likely secondary to probable pneumonia Patient was admitted and being treated for concerns for probable pneumonia. Patient did endorse a productive cough. Sputum Gram stain and cultures are pending. Blood cultures pending. Influenza PCR negative. Respiratory panel PCR  negative.  Continue  IV cefepime secondary to sepsis. Continue Mucinex, scheduled nebulizers, steroid taper, Pulmicort and Flonase. Discontinue IV azithromycin and IV vancomycin. Follow.  #4 hypothyroidism TSH 0.928. Continue home dose Synthroid.  #5 urinary tract infection Urine cultures with greater than 100,000 gram-negative rods. Sensitivities pending. Continue IV cefepime.  #6  CHA2DS2Vasc score 3 Continue home regimen of Tambocor. Patient deemed not a anticoagulation candidate due to risk of bleeding secondary to cirrhosis. Follow.  #7 hypertension Patient not on any antihypertensive medications at home. Patient has been placed on IV Lasix 40 mg IV every 12 hours 2 doses. Hypotension improved. Blood pressure currently stable.  #8 acute renal failure Likely secondary to a prerenal azotemia. Urine studies pending. Renal function trending down. Saline lock IV fluids and monitor closely.  #9 obstructive sleep apnea Doesn't tolerate CPAP. O2 at bedtime.  #10 history of cirrhosis/portal hypertension/esophageal varices Currently stable. Monitor closely with fluid resuscitation. Saline lock IV fluids.  #11 left upper extremity swelling Likely secondary to IV infiltration. Patient with noted pulses arm is warm. Swelling improvement. Doppler ultrasound done of left upper extremity negative for DVT or SVT. Continue to keep arm elevated and compresses.   #12 elevated blood pressure Patient noted to have elevated blood pressure. Patient not on any antihypertensive medications at home. Patient will be placed on IV Lasix for volume overload. Monitor blood pressure. If no improvement may consider starting patient on antihypertensive.  #13 volume overload Patient looks volume overloaded on examination with some bibasilar crackles and lower extremity edema. Patient with a 4.5 kg weight increase. Lasix 40 mg IV every 12 hours 2 doses. Strict I's and O's. Daily weights. Reassess in the morning.  #14  transaminitis Likely secondary to hypotension on admission. Improved with hydration. Acute hepatitis panel pending. Follow.   DVT prophylaxis: Lovenox Code Status: DO NOT RESUSCITATE Family Communication: Updated patient. No family at bedside. Disposition Plan: In step down unit. Hopefully home once medically stable and improved.   Consultants:   Cardiology pending 04/14/2017  Procedures:   Chest x-ray 04/11/2017  CT Angela chest 04/11/2017  CT renal stone total call 04/11/2017  Left upper extremity Dopplers 04/13/2017--- negative for DVT or SVT  Antimicrobials:   IV azithromycin 04/11/2017 >>>>>> 04/14/2017  IV cefepime 04/12/2017  IV Rocephin 04/11/2017>>> 04/12/2017  IV vancomycin 04/12/2017>>>>> 04/14/2017   Subjective: Patient states feeling better. Patient denies any shortness of breath however looks visibly short of breath. No chest pain. No abdominal pain. No flank pain. Was called by nursing on the floor the patient in V. tach with rates in the 140s.  Objective: Vitals:   04/12/17 1100 04/12/17 1130 04/12/17 1212 04/12/17 1230  BP: (!) 103/40 (!) 100/49 (!) 89/51 (!) 92/48  Pulse: 90 92 74 89  Resp: 16 16 18 15   Temp:      TempSrc:      SpO2: 98% 98% 100% 98%  Weight:      Height:        Intake/Output Summary (Last 24 hours) at 04/12/2017 1340 Last data filed at 04/11/2017 2342 Gross per 24 hour  Intake 1300 ml  Output -  Net 1300 ml   Filed Weights   04/11/17 1629  Weight: 102.5 kg (226 lb)    Examination:  General exam: Visibly short of breath. Respiratory system: Bibasilar crackles. Min-mild wheezing. No rhonchi. Some use of accessory muscles of respiration. Cardiovascular system: S1 & S2 heard, RRR. No JVD, murmurs, rubs, gallops or clicks. No pedal edema. Gastrointestinal  system: Abdomen is nondistended, soft and nontender. No organomegaly or masses felt. Normal bowel sounds heard. Central nervous system: Alert and oriented. No focal  neurological deficits. Extremities: Left upper extremity swelling decreasing. A bruise noted in the antecubital fossa of the left upper extremity. Skin: No rashes, lesions or ulcers Psychiatry: Judgement and insight appear normal. Mood & affect appropriate.     Data Reviewed: I have personally reviewed following labs and imaging studies  CBC: Recent Labs  Lab 04/11/17 1522 04/12/17 0924  WBC 2.9* 20.7*  NEUTROABS 2.5 17.0*  HGB 14.5 13.1  HCT 43.6 40.5  MCV 95.0 96.0  PLT 204 287   Basic Metabolic Panel: Recent Labs  Lab 04/11/17 1522 04/12/17 0924  NA 140 137  K 4.7 4.0  CL 110 106  CO2 22 17*  GLUCOSE 113* 80  BUN 15 28*  CREATININE 0.79 2.02*  CALCIUM 9.4 9.1  MG  --  1.8  PHOS  --  5.5*   GFR: Estimated Creatinine Clearance: 28.9 mL/min (A) (by C-G formula based on SCr of 2.02 mg/dL (H)). Liver Function Tests: Recent Labs  Lab 04/11/17 1522 04/12/17 0924  AST 82* 175*  ALT 32 68*  ALKPHOS 106 64  BILITOT 2.2* 1.7*  PROT 6.8 6.1*  ALBUMIN 2.9* 2.5*   No results for input(s): LIPASE, AMYLASE in the last 168 hours. No results for input(s): AMMONIA in the last 168 hours. Coagulation Profile: Recent Labs  Lab 04/12/17 0923  INR 1.64   Cardiac Enzymes: No results for input(s): CKTOTAL, CKMB, CKMBINDEX, TROPONINI in the last 168 hours. BNP (last 3 results) No results for input(s): PROBNP in the last 8760 hours. HbA1C: Recent Labs    04/12/17 0924  HGBA1C 5.3   CBG: Recent Labs  Lab 04/12/17 1207  GLUCAP 74   Lipid Profile: No results for input(s): CHOL, HDL, LDLCALC, TRIG, CHOLHDL, LDLDIRECT in the last 72 hours. Thyroid Function Tests: Recent Labs    04/12/17 0924  TSH 1.583   Anemia Panel: No results for input(s): VITAMINB12, FOLATE, FERRITIN, TIBC, IRON, RETICCTPCT in the last 72 hours. Sepsis Labs: Recent Labs  Lab 04/12/17 0923 04/12/17 0924  PROCALCITON  --  >150.00  LATICACIDVEN 6.5*  --     No results found for this or  any previous visit (from the past 240 hour(s)).       Radiology Studies: Dg Chest 2 View  Result Date: 04/11/2017 CLINICAL DATA:  76 year old female with right-sided flank pain radiating to abdomen. EXAM: CHEST  2 VIEW COMPARISON:  10/01/2014 FINDINGS: Cardiomediastinal silhouette is slightly enlarged. Note is made of low lung volumes with vascular congestion. Increased opacity at the left lung base concerning for focal infiltrate. No significant pleural effusions or pneumothorax. No acute osseous abnormalities. IMPRESSION: Markedly low lung volumes with left basilar opacity concerning for focal infiltrate. Follow-up to resolution recommended. Electronically Signed   By: Kristopher Oppenheim M.D.   On: 04/11/2017 17:41   Ct Angio Chest Pe W Or Wo Contrast  Result Date: 04/11/2017 CLINICAL DATA:  Shortness of breath. Tachypnea and oxygen desaturation when ambulating. EXAM: CT ANGIOGRAPHY CHEST WITH CONTRAST TECHNIQUE: Multidetector CT imaging of the chest was performed using the standard protocol during bolus administration of intravenous contrast. Multiplanar CT image reconstructions and MIPs were obtained to evaluate the vascular anatomy. CONTRAST:  100 cc Isovue 370 IV COMPARISON:  Chest radiograph earlier this day. Abdominal CT earlier this day. FINDINGS: Cardiovascular: There are no filling defects within the pulmonary arteries to the  segmental level suggest pulmonary embolus. Cannot assess subsegmental branches due to contrast bolus timing and soft tissue attenuation from habitus. Mild multi chamber cardiomegaly. Atherosclerosis of the thoracic aorta. No evidence of dissection. No pericardial fluid. Mediastinum/Nodes: Small hiatal hernia with large paraesophageal varices. No enlarged mediastinal or hilar nodes, multiple small mediastinal nodes are seen. Visualized thyroid gland is diminutive. Lungs/Pleura: Subsegmental atelectasis in the lingula, right middle lobe and both lower lobes, greater on the  left. No confluent consolidation. Linear subpleural opacities favoring atelectasis over septal thickening. No pleural fluid. Upper Abdomen: Characterized on abdominal CT today. Cirrhosis and splenomegaly. Small amount of ascites. Perigastric and paraesophageal varices. Musculoskeletal: There are no acute or suspicious osseous abnormalities. Review of the MIP images confirms the above findings. IMPRESSION: 1. No central pulmonary embolus. 2. Mild cardiomegaly.  Aortic Atherosclerosis (ICD10-I70.0). 3. Multifocal atelectasis, greater in the left lung. No pleural fluid. 4. Cirrhosis, splenomegaly, paraesophageal and perigastric varices and small amount of abdominal ascites. Electronically Signed   By: Jeb Levering M.D.   On: 04/11/2017 23:38   Ct Renal Stone Study  Result Date: 04/11/2017 CLINICAL DATA:  complaints of right sided flank pain radiating around into abdomen. Pain 10/10. Nausea/dry heaving. Denies urinary symptoms. Patient was vomiting yellow bile while she was on CT table. EXAM: CT ABDOMEN AND PELVIS WITHOUT CONTRAST TECHNIQUE: Multidetector CT imaging of the abdomen and pelvis was performed following the standard protocol without IV contrast. COMPARISON:  10/02/2014 FINDINGS: Lower chest: No acute abnormality. Hepatobiliary: Small nodular liver consistent with advanced cirrhosis. Small cyst at the dome of the right lobe. No other liver mass or focal lesion. Status post cholecystectomy. No bile duct dilation. Pancreas: Several calcifications along the inferior margin of the pancreatic head. No pancreatic mass or inflammation. Spleen: Enlarged measuring 19 x 8 x 14 cm. No splenic mass or focal lesion. Adrenals/Urinary Tract: No adrenal masses. Mild dilation of the right intrarenal collecting system. Portions of the proximal right ureter mildly dilated. No right ureteral stone. No right renal mass or intrarenal stone. No left renal mass. No left hydronephrosis. Small nonobstructing stone in the upper  pole. Normal left ureter. Bladder is unremarkable. Stomach/Bowel: Status post right partial colectomy. Anastomosis staple line is noted in the right mid to lower abdomen. No colonic wall thickening. There are scattered diverticular the left colon without diverticulitis. Stomach is unremarkable. No small bowel obstruction or convincing inflammation. Small bowel anastomosis staple line noted in the right mid to lower abdomen. Vascular/Lymphatic: There are numerous vascular collaterals in the upper abdomen reflecting portal venous hypertension. These include paraesophageal collaterals, stable when compared to the prior CT. Mild aortic atherosclerosis. There scattered prominent gastrohepatic and retroperitoneal lymph nodes none of which are pathologically enlarged by size criteria. Reproductive: Status post hysterectomy. No adnexal masses. Other: Small amount of ascites collects adjacent to the liver and spleen and between the leaves of the small bowel mesenteric. No abdominal wall hernia. Musculoskeletal: No fracture or acute finding. No osteoblastic or osteolytic lesions. IMPRESSION: 1. There is mild dilation of the right intrarenal collecting system and portions of the proximal to mid right ureter, but no evidence of a ureteral stone. The possibility of a non radiopaque ureteral stone or recent passage of a stone should be considered given the presenting history. 2. No other evidence of an acute abnormality. 3. There changes of advanced cirrhosis with portal venous hypertension reflected by extensive varices, splenomegaly and a small amount of ascites. Ascites has increased in amount from the prior CT. Other  findings are stable. 4. Stable changes from bowel surgery. 5. Mild aortic atherosclerosis. Electronically Signed   By: Lajean Manes M.D.   On: 04/11/2017 16:05        Scheduled Meds: . budesonide (PULMICORT) nebulizer solution  0.5 mg Nebulization BID  . enoxaparin (LOVENOX) injection  40 mg  Subcutaneous Q24H  . flecainide  100 mg Oral BID  . fluticasone  2 spray Each Nare Daily  . guaiFENesin  1,200 mg Oral BID  . insulin aspart  0-15 Units Subcutaneous TID WC  . insulin aspart  0-5 Units Subcutaneous QHS  . ipratropium-albuterol  3 mL Nebulization Q6H  . levothyroxine  112 mcg Oral QAC breakfast  . loratadine  10 mg Oral Daily  . methylPREDNISolone (SOLU-MEDROL) injection  40 mg Intravenous Q12H   Followed by  . [START ON 04/13/2017] predniSONE  40 mg Oral Q supper  . senna  1 tablet Oral BID   Continuous Infusions: . sodium chloride    . azithromycin    . cefTRIAXone (ROCEPHIN)  IV    . sodium chloride       LOS: 1 day    Time spent: 40 minutes    THOMPSON,DANIEL, MD [facility] Pager [phone]  If 7PM-7AM, please contact night-coverage [email or web address] Password [email or web address] 04/12/2017, 1:40 PM

## 2017-04-14 NOTE — Consult Note (Addendum)
Cardiology Consultation:   Patient ID: Brittney Valentine; [id number]; [birth year]   Admit date: 04/11/2017 Date of Consult: 04/14/2017  Primary Care Provider: Unk Pinto, MD Primary Cardiologist: Brittney Valentine, 04/2016 Primary Electrophysiologist:  n/a  Patient Profile:   Brittney Valentine is a 76 y.o. female with a hx of PAF on flecainide, no anticoagulation, intol metop and Cardizem, cirrhosis, HTN, OSA on nocturnal O2, anemia, pre-DM, hypothyroid, diverticulosis, portal hypertensive gastropathy, esoph varices who is being seen today for the evaluation of tachycardia at the request of Brittney Valentine.  History of Present Illness:   Brittney Valentine was admitted 11/03 with R flank pain, dx CAP, acute resp failure w/ hypoxia, reactive airway dz (started on Duo-nebs tid and steroids), UTI. She was continued on her flecainide.   Today, pt had a neb at 9:47, was felt volume overloaded on exam and got IV Lasix 40 mg plus Sennokot.   At 11:00 am, pt went into WCT, regular. She was symptomatic with this and had chest pain. Lopressor 5 mg given with a reduction in HR from the 140s to the 120s, but did not convert. She was given amio 150 mg IV and converted spontaneously to atrial fib, rate controlled, ECG newly abnormal. Cards asked to see.   Brittney Valentine has been on flecainide 10 or 15 years.  Brittney. Rosezella Valentine note mentions that she had problems with metoprolol or Cardizem.  It has been years since she has taken either 1 of those.  She thinks that she would get weak spells after she took them, but admits that the weak spells are associated with her atrial fibrillation.  The spells have increased in frequency over the last 6 months to a year.  She is now getting them about once a week.  She is to get them when she would take something for headache or cold.  She does not remember the names of those medications.  She would feel the fluttering feeling in her chest and get weak.  The symptoms would pass.  The  spells are not associated with exertion nor any particular time of day.  If she is not having 1 of her spells, she denies any chest pain or palpitations.  Vacuuming her house which takes about 20 minutes, is the most strenuous thing she does and she denies shortness of breath with that.  She does not exercise.  She has not had presyncope or syncope.  She has not had lower extremity edema, and denies orthopnea or PND.  She sleeps with oxygen.  Since she was admitted, she feels like she has been doing better.  Her breathing is better.  When the arrhythmia began, she had her usual palpitations and feeling weak. She got Lopressor 5 mg IV and HR slowed from the 140s>>120s. It was regular, WCT, no clear p waves. She got IV Amiodarone 150 mg and went from WCT>>atrial fib, HR 90s. The symptoms resolved once she converted to atrial fibrillation which she is in now.  Currently, she is not having any palpitations and is not aware of the arrhythmia.  She has never been anticoagulated for the A. fib.    Past Medical History:  Diagnosis Date  . Anemia   . Blood transfusion without reported diagnosis   . Cirrhosis ([place])   . Degenerative disk disease   . Diverticulosis   . Hemorrhoids   . Hypertension   . Kidney stones   . Obesity   . Paroxysmal atrial fibrillation (HCC)   . Partial  small bowel obstruction ([place])   . Personal history of colonic polyps 03/25/2012   8 mm rectal adenoma 03/2012  . Portal hypertensive gastropathy ([place])   . Shingles   . Thyroid disease   . Varices, esophageal ([place])   . Zoster     Past Surgical History:  Procedure Laterality Date  . ABDOMINAL HYSTERECTOMY    . APPENDECTOMY  1991  . CHOLECYSTECTOMY  2000  . COLON SURGERY    . COLON SURGERY    . COLONOSCOPY    . ESOPHAGOGASTRODUODENOSCOPY  multiple     Inpatient Medications: Scheduled Meds: . amiodarone  150 mg Intravenous Once  . budesonide (PULMICORT) nebulizer solution  0.5 mg Nebulization BID  . enoxaparin  (LOVENOX) injection  30 mg Subcutaneous Q24H  . flecainide  100 mg Oral BID  . fluticasone  2 spray Each Nare Daily  . furosemide  40 mg Intravenous Q12H  . guaiFENesin  1,200 mg Oral BID  . insulin aspart  0-15 Units Subcutaneous TID WC  . insulin aspart  0-5 Units Subcutaneous QHS  . ipratropium-albuterol  3 mL Nebulization TID  . levothyroxine  112 mcg Oral QAC breakfast  . loratadine  10 mg Oral Daily  . metoprolol tartrate      . metoprolol tartrate  5 mg Intravenous Once  . predniSONE  40 mg Oral Q supper  . senna  1 tablet Oral BID  . sodium chloride flush  10-40 mL Intracatheter Q12H   Continuous Infusions: . amiodarone     Followed by  . amiodarone    . amiodarone    . amiodarone    . ceFEPime (MAXIPIME) IV 2 g (04/13/17 1423)   PRN Meds: albuterol, bisacodyl, ondansetron **OR** ondansetron (ZOFRAN) IV, polyethylene glycol, sodium chloride flush Prior to Admission medications   Medication Sig Start Date End Date Taking? Authorizing Provider  Cholecalciferol (VITAMIN D-3) 1000 UNITS CAPS Take 1,000 Units by mouth 2 (two) times daily.    Yes [provider]  flecainide (TAMBOCOR) 100 MG tablet Take 1 tablet (100 mg total) by mouth 2 (two) times daily. 03/09/17  Yes Brittney Mutters, PA-C  levothyroxine (SYNTHROID, LEVOTHROID) 112 MCG tablet TAKE 1 TABLET BY MOUTH ONCE DAILY IN THE MORNING 03/09/17  Yes Brittney Mutters, PA-C  MAGNESIUM PO Take 1 tablet by mouth daily.   Yes [provider]  Omega-3 Fatty Acids (FISH OIL PO) Take 1 tablet by mouth daily.   Yes [provider]  nystatin (MYCOSTATIN/NYSTOP) powder Apply daily after a shower as needed Patient not taking: Reported on 04/11/2017 02/16/17   Brittney Mutters, PA-C  nystatin cream (MYCOSTATIN) Apply 1 application topically 2 (two) times daily. Patient not taking: Reported on 04/11/2017 02/16/17   Brittney Mutters, PA-C    Allergies:    Allergies  Allergen Reactions  . Ciprofloxacin      Swelling  . Robaxin [Methocarbamol]     palpiations  . Sulfonamide Derivatives     See little dots, skin feels like its burning  . Verapamil     palpitations    Social History:   Social History   Socioeconomic History  . Marital status: Single    Spouse name: Not on file  . Number of children: Not on file  . Years of education: Not on file  . Highest education level: Not on file  Social Needs  . Financial resource strain: Not on file  . Food insecurity - worry: Not on file  . Food insecurity - inability: Not  on file  . Transportation needs - medical: Not on file  . Transportation needs - non-medical: Not on file  Occupational History  . Not on file  Tobacco Use  . Smoking status: Never Smoker  . Smokeless tobacco: Never Used  Substance and Sexual Activity  . Alcohol use: No  . Drug use: No  . Sexual activity: Not on file  Other Topics Concern  . Not on file  Social History Narrative  . Not on file    Family History:   The patient's family history includes Heart attack in her father; Heart failure in her mother; Hypertension in her mother and sister. Pt indicated that her mother is deceased. She indicated that her father is deceased. She indicated that the status of her sister is unknown.   ROS:  Please see the history of present illness.  All other ROS reviewed and negative.      Physical Exam/Data:   Vitals:   04/14/17 0900 04/14/17 0947 04/14/17 0950 04/14/17 1000  BP: (!) 178/157  (!) 175/109 (!) 188/71  Pulse: 85  81 90  Resp: (!) 29  (!) 21 (!) 24  Temp:      TempSrc:      SpO2: 94% 93% 96% 93%  Weight:      Height:        Intake/Output Summary (Last 24 hours) at 04/14/2017 1127 Last data filed at 04/14/2017 1030 Gross per 24 hour  Intake 1404.17 ml  Output 510 ml  Net 894.17 ml   Filed Weights   04/11/17 1629 04/12/17 1642  Weight: 226 lb (102.5 kg) 241 lb 2.9 oz (109.4 kg)   Body mass index is 40.13 kg/m.  General:  Well nourished, well  developed, in no acute distress HEENT: normal Lymph: no adenopathy Neck: JVD difficult to assess secondary to body habitus but is estimated at 10 cm Endocrine:  No thryomegaly Vascular: No carotid bruits; 4/4 extremity pulses 2+  Cardiac:  normal S1, S2; RRR; faint murmur  Lungs: Decreased breath sounds bases with a few rales bilaterally, no wheezing, rhonchi   Abd: soft, nontender, no hepatomegaly  Ext: 1+ edema Musculoskeletal:  No deformities, BUE and BLE strength normal and equal Skin: warm and dry  Neuro:  CNs 2-12 intact, no focal abnormalities noted Psych:  Normal affect   EKG:  The EKG was personally reviewed and demonstrates: 11/06, 11:03 AM his wide-complex tachycardia, QRS duration 154 ms, left bundle branch block morphology, described as sinus rhythm but P waves are not clearly visible in multiple leads and arrhythmia was very sudden onset Telemetry:  Telemetry was personally reviewed and demonstrates:  SR>>WCT>>Afib  Relevant CV Studies:  ECHO: 04/12/2017 - Left ventricle: The cavity size was normal. Wall thickness was   increased in a pattern of mild LVH. Systolic function was   vigorous. The estimated ejection fraction was in the range of 65%   to 70%. Wall motion was normal; there were no regional wall   motion abnormalities. Left ventricular diastolic function   parameters were normal. - Mitral valve: Calcified annulus. - Left atrium: The atrium was mildly dilated. - Right ventricle: The cavity size was mildly dilated. - Right atrium: The atrium was mildly dilated. - Pulmonary arteries: Systolic pressure was moderately increased.   PA peak pressure: 61 mm Hg (S). Impressions: - Vigorous LV systolic function; elevated velocity across aortic   valve (2.5 m/s) likely due to vigorous LV function as aortic   valve appears to open well; mild  LAE/RAE/RVE; mild TR; moderate   pulmonary hypertension.  Laboratory Data:  Chemistry Recent Labs  Lab 04/12/17 0924  04/13/17 0130 04/13/17 0911 04/14/17 0407  NA 137 135  --  135  K 4.0 5.1 5.1 4.9  CL 106 109  --  110  CO2 17* 18*  --  18*  GLUCOSE 80 164*  --  121*  BUN 28* 40*  --  50*  CREATININE 2.02* 1.74*  --  1.15*  CALCIUM 9.1 8.3*  --  9.2  GFRNONAA 23* 28*  --  46*  GFRAA 27* 32*  --  53*  ANIONGAP 14 8  --  7    Recent Labs  Lab 04/12/17 0924 04/13/17 0130 04/14/17 0407  PROT 6.1* 5.6* 5.8*  ALBUMIN 2.5* 2.3* 2.2*  AST 175* 239* 132*  ALT 68* 109* 89*  ALKPHOS 64 53 51  BILITOT 1.7* 1.4* 1.1   Hematology Recent Labs  Lab 04/12/17 0924 04/13/17 0130 04/14/17 0407  WBC 20.7* 14.7* 11.1*  RBC 4.22 3.87 4.05  HGB 13.1 11.9* 12.5  HCT 40.5 36.7 37.4  MCV 96.0 94.8 92.3  MCH 31.0 30.7 30.9  MCHC 32.3 32.4 33.4  RDW 19.1* 19.3* 18.7*  PLT 193 150 185   Cardiac EnzymesNo results for input(s): TROPONINI in the last 168 hours. No results for input(s): TROPIPOC in the last 168 hours.  BNPNo results for input(s): BNP, PROBNP in the last 168 hours.  DDimer No results for input(s): DDIMER in the last 168 hours.  Radiology/Studies:  Dg Chest 2 View  Result Date: 04/11/2017 CLINICAL DATA:  76 year old female with right-sided flank pain radiating to abdomen. EXAM: CHEST  2 VIEW COMPARISON:  10/01/2014 FINDINGS: Cardiomediastinal silhouette is slightly enlarged. Note is made of low lung volumes with vascular congestion. Increased opacity at the left lung base concerning for focal infiltrate. No significant pleural effusions or pneumothorax. No acute osseous abnormalities. IMPRESSION: Markedly low lung volumes with left basilar opacity concerning for focal infiltrate. Follow-up to resolution recommended. Electronically Signed   By: Kristopher Oppenheim M.D.   On: 04/11/2017 17:41   Ct Angio Chest Pe W Or Wo Contrast  Result Date: 04/11/2017 CLINICAL DATA:  Shortness of breath. Tachypnea and oxygen desaturation when ambulating. EXAM: CT ANGIOGRAPHY CHEST WITH CONTRAST TECHNIQUE:  Multidetector CT imaging of the chest was performed using the standard protocol during bolus administration of intravenous contrast. Multiplanar CT image reconstructions and MIPs were obtained to evaluate the vascular anatomy. CONTRAST:  100 cc Isovue 370 IV COMPARISON:  Chest radiograph earlier this day. Abdominal CT earlier this day. FINDINGS: Cardiovascular: There are no filling defects within the pulmonary arteries to the segmental level suggest pulmonary embolus. Cannot assess subsegmental branches due to contrast bolus timing and soft tissue attenuation from habitus. Mild multi chamber cardiomegaly. Atherosclerosis of the thoracic aorta. No evidence of dissection. No pericardial fluid. Mediastinum/Nodes: Small hiatal hernia with large paraesophageal varices. No enlarged mediastinal or hilar nodes, multiple small mediastinal nodes are seen. Visualized thyroid gland is diminutive. Lungs/Pleura: Subsegmental atelectasis in the lingula, right middle lobe and both lower lobes, greater on the left. No confluent consolidation. Linear subpleural opacities favoring atelectasis over septal thickening. No pleural fluid. Upper Abdomen: Characterized on abdominal CT today. Cirrhosis and splenomegaly. Small amount of ascites. Perigastric and paraesophageal varices. Musculoskeletal: There are no acute or suspicious osseous abnormalities. Review of the MIP images confirms the above findings. IMPRESSION: 1. No central pulmonary embolus. 2. Mild cardiomegaly.  Aortic Atherosclerosis (ICD10-I70.0). 3. Multifocal atelectasis,  greater in the left lung. No pleural fluid. 4. Cirrhosis, splenomegaly, paraesophageal and perigastric varices and small amount of abdominal ascites. Electronically Signed   By: Jeb Levering M.D.   On: 04/11/2017 23:38   Dg Chest Port 1 View  Result Date: 04/14/2017 CLINICAL DATA:  Hypoxia. History of atrial fibrillation, cirrhosis, episodes of reactive airway disease and pneumonia. EXAM: PORTABLE  CHEST 1 VIEW COMPARISON:  Chest x-ray and chest CT scan of April 11, 2017 FINDINGS: The lungs are well-expanded. The cardiac silhouette is enlarged. The pulmonary vascularity is engorged and there is cephalization of the vascular pattern. The interstitial markings are more conspicuous today. There is no large pleural effusion. There is calcification in the wall of the aortic arch. The bony thorax exhibits no acute abnormality. IMPRESSION: CHF with worsening of pulmonary interstitial edema. No acute pneumonia. Thoracic aortic atherosclerosis. Electronically Signed   By: David  Martinique M.D.   On: 04/14/2017 10:16   Ct Renal Stone Study  Result Date: 04/11/2017 CLINICAL DATA:  complaints of right sided flank pain radiating around into abdomen. Pain 10/10. Nausea/dry heaving. Denies urinary symptoms. Patient was vomiting yellow bile while she was on CT table. EXAM: CT ABDOMEN AND PELVIS WITHOUT CONTRAST TECHNIQUE: Multidetector CT imaging of the abdomen and pelvis was performed following the standard protocol without IV contrast. COMPARISON:  10/02/2014 FINDINGS: Lower chest: No acute abnormality. Hepatobiliary: Small nodular liver consistent with advanced cirrhosis. Small cyst at the dome of the right lobe. No other liver mass or focal lesion. Status post cholecystectomy. No bile duct dilation. Pancreas: Several calcifications along the inferior margin of the pancreatic head. No pancreatic mass or inflammation. Spleen: Enlarged measuring 19 x 8 x 14 cm. No splenic mass or focal lesion. Adrenals/Urinary Tract: No adrenal masses. Mild dilation of the right intrarenal collecting system. Portions of the proximal right ureter mildly dilated. No right ureteral stone. No right renal mass or intrarenal stone. No left renal mass. No left hydronephrosis. Small nonobstructing stone in the upper pole. Normal left ureter. Bladder is unremarkable. Stomach/Bowel: Status post right partial colectomy. Anastomosis staple line is  noted in the right mid to lower abdomen. No colonic wall thickening. There are scattered diverticular the left colon without diverticulitis. Stomach is unremarkable. No small bowel obstruction or convincing inflammation. Small bowel anastomosis staple line noted in the right mid to lower abdomen. Vascular/Lymphatic: There are numerous vascular collaterals in the upper abdomen reflecting portal venous hypertension. These include paraesophageal collaterals, stable when compared to the prior CT. Mild aortic atherosclerosis. There scattered prominent gastrohepatic and retroperitoneal lymph nodes none of which are pathologically enlarged by size criteria. Reproductive: Status post hysterectomy. No adnexal masses. Other: Small amount of ascites collects adjacent to the liver and spleen and between the leaves of the small bowel mesenteric. No abdominal wall hernia. Musculoskeletal: No fracture or acute finding. No osteoblastic or osteolytic lesions. IMPRESSION: 1. There is mild dilation of the right intrarenal collecting system and portions of the proximal to mid right ureter, but no evidence of a ureteral stone. The possibility of a non radiopaque ureteral stone or recent passage of a stone should be considered given the presenting history. 2. No other evidence of an acute abnormality. 3. There changes of advanced cirrhosis with portal venous hypertension reflected by extensive varices, splenomegaly and a small amount of ascites. Ascites has increased in amount from the prior CT. Other findings are stable. 4. Stable changes from bowel surgery. 5. Mild aortic atherosclerosis. Electronically Signed   By: Shanon Brow  Ormond M.D.   On: 04/11/2017 16:05    Assessment and Plan:   1. WCT: Concern for VT (but seems too slow), atrial tach or atrial flutter (flutterwaves not seen). Most likely is atrial tach w/ aberrant conduction. - she responded to amiodarone - amiodarone will increase her flecainide level, if she is to stay on  that, should probably decrease the dose - may decide to use amio by itself but with cirrhosis and abnormal LFTs plus hypothyroid, it is not ideal - CHADS2VASC=3 (female, age x 1, HTN), need to discuss anticoagulation - She is having more frequent palpitations, flecainide may not be the optimal drug for her any more.  Otherwise, per IM. Agree with diuresis, she has some volume overload by exam. Principal Problem:   Sepsis ([place]) Active Problems:   Hepatic cirrhosis (HCC)   Atrial fibrillation (HCC)   Essential hypertension   Prediabetes   Hypothyroidism   Obstructive sleep apnea   Acute lower UTI   Acute respiratory failure with hypoxia (HCC)   Leukopenia   RAD (reactive airway disease), mild intermittent, with acute exacerbation   Transaminitis   Volume overload   Wide-complex tachycardia ([place])  Signed, Rosaria Ferries, PA-C  04/14/2017 11:27 AM   The patient was seen, examined and discussed with Rosaria Ferries, PA-C and I agree with the above.    76 y.o. female with a hx of PAF on flecainide, no anticoagulation, intol metop and Cardizem, cirrhosis, HTN, OSA on nocturnal O2, anemia, pre-DM, hypothyroid, diverticulosis, portal hypertensive gastropathy, esoph varices who is being seen today for the evaluation of wide complex tachycardia. She was admitted 11/03 for CAP, acute resp failure w/ hypoxia, reactive airway dz (started on Duo-nebs tid and steroids), UTI. She was continued on her flecainide. She was in sinus rhythm on admission and developed a-fib this am, that went into wide complex tachycardia with ventricular rate 140 BPM, that slowed down to 120 BPM with iv metoprolol and then broke into a-fib with wider QRS that on admission, controlled rate. Differential includes atrial tachy with aberrancy with LBBB, vs a-fib with aberrancy, vs slow VT (less likely).  She was started on amiodarone drip by Brittney Valentine.  She was feeling weak and dizzy during the episodes, no chest  pain. Echo on 04/12/2017 showed normal LVEF, normal diastolic function. She has severe pulmonary hypertension. She is severely hypertensive and fluid overloaded. I will increase lasix to 40 mg iv Q6H and add amlodipine 2.5 mg po daily.  I will discontinue flecainide, start metoprolol 25 mg po BID.  We will ask EP for further management of her arrhythmias.  Ena Dawley, MD 04/14/2017

## 2017-04-14 NOTE — Progress Notes (Signed)
OT Cancellation Note  Patient Details Name: Brittney Valentine MRN: [id number] DOB: June 04, 1941   Cancelled Treatment:    Reason Eval/Treat Not Completed: Medical issues which prohibited therapy.  PT checked with RN.  Will check back tomorrow.  Kekoa Fyock 04/14/2017, 2:02 PM  Lesle Chris, OTR/L [phone] 04/14/2017

## 2017-04-14 NOTE — Progress Notes (Signed)
Patient went into wide complex tachycardia at 1058H; RN to bedside and notified rapid response RN. MD notified and attended to bedside. Oders received and carried out- patient received lopressor X1 and amiodarone push X1. Amiodarone drip started. Patient converted from [facility] (which lasted about 23 mins) to A fib. Patient still in A fib. MD aware.   Will continue to monitor.  Naomie Dean, RN

## 2017-04-15 DIAGNOSIS — N390 Urinary tract infection, site not specified: Secondary | ICD-10-CM

## 2017-04-15 DIAGNOSIS — I472 Ventricular tachycardia: Secondary | ICD-10-CM

## 2017-04-15 DIAGNOSIS — I420 Dilated cardiomyopathy: Secondary | ICD-10-CM

## 2017-04-15 DIAGNOSIS — Z5309 Procedure and treatment not carried out because of other contraindication: Secondary | ICD-10-CM

## 2017-04-15 LAB — COMPREHENSIVE METABOLIC PANEL
ALK PHOS: 74 U/L (ref 38–126)
ALT: 80 U/L — ABNORMAL HIGH (ref 14–54)
ANION GAP: 7 (ref 5–15)
AST: 100 U/L — ABNORMAL HIGH (ref 15–41)
Albumin: 2.2 g/dL — ABNORMAL LOW (ref 3.5–5.0)
BILIRUBIN TOTAL: 1.3 mg/dL — AB (ref 0.3–1.2)
BUN: 40 mg/dL — ABNORMAL HIGH (ref 6–20)
CALCIUM: 9.2 mg/dL (ref 8.9–10.3)
CO2: 25 mmol/L (ref 22–32)
Chloride: 105 mmol/L (ref 101–111)
Creatinine, Ser: 1.08 mg/dL — ABNORMAL HIGH (ref 0.44–1.00)
GFR, EST AFRICAN AMERICAN: 58 mL/min — AB (ref >60)
GFR, EST NON AFRICAN AMERICAN: 50 mL/min — AB (ref >60)
Glucose, Bld: 149 mg/dL — ABNORMAL HIGH (ref 65–99)
Potassium: 4.1 mmol/L (ref 3.5–5.1)
SODIUM: 137 mmol/L (ref 135–145)
TOTAL PROTEIN: 6.1 g/dL — AB (ref 6.5–8.1)

## 2017-04-15 LAB — CBC WITH DIFFERENTIAL/PLATELET
BASOS ABS: 0 10*3/uL (ref 0.0–0.1)
BASOS PCT: 0 %
Eosinophils Absolute: 0 10*3/uL (ref 0.0–0.7)
Eosinophils Relative: 0 %
HEMATOCRIT: 41.4 % (ref 36.0–46.0)
HEMOGLOBIN: 14.4 g/dL (ref 12.0–15.0)
Lymphocytes Relative: 7 %
Lymphs Abs: 1 10*3/uL (ref 0.7–4.0)
MCH: 32.1 pg (ref 26.0–34.0)
MCHC: 34.8 g/dL (ref 30.0–36.0)
MCV: 92.2 fL (ref 78.0–100.0)
Monocytes Absolute: 0.4 10*3/uL (ref 0.1–1.0)
Monocytes Relative: 3 %
NEUTROS ABS: 12.6 10*3/uL — AB (ref 1.7–7.7)
NEUTROS PCT: 90 %
Platelets: 215 10*3/uL (ref 150–400)
RBC: 4.49 MIL/uL (ref 3.87–5.11)
RDW: 18.7 % — AB (ref 11.5–15.5)
WBC: 14 10*3/uL — ABNORMAL HIGH (ref 4.0–10.5)

## 2017-04-15 LAB — GLUCOSE, CAPILLARY
GLUCOSE-CAPILLARY: 78 mg/dL (ref 65–99)
GLUCOSE-CAPILLARY: 116 mg/dL — AB (ref 65–99)
Glucose-Capillary: 116 mg/dL — ABNORMAL HIGH (ref 65–99)
Glucose-Capillary: 89 mg/dL (ref 65–99)

## 2017-04-15 LAB — MAGNESIUM: MAGNESIUM: 1.7 mg/dL (ref 1.7–2.4)

## 2017-04-15 LAB — TROPONIN I: Troponin I: 0.14 ng/mL (ref <0.03)

## 2017-04-15 MED ORDER — METOPROLOL SUCCINATE ER 100 MG PO TB24
100.0000 mg | ORAL_TABLET | Freq: Every day | ORAL | Status: DC
  Administered 2017-04-15 – 2017-04-17 (×3): 100 mg via ORAL
  Filled 2017-04-15 (×2): qty 4
  Filled 2017-04-15: qty 1

## 2017-04-15 MED ORDER — ENOXAPARIN SODIUM 40 MG/0.4ML ~~LOC~~ SOLN
40.0000 mg | SUBCUTANEOUS | Status: DC
  Administered 2017-04-16 – 2017-04-17 (×2): 40 mg via SUBCUTANEOUS
  Filled 2017-04-15 (×2): qty 0.4

## 2017-04-15 MED ORDER — CEFAZOLIN SODIUM-DEXTROSE 2-4 GM/100ML-% IV SOLN
2.0000 g | Freq: Three times a day (TID) | INTRAVENOUS | Status: DC
  Administered 2017-04-15 – 2017-04-17 (×6): 2 g via INTRAVENOUS
  Filled 2017-04-15 (×11): qty 100

## 2017-04-15 MED ORDER — CEFAZOLIN SODIUM-DEXTROSE 2-4 GM/100ML-% IV SOLN
2.0000 g | Freq: Three times a day (TID) | INTRAVENOUS | Status: DC
  Filled 2017-04-15 (×2): qty 100

## 2017-04-15 NOTE — Progress Notes (Signed)
PROGRESS NOTE        PATIENT DETAILS Name: Brittney Valentine Age: 76 y.o. Sex: female Date of Birth: Dec 17, 1940 Admit Date: 04/11/2017 Admitting Physician Toy Baker, MD TDD:[id number], Gwyndolyn Saxon, MD  Brief Narrative: Patient is a 76 y.o. female with history of cirrhosis (NAS H) portal hypertension, esophageal varices, paroxysmal atrial fibrillation not on anticoagulation, hypertension admitted initially to the hospital for sepsis secondary to pneumonia/UTI. Hospital course complicated by sustained ventricular tachycardia. See below for further details.  Subjective: Feels better-does not have any chest pain or shortness of breath.   Assessment/Plan: Sepsis secondary to pneumonia and UTI: Sepsis pathophysiology has resolved, thought to be probably due to pneumonia and/or UTI. Cultures negative so far, urine culture positive for Escherichia coli sensitive to cefepime. Since improved-suspect we could narrow down to Ancef.  Acute on chronic hypoxic respiratory failure: Likely secondary to pneumonia, improving-slowly titrate off oxygen. On nocturnal O2 at home-I presume for OSA.  Sustained ventricular tachycardia: This occurred on 11/6-started on amiodarone infusion. Cardiology/EP following, per EP-not a candidate for ICD and advanced therapies, recommendations are to continue with amiodarone. She is no longer on flecainide. Will await further recommendations from cardiology service.  Acute on Chronic combined systolic and diastolic heart failure: Some improvement in her volume status-continue diuretics-follow intake/output and weights daily. Echocardiogram on 11/6 showed EF around 35-40%. Appreciate cardiology input.  Paroxysmal atrial fibrillation: Previously on flecainide-this has now been discontinued-patient has deemed not to be a candidate for anticoagulation due to bleeding risk-given history of cirrhosis. Chads2vasc score for at least 3. Beta blocker is being  continued.  Acute kidney injury: Likely hemodynamically mediated-much improved, continue supportive care.  Hypertension: Currently stable-we will continue to follow closely-continue amlodipine, metoprolol and adjust accordingly.  Hypothyroidism: Continue Synthroid  History of NASH/liver cirrhosis: Currently compensated-follows with Dr. Rayetta Pigg endoscopy in 2013 showed portal gastropathy and varices. Follow.  OSA: Unable to tolerate CPAP-apparently on nocturnal O2.  Left upper extremity swelling: Likely secondary to IV infiltration-Doppler on 11/5 negative for DVT. Continue supportive care, seems to be improving.  DVT Prophylaxis: Prophylactic Lovenox   Code Status:  DNR  Family Communication: None at bedside  Disposition Plan: Remain inpatient-remain in [facility] for 24 hours before considering transfer  Antimicrobial agents: Anti-infectives (From admission, onward)   Start     Dose/Rate Route Frequency Ordered Stop   04/14/17 1600  vancomycin (VANCOCIN) IVPB 1000 mg/200 mL premix  Status:  Discontinued     1,000 mg 200 mL/hr over 60 Minutes Intravenous Every 48 hours 04/12/17 1352 04/14/17 1127   04/12/17 2200  azithromycin (ZITHROMAX) 500 mg in dextrose 5 % 250 mL IVPB  Status:  Discontinued     500 mg 250 mL/hr over 60 Minutes Intravenous Every 24 hours 04/12/17 0316 04/14/17 1127   04/12/17 2200  cefTRIAXone (ROCEPHIN) 1 g in dextrose 5 % 50 mL IVPB  Status:  Discontinued     1 g 100 mL/hr over 30 Minutes Intravenous Every 24 hours 04/12/17 0316 04/12/17 1341   04/12/17 1500  vancomycin (VANCOCIN) 2,000 mg in sodium chloride 0.9 % 500 mL IVPB     2,000 mg 250 mL/hr over 120 Minutes Intravenous  Once 04/12/17 1352 04/12/17 1850   04/12/17 1400  ceFEPIme (MAXIPIME) 2 g in dextrose 5 % 50 mL IVPB     2 g 100 mL/hr over 30 Minutes  Intravenous Every 24 hours 04/12/17 1350     04/11/17 2030  cefTRIAXone (ROCEPHIN) 1 g in dextrose 5 % 50 mL IVPB     1 g 100 mL/hr over  30 Minutes Intravenous  Once 04/11/17 2017 04/12/17 1659   04/11/17 2030  azithromycin (ZITHROMAX) 500 mg in dextrose 5 % 250 mL IVPB     500 mg 250 mL/hr over 60 Minutes Intravenous  Once 04/11/17 2017 04/12/17 1659      Procedures: 11/6>> echocardiogram: - The patient was in atrial fibrillation. Normal LV size with mild   LV hypertrophy. EF 35-40%, diffuse hypokinesis. Normal RV size   and systolic function. Mild mitral regurgitation.  11/4>> echocardiogram: Vigorous LV systolic function; elevated velocity across aortic   valve (2.5 m/s) likely due to vigorous LV function as aortic   valve appears to open well; mild LAE/RAE/RVE; mild TR; moderate   pulmonary hypertension.   CONSULTS:  cardiology  Time spent: 25- minutes-Greater than 50% of this time was spent in counseling, explanation of diagnosis, planning of further management, and coordination of care.  MEDICATIONS: Scheduled Meds: . amiodarone  150 mg Intravenous Once  . amLODipine  2.5 mg Oral Daily  . budesonide (PULMICORT) nebulizer solution  0.5 mg Nebulization BID  . enoxaparin (LOVENOX) injection  30 mg Subcutaneous Q24H  . fluticasone  2 spray Each Nare Daily  . furosemide  40 mg Intravenous Q6H  . guaiFENesin  1,200 mg Oral BID  . insulin aspart  0-15 Units Subcutaneous TID WC  . insulin aspart  0-5 Units Subcutaneous QHS  . levothyroxine  112 mcg Oral QAC breakfast  . loratadine  10 mg Oral Daily  . metoprolol tartrate  25 mg Oral BID  . predniSONE  40 mg Oral Q supper  . senna  1 tablet Oral BID  . sodium chloride flush  10-40 mL Intracatheter Q12H   Continuous Infusions: . amiodarone 30 mg/hr (04/15/17 0746)  . ceFEPime (MAXIPIME) IV Stopped (04/14/17 1521)   PRN Meds:.albuterol, bisacodyl, ondansetron **OR** ondansetron (ZOFRAN) IV, polyethylene glycol, sodium chloride flush   PHYSICAL EXAM: Vital signs: Vitals:   04/15/17 0700 04/15/17 0800 04/15/17 0900 04/15/17 0956  BP: (!) 157/99  (!)  157/79 (!) 157/79  Pulse: 87  86 77  Resp: 14  18   Temp:  97.6 F (36.4 C)    TempSrc:  Oral    SpO2: 95%  93%   Weight:      Height:       Filed Weights   04/12/17 1642 04/14/17 1000 04/15/17 0254  Weight: 109.4 kg (241 lb 2.9 oz) 114 kg (251 lb 5.2 oz) 109.6 kg (241 lb 10 oz)   Body mass index is 40.21 kg/m.   General appearance :Awake, alert, not in any distress.  Eyes:, pupils equally reactive to light and accomodation HEENT: Atraumatic and Normocephalic Neck: supple, no JVD. No cervical lymphadenopathy.  Resp:Good air entry bilaterally, no added sounds  CVS: S1 S2 regular GI: Bowel sounds present, Non tender and not distended with no gaurding, rigidity or rebound.No organomegaly Extremities: B/L Lower Ext shows + edema, both legs are warm to touch Neurology:  speech clear,Non focal, sensation is grossly intact. Psychiatric: Normal judgment and insight. Alert and oriented x 3. Normal mood. Musculoskeletal:No digital cyanosis Skin:No Rash, warm and dry Wounds:N/A  I have personally reviewed following labs and imaging studies  LABORATORY DATA: CBC: Recent Labs  Lab 04/11/17 1522 04/12/17 0924 04/13/17 0130 04/14/17 0407 04/15/17 0330  WBC  2.9* 20.7* 14.7* 11.1* 14.0*  NEUTROABS 2.5 17.0* 12.5* 9.4* 12.6*  HGB 14.5 13.1 11.9* 12.5 14.4  HCT 43.6 40.5 36.7 37.4 41.4  MCV 95.0 96.0 94.8 92.3 92.2  PLT 204 193 150 185 725    Basic Metabolic Panel: Recent Labs  Lab 04/12/17 0924 04/13/17 0130 04/13/17 0911 04/14/17 0407 04/14/17 1125 04/15/17 0330  NA 137 135  --  135 138 137  K 4.0 5.1 5.1 4.9 4.6 4.1  CL 106 109  --  110 112* 105  CO2 17* 18*  --  18* 19* 25  GLUCOSE 80 164*  --  121* 151* 149*  BUN 28* 40*  --  50* 51* 40*  CREATININE 2.02* 1.74*  --  1.15* 1.03* 1.08*  CALCIUM 9.1 8.3*  --  9.2 9.7 9.2  MG 1.8 1.9  --   --  2.1 1.7  PHOS 5.5*  --   --   --   --   --     GFR: Estimated Creatinine Clearance: 57.1 mL/min (A) (by C-G formula based  on SCr of 1.08 mg/dL (H)).  Liver Function Tests: Recent Labs  Lab 04/11/17 1522 04/12/17 0924 04/13/17 0130 04/14/17 0407 04/15/17 0330  AST 82* 175* 239* 132* 100*  ALT 32 68* 109* 89* 80*  ALKPHOS 106 64 53 51 74  BILITOT 2.2* 1.7* 1.4* 1.1 1.3*  PROT 6.8 6.1* 5.6* 5.8* 6.1*  ALBUMIN 2.9* 2.5* 2.3* 2.2* 2.2*   No results for input(s): LIPASE, AMYLASE in the last 168 hours. No results for input(s): AMMONIA in the last 168 hours.  Coagulation Profile: Recent Labs  Lab 04/12/17 0923  INR 1.64    Cardiac Enzymes: Recent Labs  Lab 04/14/17 1125 04/14/17 1649 04/14/17 2322  TROPONINI 0.15* 0.16* 0.14*    BNP (last 3 results) No results for input(s): PROBNP in the last 8760 hours.  HbA1C: No results for input(s): HGBA1C in the last 72 hours.  CBG: Recent Labs  Lab 04/14/17 0744 04/14/17 1223 04/14/17 1615 04/14/17 2110 04/15/17 0745  GLUCAP 115* 148* 90 118* 116*    Lipid Profile: No results for input(s): CHOL, HDL, LDLCALC, TRIG, CHOLHDL, LDLDIRECT in the last 72 hours.  Thyroid Function Tests: Recent Labs    04/14/17 1125  TSH 1.345    Anemia Panel: No results for input(s): VITAMINB12, FOLATE, FERRITIN, TIBC, IRON, RETICCTPCT in the last 72 hours.  Urine analysis:    Component Value Date/Time   COLORURINE AMBER (A) 04/11/2017 1413   APPEARANCEUR CLOUDY (A) 04/11/2017 1413   LABSPEC 1.011 04/11/2017 1413   PHURINE 6.0 04/11/2017 1413   GLUCOSEU NEGATIVE 04/11/2017 1413   HGBUR LARGE (A) 04/11/2017 1413   BILIRUBINUR NEGATIVE 04/11/2017 1413   KETONESUR NEGATIVE 04/11/2017 1413   PROTEINUR 100 (A) 04/11/2017 1413   UROBILINOGEN 1 12/21/2014 1004   NITRITE NEGATIVE 04/11/2017 1413   LEUKOCYTESUR LARGE (A) 04/11/2017 1413    Sepsis Labs: Lactic Acid, Venous    Component Value Date/Time   LATICACIDVEN 1.1 04/14/2017 0407    MICROBIOLOGY: Recent Results (from the past 240 hour(s))  Urine culture     Status: Abnormal   Collection  Time: 04/11/17  9:14 PM  Result Value Ref Range Status   Specimen Description URINE, RANDOM  Final   Special Requests NONE  Final   Culture >=100,000 COLONIES/mL ESCHERICHIA COLI (A)  Final   Report Status 04/14/2017 FINAL  Final   Organism ID, Bacteria ESCHERICHIA COLI (A)  Final  Susceptibility   Escherichia coli - MIC*    AMPICILLIN >=32 RESISTANT Resistant     CEFAZOLIN <=4 SENSITIVE Sensitive     CEFTRIAXONE <=1 SENSITIVE Sensitive     CIPROFLOXACIN <=0.25 SENSITIVE Sensitive     GENTAMICIN <=1 SENSITIVE Sensitive     IMIPENEM <=0.25 SENSITIVE Sensitive     NITROFURANTOIN <=16 SENSITIVE Sensitive     TRIMETH/SULFA >=320 RESISTANT Resistant     AMPICILLIN/SULBACTAM 16 INTERMEDIATE Intermediate     PIP/TAZO <=4 SENSITIVE Sensitive     Extended ESBL NEGATIVE Sensitive     * >=100,000 COLONIES/mL ESCHERICHIA COLI  Respiratory Panel by PCR     Status: None   Collection Time: 04/12/17  3:11 AM  Result Value Ref Range Status   Adenovirus NOT DETECTED NOT DETECTED Final   Coronavirus 229E NOT DETECTED NOT DETECTED Final   Coronavirus HKU1 NOT DETECTED NOT DETECTED Final   Coronavirus NL63 NOT DETECTED NOT DETECTED Final   Coronavirus OC43 NOT DETECTED NOT DETECTED Final   Metapneumovirus NOT DETECTED NOT DETECTED Final   Rhinovirus / Enterovirus NOT DETECTED NOT DETECTED Final   Influenza A NOT DETECTED NOT DETECTED Final   Influenza B NOT DETECTED NOT DETECTED Final   Parainfluenza Virus 1 NOT DETECTED NOT DETECTED Final   Parainfluenza Virus 2 NOT DETECTED NOT DETECTED Final   Parainfluenza Virus 3 NOT DETECTED NOT DETECTED Final   Parainfluenza Virus 4 NOT DETECTED NOT DETECTED Final   Respiratory Syncytial Virus NOT DETECTED NOT DETECTED Final   Bordetella pertussis NOT DETECTED NOT DETECTED Final   Chlamydophila pneumoniae NOT DETECTED NOT DETECTED Final   Mycoplasma pneumoniae NOT DETECTED NOT DETECTED Final    Comment: Performed at [facility], [place]. [address] [place], [place] [address]  Culture, blood (Routine X 2) w Reflex to ID Panel     Status: None (Preliminary result)   Collection Time: 04/12/17  1:42 PM  Result Value Ref Range Status   Specimen Description BLOOD BLOOD RIGHT ARM  Final   Special Requests   Final    BOTTLES DRAWN AEROBIC AND ANAEROBIC Blood Culture adequate volume   Culture   Final    NO GROWTH 2 DAYS Performed at [facility], [place] [address], [place], [place] [address]    Report Status PENDING  Incomplete  Culture, blood (Routine X 2) w Reflex to ID Panel     Status: None (Preliminary result)   Collection Time: 04/12/17  1:42 PM  Result Value Ref Range Status   Specimen Description BLOOD RIGHT ANTECUBITAL  Final   Special Requests IN PEDIATRIC BOTTLE Blood Culture adequate volume  Final   Culture   Final    NO GROWTH 2 DAYS Performed at [facility], [place] [address] [place], [place] [address]    Report Status PENDING  Incomplete  MRSA PCR Screening     Status: None   Collection Time: 04/12/17  5:22 PM  Result Value Ref Range Status   MRSA by PCR NEGATIVE NEGATIVE Final    Comment:        The GeneXpert MRSA Assay (FDA approved for NASAL specimens only), is one component of a comprehensive MRSA colonization surveillance program. It is not intended to diagnose MRSA infection nor to guide or monitor treatment for MRSA infections.     RADIOLOGY STUDIES/RESULTS: Dg Chest 2 View  Result Date: 04/11/2017 CLINICAL DATA:  76 year old female with right-sided flank pain radiating to abdomen. EXAM: CHEST  2 VIEW COMPARISON:  10/01/2014 FINDINGS: Cardiomediastinal silhouette is slightly enlarged. Note is made of low lung volumes with vascular congestion. Increased opacity at the left lung base concerning for focal infiltrate. No significant pleural effusions or pneumothorax. No acute osseous abnormalities. IMPRESSION: Markedly low lung volumes with left basilar opacity concerning for focal infiltrate. Follow-up  to resolution recommended. Electronically Signed   By: Kristopher Oppenheim M.D.   On: 04/11/2017 17:41   Ct Angio Chest Pe W Or Wo Contrast  Result Date: 04/11/2017 CLINICAL DATA:  Shortness of breath. Tachypnea and oxygen desaturation when ambulating. EXAM: CT ANGIOGRAPHY CHEST WITH CONTRAST TECHNIQUE: Multidetector CT imaging of the chest was performed using the standard protocol during bolus administration of intravenous contrast. Multiplanar CT image reconstructions and MIPs were obtained to evaluate the vascular anatomy. CONTRAST:  100 cc Isovue 370 IV COMPARISON:  Chest radiograph earlier this day. Abdominal CT earlier this day. FINDINGS: Cardiovascular: There are no filling defects within the pulmonary arteries to the segmental level suggest pulmonary embolus. Cannot assess subsegmental branches due to contrast bolus timing and soft tissue attenuation from habitus. Mild multi chamber cardiomegaly. Atherosclerosis of the thoracic aorta. No evidence of dissection. No pericardial fluid. Mediastinum/Nodes: Small hiatal hernia with large paraesophageal varices. No enlarged mediastinal or hilar nodes, multiple small mediastinal nodes are seen. Visualized thyroid gland is diminutive. Lungs/Pleura: Subsegmental atelectasis in the lingula, right middle lobe and both lower lobes, greater on the left. No confluent consolidation. Linear subpleural opacities favoring atelectasis over septal thickening. No pleural fluid. Upper Abdomen: Characterized on abdominal CT today. Cirrhosis and splenomegaly. Small amount of ascites. Perigastric and paraesophageal varices. Musculoskeletal: There are no acute or suspicious osseous abnormalities. Review of the MIP images confirms the above findings. IMPRESSION: 1. No central pulmonary embolus. 2. Mild cardiomegaly.  Aortic Atherosclerosis (ICD10-I70.0). 3. Multifocal atelectasis, greater in the left lung. No pleural fluid. 4. Cirrhosis, splenomegaly, paraesophageal and perigastric  varices and small amount of abdominal ascites. Electronically Signed   By: Jeb Levering M.D.   On: 04/11/2017 23:38   Dg Chest Port 1 View  Result Date: 04/14/2017 CLINICAL DATA:  Hypoxia. History of atrial fibrillation, cirrhosis, episodes of reactive airway disease and pneumonia. EXAM: PORTABLE CHEST 1 VIEW COMPARISON:  Chest x-ray and chest CT scan of April 11, 2017 FINDINGS: The lungs are well-expanded. The cardiac silhouette is enlarged. The pulmonary vascularity is engorged and there is cephalization of the vascular pattern. The interstitial markings are more conspicuous today. There is no large pleural effusion. There is calcification in the wall of the aortic arch. The bony thorax exhibits no acute abnormality. IMPRESSION: CHF with worsening of pulmonary interstitial edema. No acute pneumonia. Thoracic aortic atherosclerosis. Electronically Signed   By: David  Martinique M.D.   On: 04/14/2017 10:16   Ct Renal Stone Study  Result Date: 04/11/2017 CLINICAL DATA:  complaints of right sided flank pain radiating around into abdomen. Pain 10/10. Nausea/dry heaving. Denies urinary symptoms. Patient was vomiting yellow bile while she was on CT table. EXAM: CT ABDOMEN AND PELVIS WITHOUT CONTRAST TECHNIQUE: Multidetector CT imaging of the abdomen and pelvis was performed following the standard protocol without IV contrast. COMPARISON:  10/02/2014 FINDINGS: Lower chest: No acute abnormality. Hepatobiliary: Small nodular liver consistent with advanced cirrhosis. Small cyst at the dome of the right lobe. No other liver mass or focal lesion. Status post cholecystectomy. No bile duct dilation. Pancreas: Several calcifications along the inferior margin of the pancreatic head. No pancreatic mass or inflammation. Spleen: Enlarged measuring 19 x 8 x  14 cm. No splenic mass or focal lesion. Adrenals/Urinary Tract: No adrenal masses. Mild dilation of the right intrarenal collecting system. Portions of the proximal right  ureter mildly dilated. No right ureteral stone. No right renal mass or intrarenal stone. No left renal mass. No left hydronephrosis. Small nonobstructing stone in the upper pole. Normal left ureter. Bladder is unremarkable. Stomach/Bowel: Status post right partial colectomy. Anastomosis staple line is noted in the right mid to lower abdomen. No colonic wall thickening. There are scattered diverticular the left colon without diverticulitis. Stomach is unremarkable. No small bowel obstruction or convincing inflammation. Small bowel anastomosis staple line noted in the right mid to lower abdomen. Vascular/Lymphatic: There are numerous vascular collaterals in the upper abdomen reflecting portal venous hypertension. These include paraesophageal collaterals, stable when compared to the prior CT. Mild aortic atherosclerosis. There scattered prominent gastrohepatic and retroperitoneal lymph nodes none of which are pathologically enlarged by size criteria. Reproductive: Status post hysterectomy. No adnexal masses. Other: Small amount of ascites collects adjacent to the liver and spleen and between the leaves of the small bowel mesenteric. No abdominal wall hernia. Musculoskeletal: No fracture or acute finding. No osteoblastic or osteolytic lesions. IMPRESSION: 1. There is mild dilation of the right intrarenal collecting system and portions of the proximal to mid right ureter, but no evidence of a ureteral stone. The possibility of a non radiopaque ureteral stone or recent passage of a stone should be considered given the presenting history. 2. No other evidence of an acute abnormality. 3. There changes of advanced cirrhosis with portal venous hypertension reflected by extensive varices, splenomegaly and a small amount of ascites. Ascites has increased in amount from the prior CT. Other findings are stable. 4. Stable changes from bowel surgery. 5. Mild aortic atherosclerosis. Electronically Signed   By: Lajean Manes M.D.    On: 04/11/2017 16:05     LOS: 4 days   Oren Binet, MD  [facility] Pager:[phone]  If 7PM-7AM, please contact night-coverage [email or web address] Password [email or web address] 04/15/2017, 11:18 AM

## 2017-04-15 NOTE — Progress Notes (Signed)
Pharmacy Antibiotic Note  Brittney Valentine is a 76 y.o. female admitted on 04/11/2017 with sepsis.  Pharmacy has been consulted for vancomycin and cefepime dosing. Subsequently narrowed to cefepime alone.  Urine culture with susceptible e. Coli.  Narrow cefepime to cefazolin.   Today, 04/15/2017 - Day #4 abx - Renal: SCr stable - WBC mildly elevated - afebrile  Plan:   Cefazolin 2gm IV q8h, use more aggressive dosing for obesity and resolving sepsis  Pharmacy will follow at distance  Height: 5\' 5"  (165.1 cm) Weight: 241 lb 10 oz (109.6 kg) IBW/kg (Calculated) : 57  Temp (24hrs), Avg:97.7 F (36.5 C), Min:97.5 F (36.4 C), Max:98 F (36.7 C)  Recent Labs  Lab 04/11/17 1522  04/12/17 0924 04/12/17 1342 04/12/17 1914 04/12/17 2147 04/13/17 0130 04/14/17 0407 04/14/17 1125 04/15/17 0330  WBC 2.9*  --  20.7*  --   --   --  14.7* 11.1*  --  14.0*  CREATININE 0.79  --  2.02*  --   --   --  1.74* 1.15* 1.03* 1.08*  LATICACIDVEN  --    < >  --  4.8* 5.5* 4.0* 2.3* 1.1  --   --    < > = values in this interval not displayed.    Estimated Creatinine Clearance: 57.1 mL/min (A) (by C-G formula based on SCr of 1.08 mg/dL (H)).    Allergies  Allergen Reactions  . Ciprofloxacin     Swelling  . Robaxin [Methocarbamol]     palpiations  . Sulfonamide Derivatives     See little dots, skin feels like its burning  . Verapamil     palpitations    Antimicrobials this admission: 11/3 azith  >> 11/6 11/3 rocephin x 1 11/4 vanc >> 11/6 11/4 cefepime >> 11/7 11/7 cefazolin >>  Microbiology results: 11/4 BCx2: ngtd 11/4 resp panel: neg 11/3 Ucx: > 100k E coli R Unasyn, Bactrim, S-Ancef 11/4 flu neg 11/3 + UA 11/4 MRSA PCR: neg  Thank you for allowing pharmacy to be a part of this patient's care.  Doreene Eland, PharmD, BCPS.   Pager: [phone] 04/15/2017 12:07 PM

## 2017-04-15 NOTE — Progress Notes (Signed)
Progress Note  Patient Name: Brittney Valentine Date of Encounter: 04/15/2017  Primary Cardiologist: Dr Percival Spanish  Subjective   She feels better today and was able to walk.  Inpatient Medications    Scheduled Meds: . amiodarone  150 mg Intravenous Once  . amLODipine  2.5 mg Oral Daily  . budesonide (PULMICORT) nebulizer solution  0.5 mg Nebulization BID  . enoxaparin (LOVENOX) injection  30 mg Subcutaneous Q24H  . fluticasone  2 spray Each Nare Daily  . furosemide  40 mg Intravenous Q6H  . guaiFENesin  1,200 mg Oral BID  . insulin aspart  0-15 Units Subcutaneous TID WC  . insulin aspart  0-5 Units Subcutaneous QHS  . levothyroxine  112 mcg Oral QAC breakfast  . loratadine  10 mg Oral Daily  . metoprolol tartrate  25 mg Oral BID  . predniSONE  40 mg Oral Q supper  . senna  1 tablet Oral BID  . sodium chloride flush  10-40 mL Intracatheter Q12H   Continuous Infusions: . amiodarone 30 mg/hr (04/15/17 0746)  . ceFEPime (MAXIPIME) IV Stopped (04/14/17 1521)   PRN Meds: albuterol, bisacodyl, ondansetron **OR** ondansetron (ZOFRAN) IV, polyethylene glycol, sodium chloride flush   Vital Signs    Vitals:   04/15/17 0700 04/15/17 0800 04/15/17 0900 04/15/17 0956  BP: (!) 157/99  (!) 157/79 (!) 157/79  Pulse: 87  86 77  Resp: 14  18   Temp:  97.6 F (36.4 C)    TempSrc:  Oral    SpO2: 95%  93%   Weight:      Height:        Intake/Output Summary (Last 24 hours) at 04/15/2017 1004 Last data filed at 04/15/2017 0600 Gross per 24 hour  Intake 330 ml  Output 5290 ml  Net -4960 ml   Filed Weights   04/12/17 1642 04/14/17 1000 04/15/17 0254  Weight: 241 lb 2.9 oz (109.4 kg) 251 lb 5.2 oz (114 kg) 241 lb 10 oz (109.6 kg)    Telemetry     a-fib, rate controlled- Personally Reviewed  Physical Exam   GEN: No acute distress.   Neck: No JVD Cardiac: RRR, no murmurs, rubs, or gallops.  Respiratory: Clear to auscultation bilaterally. GI: Soft, nontender, non-distended    MS: No edema; No deformity. Neuro:  Nonfocal  Psych: Normal affect   Labs    Chemistry Recent Labs  Lab 04/13/17 0130  04/14/17 0407 04/14/17 1125 04/15/17 0330  NA 135  --  135 138 137  K 5.1   < > 4.9 4.6 4.1  CL 109  --  110 112* 105  CO2 18*  --  18* 19* 25  GLUCOSE 164*  --  121* 151* 149*  BUN 40*  --  50* 51* 40*  CREATININE 1.74*  --  1.15* 1.03* 1.08*  CALCIUM 8.3*  --  9.2 9.7 9.2  PROT 5.6*  --  5.8*  --  6.1*  ALBUMIN 2.3*  --  2.2*  --  2.2*  AST 239*  --  132*  --  100*  ALT 109*  --  89*  --  80*  ALKPHOS 53  --  51  --  74  BILITOT 1.4*  --  1.1  --  1.3*  GFRNONAA 28*  --  46* 53* 50*  GFRAA 32*  --  53* >60 58*  ANIONGAP 8  --  7 7 7    < > = values in this interval not displayed.  Hematology Recent Labs  Lab 04/13/17 0130 04/14/17 0407 04/15/17 0330  WBC 14.7* 11.1* 14.0*  RBC 3.87 4.05 4.49  HGB 11.9* 12.5 14.4  HCT 36.7 37.4 41.4  MCV 94.8 92.3 92.2  MCH 30.7 30.9 32.1  MCHC 32.4 33.4 34.8  RDW 19.3* 18.7* 18.7*  PLT 150 185 215    Cardiac Enzymes Recent Labs  Lab 04/14/17 1125 04/14/17 1649 04/14/17 2322  TROPONINI 0.15* 0.16* 0.14*   No results for input(s): TROPIPOC in the last 168 hours.   BNP Recent Labs  Lab 04/14/17 1125  BNP 603.4*     DDimer No results for input(s): DDIMER in the last 168 hours.   Radiology    Dg Chest Port 1 View  Result Date: 04/14/2017 CLINICAL DATA:  Hypoxia. History of atrial fibrillation, cirrhosis, episodes of reactive airway disease and pneumonia. EXAM: PORTABLE CHEST 1 VIEW COMPARISON:  Chest x-ray and chest CT scan of April 11, 2017 FINDINGS: The lungs are well-expanded. The cardiac silhouette is enlarged. The pulmonary vascularity is engorged and there is cephalization of the vascular pattern. The interstitial markings are more conspicuous today. There is no large pleural effusion. There is calcification in the wall of the aortic arch. The bony thorax exhibits no acute abnormality.  IMPRESSION: CHF with worsening of pulmonary interstitial edema. No acute pneumonia. Thoracic aortic atherosclerosis. Electronically Signed   By: David  Martinique M.D.   On: 04/14/2017 10:16    Cardiac Studies   ECHO: 04/14/2017 - Left ventricle: The cavity size was normal. Wall thickness was   increased in a pattern of mild LVH. Systolic function was   moderately reduced. The estimated ejection fraction was in the   range of 35% to 40%. Diffuse hypokinesis. Indeterminant diastolic   function (atrial fibrillation). - Aortic valve: There was no stenosis. - Mitral valve: Mildly calcified annulus. There was mild   regurgitation. - Left atrium: The atrium was moderately dilated. - Right ventricle: The cavity size was normal. Systolic function   was normal. - Right atrium: The atrium was mildly dilated. - Tricuspid valve: Peak RV-RA gradient (S): 24 mm Hg. - Systemic veins: IVC was not visualized. Impressions: - The patient was in atrial fibrillation. Normal LV size with mild   LV hypertrophy. EF 35-40%, diffuse hypokinesis. Normal RV size   and systolic function. Mild mitral regurgitation.  Patient Profile     76 y.o. female with a hx of PAF on flecainide, no anticoagulation, intol metop and Cardizem, cirrhosis, HTN, OSA on nocturnal O2, anemia, pre-DM, hypothyroid, diverticulosis, portal hypertensive gastropathy, esoph varices, was admitted 11-03 w/ CAP, acute resp  failure w/ hypoxia, reactive airway dz (started on Duo-nebs tid and steroids), UTI.  Flecainide initially continued, pt became volume overload and started on IV Lasix. 11/06, pt developed WCT, assoc w/ CP. Lopressor 5 mg given>>HR slowed, IV amio bolus>>afib>>SR later.   Assessment & Plan    1. VT: Pt seen by Dr Rayann Heman. -  VT in the setting of acute illness, w/ EF acutely depressed>>need to consider ischemia eval.  - BB appropriate - d/c flecainide also appropriate - AAD options extremely limited w/ decreased EF, ?CAD,  ARF, cirrhosis - amio best option for now, if renal function improves, could consider sotalol - pt is not ICD candidate, given co-morbidities, DNRI and advanced cirrhosis  2. Abnormal echo: - EF was 65-70% 11/04, was 35-40% 11/06 - BB started, I will increase to Toprol XL 100 mg po daily and she remains tachycardic - Cr improved  but BUN still sig abnl, hold off on ACE/ARB - if EF decrease is 2nd arrhythmia, may improve - she denies any prior chest pain  3. PAF:  - CHADS2VASC=4 (age x 1, female, HTN, CHF)  - However, pt not anticoag candidate due to cirrhosis and esoph varices - hopefully amiodarone would keep her out of a-fib  For questions or updates, please contact [place] Please consult [email or web address] for contact info under Cardiology/STEMI.   Ena Dawley, MD 04/15/2017, 10:04 AM

## 2017-04-15 NOTE — Evaluation (Signed)
Physical Therapy Evaluation Patient Details Name: Brittney Valentine MRN: [id number] DOB: 14-Mar-1941 Today's Date: 04/15/2017   History of Present Illness  76 y.o. female with history of cirrhosis (NASH) portal hypertension, esophageal varices, paroxysmal atrial fibrillation not on anticoagulation, hypertension, OSA, chronic anemia and admitted initially to the hospital for sepsis secondary to pneumonia/UTI. Hospital course complicated by sustained ventricular tachycardia; cardiology following.  Clinical Impression  Pt admitted with above diagnosis. Pt currently with functional limitations due to the deficits listed below (see PT Problem List).  Pt will benefit from skilled PT to increase their independence and safety with mobility to allow discharge to the venue listed below.  Pt assisted to bathroom and then ambulated in hallway.  Pt will likely progress well with continued mobility in acute setting.  Recommend nursing also ambulate with pt.     Follow Up Recommendations Home health PT;Supervision - Intermittent    Equipment Recommendations  Other (comment)(TBA - will likely progress to no needs)    Recommendations for Other Services       Precautions / Restrictions Precautions Precautions: Fall Precaution Comments: monitor vitals  SpO2 94-96% room air HR 85-106 bpm BP after ambulating 145/101 mmHg RN aware      Mobility  Bed Mobility Overal bed mobility: Needs Assistance Bed Mobility: Supine to Sit     Supine to sit: Min guard;HOB elevated     General bed mobility comments: min/guard for safety, lines  Transfers Overall transfer level: Needs assistance Equipment used: 1 person hand held assist Transfers: Sit to/from Stand Sit to Stand: Min assist         General transfer comment: min/guard from bed surface, slight assist for steadying from toilet height, cues for grab bar  Ambulation/Gait Ambulation/Gait assistance: Min guard Ambulation Distance (Feet): 200  Feet Assistive device: None Gait Pattern/deviations: Step-through pattern;Decreased stride length     General Gait Details: pt pushed IV pole, HR 85-106 during ambulation, SpO2 94-96% on room air, tolerated distance well  Stairs            Wheelchair Mobility    Modified Rankin (Stroke Patients Only)       Balance Overall balance assessment: Needs assistance         Standing balance support: No upper extremity supported Standing balance-Leahy Scale: Fair                               Pertinent Vitals/Pain Pain Assessment: No/denies pain Pain Intervention(s): Repositioned    Home Living Family/patient expects to be discharged to:: Private residence Living Arrangements: Alone   Type of Home: House       Home Layout: One level Home Equipment: None      Prior Function Level of Independence: Independent               Hand Dominance        Extremity/Trunk Assessment        Lower Extremity Assessment Lower Extremity Assessment: Generalized weakness       Communication   Communication: No difficulties  Cognition Arousal/Alertness: Awake/alert Behavior During Therapy: WFL for tasks assessed/performed Overall Cognitive Status: Within Functional Limits for tasks assessed                                        General Comments      Exercises  Assessment/Plan    PT Assessment Patient needs continued PT services  PT Problem List Decreased strength;Decreased activity tolerance;Decreased mobility;Decreased knowledge of use of DME       PT Treatment Interventions DME instruction;Therapeutic activities;Gait training;Functional mobility training;Therapeutic exercise;Patient/family education;Balance training    PT Goals (Current goals can be found in the Care Plan section)  Acute Rehab PT Goals PT Goal Formulation: With patient Time For Goal Achievement: 04/29/17 Potential to Achieve Goals: Good     Frequency Min 3X/week   Barriers to discharge        Co-evaluation               AM-PAC PT "6 Clicks" Daily Activity  Outcome Measure Difficulty turning over in bed (including adjusting bedclothes, sheets and blankets)?: None Difficulty moving from lying on back to sitting on the side of the bed? : A Little Difficulty sitting down on and standing up from a chair with arms (e.g., wheelchair, bedside commode, etc,.)?: Unable Help needed moving to and from a bed to chair (including a wheelchair)?: A Little Help needed walking in hospital room?: A Little Help needed climbing 3-5 steps with a railing? : A Lot 6 Click Score: 16    End of Session Equipment Utilized During Treatment: Gait belt Activity Tolerance: Patient tolerated treatment well Patient left: in chair;with call bell/phone within reach;with family/visitor present Nurse Communication: Mobility status PT Visit Diagnosis: Difficulty in walking, not elsewhere classified (R26.2)    Time: 7939-0300 PT Time Calculation (min) (ACUTE ONLY): 29 min   Charges:   PT Evaluation $PT Eval Moderate Complexity: 1 Mod     PT G CodesCarmelia Bake, PT, DPT 04/15/2017 Pager: [phone]  York Ram E 04/15/2017, 12:05 PM

## 2017-04-15 NOTE — Evaluation (Signed)
Occupational Therapy Evaluation Patient Details Name: Brittney Valentine MRN: [id number] DOB: 1940/08/09 Today's Date: 04/15/2017    History of Present Illness 76 y.o. female with history of cirrhosis (NASH) portal hypertension, esophageal varices, paroxysmal atrial fibrillation not on anticoagulation, hypertension, OSA, chronic anemia and admitted initially to the hospital for sepsis secondary to pneumonia/UTI. Hospital course complicated by sustained ventricular tachycardia; cardiology following.   Clinical Impression   Pt was admitted for the above.  She is usually independent with adls. She needs up to max A for hygiene and min A for toilet transfers at this time. Will follow in acute with mod I level goals    Follow Up Recommendations  (HHOT vs no follow up, depending upon progress)    Equipment Recommendations  None recommended by OT    Recommendations for Other Services       Precautions / Restrictions Precautions Precautions: Fall Precaution Comments: monitor vitals Restrictions Weight Bearing Restrictions: No      Mobility Bed Mobility Overal bed mobility: Needs Assistance Bed Mobility: Supine to Sit     Supine to sit: Min guard;HOB elevated Sit to supine: Min guard   General bed mobility comments: min/guard for safety, lines  Transfers Overall transfer level: Needs assistance Equipment used: 1 person hand held assist Transfers: Sit to/from Stand Sit to Stand: Min assist         General transfer comment: steadying assistance    Balance Overall balance assessment: Needs assistance         Standing balance support: No upper extremity supported Standing balance-Leahy Scale: Fair                             ADL either performed or assessed with clinical judgement   ADL Overall ADL's : Needs assistance/impaired Eating/Feeding: Independent   Grooming: Set up;Sitting;Oral care;Wash/dry hands;Wash/dry face   Upper Body Bathing: Set  up;Sitting   Lower Body Bathing: Moderate assistance;Sit to/from stand   Upper Body Dressing : Minimal assistance;Sitting(lines)   Lower Body Dressing: Moderate assistance;Sit to/from stand   Toilet Transfer: Minimal assistance;Ambulation;BSC   Toileting- Clothing Manipulation and Hygiene: Maximal assistance;Sit to/from stand         General ADL Comments: ambulated to bathroom, performed ADL and used toilet. Pt has difficulty with peri area cleaning--may be due to multiple lines.       Vision         Perception     Praxis      Pertinent Vitals/Pain Pain Assessment: No/denies pain Pain Intervention(s): Repositioned     Hand Dominance     Extremity/Trunk Assessment Upper Extremity Assessment Upper Extremity Assessment: Overall WFL for tasks assessed   Lower Extremity Assessment Lower Extremity Assessment: Generalized weakness       Communication Communication Communication: No difficulties   Cognition Arousal/Alertness: Awake/alert Behavior During Therapy: WFL for tasks assessed/performed Overall Cognitive Status: Within Functional Limits for tasks assessed                                     General Comments  VSS    Exercises     Shoulder Instructions      Home Living Family/patient expects to be discharged to:: Private residence Living Arrangements: Alone   Type of Home: House       Home Layout: One level     Bathroom Shower/Tub: Tub/shower unit  Bathroom Toilet: Handicapped height     Home Equipment: None          Prior Functioning/Environment Level of Independence: Independent                 OT Problem List: Decreased strength;Decreased activity tolerance;Impaired balance (sitting and/or standing);Decreased knowledge of use of DME or AE;Cardiopulmonary status limiting activity      OT Treatment/Interventions: Self-care/ADL training;DME and/or AE instruction;Patient/family education;Balance training    OT  Goals(Current goals can be found in the care plan section) Acute Rehab OT Goals Patient Stated Goal: return to independence OT Goal Formulation: With patient Time For Goal Achievement: 04/29/17 Potential to Achieve Goals: Good ADL Goals Pt Will Transfer to Toilet: with modified independence;ambulating Pt Will Perform Toileting - Clothing Manipulation and hygiene: Independently;sit to/from stand Additional ADL Goal #1: pt will gather clothes and complete adl at mod I level  OT Frequency: Min 2X/week   Barriers to D/C:            Co-evaluation              AM-PAC PT "6 Clicks" Daily Activity     Outcome Measure Help from another person eating meals?: None Help from another person taking care of personal grooming?: A Little Help from another person toileting, which includes using toliet, bedpan, or urinal?: A Lot Help from another person bathing (including washing, rinsing, drying)?: A Lot Help from another person to put on and taking off regular upper body clothing?: A Little Help from another person to put on and taking off regular lower body clothing?: A Lot 6 Click Score: 16   End of Session    Activity Tolerance: Patient tolerated treatment well Patient left: in bed;with call bell/phone within reach;with nursing/sitter in room  OT Visit Diagnosis: Unsteadiness on feet (R26.81)                Time: 2951-8841 OT Time Calculation (min): 32 min Charges:  OT General Charges $OT Visit: 1 Visit OT Evaluation $OT Eval Low Complexity: 1 Low OT Treatments $Self Care/Home Management : 8-22 mins G-Codes:     [place] Walk, OTR/L [phone] 04/15/2017  Brittney Valentine 04/15/2017, 3:00 PM

## 2017-04-16 ENCOUNTER — Telehealth: Payer: Self-pay | Admitting: Cardiology

## 2017-04-16 DIAGNOSIS — J181 Lobar pneumonia, unspecified organism: Secondary | ICD-10-CM

## 2017-04-16 DIAGNOSIS — I5042 Chronic combined systolic (congestive) and diastolic (congestive) heart failure: Secondary | ICD-10-CM

## 2017-04-16 DIAGNOSIS — A419 Sepsis, unspecified organism: Principal | ICD-10-CM

## 2017-04-16 DIAGNOSIS — I5043 Acute on chronic combined systolic (congestive) and diastolic (congestive) heart failure: Secondary | ICD-10-CM

## 2017-04-16 LAB — GLUCOSE, CAPILLARY
GLUCOSE-CAPILLARY: 96 mg/dL (ref 65–99)
GLUCOSE-CAPILLARY: 135 mg/dL — AB (ref 65–99)
Glucose-Capillary: 124 mg/dL — ABNORMAL HIGH (ref 65–99)
Glucose-Capillary: 120 mg/dL — ABNORMAL HIGH (ref 65–99)

## 2017-04-16 LAB — CBC
HEMATOCRIT: 43.9 % (ref 36.0–46.0)
HEMOGLOBIN: 15.2 g/dL — AB (ref 12.0–15.0)
MCH: 31.4 pg (ref 26.0–34.0)
MCHC: 34.6 g/dL (ref 30.0–36.0)
MCV: 90.7 fL (ref 78.0–100.0)
Platelets: 209 10*3/uL (ref 150–400)
RBC: 4.84 MIL/uL (ref 3.87–5.11)
RDW: 18.1 % — AB (ref 11.5–15.5)
WBC: 11.5 10*3/uL — AB (ref 4.0–10.5)

## 2017-04-16 LAB — BASIC METABOLIC PANEL
ANION GAP: 9 (ref 5–15)
BUN: 32 mg/dL — ABNORMAL HIGH (ref 6–20)
CALCIUM: 8.7 mg/dL — AB (ref 8.9–10.3)
CHLORIDE: 100 mmol/L — AB (ref 101–111)
CO2: 28 mmol/L (ref 22–32)
Creatinine, Ser: 0.84 mg/dL (ref 0.44–1.00)
GFR calc non Af Amer: >60 mL/min (ref >60)
Glucose, Bld: 145 mg/dL — ABNORMAL HIGH (ref 65–99)
Potassium: 3.2 mmol/L — ABNORMAL LOW (ref 3.5–5.1)
SODIUM: 137 mmol/L (ref 135–145)

## 2017-04-16 MED ORDER — POTASSIUM CHLORIDE CRYS ER 20 MEQ PO TBCR
40.0000 meq | EXTENDED_RELEASE_TABLET | Freq: Every day | ORAL | Status: DC
  Administered 2017-04-16 – 2017-04-17 (×2): 40 meq via ORAL
  Filled 2017-04-16 (×2): qty 2

## 2017-04-16 MED ORDER — AMIODARONE HCL 200 MG PO TABS
200.0000 mg | ORAL_TABLET | Freq: Two times a day (BID) | ORAL | Status: DC
  Administered 2017-04-16 – 2017-04-17 (×3): 200 mg via ORAL
  Filled 2017-04-16 (×3): qty 1

## 2017-04-16 NOTE — Telephone Encounter (Signed)
11/8 Brittney B. TCM/ with Rhonda11/16/2018 @9 :00am/ks

## 2017-04-16 NOTE — Progress Notes (Signed)
Progress Note  Patient Name: Brittney Valentine Date of Encounter: 04/16/2017  Primary Cardiologist: Dr Percival Spanish  Subjective   No CP, breathing better. Not aware of irregular HR  Inpatient Medications    Scheduled Meds: . amiodarone  150 mg Intravenous Once  . amLODipine  2.5 mg Oral Daily  . budesonide (PULMICORT) nebulizer solution  0.5 mg Nebulization BID  . enoxaparin (LOVENOX) injection  40 mg Subcutaneous Q24H  . fluticasone  2 spray Each Nare Daily  . furosemide  40 mg Intravenous Q6H  . guaiFENesin  1,200 mg Oral BID  . insulin aspart  0-15 Units Subcutaneous TID WC  . insulin aspart  0-5 Units Subcutaneous QHS  . levothyroxine  112 mcg Oral QAC breakfast  . loratadine  10 mg Oral Daily  . metoprolol succinate  100 mg Oral Daily  . potassium chloride  40 mEq Oral Daily  . predniSONE  40 mg Oral Q supper  . senna  1 tablet Oral BID  . sodium chloride flush  10-40 mL Intracatheter Q12H   Continuous Infusions: . amiodarone 30 mg/hr (04/15/17 1846)  .  ceFAZolin (ANCEF) IV 2 g (04/16/17 0600)   PRN Meds: albuterol, bisacodyl, ondansetron **OR** ondansetron (ZOFRAN) IV, polyethylene glycol, sodium chloride flush   Vital Signs    Vitals:   04/16/17 0400 04/16/17 0448 04/16/17 0500 04/16/17 0600  BP: 118/85  (!) 128/100 (!) 156/89  Pulse: 77   (!) 109  Resp: 14  15 19   Temp:      TempSrc:      SpO2: 92%   94%  Weight:  234 lb 9.1 oz (106.4 kg)    Height:        Intake/Output Summary (Last 24 hours) at 04/16/2017 0753 Last data filed at 04/16/2017 0600 Gross per 24 hour  Intake 1341.49 ml  Output 2750 ml  Net -1408.51 ml   Filed Weights   04/14/17 1000 04/15/17 0254 04/16/17 0448  Weight: 251 lb 5.2 oz (114 kg) 241 lb 10 oz (109.6 kg) 234 lb 9.1 oz (106.4 kg)    Telemetry    Atrial fib, rate elevated at times, but not much over 100 - Personally Reviewed  Physical Exam   GEN: No acute distress.   Neck: No JVD Cardiac: Irreg R&R, no murmurs, rubs,  or gallops.  Respiratory: rales bases bilaterally. GI: Soft, nontender, non-distended  MS: No edema; No deformity. Neuro:  Nonfocal  Psych: Normal affect   Labs    Chemistry Recent Labs  Lab 04/13/17 0130  04/14/17 0407 04/14/17 1125 04/15/17 0330 04/16/17 0452  NA 135  --  135 138 137 137  K 5.1   < > 4.9 4.6 4.1 3.2*  CL 109  --  110 112* 105 100*  CO2 18*  --  18* 19* 25 28  GLUCOSE 164*  --  121* 151* 149* 145*  BUN 40*  --  50* 51* 40* 32*  CREATININE 1.74*  --  1.15* 1.03* 1.08* 0.84  CALCIUM 8.3*  --  9.2 9.7 9.2 8.7*  PROT 5.6*  --  5.8*  --  6.1*  --   ALBUMIN 2.3*  --  2.2*  --  2.2*  --   AST 239*  --  132*  --  100*  --   ALT 109*  --  89*  --  80*  --   ALKPHOS 53  --  51  --  74  --   BILITOT 1.4*  --  1.1  --  1.3*  --   GFRNONAA 28*  --  46* 53* 50* >60  GFRAA 32*  --  53* >60 58* >60  ANIONGAP 8  --  7 7 7 9    < > = values in this interval not displayed.     Hematology Recent Labs  Lab 04/14/17 0407 04/15/17 0330 04/16/17 0452  WBC 11.1* 14.0* 11.5*  RBC 4.05 4.49 4.84  HGB 12.5 14.4 15.2*  HCT 37.4 41.4 43.9  MCV 92.3 92.2 90.7  MCH 30.9 32.1 31.4  MCHC 33.4 34.8 34.6  RDW 18.7* 18.7* 18.1*  PLT 185 215 209    Cardiac Enzymes Recent Labs  Lab 04/14/17 1125 04/14/17 1649 04/14/17 2322  TROPONINI 0.15* 0.16* 0.14*      BNP Recent Labs  Lab 04/14/17 1125  BNP 603.4*    Radiology    No results found.  Cardiac Studies   ECHO: 04/14/2017 - Left ventricle: The cavity size was normal. Wall thickness was   increased in a pattern of mild LVH. Systolic function was   moderately reduced. The estimated ejection fraction was in the   range of 35% to 40%. Diffuse hypokinesis. Indeterminant diastolic   function (atrial fibrillation). - Aortic valve: There was no stenosis. - Mitral valve: Mildly calcified annulus. There was mild   regurgitation. - Left atrium: The atrium was moderately dilated. - Right ventricle: The cavity size  was normal. Systolic function   was normal. - Right atrium: The atrium was mildly dilated. - Tricuspid valve: Peak RV-RA gradient (S): 24 mm Hg. - Systemic veins: IVC was not visualized. Impressions: - The patient was in atrial fibrillation. Normal LV size with mild   LV hypertrophy. EF 35-40%, diffuse hypokinesis. Normal RV size   and systolic function. Mild mitral regurgitation.  Patient Profile     76 y.o. female with a hx of PAF on flecainide, no anticoagulation, intol metop and Cardizem, cirrhosis, HTN, OSA on nocturnal O2, anemia, pre-DM, hypothyroid, diverticulosis, portal hypertensive gastropathy, esoph varices, was admitted 11-03 w/ CAP, acute resp  failure w/ hypoxia, reactive airway dz (started on Duo-nebs tid and steroids), UTI.  Flecainide initially continued, pt became volume overload and started on IV Lasix. 11/06, pt developed WCT, assoc w/ CP. Lopressor 5 mg given>>HR slowed, IV amio bolus>>afib>>SR later.   Assessment & Plan    1. VT: Pt seen by Dr Rayann Heman. - VT happened in setting of acute illness, but w/ EF acutely depressed>>needs ischemia eval, timing per MD - on BB - cannot use Flecainide any more>>decreased EF, ?CAD - AAD options limited by lower EF, ARF, cirrhosis, ?CAD - amio for now, consider sotalol if renal function improves - renal function has improved, MD to review - no ICD 2nd DNRI, advanced cirrhosis and other comorbidities  2. Abnormal echo: - EF was 65-70% 11/04, was 35-40% 11/06 - on Toprol XL 100 mg qd since 11/07, for tachycardia  - no ACE/ARB 2nd abnormal renal function - EF may improve, if 2nd tachycardia - no hx CP  3. PAF:  - CHADS2VASC=4 (age x 1, female, HTN, CHF)  - Pt is not anticoag candidate 2nd esoph varices and cirrhosis - continue amio to maintain SR but change to po at 200 mg bid and see how tolerated - still in afib today, but rate is better, pt asymptomatic w/ it.   For questions or updates, please contact [place] Please consult [email or web address] for contact info under Cardiology/STEMI.   Barrett, Suanne Marker,  PA-C 04/16/2017, 7:53 AM     The patient was seen, examined and discussed with Rosaria Ferries, PA-C and I agree with the above.   The patient is not aware of palpitations. I would continue amiodarone for both VTs and PAF. She is a poor candidate for anticoagulation given liver cirrhosis and esophageal varices. We will try to maintain the SR. Continue iv amiodarone for now. Given decreased LVEF this is possibly sec to a-fib with RVR, she denies chest pain, she is a poor cath candidate given high risk of bleeding and overall poor prognosis. I will increase Toprol XL to 150 mg po daily. I would continue medical therapy and repeat echo in 2 months, if LVEF still low and the patient is symptomatic at that time I would continue a cardiac catheterization.  Replace potassium. Continue diuresis, good response so far and improving crea.  Ena Dawley, MD 04/16/2017

## 2017-04-16 NOTE — Progress Notes (Signed)
PROGRESS NOTE        PATIENT DETAILS Name: Brittney Valentine Age: 76 y.o. Sex: female Date of Birth: Jan 28, 1941 Admit Date: 04/11/2017 Admitting Physician Toy Baker, MD [id number], Gwyndolyn Saxon, MD  Brief Narrative: Patient is a 76 y.o. female with history of cirrhosis (NAS H) portal hypertension, esophageal varices, paroxysmal atrial fibrillation not on anticoagulation, hypertension admitted initially to the hospital for sepsis secondary to pneumonia/UTI. Hospital course complicated by sustained ventricular tachycardia. See below for further details.  Subjective: Continues to improve-denies any chest pain or shortness of breath.  Assessment/Plan: Sepsis secondary to pneumonia and UTI: Sepsis pathophysiology has resolved, likely secondary to pneumonia and/or UTI. Initially on broad-spectrum antimicrobial therapy, urine culture positive for pansensitive Escherichia coli, has been transitioned to Ancef. Blood cultures continued to be negative.  Acute on chronic hypoxic respiratory failure: Probably secondary to pneumonia, and some element of decompensated CHF, improving with antimicrobial therapy, Lasix, bronchodilators. Patient is on nocturnal O2 at home, presumably for OSA..  Sustained ventricular tachycardia: Occurred on 11/6-started on amiodarone infusion-cardiology/EP following. Per EP, patient not a candidate for ICD advanced therapies, recommendations are to continue with amiodarone. She is no longer on flecainide. We'll await further recommendations from the cardiology service.   Acute on Chronic combined systolic and diastolic heart failure: Volume status has improved, now in negative balance-but still not back to her dry weight-weight has decreased to 234 pounds. Continue diuretics, follow intake/output, and await further recommendations from cardiology. Echocardiogram on 11/6 showed EF around 35-40%. Appreciate cardiology input.  Paroxysmal atrial  fibrillation: Previously was on flecainide, this has now been discontinued-patient previously has been deemed not to be a candidate for anticoagulation, due to bleeding risk and history of underlying cirrhosis, and gastric/esophageal varices. Currently on beta blocker and amiodarone. Chads2vasc score of 3.  Acute kidney injury: Likely hemodynamically mediated, resolved.   Hypertension: Blood pressure stable, continue amlodipine, metoprolol. Follow and adjust accordingly.   Hypothyroidism: Continue Synthroid  History of NASH/liver cirrhosis: Currently compensated-follows with Dr. Rayetta Pigg endoscopy in 2013 showed portal gastropathy and varices. Follow.  OSA: Unable to tolerate CPAP-apparently on nocturnal O2.  Left upper extremity swelling: Likely secondary to IV infiltration-Doppler on 11/5 negative for DVT. Continue supportive care, seems to be improving.  DVT Prophylaxis: Prophylactic Lovenox   Code Status:  DNR  Family Communication: None at bedside  Disposition Plan: Remain inpatient- transfer to telemetry. Hopefully home in the next few days.  Antimicrobial agents: Anti-infectives (From admission, onward)   Start     Dose/Rate Route Frequency Ordered Stop   04/15/17 1515  ceFAZolin (ANCEF) IVPB 2g/100 mL premix     2 g 200 mL/hr over 30 Minutes Intravenous Every 8 hours 04/15/17 1514     04/15/17 1400  ceFAZolin (ANCEF) IVPB 2g/100 mL premix  Status:  Discontinued     2 g 200 mL/hr over 30 Minutes Intravenous Every 8 hours 04/15/17 1157 04/15/17 1504   04/14/17 1600  vancomycin (VANCOCIN) IVPB 1000 mg/200 mL premix  Status:  Discontinued     1,000 mg 200 mL/hr over 60 Minutes Intravenous Every 48 hours 04/12/17 1352 04/14/17 1127   04/12/17 2200  azithromycin (ZITHROMAX) 500 mg in dextrose 5 % 250 mL IVPB  Status:  Discontinued     500 mg 250 mL/hr over 60 Minutes Intravenous Every 24 hours 04/12/17 0316 04/14/17 1127   04/12/17  2200  cefTRIAXone (ROCEPHIN) 1 g  in dextrose 5 % 50 mL IVPB  Status:  Discontinued     1 g 100 mL/hr over 30 Minutes Intravenous Every 24 hours 04/12/17 0316 04/12/17 1341   04/12/17 1500  vancomycin (VANCOCIN) 2,000 mg in sodium chloride 0.9 % 500 mL IVPB     2,000 mg 250 mL/hr over 120 Minutes Intravenous  Once 04/12/17 1352 04/12/17 1850   04/12/17 1400  ceFEPIme (MAXIPIME) 2 g in dextrose 5 % 50 mL IVPB  Status:  Discontinued     2 g 100 mL/hr over 30 Minutes Intravenous Every 24 hours 04/12/17 1350 04/15/17 1138   04/11/17 2030  cefTRIAXone (ROCEPHIN) 1 g in dextrose 5 % 50 mL IVPB     1 g 100 mL/hr over 30 Minutes Intravenous  Once 04/11/17 2017 04/12/17 1659   04/11/17 2030  azithromycin (ZITHROMAX) 500 mg in dextrose 5 % 250 mL IVPB     500 mg 250 mL/hr over 60 Minutes Intravenous  Once 04/11/17 2017 04/12/17 1659      Procedures: 11/6>> echocardiogram: - The patient was in atrial fibrillation. Normal LV size with mild   LV hypertrophy. EF 35-40%, diffuse hypokinesis. Normal RV size   and systolic function. Mild mitral regurgitation.  11/4>> echocardiogram: Vigorous LV systolic function; elevated velocity across aortic   valve (2.5 m/s) likely due to vigorous LV function as aortic   valve appears to open well; mild LAE/RAE/RVE; mild TR; moderate   pulmonary hypertension.   CONSULTS:  cardiology  Time spent: 25- minutes-Greater than 50% of this time was spent in counseling, explanation of diagnosis, planning of further management, and coordination of care.  MEDICATIONS: Scheduled Meds: . amiodarone  200 mg Oral BID  . amLODipine  2.5 mg Oral Daily  . budesonide (PULMICORT) nebulizer solution  0.5 mg Nebulization BID  . enoxaparin (LOVENOX) injection  40 mg Subcutaneous Q24H  . fluticasone  2 spray Each Nare Daily  . furosemide  40 mg Intravenous Q6H  . guaiFENesin  1,200 mg Oral BID  . insulin aspart  0-15 Units Subcutaneous TID WC  . insulin aspart  0-5 Units Subcutaneous QHS  . levothyroxine   112 mcg Oral QAC breakfast  . loratadine  10 mg Oral Daily  . metoprolol succinate  100 mg Oral Daily  . potassium chloride  40 mEq Oral Daily  . predniSONE  40 mg Oral Q supper  . senna  1 tablet Oral BID  . sodium chloride flush  10-40 mL Intracatheter Q12H   Continuous Infusions: .  ceFAZolin (ANCEF) IV 2 g (04/16/17 0600)   PRN Meds:.albuterol, bisacodyl, ondansetron **OR** ondansetron (ZOFRAN) IV, polyethylene glycol, sodium chloride flush   PHYSICAL EXAM: Vital signs: Vitals:   04/16/17 0600 04/16/17 0800 04/16/17 0810 04/16/17 1011  BP: (!) 156/89 (!) 143/87  (!) 146/74  Pulse: (!) 109 91  75  Resp: 19 14    Temp:  97.7 F (36.5 C)    TempSrc:  Oral    SpO2: 94% 94% 95%   Weight:      Height:       Filed Weights   04/14/17 1000 04/15/17 0254 04/16/17 0448  Weight: 114 kg (251 lb 5.2 oz) 109.6 kg (241 lb 10 oz) 106.4 kg (234 lb 9.1 oz)   Body mass index is 39.03 kg/m.   General appearance :Awake, alert, not in any distress.  Eyes:, pupils equally reactive to light and accomodation,no scleral icterus. HEENT: Atraumatic and Normocephalic  Neck: supple, no JVD. Resp:Good air entry bilaterally CVS: S1 S2 regular, no murmurs.  GI: Bowel sounds present, Non tender and not distended with no gaurding, rigidity or rebound. Extremities: B/L Lower Ext shows no edema, both legs are warm to touch Neurology:  speech clear,Non focal, sensation is grossly intact. Psychiatric: Normal judgment and insight. Normal mood. Musculoskeletal:No digital cyanosis Skin:No Rash, warm and dry Wounds:N/A  I have personally reviewed following labs and imaging studies  LABORATORY DATA: CBC: Recent Labs  Lab 04/11/17 1522 04/12/17 0924 04/13/17 0130 04/14/17 0407 04/15/17 0330 04/16/17 0452  WBC 2.9* 20.7* 14.7* 11.1* 14.0* 11.5*  NEUTROABS 2.5 17.0* 12.5* 9.4* 12.6*  --   HGB 14.5 13.1 11.9* 12.5 14.4 15.2*  HCT 43.6 40.5 36.7 37.4 41.4 43.9  MCV 95.0 96.0 94.8 92.3 92.2 90.7    PLT 204 193 150 185 215 102    Basic Metabolic Panel: Recent Labs  Lab 04/12/17 0924 04/13/17 0130 04/13/17 0911 04/14/17 0407 04/14/17 1125 04/15/17 0330 04/16/17 0452  NA 137 135  --  135 138 137 137  K 4.0 5.1 5.1 4.9 4.6 4.1 3.2*  CL 106 109  --  110 112* 105 100*  CO2 17* 18*  --  18* 19* 25 28  GLUCOSE 80 164*  --  121* 151* 149* 145*  BUN 28* 40*  --  50* 51* 40* 32*  CREATININE 2.02* 1.74*  --  1.15* 1.03* 1.08* 0.84  CALCIUM 9.1 8.3*  --  9.2 9.7 9.2 8.7*  MG 1.8 1.9  --   --  2.1 1.7  --   PHOS 5.5*  --   --   --   --   --   --     GFR: Estimated Creatinine Clearance: 72.3 mL/min (by C-G formula based on SCr of 0.84 mg/dL).  Liver Function Tests: Recent Labs  Lab 04/11/17 1522 04/12/17 0924 04/13/17 0130 04/14/17 0407 04/15/17 0330  AST 82* 175* 239* 132* 100*  ALT 32 68* 109* 89* 80*  ALKPHOS 106 64 53 51 74  BILITOT 2.2* 1.7* 1.4* 1.1 1.3*  PROT 6.8 6.1* 5.6* 5.8* 6.1*  ALBUMIN 2.9* 2.5* 2.3* 2.2* 2.2*   No results for input(s): LIPASE, AMYLASE in the last 168 hours. No results for input(s): AMMONIA in the last 168 hours.  Coagulation Profile: Recent Labs  Lab 04/12/17 0923  INR 1.64    Cardiac Enzymes: Recent Labs  Lab 04/14/17 1125 04/14/17 1649 04/14/17 2322  TROPONINI 0.15* 0.16* 0.14*    BNP (last 3 results) No results for input(s): PROBNP in the last 8760 hours.  HbA1C: No results for input(s): HGBA1C in the last 72 hours.  CBG: Recent Labs  Lab 04/15/17 1150 04/15/17 1640 04/15/17 2159 04/16/17 0808 04/16/17 1156  GLUCAP 89 78 116* 96 124*    Lipid Profile: No results for input(s): CHOL, HDL, LDLCALC, TRIG, CHOLHDL, LDLDIRECT in the last 72 hours.  Thyroid Function Tests: Recent Labs    04/14/17 1125  TSH 1.345    Anemia Panel: No results for input(s): VITAMINB12, FOLATE, FERRITIN, TIBC, IRON, RETICCTPCT in the last 72 hours.  Urine analysis:    Component Value Date/Time   COLORURINE AMBER (A)  04/11/2017 1413   APPEARANCEUR CLOUDY (A) 04/11/2017 1413   LABSPEC 1.011 04/11/2017 1413   PHURINE 6.0 04/11/2017 1413   GLUCOSEU NEGATIVE 04/11/2017 1413   HGBUR LARGE (A) 04/11/2017 1413   BILIRUBINUR NEGATIVE 04/11/2017 1413   KETONESUR NEGATIVE 04/11/2017 1413   PROTEINUR 100 (  A) 04/11/2017 1413   UROBILINOGEN 1 12/21/2014 1004   NITRITE NEGATIVE 04/11/2017 1413   LEUKOCYTESUR LARGE (A) 04/11/2017 1413    Sepsis Labs: Lactic Acid, Venous    Component Value Date/Time   LATICACIDVEN 1.1 04/14/2017 0407    MICROBIOLOGY: Recent Results (from the past 240 hour(s))  Urine culture     Status: Abnormal   Collection Time: 04/11/17  9:14 PM  Result Value Ref Range Status   Specimen Description URINE, RANDOM  Final   Special Requests NONE  Final   Culture >=100,000 COLONIES/mL ESCHERICHIA COLI (A)  Final   Report Status 04/14/2017 FINAL  Final   Organism ID, Bacteria ESCHERICHIA COLI (A)  Final      Susceptibility   Escherichia coli - MIC*    AMPICILLIN >=32 RESISTANT Resistant     CEFAZOLIN <=4 SENSITIVE Sensitive     CEFTRIAXONE <=1 SENSITIVE Sensitive     CIPROFLOXACIN <=0.25 SENSITIVE Sensitive     GENTAMICIN <=1 SENSITIVE Sensitive     IMIPENEM <=0.25 SENSITIVE Sensitive     NITROFURANTOIN <=16 SENSITIVE Sensitive     TRIMETH/SULFA >=320 RESISTANT Resistant     AMPICILLIN/SULBACTAM 16 INTERMEDIATE Intermediate     PIP/TAZO <=4 SENSITIVE Sensitive     Extended ESBL NEGATIVE Sensitive     * >=100,000 COLONIES/mL ESCHERICHIA COLI  Respiratory Panel by PCR     Status: None   Collection Time: 04/12/17  3:11 AM  Result Value Ref Range Status   Adenovirus NOT DETECTED NOT DETECTED Final   Coronavirus 229E NOT DETECTED NOT DETECTED Final   Coronavirus HKU1 NOT DETECTED NOT DETECTED Final   Coronavirus NL63 NOT DETECTED NOT DETECTED Final   Coronavirus OC43 NOT DETECTED NOT DETECTED Final   Metapneumovirus NOT DETECTED NOT DETECTED Final   Rhinovirus / Enterovirus NOT  DETECTED NOT DETECTED Final   Influenza A NOT DETECTED NOT DETECTED Final   Influenza B NOT DETECTED NOT DETECTED Final   Parainfluenza Virus 1 NOT DETECTED NOT DETECTED Final   Parainfluenza Virus 2 NOT DETECTED NOT DETECTED Final   Parainfluenza Virus 3 NOT DETECTED NOT DETECTED Final   Parainfluenza Virus 4 NOT DETECTED NOT DETECTED Final   Respiratory Syncytial Virus NOT DETECTED NOT DETECTED Final   Bordetella pertussis NOT DETECTED NOT DETECTED Final   Chlamydophila pneumoniae NOT DETECTED NOT DETECTED Final   Mycoplasma pneumoniae NOT DETECTED NOT DETECTED Final    Comment: Performed at [facility], [place]. [address], [place], [place] [address]  Culture, blood (Routine X 2) w Reflex to ID Panel     Status: None (Preliminary result)   Collection Time: 04/12/17  1:42 PM  Result Value Ref Range Status   Specimen Description BLOOD BLOOD RIGHT ARM  Final   Special Requests   Final    BOTTLES DRAWN AEROBIC AND ANAEROBIC Blood Culture adequate volume   Culture   Final    NO GROWTH 4 DAYS Performed at [facility], [place] [address] [place], [place] [address]    Report Status PENDING  Incomplete  Culture, blood (Routine X 2) w Reflex to ID Panel     Status: None (Preliminary result)   Collection Time: 04/12/17  1:42 PM  Result Value Ref Range Status   Specimen Description BLOOD RIGHT ANTECUBITAL  Final   Special Requests IN PEDIATRIC BOTTLE Blood Culture adequate volume  Final   Culture   Final    NO GROWTH 4 DAYS Performed at [facility], [place]  [address], [place], [place] [address]    Report Status PENDING  Incomplete  MRSA PCR Screening     Status: None   Collection Time: 04/12/17  5:22 PM  Result Value Ref Range Status   MRSA by PCR NEGATIVE NEGATIVE Final    Comment:        The GeneXpert MRSA Assay (FDA approved for NASAL specimens only), is one component of a comprehensive MRSA colonization surveillance program. It is not intended to diagnose  MRSA infection nor to guide or monitor treatment for MRSA infections.     RADIOLOGY STUDIES/RESULTS: Dg Chest 2 View  Result Date: 04/11/2017 CLINICAL DATA:  76 year old female with right-sided flank pain radiating to abdomen. EXAM: CHEST  2 VIEW COMPARISON:  10/01/2014 FINDINGS: Cardiomediastinal silhouette is slightly enlarged. Note is made of low lung volumes with vascular congestion. Increased opacity at the left lung base concerning for focal infiltrate. No significant pleural effusions or pneumothorax. No acute osseous abnormalities. IMPRESSION: Markedly low lung volumes with left basilar opacity concerning for focal infiltrate. Follow-up to resolution recommended. Electronically Signed   By: Kristopher Oppenheim M.D.   On: 04/11/2017 17:41   Ct Angio Chest Pe W Or Wo Contrast  Result Date: 04/11/2017 CLINICAL DATA:  Shortness of breath. Tachypnea and oxygen desaturation when ambulating. EXAM: CT ANGIOGRAPHY CHEST WITH CONTRAST TECHNIQUE: Multidetector CT imaging of the chest was performed using the standard protocol during bolus administration of intravenous contrast. Multiplanar CT image reconstructions and MIPs were obtained to evaluate the vascular anatomy. CONTRAST:  100 cc Isovue 370 IV COMPARISON:  Chest radiograph earlier this day. Abdominal CT earlier this day. FINDINGS: Cardiovascular: There are no filling defects within the pulmonary arteries to the segmental level suggest pulmonary embolus. Cannot assess subsegmental branches due to contrast bolus timing and soft tissue attenuation from habitus. Mild multi chamber cardiomegaly. Atherosclerosis of the thoracic aorta. No evidence of dissection. No pericardial fluid. Mediastinum/Nodes: Small hiatal hernia with large paraesophageal varices. No enlarged mediastinal or hilar nodes, multiple small mediastinal nodes are seen. Visualized thyroid gland is diminutive. Lungs/Pleura: Subsegmental atelectasis in the lingula, right middle lobe and both  lower lobes, greater on the left. No confluent consolidation. Linear subpleural opacities favoring atelectasis over septal thickening. No pleural fluid. Upper Abdomen: Characterized on abdominal CT today. Cirrhosis and splenomegaly. Small amount of ascites. Perigastric and paraesophageal varices. Musculoskeletal: There are no acute or suspicious osseous abnormalities. Review of the MIP images confirms the above findings. IMPRESSION: 1. No central pulmonary embolus. 2. Mild cardiomegaly.  Aortic Atherosclerosis (ICD10-I70.0). 3. Multifocal atelectasis, greater in the left lung. No pleural fluid. 4. Cirrhosis, splenomegaly, paraesophageal and perigastric varices and small amount of abdominal ascites. Electronically Signed   By: Jeb Levering M.D.   On: 04/11/2017 23:38   Dg Chest Port 1 View  Result Date: 04/14/2017 CLINICAL DATA:  Hypoxia. History of atrial fibrillation, cirrhosis, episodes of reactive airway disease and pneumonia. EXAM: PORTABLE CHEST 1 VIEW COMPARISON:  Chest x-ray and chest CT scan of April 11, 2017 FINDINGS: The lungs are well-expanded. The cardiac silhouette is enlarged. The pulmonary vascularity is engorged and there is cephalization of the vascular pattern. The interstitial markings are more conspicuous today. There is no large pleural effusion. There is calcification in the wall of the aortic arch. The bony thorax exhibits no acute abnormality. IMPRESSION: CHF with worsening of pulmonary interstitial edema. No acute pneumonia. Thoracic aortic atherosclerosis. Electronically Signed   By: David  Martinique M.D.   On: 04/14/2017 10:16   Ct Renal  Stone Study  Result Date: 04/11/2017 CLINICAL DATA:  complaints of right sided flank pain radiating around into abdomen. Pain 10/10. Nausea/dry heaving. Denies urinary symptoms. Patient was vomiting yellow bile while she was on CT table. EXAM: CT ABDOMEN AND PELVIS WITHOUT CONTRAST TECHNIQUE: Multidetector CT imaging of the abdomen and pelvis  was performed following the standard protocol without IV contrast. COMPARISON:  10/02/2014 FINDINGS: Lower chest: No acute abnormality. Hepatobiliary: Small nodular liver consistent with advanced cirrhosis. Small cyst at the dome of the right lobe. No other liver mass or focal lesion. Status post cholecystectomy. No bile duct dilation. Pancreas: Several calcifications along the inferior margin of the pancreatic head. No pancreatic mass or inflammation. Spleen: Enlarged measuring 19 x 8 x 14 cm. No splenic mass or focal lesion. Adrenals/Urinary Tract: No adrenal masses. Mild dilation of the right intrarenal collecting system. Portions of the proximal right ureter mildly dilated. No right ureteral stone. No right renal mass or intrarenal stone. No left renal mass. No left hydronephrosis. Small nonobstructing stone in the upper pole. Normal left ureter. Bladder is unremarkable. Stomach/Bowel: Status post right partial colectomy. Anastomosis staple line is noted in the right mid to lower abdomen. No colonic wall thickening. There are scattered diverticular the left colon without diverticulitis. Stomach is unremarkable. No small bowel obstruction or convincing inflammation. Small bowel anastomosis staple line noted in the right mid to lower abdomen. Vascular/Lymphatic: There are numerous vascular collaterals in the upper abdomen reflecting portal venous hypertension. These include paraesophageal collaterals, stable when compared to the prior CT. Mild aortic atherosclerosis. There scattered prominent gastrohepatic and retroperitoneal lymph nodes none of which are pathologically enlarged by size criteria. Reproductive: Status post hysterectomy. No adnexal masses. Other: Small amount of ascites collects adjacent to the liver and spleen and between the leaves of the small bowel mesenteric. No abdominal wall hernia. Musculoskeletal: No fracture or acute finding. No osteoblastic or osteolytic lesions. IMPRESSION: 1. There is  mild dilation of the right intrarenal collecting system and portions of the proximal to mid right ureter, but no evidence of a ureteral stone. The possibility of a non radiopaque ureteral stone or recent passage of a stone should be considered given the presenting history. 2. No other evidence of an acute abnormality. 3. There changes of advanced cirrhosis with portal venous hypertension reflected by extensive varices, splenomegaly and a small amount of ascites. Ascites has increased in amount from the prior CT. Other findings are stable. 4. Stable changes from bowel surgery. 5. Mild aortic atherosclerosis. Electronically Signed   By: Lajean Manes M.D.   On: 04/11/2017 16:05     LOS: 5 days   Oren Binet, MD  [facility] Pager:[phone]  If 7PM-7AM, please contact night-coverage [email or web address] Password [email or web address] 04/16/2017, 12:41 PM

## 2017-04-16 NOTE — Plan of Care (Signed)
  Clinical Measurements: Ability to maintain clinical measurements within normal limits will improve 04/16/2017 2136 - Progressing by Ashley Murrain, RN   Clinical Measurements: Will remain free from infection 04/16/2017 2136 - Progressing by Ashley Murrain, RN   Activity: Risk for activity intolerance will decrease 04/16/2017 2136 - Progressing by Ashley Murrain, RN

## 2017-04-16 NOTE — Telephone Encounter (Signed)
11/8 Donnie Aho TCM/ Michelle 04/24/2017 @9 :00am/ks

## 2017-04-16 NOTE — Plan of Care (Signed)
  Progressing Health Behavior/Discharge Planning: Ability to manage health-related needs will improve 04/16/2017 1716 - Progressing by Naomie Dean, RN Clinical Measurements: Ability to maintain clinical measurements within normal limits will improve 04/16/2017 1716 - Progressing by Naomie Dean, RN Will remain free from infection 04/16/2017 1716 - Progressing by Naomie Dean, RN Diagnostic test results will improve 04/16/2017 1716 - Progressing by Naomie Dean, RN Respiratory complications will improve 04/16/2017 1716 - Progressing by Naomie Dean, RN Cardiovascular complication will be avoided 04/16/2017 1716 - Progressing by Naomie Dean, RN Activity: Risk for activity intolerance will decrease 04/16/2017 1716 - Progressing by Naomie Dean, RN Nutrition: Adequate nutrition will be maintained 04/16/2017 1716 - Progressing by Naomie Dean, RN Coping: Level of anxiety will decrease 04/16/2017 1716 - Progressing by Naomie Dean, RN Elimination: Will not experience complications related to bowel motility 04/16/2017 1716 - Progressing by Naomie Dean, RN Will not experience complications related to urinary retention 04/16/2017 1716 - Progressing by Naomie Dean, RN Pain Managment: General experience of comfort will improve 04/16/2017 1716 - Progressing by Naomie Dean, RN

## 2017-04-17 LAB — COMPREHENSIVE METABOLIC PANEL
ALBUMIN: 2 g/dL — AB (ref 3.5–5.0)
ALK PHOS: 77 U/L (ref 38–126)
ALT: 41 U/L (ref 14–54)
AST: 48 U/L — ABNORMAL HIGH (ref 15–41)
Anion gap: 8 (ref 5–15)
BILIRUBIN TOTAL: 1.6 mg/dL — AB (ref 0.3–1.2)
BUN: 34 mg/dL — AB (ref 6–20)
CALCIUM: 9 mg/dL (ref 8.9–10.3)
CO2: 28 mmol/L (ref 22–32)
CREATININE: 1.02 mg/dL — AB (ref 0.44–1.00)
Chloride: 103 mmol/L (ref 101–111)
GFR calc Af Amer: >60 mL/min (ref >60)
GFR calc non Af Amer: 53 mL/min — ABNORMAL LOW (ref >60)
GLUCOSE: 122 mg/dL — AB (ref 65–99)
Potassium: 3.6 mmol/L (ref 3.5–5.1)
SODIUM: 139 mmol/L (ref 135–145)
TOTAL PROTEIN: 5.4 g/dL — AB (ref 6.5–8.1)

## 2017-04-17 LAB — CULTURE, BLOOD (ROUTINE X 2)
CULTURE: NO GROWTH
Culture: NO GROWTH
Special Requests: ADEQUATE
Special Requests: ADEQUATE

## 2017-04-17 LAB — CBC
HEMATOCRIT: 47.4 % — AB (ref 36.0–46.0)
HEMOGLOBIN: 16.3 g/dL — AB (ref 12.0–15.0)
MCH: 31.1 pg (ref 26.0–34.0)
MCHC: 34.4 g/dL (ref 30.0–36.0)
MCV: 90.5 fL (ref 78.0–100.0)
Platelets: 245 10*3/uL (ref 150–400)
RBC: 5.24 MIL/uL — AB (ref 3.87–5.11)
RDW: 18.1 % — ABNORMAL HIGH (ref 11.5–15.5)
WBC: 12.3 10*3/uL — ABNORMAL HIGH (ref 4.0–10.5)

## 2017-04-17 LAB — GLUCOSE, CAPILLARY
Glucose-Capillary: 106 mg/dL — ABNORMAL HIGH (ref 65–99)
Glucose-Capillary: 106 mg/dL — ABNORMAL HIGH (ref 65–99)

## 2017-04-17 MED ORDER — METOPROLOL SUCCINATE ER 50 MG PO TB24: 150.0000 mg | ORAL_TABLET | Freq: Every day | ORAL | 0 refills | Status: DC

## 2017-04-17 MED ORDER — FUROSEMIDE 40 MG PO TABS: 40.0000 mg | ORAL_TABLET | Freq: Two times a day (BID) | ORAL | 0 refills | Status: DC

## 2017-04-17 MED ORDER — AMIODARONE HCL 200 MG PO TABS: 200.0000 mg | ORAL_TABLET | Freq: Two times a day (BID) | ORAL | Status: DC

## 2017-04-17 MED ORDER — AMIODARONE HCL 200 MG PO TABS: ORAL_TABLET | ORAL | 0 refills | Status: DC

## 2017-04-17 MED ORDER — CETIRIZINE HCL 10 MG PO TABS
10.0000 mg | ORAL_TABLET | Freq: Every day | ORAL | Status: DC
  Filled 2017-04-17: qty 1

## 2017-04-17 MED ORDER — POTASSIUM CHLORIDE CRYS ER 20 MEQ PO TBCR: 40.0000 meq | EXTENDED_RELEASE_TABLET | Freq: Every day | ORAL | 0 refills | Status: DC

## 2017-04-17 MED ORDER — METOPROLOL SUCCINATE ER 50 MG PO TB24: 150.0000 mg | ORAL_TABLET | Freq: Every day | ORAL | Status: DC

## 2017-04-17 MED ORDER — CEPHALEXIN 500 MG PO CAPS: 500.0000 mg | ORAL_CAPSULE | Freq: Three times a day (TID) | ORAL | 0 refills | Status: DC

## 2017-04-17 MED ORDER — AMIODARONE HCL 200 MG PO TABS: 200.0000 mg | ORAL_TABLET | Freq: Every day | ORAL | Status: DC

## 2017-04-17 MED ORDER — AMLODIPINE BESYLATE 2.5 MG PO TABS: 2.5000 mg | ORAL_TABLET | Freq: Every day | ORAL | 0 refills | Status: DC

## 2017-04-17 MED ORDER — AMIODARONE HCL 200 MG PO TABS
400.0000 mg | ORAL_TABLET | Freq: Two times a day (BID) | ORAL | Status: DC
  Filled 2017-04-17: qty 2

## 2017-04-17 NOTE — Progress Notes (Signed)
Progress Note  Patient Name: Brittney MEXICANO Date of Encounter: 04/17/2017  Primary Cardiologist: Dr Percival Spanish  Subjective   She feels better today and was able to walk.  Inpatient Medications    Scheduled Meds: . amiodarone  200 mg Oral BID  . amLODipine  2.5 mg Oral Daily  . budesonide (PULMICORT) nebulizer solution  0.5 mg Nebulization BID  . enoxaparin (LOVENOX) injection  40 mg Subcutaneous Q24H  . fluticasone  2 spray Each Nare Daily  . furosemide  40 mg Intravenous Q6H  . guaiFENesin  1,200 mg Oral BID  . insulin aspart  0-15 Units Subcutaneous TID WC  . insulin aspart  0-5 Units Subcutaneous QHS  . levothyroxine  112 mcg Oral QAC breakfast  . loratadine  10 mg Oral Daily  . metoprolol succinate  100 mg Oral Daily  . potassium chloride  40 mEq Oral Daily  . senna  1 tablet Oral BID  . sodium chloride flush  10-40 mL Intracatheter Q12H   Continuous Infusions: .  ceFAZolin (ANCEF) IV Stopped (04/17/17 4431)   PRN Meds: albuterol, bisacodyl, ondansetron **OR** ondansetron (ZOFRAN) IV, polyethylene glycol, sodium chloride flush   Vital Signs    Vitals:   04/16/17 1935 04/16/17 2125 04/17/17 0539 04/17/17 0928  BP:  (!) 151/98 125/89   Pulse:  92 88   Resp:  18 18   Temp:  97.6 F (36.4 C) (!) 97.5 F (36.4 C)   TempSrc:  Oral Oral   SpO2: 96%  97% 96%  Weight:   232 lb 9.4 oz (105.5 kg)   Height:        Intake/Output Summary (Last 24 hours) at 04/17/2017 0946 Last data filed at 04/17/2017 5400 Gross per 24 hour  Intake 518.47 ml  Output 1300 ml  Net -781.53 ml   Filed Weights   04/15/17 0254 04/16/17 0448 04/17/17 0539  Weight: 241 lb 10 oz (109.6 kg) 234 lb 9.1 oz (106.4 kg) 232 lb 9.4 oz (105.5 kg)    Telemetry     a-fib, rate controlled- Personally Reviewed  Physical Exam   GEN: No acute distress.   Neck: No JVD Cardiac: RRR, no murmurs, rubs, or gallops.  Respiratory: Clear to auscultation bilaterally. GI: Soft, nontender, non-distended   MS: No edema; No deformity. Neuro:  Nonfocal  Psych: Normal affect   Labs    Chemistry Recent Labs  Lab 04/14/17 0407  04/15/17 0330 04/16/17 0452 04/17/17 0648  NA 135   < > 137 137 139  K 4.9   < > 4.1 3.2* 3.6  CL 110   < > 105 100* 103  CO2 18*   < > 25 28 28   GLUCOSE 121*   < > 149* 145* 122*  BUN 50*   < > 40* 32* 34*  CREATININE 1.15*   < > 1.08* 0.84 1.02*  CALCIUM 9.2   < > 9.2 8.7* 9.0  PROT 5.8*  --  6.1*  --  5.4*  ALBUMIN 2.2*  --  2.2*  --  2.0*  AST 132*  --  100*  --  48*  ALT 89*  --  80*  --  41  ALKPHOS 51  --  74  --  77  BILITOT 1.1  --  1.3*  --  1.6*  GFRNONAA 46*   < > 50* >60 53*  GFRAA 53*   < > 58* >60 >60  ANIONGAP 7   < > 7 9 8    < > =  values in this interval not displayed.     Hematology Recent Labs  Lab 04/15/17 0330 04/16/17 0452 04/17/17 0648  WBC 14.0* 11.5* 12.3*  RBC 4.49 4.84 5.24*  HGB 14.4 15.2* 16.3*  HCT 41.4 43.9 47.4*  MCV 92.2 90.7 90.5  MCH 32.1 31.4 31.1  MCHC 34.8 34.6 34.4  RDW 18.7* 18.1* 18.1*  PLT 215 209 245    Cardiac Enzymes Recent Labs  Lab 04/14/17 1125 04/14/17 1649 04/14/17 2322  TROPONINI 0.15* 0.16* 0.14*   No results for input(s): TROPIPOC in the last 168 hours.   BNP Recent Labs  Lab 04/14/17 1125  BNP 603.4*     DDimer No results for input(s): DDIMER in the last 168 hours.   Radiology    No results found.  Cardiac Studies   ECHO: 04/14/2017 - Left ventricle: The cavity size was normal. Wall thickness was   increased in a pattern of mild LVH. Systolic function was   moderately reduced. The estimated ejection fraction was in the   range of 35% to 40%. Diffuse hypokinesis. Indeterminant diastolic   function (atrial fibrillation). - Aortic valve: There was no stenosis. - Mitral valve: Mildly calcified annulus. There was mild   regurgitation. - Left atrium: The atrium was moderately dilated. - Right ventricle: The cavity size was normal. Systolic function   was normal. -  Right atrium: The atrium was mildly dilated. - Tricuspid valve: Peak RV-RA gradient (S): 24 mm Hg. - Systemic veins: IVC was not visualized. Impressions: - The patient was in atrial fibrillation. Normal LV size with mild   LV hypertrophy. EF 35-40%, diffuse hypokinesis. Normal RV size   and systolic function. Mild mitral regurgitation.   Patient Profile     76 y.o. female with a hx of PAF on flecainide, no anticoagulation, intol metop and Cardizem, cirrhosis, HTN, OSA on nocturnal O2, anemia, pre-DM, hypothyroid, diverticulosis, portal hypertensive gastropathy, esoph varices, was admitted 11-03 w/ CAP, acute resp  failure w/ hypoxia, reactive airway dz (started on Duo-nebs tid and steroids), UTI.  Flecainide initially continued, pt became volume overload and started on IV Lasix. 11/06, pt developed WCT, assoc w/ CP. Lopressor 5 mg given>>HR slowed, IV amio bolus>>afib>>SR later.   Assessment & Plan    1. VT: Pt seen by Dr Rayann Heman. -  VT in the setting of acute illness, w/ EF acutely depressed>>need to consider ischemia eval.  2. LV dysfunction  - new 35% to 40% from prior 60-65% 3. PAF:  - CHADS2VASC=4 (age x 1, female, HTN, CHF)   Increase PO amiodarone to 400 mg po BID x 3 days, followed by 200 mg po BID x 3 days, followed by 200 mg po daily. She is a poor candidate for anticoagulation given liver cirrhosis and esophageal varices. We will try to maintain the SR. Given decreased LVEF this is possibly sec to a-fib with RVR, she denies chest pain, she is a poor cath candidate given high risk of bleeding and overall poor prognosis. I have increased Toprol XL to 150 mg po daily. I would continue medical therapy and repeat echo in 2 months, if LVEF still low and the patient is symptomatic at that time I would continue a cardiac catheterization.  Replace potassium. Continue diuresis, good response so far and improving crea.   Ena Dawley, MD 04/17/2017, 9:46 AM

## 2017-04-17 NOTE — Discharge Summary (Signed)
PATIENT DETAILS Name: Brittney Valentine Age: 76 y.o. Sex: female Date of Birth: Jun 28, 1940 MRN: [id number]. Admitting Physician: Toy Baker, MD WYO:VZCHYIF, Gwyndolyn Saxon, MD  Admit Date: 04/11/2017 Discharge date: 04/17/2017  Recommendations for Outpatient Follow-up:  1. Follow up with PCP in 1-2 weeks 2. Please obtain BMP/CBC in one week 3. Please ensure follow-up with cardiology.   Admitted From:  Home  Disposition: Home with home health services/   Home Health:  Yes  Equipment/Devices: None  Discharge Condition: Stable  CODE STATUS:  DNR/  Diet recommendation:  Heart Healthy   Brief Summary: See H&P, Labs, Consult and Test reports for all details in brief, Patient is a 76 y.o. female with history of cirrhosis (NAS H) portal hypertension, esophageal varices, paroxysmal atrial fibrillation not on anticoagulation, hypertension admitted initially to the hospital for sepsis secondary to pneumonia/UTI. Hospital course complicated by sustained ventricular tachycardia. See below for further details.  Brief Hospital Course: Sepsis secondary to pneumonia and UTI: Sepsis pathophysiology has resolved, thought to be probably due to pneumonia and/or UTI. Cultures negative so far, urine culture positive for Escherichia coli. She was initially on broad-spectrum antimicrobial therapy, she was managed narrowed down to Ancef, on discharge we will transition to Keflex for a few more days.   Acute on chronic hypoxic respiratory failure: Likely secondary to pneumonia and decompensated CHF, much improved-of oxygen this morning. She is on marginal home O2 for presumed OSA  Sustained ventricular tachycardia: This occurred on 11/6-started on amiodarone infusion. Cardiology/EP following, per EP-not a candidate for ICD and advanced therapies, recommendations are to continue with amiodarone. She is no longer on flecainide. Spoke with Dr. Meda Coffee, okay to discharge today, continue  amiodarone  on discharge. Followed closely by cardiology during his hospital stay.   Acute on Chronic combined systolic and diastolic heart failure:  much improved-seems to be euvolemic-managed with IV Lasix-transition to oral Lasix on the day of discharge. As noted above, discussed with cardiology, they would arrange for outpatient follow-up, but stable for discharge from the point of view as well.  Echocardiogram on 11/6 showed EF around 35-40%.  Paroxysmal atrial fibrillation: Previously on flecainide-this has now been discontinued-patient has deemed not to be a candidate for anticoagulation due to bleeding risk-given history of cirrhosis. Chads2vasc score for at least 3. Currently on beta blocker and amiodarone. Please ensure follow-up with cardiology in the outpatient setting.   Acute kidney injury: Likely hemodynamically mediated-much improved, continue supportive care.  Hypertension: Currently stable-we will continue to follow closely-continue amlodipine, metoprolol and adjust accordingly.  Hypothyroidism: Continue Synthroid  History of NASH/liver cirrhosis: Currently compensated-follows with Dr. Rayetta Pigg endoscopy in 2013 showed portal gastropathy and varices. Follow.  OSA: Unable to tolerate CPAP-apparently on nocturnal O2.  Left upper extremity swelling: Likely secondary to IV infiltration-Doppler on 11/5 negative for DVT. Continue supportive care, seems to be   Procedures/Studies: 11/6>> echocardiogram: - The patient was in atrial fibrillation. Normal LV size with mild LV hypertrophy. EF 35-40%, diffuse hypokinesis. Normal RV size and systolic function. Mild mitral regurgitation.  11/4>> echocardiogram: Vigorous LV systolic function; elevated velocity across aortic valve (2.5 m/s) likely due to vigorous LV function as aortic valve appears to open well; mild LAE/RAE/RVE; mild TR; moderate pulmonary hypertension.   Discharge Diagnoses:  Principal Problem:    Sepsis ([place]) Active Problems:   Hepatic cirrhosis (H[facility])   Atrial fibrillation ([place])   Essential hypertension   Prediabetes   Hypothyroidism   Obstructive sleep apnea   Acute lower UTI  Acute respiratory failure with hypoxia (H[facility])   Leukopenia   RAD (reactive airway disease), mild intermittent, with acute exacerbation   Transaminitis   Volume overload   Ventricular tachycardia (HCC)   Dilated cardiomyopathy (H[facility])   Contraindication to anticoagulation therapy   Acute on chronic combined systolic and diastolic CHF (congestive heart failure) [facility])   Discharge Instructions:  Activity:  As tolerated with Full fall precautions use walker/cane & assistance as needed  Discharge Instructions    (HEART FAILURE PATIENTS) Call MD:  Anytime you have any of the following symptoms: 1) 3 pound weight gain in 24 hours or 5 pounds in 1 week 2) shortness of breath, with or without a dry hacking cough 3) swelling in the hands, feet or stomach 4) if you have to sleep on extra pillows at night in order to breathe.   Complete by:  As directed    Diet - low sodium heart healthy   Complete by:  As directed    Increase activity slowly   Complete by:  As directed      Allergies as of 04/17/2017      Reactions   Ciprofloxacin    Swelling   Robaxin [methocarbamol]    palpiations   Sulfonamide Derivatives    See little dots, skin feels like its burning   Verapamil    palpitations      Medication List    STOP taking these medications   flecainide 100 MG tablet Commonly known as:  TAMBOCOR   nystatin cream Commonly known as:  MYCOSTATIN   nystatin powder Commonly known as:  MYCOSTATIN/NYSTOP     TAKE these medications   amiodarone 200 MG tablet Commonly known as:  PACERONE PO amiodarone to 400 mg po BID x 3 days, followed by 200 mg po BID x 3 days, followed by 200 mg po daily.   amLODipine 2.5 MG tablet Commonly known as:  NORVASC Take 1 tablet (2.5 mg total) daily by mouth. Start  taking on:  04/18/2017   cephALEXin 500 MG capsule Commonly known as:  KEFLEX Take 1 capsule (500 mg total) 3 (three) times daily by mouth.   FISH OIL PO Take 1 tablet by mouth daily.   furosemide 40 MG tablet Commonly known as:  LASIX Take 1 tablet (40 mg total) 2 (two) times daily by mouth.   levothyroxine 112 MCG tablet Commonly known as:  SYNTHROID, LEVOTHROID TAKE 1 TABLET BY MOUTH ONCE DAILY IN THE MORNING   MAGNESIUM PO Take 1 tablet by mouth daily.   metoprolol succinate 50 MG 24 hr tablet Commonly known as:  TOPROL-XL Take 3 tablets (150 mg total) daily by mouth. Start taking on:  04/18/2017   potassium chloride SA 20 MEQ tablet Commonly known as:  K-DUR,KLOR-CON Take 2 tablets (40 mEq total) daily by mouth. Start taking on:  04/18/2017   Vitamin D-3 1000 units Caps Take 1,000 Units by mouth 2 (two) times daily.      Follow-up Information    Barrett, Evelene Croon, PA-C Follow up on 04/24/2017.   Specialties:  Cardiology, Radiology Why:  Please arrive at 8:45 AM for 9:00 AM appointment Contact information: [address] [place] [address] [phone]        Unk Pinto, MD. Schedule an appointment as soon as possible for a visit in 1 week(s).   Specialty:  Internal Medicine Contact information: [address] [place] [address] [phone]          Allergies  Allergen Reactions  . Ciprofloxacin     Swelling  . Robaxin [Methocarbamol]     palpiations  . Sulfonamide Derivatives     See little dots, skin feels like its burning  . Verapamil     palpitations    Consultations:   cardiology  Other Procedures/Studies: Dg Chest 2 View  Result Date: 04/11/2017 CLINICAL DATA:  76 year old female with right-sided flank pain radiating to abdomen. EXAM: CHEST  2 VIEW COMPARISON:  10/01/2014 FINDINGS: Cardiomediastinal silhouette is slightly enlarged. Note is made of low lung volumes with vascular congestion.  Increased opacity at the left lung base concerning for focal infiltrate. No significant pleural effusions or pneumothorax. No acute osseous abnormalities. IMPRESSION: Markedly low lung volumes with left basilar opacity concerning for focal infiltrate. Follow-up to resolution recommended. Electronically Signed   By: Kristopher Oppenheim M.D.   On: 04/11/2017 17:41   Ct Angio Chest Pe W Or Wo Contrast  Result Date: 04/11/2017 CLINICAL DATA:  Shortness of breath. Tachypnea and oxygen desaturation when ambulating. EXAM: CT ANGIOGRAPHY CHEST WITH CONTRAST TECHNIQUE: Multidetector CT imaging of the chest was performed using the standard protocol during bolus administration of intravenous contrast. Multiplanar CT image reconstructions and MIPs were obtained to evaluate the vascular anatomy. CONTRAST:  100 cc Isovue 370 IV COMPARISON:  Chest radiograph earlier this day. Abdominal CT earlier this day. FINDINGS: Cardiovascular: There are no filling defects within the pulmonary arteries to the segmental level suggest pulmonary embolus. Cannot assess subsegmental branches due to contrast bolus timing and soft tissue attenuation from habitus. Mild multi chamber cardiomegaly. Atherosclerosis of the thoracic aorta. No evidence of dissection. No pericardial fluid. Mediastinum/Nodes: Small hiatal hernia with large paraesophageal varices. No enlarged mediastinal or hilar nodes, multiple small mediastinal nodes are seen. Visualized thyroid gland is diminutive. Lungs/Pleura: Subsegmental atelectasis in the lingula, right middle lobe and both lower lobes, greater on the left. No confluent consolidation. Linear subpleural opacities favoring atelectasis over septal thickening. No pleural fluid. Upper Abdomen: Characterized on abdominal CT today. Cirrhosis and splenomegaly. Small amount of ascites. Perigastric and paraesophageal varices. Musculoskeletal: There are no acute or suspicious osseous abnormalities. Review of the MIP images  confirms the above findings. IMPRESSION: 1. No central pulmonary embolus. 2. Mild cardiomegaly.  Aortic Atherosclerosis (ICD10-I70.0). 3. Multifocal atelectasis, greater in the left lung. No pleural fluid. 4. Cirrhosis, splenomegaly, paraesophageal and perigastric varices and small amount of abdominal ascites. Electronically Signed   By: Jeb Levering M.D.   On: 04/11/2017 23:38   Dg Chest Port 1 View  Result Date: 04/14/2017 CLINICAL DATA:  Hypoxia. History of atrial fibrillation, cirrhosis, episodes of reactive airway disease and pneumonia. EXAM: PORTABLE CHEST 1 VIEW COMPARISON:  Chest x-ray and chest CT scan of April 11, 2017 FINDINGS: The lungs are well-expanded. The cardiac silhouette is enlarged. The pulmonary vascularity is engorged and there is cephalization of the vascular pattern. The interstitial markings are more conspicuous today. There is no large pleural effusion. There is calcification in the wall of the aortic arch. The bony thorax exhibits no acute abnormality. IMPRESSION: CHF with worsening of pulmonary interstitial edema. No acute pneumonia. Thoracic aortic atherosclerosis. Electronically Signed   By: David  Martinique M.D.   On: 04/14/2017 10:16   Ct Renal Stone Study  Result Date: 04/11/2017 CLINICAL DATA:  complaints of right sided flank pain radiating around into abdomen. Pain 10/10. Nausea/dry heaving. Denies urinary symptoms. Patient was vomiting yellow bile while she was on CT table. EXAM: CT ABDOMEN AND PELVIS WITHOUT CONTRAST TECHNIQUE:  Multidetector CT imaging of the abdomen and pelvis was performed following the standard protocol without IV contrast. COMPARISON:  10/02/2014 FINDINGS: Lower chest: No acute abnormality. Hepatobiliary: Small nodular liver consistent with advanced cirrhosis. Small cyst at the dome of the right lobe. No other liver mass or focal lesion. Status post cholecystectomy. No bile duct dilation. Pancreas: Several calcifications along the inferior margin  of the pancreatic head. No pancreatic mass or inflammation. Spleen: Enlarged measuring 19 x 8 x 14 cm. No splenic mass or focal lesion. Adrenals/Urinary Tract: No adrenal masses. Mild dilation of the right intrarenal collecting system. Portions of the proximal right ureter mildly dilated. No right ureteral stone. No right renal mass or intrarenal stone. No left renal mass. No left hydronephrosis. Small nonobstructing stone in the upper pole. Normal left ureter. Bladder is unremarkable. Stomach/Bowel: Status post right partial colectomy. Anastomosis staple line is noted in the right mid to lower abdomen. No colonic wall thickening. There are scattered diverticular the left colon without diverticulitis. Stomach is unremarkable. No small bowel obstruction or convincing inflammation. Small bowel anastomosis staple line noted in the right mid to lower abdomen. Vascular/Lymphatic: There are numerous vascular collaterals in the upper abdomen reflecting portal venous hypertension. These include paraesophageal collaterals, stable when compared to the prior CT. Mild aortic atherosclerosis. There scattered prominent gastrohepatic and retroperitoneal lymph nodes none of which are pathologically enlarged by size criteria. Reproductive: Status post hysterectomy. No adnexal masses. Other: Small amount of ascites collects adjacent to the liver and spleen and between the leaves of the small bowel mesenteric. No abdominal wall hernia. Musculoskeletal: No fracture or acute finding. No osteoblastic or osteolytic lesions. IMPRESSION: 1. There is mild dilation of the right intrarenal collecting system and portions of the proximal to mid right ureter, but no evidence of a ureteral stone. The possibility of a non radiopaque ureteral stone or recent passage of a stone should be considered given the presenting history. 2. No other evidence of an acute abnormality. 3. There changes of advanced cirrhosis with portal venous hypertension  reflected by extensive varices, splenomegaly and a small amount of ascites. Ascites has increased in amount from the prior CT. Other findings are stable. 4. Stable changes from bowel surgery. 5. Mild aortic atherosclerosis. Electronically Signed   By: Lajean Manes M.D.   On: 04/11/2017 16:05      TODAY-DAY OF DISCHARGE:  Subjective:   Brittney Valentine today has no headache,no chest abdominal pain,no new weakness tingling or numbness, feels much better wants to go home today.   Objective:   Blood pressure 125/89, pulse 88, temperature (!) 97.5 F (36.4 C), temperature source Oral, resp. rate 18, height 5\' 5"  (1.651 m), weight 105.5 kg (232 lb 9.4 oz), SpO2 96 %.  Intake/Output Summary (Last 24 hours) at 04/17/2017 1038 Last data filed at 04/17/2017 1000 Gross per 24 hour  Intake 440 ml  Output 1800 ml  Net -1360 ml   Filed Weights   04/15/17 0254 04/16/17 0448 04/17/17 0539  Weight: 109.6 kg (241 lb 10 oz) 106.4 kg (234 lb 9.1 oz) 105.5 kg (232 lb 9.4 oz)    Exam: Awake Alert, Oriented *3, No new F.N deficits, Normal affect [place].AT,PERRAL Supple Neck,No JVD, No cervical lymphadenopathy appriciated.  Symmetrical Chest wall movement, Good air movement bilaterally, CTAB RRR,No Gallops,Rubs or new Murmurs, No Parasternal Heave +ve B.Sounds, Abd Soft, Non tender, No organomegaly appriciated, No rebound -guarding or rigidity. No Cyanosis, Clubbing or edema, No new Rash or bruise   PERTINENT  RADIOLOGIC STUDIES: Dg Chest 2 View  Result Date: 04/11/2017 CLINICAL DATA:  76 year old female with right-sided flank pain radiating to abdomen. EXAM: CHEST  2 VIEW COMPARISON:  10/01/2014 FINDINGS: Cardiomediastinal silhouette is slightly enlarged. Note is made of low lung volumes with vascular congestion. Increased opacity at the left lung base concerning for focal infiltrate. No significant pleural effusions or pneumothorax. No acute osseous abnormalities. IMPRESSION: Markedly low lung volumes with  left basilar opacity concerning for focal infiltrate. Follow-up to resolution recommended. Electronically Signed   By: Kristopher Oppenheim M.D.   On: 04/11/2017 17:41   Ct Angio Chest Pe W Or Wo Contrast  Result Date: 04/11/2017 CLINICAL DATA:  Shortness of breath. Tachypnea and oxygen desaturation when ambulating. EXAM: CT ANGIOGRAPHY CHEST WITH CONTRAST TECHNIQUE: Multidetector CT imaging of the chest was performed using the standard protocol during bolus administration of intravenous contrast. Multiplanar CT image reconstructions and MIPs were obtained to evaluate the vascular anatomy. CONTRAST:  100 cc Isovue 370 IV COMPARISON:  Chest radiograph earlier this day. Abdominal CT earlier this day. FINDINGS: Cardiovascular: There are no filling defects within the pulmonary arteries to the segmental level suggest pulmonary embolus. Cannot assess subsegmental branches due to contrast bolus timing and soft tissue attenuation from habitus. Mild multi chamber cardiomegaly. Atherosclerosis of the thoracic aorta. No evidence of dissection. No pericardial fluid. Mediastinum/Nodes: Small hiatal hernia with large paraesophageal varices. No enlarged mediastinal or hilar nodes, multiple small mediastinal nodes are seen. Visualized thyroid gland is diminutive. Lungs/Pleura: Subsegmental atelectasis in the lingula, right middle lobe and both lower lobes, greater on the left. No confluent consolidation. Linear subpleural opacities favoring atelectasis over septal thickening. No pleural fluid. Upper Abdomen: Characterized on abdominal CT today. Cirrhosis and splenomegaly. Small amount of ascites. Perigastric and paraesophageal varices. Musculoskeletal: There are no acute or suspicious osseous abnormalities. Review of the MIP images confirms the above findings. IMPRESSION: 1. No central pulmonary embolus. 2. Mild cardiomegaly.  Aortic Atherosclerosis (ICD10-I70.0). 3. Multifocal atelectasis, greater in the left lung. No pleural fluid.  4. Cirrhosis, splenomegaly, paraesophageal and perigastric varices and small amount of abdominal ascites. Electronically Signed   By: Jeb Levering M.D.   On: 04/11/2017 23:38   Dg Chest Port 1 View  Result Date: 04/14/2017 CLINICAL DATA:  Hypoxia. History of atrial fibrillation, cirrhosis, episodes of reactive airway disease and pneumonia. EXAM: PORTABLE CHEST 1 VIEW COMPARISON:  Chest x-ray and chest CT scan of April 11, 2017 FINDINGS: The lungs are well-expanded. The cardiac silhouette is enlarged. The pulmonary vascularity is engorged and there is cephalization of the vascular pattern. The interstitial markings are more conspicuous today. There is no large pleural effusion. There is calcification in the wall of the aortic arch. The bony thorax exhibits no acute abnormality. IMPRESSION: CHF with worsening of pulmonary interstitial edema. No acute pneumonia. Thoracic aortic atherosclerosis. Electronically Signed   By: David  Martinique M.D.   On: 04/14/2017 10:16   Ct Renal Stone Study  Result Date: 04/11/2017 CLINICAL DATA:  complaints of right sided flank pain radiating around into abdomen. Pain 10/10. Nausea/dry heaving. Denies urinary symptoms. Patient was vomiting yellow bile while she was on CT table. EXAM: CT ABDOMEN AND PELVIS WITHOUT CONTRAST TECHNIQUE: Multidetector CT imaging of the abdomen and pelvis was performed following the standard protocol without IV contrast. COMPARISON:  10/02/2014 FINDINGS: Lower chest: No acute abnormality. Hepatobiliary: Small nodular liver consistent with advanced cirrhosis. Small cyst at the dome of the right lobe. No other liver mass or focal lesion. Status  post cholecystectomy. No bile duct dilation. Pancreas: Several calcifications along the inferior margin of the pancreatic head. No pancreatic mass or inflammation. Spleen: Enlarged measuring 19 x 8 x 14 cm. No splenic mass or focal lesion. Adrenals/Urinary Tract: No adrenal masses. Mild dilation of the right  intrarenal collecting system. Portions of the proximal right ureter mildly dilated. No right ureteral stone. No right renal mass or intrarenal stone. No left renal mass. No left hydronephrosis. Small nonobstructing stone in the upper pole. Normal left ureter. Bladder is unremarkable. Stomach/Bowel: Status post right partial colectomy. Anastomosis staple line is noted in the right mid to lower abdomen. No colonic wall thickening. There are scattered diverticular the left colon without diverticulitis. Stomach is unremarkable. No small bowel obstruction or convincing inflammation. Small bowel anastomosis staple line noted in the right mid to lower abdomen. Vascular/Lymphatic: There are numerous vascular collaterals in the upper abdomen reflecting portal venous hypertension. These include paraesophageal collaterals, stable when compared to the prior CT. Mild aortic atherosclerosis. There scattered prominent gastrohepatic and retroperitoneal lymph nodes none of which are pathologically enlarged by size criteria. Reproductive: Status post hysterectomy. No adnexal masses. Other: Small amount of ascites collects adjacent to the liver and spleen and between the leaves of the small bowel mesenteric. No abdominal wall hernia. Musculoskeletal: No fracture or acute finding. No osteoblastic or osteolytic lesions. IMPRESSION: 1. There is mild dilation of the right intrarenal collecting system and portions of the proximal to mid right ureter, but no evidence of a ureteral stone. The possibility of a non radiopaque ureteral stone or recent passage of a stone should be considered given the presenting history. 2. No other evidence of an acute abnormality. 3. There changes of advanced cirrhosis with portal venous hypertension reflected by extensive varices, splenomegaly and a small amount of ascites. Ascites has increased in amount from the prior CT. Other findings are stable. 4. Stable changes from bowel surgery. 5. Mild aortic  atherosclerosis. Electronically Signed   By: Lajean Manes M.D.   On: 04/11/2017 16:05     PERTINENT LAB RESULTS: CBC: Recent Labs    04/16/17 0452 04/17/17 0648  WBC 11.5* 12.3*  HGB 15.2* 16.3*  HCT 43.9 47.4*  PLT 209 245   CMET CMP     Component Value Date/Time   NA 139 04/17/2017 0648   K 3.6 04/17/2017 0648   CL 103 04/17/2017 0648   CO2 28 04/17/2017 0648   GLUCOSE 122 (H) 04/17/2017 0648   BUN 34 (H) 04/17/2017 0648   CREATININE 1.02 (H) 04/17/2017 0648   CREATININE 0.65 02/16/2017 1109   CALCIUM 9.0 04/17/2017 0648   PROT 5.4 (L) 04/17/2017 0648   ALBUMIN 2.0 (L) 04/17/2017 0648   AST 48 (H) 04/17/2017 0648   ALT 41 04/17/2017 0648   ALKPHOS 77 04/17/2017 0648   BILITOT 1.6 (H) 04/17/2017 0648   GFRNONAA 53 (L) 04/17/2017 0648   GFRNONAA 88 02/16/2017 1109   GFRAA >60 04/17/2017 0648   GFRAA 102 02/16/2017 1109    GFR Estimated Creatinine Clearance: 59.2 mL/min (A) (by C-G formula based on SCr of 1.02 mg/dL (H)). No results for input(s): LIPASE, AMYLASE in the last 72 hours. Recent Labs    04/14/17 1125 04/14/17 1649 04/14/17 2322  TROPONINI 0.15* 0.16* 0.14*   Invalid input(s): POCBNP No results for input(s): DDIMER in the last 72 hours. No results for input(s): HGBA1C in the last 72 hours. No results for input(s): CHOL, HDL, LDLCALC, TRIG, CHOLHDL, LDLDIRECT in the last 72  hours. Recent Labs    04/14/17 1125  TSH 1.345   No results for input(s): VITAMINB12, FOLATE, FERRITIN, TIBC, IRON, RETICCTPCT in the last 72 hours. Coags: No results for input(s): INR in the last 72 hours.  Invalid input(s): PT Microbiology: Recent Results (from the past 240 hour(s))  Urine culture     Status: Abnormal   Collection Time: 04/11/17  9:14 PM  Result Value Ref Range Status   Specimen Description URINE, RANDOM  Final   Special Requests NONE  Final   Culture >=100,000 COLONIES/mL ESCHERICHIA COLI (A)  Final   Report Status 04/14/2017 FINAL  Final    Organism ID, Bacteria ESCHERICHIA COLI (A)  Final      Susceptibility   Escherichia coli - MIC*    AMPICILLIN >=32 RESISTANT Resistant     CEFAZOLIN <=4 SENSITIVE Sensitive     CEFTRIAXONE <=1 SENSITIVE Sensitive     CIPROFLOXACIN <=0.25 SENSITIVE Sensitive     GENTAMICIN <=1 SENSITIVE Sensitive     IMIPENEM <=0.25 SENSITIVE Sensitive     NITROFURANTOIN <=16 SENSITIVE Sensitive     TRIMETH/SULFA >=320 RESISTANT Resistant     AMPICILLIN/SULBACTAM 16 INTERMEDIATE Intermediate     PIP/TAZO <=4 SENSITIVE Sensitive     Extended ESBL NEGATIVE Sensitive     * >=100,000 COLONIES/mL ESCHERICHIA COLI  Respiratory Panel by PCR     Status: None   Collection Time: 04/12/17  3:11 AM  Result Value Ref Range Status   Adenovirus NOT DETECTED NOT DETECTED Final   Coronavirus 229E NOT DETECTED NOT DETECTED Final   Coronavirus HKU1 NOT DETECTED NOT DETECTED Final   Coronavirus NL63 NOT DETECTED NOT DETECTED Final   Coronavirus OC43 NOT DETECTED NOT DETECTED Final   Metapneumovirus NOT DETECTED NOT DETECTED Final   Rhinovirus / Enterovirus NOT DETECTED NOT DETECTED Final   Influenza A NOT DETECTED NOT DETECTED Final   Influenza B NOT DETECTED NOT DETECTED Final   Parainfluenza Virus 1 NOT DETECTED NOT DETECTED Final   Parainfluenza Virus 2 NOT DETECTED NOT DETECTED Final   Parainfluenza Virus 3 NOT DETECTED NOT DETECTED Final   Parainfluenza Virus 4 NOT DETECTED NOT DETECTED Final   Respiratory Syncytial Virus NOT DETECTED NOT DETECTED Final   Bordetella pertussis NOT DETECTED NOT DETECTED Final   Chlamydophila pneumoniae NOT DETECTED NOT DETECTED Final   Mycoplasma pneumoniae NOT DETECTED NOT DETECTED Final    Comment: Performed at [facility], [place]. [address], [place], [place] [address]  Culture, blood (Routine X 2) w Reflex to ID Panel     Status: None (Preliminary result)   Collection Time: 04/12/17  1:42 PM  Result Value Ref Range Status   Specimen Description BLOOD BLOOD RIGHT ARM   Final   Special Requests   Final    BOTTLES DRAWN AEROBIC AND ANAEROBIC Blood Culture adequate volume   Culture   Final    NO GROWTH 4 DAYS Performed at [facility], [place] [address] [place], [place] [address]    Report Status PENDING  Incomplete  Culture, blood (Routine X 2) w Reflex to ID Panel     Status: None (Preliminary result)   Collection Time: 04/12/17  1:42 PM  Result Value Ref Range Status   Specimen Description BLOOD RIGHT ANTECUBITAL  Final   Special Requests IN PEDIATRIC BOTTLE Blood Culture adequate volume  Final   Culture   Final    NO GROWTH 4 DAYS Performed at [facility], [place] [address],  [place], [place] [address]    Report Status PENDING  Incomplete  MRSA PCR Screening     Status: None   Collection Time: 04/12/17  5:22 PM  Result Value Ref Range Status   MRSA by PCR NEGATIVE NEGATIVE Final    Comment:        The GeneXpert MRSA Assay (FDA approved for NASAL specimens only), is one component of a comprehensive MRSA colonization surveillance program. It is not intended to diagnose MRSA infection nor to guide or monitor treatment for MRSA infections.     FURTHER DISCHARGE INSTRUCTIONS:  Get Medicines reviewed and adjusted: Please take all your medications with you for your next visit with your Primary MD  Laboratory/radiological data: Please request your Primary MD to go over all hospital tests and procedure/radiological results at the follow up, please ask your Primary MD to get all Hospital records sent to his/her office.  In some cases, they will be blood work, cultures and biopsy results pending at the time of your discharge. Please request that your primary care M.D. goes through all the records of your hospital data and follows up on these results.  Also Note the following: If you experience worsening of your admission symptoms, develop shortness of breath, life threatening emergency, suicidal or homicidal thoughts you must seek medical  attention immediately by calling 911 or calling your MD immediately  if symptoms less severe.  You must read complete instructions/literature along with all the possible adverse reactions/side effects for all the Medicines you take and that have been prescribed to you. Take any new Medicines after you have completely understood and accpet all the possible adverse reactions/side effects.   Do not drive when taking Pain medications or sleeping medications (Benzodaizepines)  Do not take more than prescribed Pain, Sleep and Anxiety Medications. It is not advisable to combine anxiety,sleep and pain medications without talking with your primary care practitioner  Special Instructions: If you have smoked or chewed Tobacco  in the last 2 yrs please stop smoking, stop any regular Alcohol  and or any Recreational drug use.  Wear Seat belts while driving.  Please note: You were cared for by a hospitalist during your hospital stay. Once you are discharged, your primary care physician will handle any further medical issues. Please note that NO REFILLS for any discharge medications will be authorized once you are discharged, as it is imperative that you return to your primary care physician (or establish a relationship with a primary care physician if you do not have one) for your post hospital discharge needs so that they can reassess your need for medications and monitor your lab values.  Total Time spent coordinating discharge including counseling, education and face to face time equals 45 minutes.  SignedOren Binet 04/17/2017 10:38 AM

## 2017-04-17 NOTE — Progress Notes (Signed)
Occupational Therapy Treatment Patient Details Name: Brittney Valentine MRN: [id number] DOB: [birth year] Today's Date: 04/17/2017    History of present illness 76 y.o. female with history of cirrhosis (NASH) portal hypertension, esophageal varices, paroxysmal atrial fibrillation not on anticoagulation, hypertension, OSA, chronic anemia and admitted initially to the hospital for sepsis secondary to pneumonia/UTI. Hospital course complicated by sustained ventricular tachycardia; cardiology following.   OT comments  Performed ADL.  Pt wears depends at baseline due to decreased urinary control.  Felt dizzy in standing. Pt can return to sister's home. She really prefers her own.  Encouraged her to stay with sister initially while she recovers her strength  Follow Up Recommendations  Home health OT . Recommend 24/7   Equipment Recommendations  None recommended by OT    Recommendations for Other Services      Precautions / Restrictions Precautions Precautions: Fall Precaution Comments: monitor vitals Restrictions Weight Bearing Restrictions: No       Mobility Bed Mobility         Supine to sit: Supervision     General bed mobility comments: used bedrail  Transfers Overall transfer level: Needs assistance Equipment used: None Transfers: Sit to/from Stand Sit to Stand: Min guard         General transfer comment: min/guard for safety    Balance                                           ADL either performed or assessed with clinical judgement   ADL           Upper Body Bathing: Set up;Sitting   Lower Body Bathing: Minimal assistance;Sit to/from stand       Lower Body Dressing: Sit to/from stand;Moderate assistance   Toilet Transfer: Min guard;Stand-pivot;BSC;RW   Toileting- Water quality scientist and Hygiene: Min guard;Sit to/from stand         General ADL Comments: performed bathing, changed gown and performed toilet transfer.  Pt can stay  with sister but she prefers home. Encouraged her to stay with sister initially for help/support     Vision       Perception     Praxis      Cognition Arousal/Alertness: Awake/alert Behavior During Therapy: WFL for tasks assessed/performed Overall Cognitive Status: Within Functional Limits for tasks assessed                                          Exercises     Shoulder Instructions       General Comments pt felt dizzy after standing for a couple of minutes during adls: seated BP 440/10. Felt better once seated    Pertinent Vitals/ Pain       Pain Assessment: No/denies pain  Home Living Family/patient expects to be discharged to:: Private residence Living Arrangements: Alone                 Bathroom Shower/Tub: Teacher, early years/pre: Handicapped height     Home Equipment: None   Additional Comments: may stay with sister.  Prefers home      Prior Functioning/Environment Level of Independence: Independent            Frequency           Progress Toward Goals  OT Goals(current goals can now be found in the care plan section)  Progress towards OT goals: Progressing toward goals     Plan      Co-evaluation                 AM-PAC PT "6 Clicks" Daily Activity     Outcome Measure   Help from another person eating meals?: None Help from another person taking care of personal grooming?: A Little Help from another person toileting, which includes using toliet, bedpan, or urinal?: A Little Help from another person bathing (including washing, rinsing, drying)?: A Little Help from another person to put on and taking off regular upper body clothing?: A Little Help from another person to put on and taking off regular lower body clothing?: A Lot 6 Click Score: 18    End of Session    OT Visit Diagnosis: Unsteadiness on feet (R26.81)   Activity Tolerance Patient tolerated treatment well   Patient Left in  chair;with call bell/phone within reach;with chair alarm set   Nurse Communication          Time: 8270-7867 OT Time Calculation (min): 29 min  Charges: OT General Charges $OT Visit: 1 Visit OT Treatments $Self Care/Home Management : 8-22 mins $Therapeutic Activity: 8-22 mins  Lesle Chris, OTR/L [phone] 04/17/2017   Taiana Temkin 04/17/2017, 12:16 PM

## 2017-04-17 NOTE — Progress Notes (Signed)
Physical Therapy Treatment Patient Details Name: Brittney Valentine MRN: [id number] DOB: 1940/11/08 Today's Date: 04/17/2017    History of Present Illness 76 y.o. female with history of cirrhosis (NASH) portal hypertension, esophageal varices, paroxysmal atrial fibrillation not on anticoagulation, hypertension, OSA, chronic anemia and admitted initially to the hospital for sepsis secondary to pneumonia/UTI. Hospital course complicated by sustained ventricular tachycardia; cardiology following.    PT Comments    Pt agreeable to ambulate and reports dizziness during ambulation.  BP 95/64 mmHg upon returning to recliner (RN aware).  Pt to d/c home later today.  Follow Up Recommendations  Home health PT;Supervision - Intermittent     Equipment Recommendations  None recommended by PT(pt states she can obtain a cane if still feeling unsteady)    Recommendations for Other Services       Precautions / Restrictions Precautions Precautions: Fall Precaution Comments: monitor vitals Restrictions Weight Bearing Restrictions: No    Mobility  Bed Mobility               General bed mobility comments: pt up in recliner on arrival  Transfers Overall transfer level: Needs assistance Equipment used: None Transfers: Sit to/from Stand Sit to Stand: Min guard         General transfer comment: min/guard for safety  Ambulation/Gait Ambulation/Gait assistance: Min guard Ambulation Distance (Feet): 180 Feet Assistive device: None Gait Pattern/deviations: Step-through pattern;Decreased stride length     General Gait Details: Pt with occasional grazing of hand rail, reports dizziness during ambulation, did not become worse; upon return to recliner vitals: 96% room air, 98 bpm, 95/64 mmHg (RN present and aware)   Stairs            Wheelchair Mobility    Modified Rankin (Stroke Patients Only)       Balance                                             Cognition Arousal/Alertness: Awake/alert Behavior During Therapy: WFL for tasks assessed/performed Overall Cognitive Status: Within Functional Limits for tasks assessed                                        Exercises      General Comments        Pertinent Vitals/Pain Pain Assessment: No/denies pain    Home Living Family/patient expects to be discharged to:: Private residence Living Arrangements: Alone           Home Equipment: None Additional Comments: may stay with sister.  Prefers home    Prior Function Level of Independence: Independent          PT Goals (current goals can now be found in the care plan section) Progress towards PT goals: Progressing toward goals    Frequency    Min 3X/week      PT Plan Current plan remains appropriate    Co-evaluation              AM-PAC PT "6 Clicks" Daily Activity  Outcome Measure  Difficulty turning over in bed (including adjusting bedclothes, sheets and blankets)?: None Difficulty moving from lying on back to sitting on the side of the bed? : A Little Difficulty sitting down on and standing up from a chair with arms (e.g., wheelchair,  bedside commode, etc,.)?: A Little Help needed moving to and from a bed to chair (including a wheelchair)?: A Little Help needed walking in hospital room?: A Little Help needed climbing 3-5 steps with a railing? : A Little 6 Click Score: 19    End of Session Equipment Utilized During Treatment: Gait belt Activity Tolerance: Patient tolerated treatment well Patient left: in chair;with call bell/phone within reach;with nursing/sitter in room   PT Visit Diagnosis: Difficulty in walking, not elsewhere classified (R26.2)     Time: 7290-2111 PT Time Calculation (min) (ACUTE ONLY): 11 min  Charges:  $Gait Training: 8-22 mins                    G Codes:      Carmelia Bake, PT, DPT 04/17/2017 Pager: [phone]  York Ram E 04/17/2017, 12:10 PM

## 2017-04-17 NOTE — Care Management Note (Signed)
Case Management Note  Patient Details  Name: CALIANNA KIM MRN: [id number] Date of Birth: 04-04-1941  Subjective/Objective:  PT recc HHPT. Ordered for HHRN/HHPT. Patient declines HHRN.[facility] chosen for HHPT-rep Santiago Glad aware. No further CM needs.                  Action/Plan:d/c home w/HHPT   Expected Discharge Date:  04/17/17               Expected Discharge Plan:  [place]  In-House Referral:     Discharge planning Services  CM Consult  Post Acute Care Choice:    Choice offered to:  Patient  DME Arranged:    DME Agency:     HH Arranged:  PT [place]:  [place]  Status of Service:  Completed, signed off  If discussed at [place] of Stay Meetings, dates discussed:    Additional Comments:  Dessa Phi, RN 04/17/2017, 11:54 AM

## 2017-04-20 NOTE — Telephone Encounter (Signed)
[email or web address] 

## 2017-04-21 NOTE — Telephone Encounter (Signed)
04/11/2017 - 04/17/2017 (6 days) [facility]

## 2017-04-21 NOTE — Telephone Encounter (Signed)
[email or web address] 

## 2017-04-22 ENCOUNTER — Ambulatory Visit: Payer: PPO | Admitting: Physician Assistant

## 2017-04-22 ENCOUNTER — Encounter: Payer: Self-pay | Admitting: Physician Assistant

## 2017-04-22 VITALS — BP 130/74 | HR 78 | Temp 97.5°F | Resp 16 | Ht 64.0 in | Wt 230.8 lb

## 2017-04-22 DIAGNOSIS — I472 Ventricular tachycardia, unspecified: Secondary | ICD-10-CM

## 2017-04-22 DIAGNOSIS — I5043 Acute on chronic combined systolic (congestive) and diastolic (congestive) heart failure: Secondary | ICD-10-CM

## 2017-04-22 DIAGNOSIS — A419 Sepsis, unspecified organism: Secondary | ICD-10-CM

## 2017-04-22 DIAGNOSIS — J9601 Acute respiratory failure with hypoxia: Secondary | ICD-10-CM | POA: Diagnosis not present

## 2017-04-22 DIAGNOSIS — Z5309 Procedure and treatment not carried out because of other contraindication: Secondary | ICD-10-CM

## 2017-04-22 DIAGNOSIS — I420 Dilated cardiomyopathy: Secondary | ICD-10-CM

## 2017-04-22 DIAGNOSIS — K7469 Other cirrhosis of liver: Secondary | ICD-10-CM

## 2017-04-22 DIAGNOSIS — I480 Paroxysmal atrial fibrillation: Secondary | ICD-10-CM | POA: Diagnosis not present

## 2017-04-22 DIAGNOSIS — I8500 Esophageal varices without bleeding: Secondary | ICD-10-CM

## 2017-04-22 LAB — BASIC METABOLIC PANEL WITH GFR
BUN / CREAT RATIO: 39 (calc) — AB (ref 6–22)
BUN: 47 mg/dL — AB (ref 7–25)
CHLORIDE: 104 mmol/L (ref 98–110)
CO2: 29 mmol/L (ref 20–32)
Calcium: 9.7 mg/dL (ref 8.6–10.4)
Creat: 1.19 mg/dL — ABNORMAL HIGH (ref 0.60–0.93)
GFR, EST AFRICAN AMERICAN: 52 mL/min/{1.73_m2} — AB (ref >=60)
GFR, EST NON AFRICAN AMERICAN: 45 mL/min/{1.73_m2} — AB (ref >=60)
Glucose, Bld: 84 mg/dL (ref 65–99)
Potassium: 4.5 mmol/L (ref 3.5–5.3)
Sodium: 141 mmol/L (ref 135–146)

## 2017-04-22 LAB — CBC WITH DIFFERENTIAL/PLATELET
BASOS ABS: 59 {cells}/uL (ref 0–200)
Basophils Relative: 0.5 %
EOS ABS: 105 {cells}/uL (ref 15–500)
Eosinophils Relative: 0.9 %
HEMATOCRIT: 41.9 % (ref 35.0–45.0)
Hemoglobin: 14.9 g/dL (ref 11.7–15.5)
LYMPHS ABS: 1404 {cells}/uL (ref 850–3900)
MCH: 31.5 pg (ref 27.0–33.0)
MCHC: 35.6 g/dL (ref 32.0–36.0)
MCV: 88.6 fL (ref 80.0–100.0)
MPV: 11.4 fL (ref 7.5–12.5)
Monocytes Relative: 12.8 %
NEUTROS PCT: 73.8 %
Neutro Abs: 8635 cells/uL — ABNORMAL HIGH (ref 1500–7800)
PLATELETS: 285 10*3/uL (ref 140–400)
RBC: 4.73 10*6/uL (ref 3.80–5.10)
RDW: 15.5 % — AB (ref 11.0–15.0)
Total Lymphocyte: 12 %
WBC: 11.7 10*3/uL — ABNORMAL HIGH (ref 3.8–10.8)
WBCMIX: 1498 {cells}/uL — AB (ref 200–950)

## 2017-04-22 LAB — TSH: TSH: 11.13 mIU/L — ABNORMAL HIGH (ref 0.40–4.50)

## 2017-04-22 MED ORDER — RANITIDINE HCL 300 MG PO TABS: 300.0000 mg | ORAL_TABLET | Freq: Every day | ORAL | 1 refills | Status: DC

## 2017-04-22 NOTE — Progress Notes (Signed)
Hospital follow up  Assessment and Plan: Hospital visit follow up for   Ventricular tachycardia ([place]) Continue amiodarone, has follow up with cardio in 2 days -     Ambulatory referral to [place]  Acute on chronic combined systolic and diastolic CHF (congestive heart failure) (HCC) Weight is stable, continue to monitor at home Has follow up with cardio in 2 days -     CBC with Differential/Platelet -     BASIC METABOLIC PANEL WITH GFR -     Ambulatory referral to [place]  Acute respiratory failure with hypoxia (HCC) O2 good in the office, will monitor, has resolved -     Ambulatory referral to [place]  Sepsis, due to unspecified organism [facility]) -     Ambulatory referral to [place] -     TSH  Paroxysmal atrial fibrillation ([place]) IRREG IRREG AT THIS TIME but no symptoms from it, may need to stay at 2 amiodarone or have increase in BB to 200mg  a day, wants to wait to see cardio 2 days, if continue irreg suggest taking 4 of the BB a day to patient -     Ambulatory referral to [place]  Esophageal varices without bleeding, unspecified esophageal varices type ([place]) Continue Gi follow up  Dilated cardiomyopathy ([place]) [place] RISK/WEAKNESS, WILL CHECK TO SEE IF [place] -     Ambulatory referral to [place]  Other cirrhosis of liver ([place]) Continue Gi follow up  Contraindication to anticoagulation therapy Monitor, understands risk  Other orders -     ranitidine (ZANTAC) 300 MG tablet; Take 1 tablet (300 mg total) at bedtime by mouth.  Hospital discharge meds were reviewed, and reconciled with the patient.    There are no discontinued medications.  Over 40 minutes of exam, counseling, chart review, and complex, high/moderate level critical decision making was performed this visit.   Future Appointments  Date Time Provider [place]  04/24/2017  9:00 AM Barrett, Felisa Bonier [facility]  05/20/2017  2:30 PM  Unk Pinto, MD [facility] None  02/17/2018 10:00 AM Vicie Mutters, PA-C [facility] None     HPI 76 y.o.female presents for follow up for transition from recent hospitalization or SNIF stay. Admit date to the hospital was 04/11/17, patient was discharged from the hospital on 04/17/17 and our clinical staff contacted the office the day after discharge to set up a follow up appointment. The discharge summary, medications, and diagnostic test results were reviewed before meeting with the patient. The patient was admitted for a very complicated stay. She had sepsis due to UTI/pneumonia complicated by acute on chronic respiratory failure and acute on chronic CHF. In addition she has afib but has sustained VT in the hospital, she was taken off flecanide and transitioned to amiodarone, she is currently on 1 pill twice a day. Her EF was 35-40% in the hospital. She is not on anticoagulation therapy due to her bleeding risk from cirrhosis. She was discharged on keflex and needs a BMP/CBC today.   She has weakness, sister is making food for her at night, making meals is difficult. She is staying at her sisters, but normally lives independently and by her self and would like to get back to that.   She states she feels that she has been having irregular heart beat or "butterfly feeling", intermittent, she denies any accompaniments with it, no fatigue, SOB, nausea, dizziness, happens when she is lying down. She has had some  GERD and feels very full. Weight is stable.   Blood pressure 130/74, pulse 78, temperature (!) 97.5 F (36.4 C), resp. rate 16, height 5\' 4"  (1.626 m), weight 230 lb 12.8 oz (104.7 kg), SpO2 94 %.  BMI is Body mass index is 39.62 kg/m. Wt Readings from Last 3 Encounters:  04/22/17 230 lb 12.8 oz (104.7 kg)  04/17/17 232 lb 9.4 oz (105.5 kg)  02/24/17 226 lb 6.4 oz (102.7 kg)   Lab Results  Component Value Date   CREATININE 1.02 (H) 04/17/2017   BUN 34 (H) 04/17/2017   NA 139  04/17/2017   K 3.6 04/17/2017   CL 103 04/17/2017   CO2 28 04/17/2017   Lab Results  Component Value Date   WBC 12.3 (H) 04/17/2017   HGB 16.3 (H) 04/17/2017   HCT 47.4 (H) 04/17/2017   MCV 90.5 04/17/2017   PLT 245 04/17/2017   Home health is not involved, she is suppose to have home health but this has not been set up yet. Called her sister and has not called back.   Images while in the hospital: Dg Chest 2 View  Result Date: 04/11/2017 CLINICAL DATA:  76 year old female with right-sided flank pain radiating to abdomen. EXAM: CHEST  2 VIEW COMPARISON:  10/01/2014 FINDINGS: Cardiomediastinal silhouette is slightly enlarged. Note is made of low lung volumes with vascular congestion. Increased opacity at the left lung base concerning for focal infiltrate. No significant pleural effusions or pneumothorax. No acute osseous abnormalities. IMPRESSION: Markedly low lung volumes with left basilar opacity concerning for focal infiltrate. Follow-up to resolution recommended. Electronically Signed   By: Kristopher Oppenheim M.D.   On: 04/11/2017 17:41   Ct Angio Chest Pe W Or Wo Contrast  Result Date: 04/11/2017 CLINICAL DATA:  Shortness of breath. Tachypnea and oxygen desaturation when ambulating. EXAM: CT ANGIOGRAPHY CHEST WITH CONTRAST TECHNIQUE: Multidetector CT imaging of the chest was performed using the standard protocol during bolus administration of intravenous contrast. Multiplanar CT image reconstructions and MIPs were obtained to evaluate the vascular anatomy. CONTRAST:  100 cc Isovue 370 IV COMPARISON:  Chest radiograph earlier this day. Abdominal CT earlier this day. FINDINGS: Cardiovascular: There are no filling defects within the pulmonary arteries to the segmental level suggest pulmonary embolus. Cannot assess subsegmental branches due to contrast bolus timing and soft tissue attenuation from habitus. Mild multi chamber cardiomegaly. Atherosclerosis of the thoracic aorta. No evidence of  dissection. No pericardial fluid. Mediastinum/Nodes: Small hiatal hernia with large paraesophageal varices. No enlarged mediastinal or hilar nodes, multiple small mediastinal nodes are seen. Visualized thyroid gland is diminutive. Lungs/Pleura: Subsegmental atelectasis in the lingula, right middle lobe and both lower lobes, greater on the left. No confluent consolidation. Linear subpleural opacities favoring atelectasis over septal thickening. No pleural fluid. Upper Abdomen: Characterized on abdominal CT today. Cirrhosis and splenomegaly. Small amount of ascites. Perigastric and paraesophageal varices. Musculoskeletal: There are no acute or suspicious osseous abnormalities. Review of the MIP images confirms the above findings. IMPRESSION: 1. No central pulmonary embolus. 2. Mild cardiomegaly.  Aortic Atherosclerosis (ICD10-I70.0). 3. Multifocal atelectasis, greater in the left lung. No pleural fluid. 4. Cirrhosis, splenomegaly, paraesophageal and perigastric varices and small amount of abdominal ascites. Electronically Signed   By: Jeb Levering M.D.   On: 04/11/2017 23:38   Ct Renal Stone Study  Result Date: 04/11/2017 CLINICAL DATA:  complaints of right sided flank pain radiating around into abdomen. Pain 10/10. Nausea/dry heaving. Denies urinary symptoms. Patient was vomiting yellow bile while  she was on CT table. EXAM: CT ABDOMEN AND PELVIS WITHOUT CONTRAST TECHNIQUE: Multidetector CT imaging of the abdomen and pelvis was performed following the standard protocol without IV contrast. COMPARISON:  10/02/2014 FINDINGS: Lower chest: No acute abnormality. Hepatobiliary: Small nodular liver consistent with advanced cirrhosis. Small cyst at the dome of the right lobe. No other liver mass or focal lesion. Status post cholecystectomy. No bile duct dilation. Pancreas: Several calcifications along the inferior margin of the pancreatic head. No pancreatic mass or inflammation. Spleen: Enlarged measuring 19 x 8 x 14  cm. No splenic mass or focal lesion. Adrenals/Urinary Tract: No adrenal masses. Mild dilation of the right intrarenal collecting system. Portions of the proximal right ureter mildly dilated. No right ureteral stone. No right renal mass or intrarenal stone. No left renal mass. No left hydronephrosis. Small nonobstructing stone in the upper pole. Normal left ureter. Bladder is unremarkable. Stomach/Bowel: Status post right partial colectomy. Anastomosis staple line is noted in the right mid to lower abdomen. No colonic wall thickening. There are scattered diverticular the left colon without diverticulitis. Stomach is unremarkable. No small bowel obstruction or convincing inflammation. Small bowel anastomosis staple line noted in the right mid to lower abdomen. Vascular/Lymphatic: There are numerous vascular collaterals in the upper abdomen reflecting portal venous hypertension. These include paraesophageal collaterals, stable when compared to the prior CT. Mild aortic atherosclerosis. There scattered prominent gastrohepatic and retroperitoneal lymph nodes none of which are pathologically enlarged by size criteria. Reproductive: Status post hysterectomy. No adnexal masses. Other: Small amount of ascites collects adjacent to the liver and spleen and between the leaves of the small bowel mesenteric. No abdominal wall hernia. Musculoskeletal: No fracture or acute finding. No osteoblastic or osteolytic lesions. IMPRESSION: 1. There is mild dilation of the right intrarenal collecting system and portions of the proximal to mid right ureter, but no evidence of a ureteral stone. The possibility of a non radiopaque ureteral stone or recent passage of a stone should be considered given the presenting history. 2. No other evidence of an acute abnormality. 3. There changes of advanced cirrhosis with portal venous hypertension reflected by extensive varices, splenomegaly and a small amount of ascites. Ascites has increased in  amount from the prior CT. Other findings are stable. 4. Stable changes from bowel surgery. 5. Mild aortic atherosclerosis. Electronically Signed   By: Lajean Manes M.D.   On: 04/11/2017 16:05    Past Medical History:  Diagnosis Date  . Anemia   . Blood transfusion without reported diagnosis   . Cirrhosis ([place])   . Degenerative disk disease   . Diverticulosis   . Hemorrhoids   . Hypertension   . Kidney stones   . Obesity   . Paroxysmal atrial fibrillation (HCC)   . Partial small bowel obstruction ([place])   . Personal history of colonic polyps 03/25/2012   8 mm rectal adenoma 03/2012  . Portal hypertensive gastropathy ([place])   . Shingles   . Thyroid disease   . Varices, esophageal ([place])   . Zoster      Allergies  Allergen Reactions  . Ciprofloxacin     Swelling  . Robaxin [Methocarbamol]     palpiations  . Sulfonamide Derivatives     See little dots, skin feels like its burning  . Verapamil     palpitations      Current Outpatient Medications on File Prior to Visit  Medication Sig Dispense Refill  . amiodarone (PACERONE) 200 MG tablet PO amiodarone to 400 mg  po BID x 3 days, followed by 200 mg po BID x 3 days, followed by 200 mg po daily. 120 tablet 0  . amLODipine (NORVASC) 2.5 MG tablet Take 1 tablet (2.5 mg total) daily by mouth. 30 tablet 0  . cephALEXin (KEFLEX) 500 MG capsule Take 1 capsule (500 mg total) 3 (three) times daily by mouth. 9 capsule 0  . Cholecalciferol (VITAMIN D-3) 1000 UNITS CAPS Take 1,000 Units by mouth 2 (two) times daily.     . furosemide (LASIX) 40 MG tablet Take 1 tablet (40 mg total) 2 (two) times daily by mouth. 60 tablet 0  . levothyroxine (SYNTHROID, LEVOTHROID) 112 MCG tablet TAKE 1 TABLET BY MOUTH ONCE DAILY IN THE MORNING 90 tablet 0  . MAGNESIUM PO Take 1 tablet by mouth daily.    . metoprolol succinate (TOPROL-XL) 50 MG 24 hr tablet Take 3 tablets (150 mg total) daily by mouth. 90 tablet 0  . Omega-3 Fatty Acids (FISH OIL PO) Take 1  tablet by mouth daily.    . potassium chloride SA (K-DUR,KLOR-CON) 20 MEQ tablet Take 2 tablets (40 mEq total) daily by mouth. 60 tablet 0   No current facility-administered medications on file prior to visit.     ROS: all negative except above.   Physical Exam: Filed Weights   04/22/17 1601  Weight: 230 lb 12.8 oz (104.7 kg)   BP 130/74   Pulse 78   Temp (!) 97.5 F (36.4 C)   Resp 16   Ht 5\' 4"  (1.626 m)   Wt 230 lb 12.8 oz (104.7 kg)   SpO2 94%   BMI 39.62 kg/m  General Appearance: Well nourished well developed, in no apparent distress.  Eyes: PERRLA, EOMs, conjunctiva no swelling or erythema ENT/Mouth: Ear canals normal without obstruction, swelling, erythema, or discharge.  TMs normal bilaterally with no erythema,   Oropharynx moist and clear with no exudate, erythema, or swelling.   Neck: Supple, thyroid normal. No bruits.  No cervical adenopathy Respiratory: Respiratory effort normal, Breath sounds clear A&P without wheeze, rhonchi, rales.   Cardio: IRREG IRREG with 3/6 systolic murmurs, rubs or gallops. Brisk peripheral pulses with 1-2+ edema.  Chest: symmetric, with normal excursions Abdomen: Soft, nontender, no guarding, rebound, hernias, masses, or organomegaly.  Lymphatics: Non tender without lymphadenopathy.  Musculoskeletal: Full ROM all peripheral extremities,4/5 strength generalized weakness, and normal gait.  Neuro: Awake and oriented X 3, Cranial nerves intact, reflexes equal bilaterally. Normal muscle tone, no cerebellar symptoms.  Psych:  normal affect, Insight and Judgment appropriate.   Vicie Mutters, PA-C 5:41 PM [facility]

## 2017-04-22 NOTE — Telephone Encounter (Signed)
TCM outreach attempt # [phone]  [id number]

## 2017-04-24 ENCOUNTER — Ambulatory Visit: Payer: PPO | Admitting: Physician Assistant

## 2017-04-24 ENCOUNTER — Encounter: Payer: Self-pay | Admitting: Physician Assistant

## 2017-04-24 VITALS — BP 128/70 | HR 103 | Ht 64.0 in | Wt 233.0 lb

## 2017-04-24 DIAGNOSIS — I472 Ventricular tachycardia, unspecified: Secondary | ICD-10-CM

## 2017-04-24 DIAGNOSIS — I429 Cardiomyopathy, unspecified: Secondary | ICD-10-CM

## 2017-04-24 DIAGNOSIS — I5022 Chronic systolic (congestive) heart failure: Secondary | ICD-10-CM | POA: Diagnosis not present

## 2017-04-24 DIAGNOSIS — I481 Persistent atrial fibrillation: Secondary | ICD-10-CM | POA: Diagnosis not present

## 2017-04-24 DIAGNOSIS — I4819 Other persistent atrial fibrillation: Secondary | ICD-10-CM

## 2017-04-24 MED ORDER — METOPROLOL SUCCINATE ER 100 MG PO TB24: ORAL_TABLET | ORAL | 1 refills | Status: DC

## 2017-04-24 NOTE — Progress Notes (Signed)
Cardiology Office Note   Date:  04/24/2017   ID:  Brittney Valentine, Brittney Valentine Aug 22, 1940, MRN [id number]  PCP:  Unk Pinto, MD  Cardiologist: Dr. Percival Spanish, 05/05/2017 Rosaria Ferries, PA-C in hospital, 04/17/2017  Chief Complaint  Patient presents with  . Follow-up  . Fatigue    History of Present Illness: Brittney Valentine is a 76 y.o. female with a history of PAF on flecainide, no anticoagulation, intol Cardizem, cirrhosis, HTN, OSA on nocturnal O2, anemia, pre-DM, hypothyroid, diverticulosis, portal hypertensive gastropathy, esoph varices.   Admitted 11/03-11/09  w/ CAP, acute resp failure w/ hypoxia, reactive airway dz (started on Duo-nebs tid and steroids), UTI.  Flecainide was initially continued, volume overload>> IV Lasix. WCT seen associated with chest pain.  Flecainide discontinued, patient started on amiodarone to 400 mg po BID x 3 days, followed by 200 mg po BID x 3 days, followed by 200 mg po daily.  Dr. Rayann Heman saw and stated that the ECG was consistent with ventricular tachycardia.  She is not a candidate for an ICD given her comorbidities, DNRI status, and advanced cirrhosis. She is a poor candidate for anticoagulation given liver cirrhosis and esophageal varices.  Need to maintain SR. She is a poor cath candidate given high risk of bleeding and overall poor prognosis.  On Toprol XL 150 mg po daily. Repeat echo in 2 months, if LVEF still low and the patient is symptomatic at that time, consider cath.  She went to her sister's house in [place] after discharge.   Brittney Valentine presents for cardiology follow up.  She is doing ok since d/c. She feels her breathing is pretty good. She gets daytime LE edema, but does not wake with it. She denies PND or orthopnea. She can walk room-room in the house, but feels she has a long way to go to get back to normal. She has not been weighing, will try to start.   She was eating prepared foods, does not add salt to foods.   She is  having palpitations again. They have been constant ever since she left the hospital. Previously, she would take a Flecainide and it would get better. Now, the palpitations are constant. They do not make her SOB or give her chest pain. She has not been light-headed or dizzy.  However, she feels them constantly.  They make her feel a little weak.  She takes more pills than she used to, wonders if that will decrease.   Wants to go back to her own house, but has not really thought about how that will change her activity level.  Currently, she does not feel strong enough to carry loads of laundry, carrying bags of groceries, or do any significant cleaning.  She says that she cannot afford a home health aide.  She wonders if she needs electrolyte supplementation.   Past Medical History:  Diagnosis Date  . Anemia   . Blood transfusion without reported diagnosis   . Cirrhosis ([place])   . Degenerative disk disease   . Diverticulosis   . Hemorrhoids   . Hypertension   . Kidney stones   . Obesity   . Paroxysmal atrial fibrillation (HCC)   . Partial small bowel obstruction ([place])   . Personal history of colonic polyps 03/25/2012   8 mm rectal adenoma 03/2012  . Portal hypertensive gastropathy ([place])   . Shingles   . Thyroid disease   . Varices, esophageal ([place])   . Zoster     Past  Surgical History:  Procedure Laterality Date  . ABDOMINAL HYSTERECTOMY    . APPENDECTOMY  1991  . CHOLECYSTECTOMY  2000  . COLON SURGERY    . COLON SURGERY    . COLONOSCOPY    . ESOPHAGOGASTRODUODENOSCOPY  multiple    Medication Sig  . amiodarone (PACERONE) 200 MG tablet PO amiodarone to 400 mg po BID x 3 days, followed by 200 mg po BID x 3 days, followed by 200 mg po daily.  [place] Kitchen amLODipine (NORVASC) 2.5 MG tablet Take 1 tablet (2.5 mg total) daily by mouth.  . Cholecalciferol (VITAMIN D-3) 1000 UNITS CAPS Take 1,000 Units by mouth 2 (two) times daily.   . furosemide (LASIX) 40 MG tablet Take 1 tablet (40 mg  total) 2 (two) times daily by mouth.  . levothyroxine (SYNTHROID, LEVOTHROID) 112 MCG tablet TAKE 1 TABLET BY MOUTH ONCE DAILY IN THE MORNING  . MAGNESIUM PO Take 1 tablet by mouth daily.  . metoprolol succinate (TOPROL-XL) 50 MG 24 hr tablet Take 3 tablets (150 mg total) daily by mouth.  . Omega-3 Fatty Acids (FISH OIL PO) Take 1 tablet by mouth daily.  . potassium chloride SA (K-DUR,KLOR-CON) 20 MEQ tablet Take 2 tablets (40 mEq total) daily by mouth.  . ranitidine (ZANTAC) 300 MG tablet Take 1 tablet (300 mg total) at bedtime by mouth.   No current facility-administered medications for this visit.     Allergies:   Ciprofloxacin; Robaxin [methocarbamol]; Sulfonamide derivatives; and Verapamil    Social History:  The patient  reports that  has never smoked. she has never used smokeless tobacco. She reports that she does not drink alcohol or use drugs.   Family History:  The patient's family history includes Heart attack in her father; Heart failure in her mother; Hypertension in her mother and sister.    ROS:  Please see the history of present illness. All other systems are reviewed and negative.    PHYSICAL EXAM: VS:  BP 128/70   Pulse (!) 103   Ht 5' 4" (1.626 m)   Wt 233 lb (105.7 kg)   BMI 39.99 kg/m  , BMI Body mass index is 39.99 kg/m. GEN: Well nourished, well developed, female in no acute distress  HEENT: normal for age  Neck: Minimal JVD, no carotid bruit, no masses Cardiac: Irregular rate and rhythm; no murmur, no rubs, or gallops Respiratory: Generally clear but Rales left base noted, normal work of breathing GI: soft, nontender, nondistended, + BS MS: no deformity or atrophy; trace lower extremity edema; distal pulses are 2+ in all 4 extremities   Skin: warm and dry, no rash Neuro:  Strength and sensation are intact Psych: euthymic mood, full affect   EKG:  EKG is ordered today. The ekg ordered today demonstrates atrial fibrillation, heart rate 103, possible  LVH  ECHO: 04/14/2017 - Left ventricle: The cavity size was normal. Wall thickness was increased in a pattern of mild LVH. Systolic function was moderately reduced. The estimated ejection fraction was in the range of 35% to 40%. Diffuse hypokinesis. Indeterminant diastolic function (atrial fibrillation). - Aortic valve: There was no stenosis. - Mitral valve: Mildly calcified annulus. There was mildregurgitation. - Left atrium: The atrium was moderately dilated. - Right ventricle: The cavity size was normal. Systolic function was normal. - Right atrium: The atrium was mildly dilated. - Tricuspid valve: Peak RV-RA gradient (S): 24 mm Hg. - Systemic veins: IVC was not visualized. Impressions: - The patient was in atrial fibrillation. Normal LV  size with mild LV hypertrophy. EF 35-40%, diffuse hypokinesis. Normal RV size and systolic function. Mild mitral regurgitation.   Recent Labs: 04/14/2017: B Natriuretic Peptide 603.4 04/15/2017: Magnesium 1.7 04/17/2017: ALT 41 04/22/2017: BUN 47; Creat 1.19; Hemoglobin 14.9; Platelets 285; Potassium 4.5; Sodium 141; TSH 11.13    Lipid Panel    Component Value Date/Time   CHOL 182 02/16/2017 1109   TRIG 69 02/16/2017 1109   HDL 81 02/16/2017 1109   CHOLHDL 2.2 02/16/2017 1109   VLDL 14 12/26/2015 0950   LDLCALC 80 12/26/2015 0950     Wt Readings from Last 3 Encounters:  04/24/17 233 lb (105.7 kg)  04/22/17 230 lb 12.8 oz (104.7 kg)  04/17/17 232 lb 9.4 oz (105.5 kg)     Other studies Reviewed: Additional studies/ records that were reviewed today include: Hospital records and testing.  ASSESSMENT AND PLAN:  1.  Chronic systolic CHF: I talked to the patient and made her realize that she had lost almost 20 pounds since being admitted to the hospital.  She has to stick to a low-sodium diet.  She also has to watch her fluid intake.  She must start doing daily weights.  She was advised that Gatorade will put fluid on.   Her BUN and creatinine were elevated above baseline at 47/1.19.  If she will continue to watch her sodium and fluid closely, will decrease her Lasix to 40 mg daily and her potassium to 1 tablet daily.  She is to take an extra potassium and Lasix tablet daily as needed for weight gains of 3 pounds in a day or 5 pounds in a week.  Follow-up in a month, sooner if needed.  Check be met PCP office in 2-4 weeks as scheduled  2.  Atrial fibrillation: Rate is minimally elevated today, but improved when she sat and rested.  She is aware of the atrial fibrillation and feels the palpitations frequently.  She is compliant with the amiodarone and has decreased the dose to the point that she is on 200 mg daily.  She is compliant with the Toprol-XL, taking 150 mg daily, but wishes she were taking fewer pills. She was seen by Dr. Rayann Heman in the hospital for the VT and for the atrial fibrillation.  I will send her to the A. fib clinic for further evaluation and management.  3.  Ventricular tachycardia: She has palpitations at times and will feel her heart beat fast.  She has not been symptomatic with any of these and the rapid heartbeats have resolved quickly.  I will leave it to the A. fib clinic to decide if she needs a monitor or not.  4.  Anticoagulation:  Her CHADS2VASC=4 (female, age x 1, CHF, HTN). She has a history of esophageal varices, portal hypertensive gastropathy and cirrhosis. She has not been considered an anticoagulation candidate because of these issues.  However, she has no recent bleeding issues and to get her back in sinus rhythm, the benefits of anticoagulation may outweigh the risks, short-term.  We will leave to the Electrophysiology team.  5.  Cardiomyopathy: Her EF is decreased from previous.  She is not having any ischemic symptoms.  She is not an ideal candidate for dual antiplatelet therapy and would not be a bypass candidate because of her comorbidities.  Current medicines are reviewed at  length with the patient today.  The patient has concerns regarding medicines.  The following changes have been made: Change formulation of Toprol, change p.m. dose of Lasix  and potassium to as needed  Labs/ tests ordered today include:   Orders Placed This Encounter  Procedures  . EKG 12-Lead     Disposition:   FU with Dr. Percival Spanish  Signed, Rosaria Ferries, PA-C  04/24/2017 12:49 PM    [place] Phone: [phone]; Fax: [phone]  This note was written with the assistance of speech recognition software. Please excuse any transcriptional errors.

## 2017-04-24 NOTE — Patient Instructions (Addendum)
Suanne Marker, PA has recommended the following:  -- 2000mg  sodium limit daily -- 2L fluid restriction with at least half water -- decrease lasix to 40mg  daily -- decrease potassium to 1 tablet daily -- take additional 40mg  lasix + 1 tablet potassium for weight gain of 3lbs in 24 hours or 5lbs in 1 weeks -- monitor daily weights - weigh in AM after voiding, before eating, in same amount of clothes -- please have lab work by PCP in 1-2 weeks - if not, call our office Suanne Marker, [place] recommends a magnesium level & BMET)  ** Your metoprolol succinate has been changes to 100mg  tablets - take 1.5 tablets daily = 150mg     Suanne Marker, PA recommends that you schedule a follow-up appointment in: ONE MONTH with Suanne Marker, [place] or Dr. Percival Spanish

## 2017-05-03 DIAGNOSIS — J9621 Acute and chronic respiratory failure with hypoxia: Secondary | ICD-10-CM | POA: Diagnosis not present

## 2017-05-03 DIAGNOSIS — J45909 Unspecified asthma, uncomplicated: Secondary | ICD-10-CM | POA: Diagnosis not present

## 2017-05-03 DIAGNOSIS — D72819 Decreased white blood cell count, unspecified: Secondary | ICD-10-CM | POA: Diagnosis not present

## 2017-05-03 DIAGNOSIS — I8510 Secondary esophageal varices without bleeding: Secondary | ICD-10-CM | POA: Diagnosis not present

## 2017-05-03 DIAGNOSIS — E039 Hypothyroidism, unspecified: Secondary | ICD-10-CM | POA: Diagnosis not present

## 2017-05-03 DIAGNOSIS — I480 Paroxysmal atrial fibrillation: Secondary | ICD-10-CM | POA: Diagnosis not present

## 2017-05-03 DIAGNOSIS — E785 Hyperlipidemia, unspecified: Secondary | ICD-10-CM | POA: Diagnosis not present

## 2017-05-03 DIAGNOSIS — K7581 Nonalcoholic steatohepatitis (NASH): Secondary | ICD-10-CM | POA: Diagnosis not present

## 2017-05-03 DIAGNOSIS — G4733 Obstructive sleep apnea (adult) (pediatric): Secondary | ICD-10-CM | POA: Diagnosis not present

## 2017-05-03 DIAGNOSIS — Z8701 Personal history of pneumonia (recurrent): Secondary | ICD-10-CM | POA: Diagnosis not present

## 2017-05-03 DIAGNOSIS — M5135 Other intervertebral disc degeneration, thoracolumbar region: Secondary | ICD-10-CM | POA: Diagnosis not present

## 2017-05-03 DIAGNOSIS — K766 Portal hypertension: Secondary | ICD-10-CM | POA: Diagnosis not present

## 2017-05-03 DIAGNOSIS — I420 Dilated cardiomyopathy: Secondary | ICD-10-CM | POA: Diagnosis not present

## 2017-05-03 DIAGNOSIS — D649 Anemia, unspecified: Secondary | ICD-10-CM | POA: Diagnosis not present

## 2017-05-03 DIAGNOSIS — R7303 Prediabetes: Secondary | ICD-10-CM | POA: Diagnosis not present

## 2017-05-03 DIAGNOSIS — I5043 Acute on chronic combined systolic (congestive) and diastolic (congestive) heart failure: Secondary | ICD-10-CM | POA: Diagnosis not present

## 2017-05-03 DIAGNOSIS — Z87440 Personal history of urinary (tract) infections: Secondary | ICD-10-CM | POA: Diagnosis not present

## 2017-05-03 DIAGNOSIS — Z9981 Dependence on supplemental oxygen: Secondary | ICD-10-CM | POA: Diagnosis not present

## 2017-05-03 DIAGNOSIS — I110 Hypertensive heart disease with heart failure: Secondary | ICD-10-CM | POA: Diagnosis not present

## 2017-05-06 ENCOUNTER — Other Ambulatory Visit: Payer: Self-pay | Admitting: *Deleted

## 2017-05-06 MED ORDER — AMLODIPINE BESYLATE 2.5 MG PO TABS: 2.5000 mg | ORAL_TABLET | Freq: Every day | ORAL | 2 refills | Status: DC

## 2017-05-08 DIAGNOSIS — I10 Essential (primary) hypertension: Secondary | ICD-10-CM | POA: Diagnosis not present

## 2017-05-13 ENCOUNTER — Encounter: Payer: Self-pay | Admitting: Internal Medicine

## 2017-05-13 DIAGNOSIS — Z87440 Personal history of urinary (tract) infections: Secondary | ICD-10-CM | POA: Diagnosis not present

## 2017-05-13 DIAGNOSIS — R7303 Prediabetes: Secondary | ICD-10-CM | POA: Diagnosis not present

## 2017-05-13 DIAGNOSIS — E039 Hypothyroidism, unspecified: Secondary | ICD-10-CM | POA: Diagnosis not present

## 2017-05-13 DIAGNOSIS — I5043 Acute on chronic combined systolic (congestive) and diastolic (congestive) heart failure: Secondary | ICD-10-CM | POA: Diagnosis not present

## 2017-05-13 DIAGNOSIS — Z8701 Personal history of pneumonia (recurrent): Secondary | ICD-10-CM | POA: Diagnosis not present

## 2017-05-13 DIAGNOSIS — I420 Dilated cardiomyopathy: Secondary | ICD-10-CM | POA: Diagnosis not present

## 2017-05-13 DIAGNOSIS — E785 Hyperlipidemia, unspecified: Secondary | ICD-10-CM | POA: Diagnosis not present

## 2017-05-13 DIAGNOSIS — K766 Portal hypertension: Secondary | ICD-10-CM | POA: Diagnosis not present

## 2017-05-13 DIAGNOSIS — I480 Paroxysmal atrial fibrillation: Secondary | ICD-10-CM | POA: Diagnosis not present

## 2017-05-13 DIAGNOSIS — G4733 Obstructive sleep apnea (adult) (pediatric): Secondary | ICD-10-CM | POA: Diagnosis not present

## 2017-05-13 DIAGNOSIS — Z9981 Dependence on supplemental oxygen: Secondary | ICD-10-CM | POA: Diagnosis not present

## 2017-05-13 DIAGNOSIS — M5135 Other intervertebral disc degeneration, thoracolumbar region: Secondary | ICD-10-CM | POA: Diagnosis not present

## 2017-05-13 DIAGNOSIS — I8510 Secondary esophageal varices without bleeding: Secondary | ICD-10-CM | POA: Diagnosis not present

## 2017-05-13 DIAGNOSIS — D649 Anemia, unspecified: Secondary | ICD-10-CM | POA: Diagnosis not present

## 2017-05-13 DIAGNOSIS — I110 Hypertensive heart disease with heart failure: Secondary | ICD-10-CM | POA: Diagnosis not present

## 2017-05-13 DIAGNOSIS — K7581 Nonalcoholic steatohepatitis (NASH): Secondary | ICD-10-CM | POA: Diagnosis not present

## 2017-05-13 DIAGNOSIS — D72819 Decreased white blood cell count, unspecified: Secondary | ICD-10-CM | POA: Diagnosis not present

## 2017-05-13 DIAGNOSIS — J9621 Acute and chronic respiratory failure with hypoxia: Secondary | ICD-10-CM | POA: Diagnosis not present

## 2017-05-13 DIAGNOSIS — J45909 Unspecified asthma, uncomplicated: Secondary | ICD-10-CM | POA: Diagnosis not present

## 2017-05-19 ENCOUNTER — Other Ambulatory Visit: Payer: Self-pay | Admitting: *Deleted

## 2017-05-19 MED ORDER — FUROSEMIDE 40 MG PO TABS: ORAL_TABLET | ORAL | 1 refills | Status: DC

## 2017-05-20 ENCOUNTER — Encounter: Payer: Self-pay | Admitting: Internal Medicine

## 2017-05-20 ENCOUNTER — Ambulatory Visit: Payer: PPO | Admitting: Internal Medicine

## 2017-05-20 VITALS — BP 118/82 | HR 104 | Temp 97.6°F | Resp 18 | Ht 64.0 in | Wt 226.4 lb

## 2017-05-20 DIAGNOSIS — I480 Paroxysmal atrial fibrillation: Secondary | ICD-10-CM | POA: Diagnosis not present

## 2017-05-20 DIAGNOSIS — R7303 Prediabetes: Secondary | ICD-10-CM | POA: Diagnosis not present

## 2017-05-20 DIAGNOSIS — E559 Vitamin D deficiency, unspecified: Secondary | ICD-10-CM | POA: Diagnosis not present

## 2017-05-20 DIAGNOSIS — E039 Hypothyroidism, unspecified: Secondary | ICD-10-CM | POA: Diagnosis not present

## 2017-05-20 DIAGNOSIS — Z79899 Other long term (current) drug therapy: Secondary | ICD-10-CM

## 2017-05-20 DIAGNOSIS — I10 Essential (primary) hypertension: Secondary | ICD-10-CM

## 2017-05-20 DIAGNOSIS — R7309 Other abnormal glucose: Secondary | ICD-10-CM | POA: Diagnosis not present

## 2017-05-20 DIAGNOSIS — K7469 Other cirrhosis of liver: Secondary | ICD-10-CM

## 2017-05-20 DIAGNOSIS — E782 Mixed hyperlipidemia: Secondary | ICD-10-CM | POA: Diagnosis not present

## 2017-05-20 NOTE — Patient Instructions (Signed)

## 2017-05-20 NOTE — Progress Notes (Signed)
This very nice 76 y.o. single WF presents for 3 month follow up with Hypertension, ASHD, pAfib, hx/o V Tach, COPD & OSA/hs O2,  Hyperlipidemia, Hypothyroidism,  Pre-Diabetes and Vitamin D Deficiency. Also has hx/o NAFLD/Cirrhosis w/ portal HTN/Eso Varices.     Patient is treated for HTN & BP has been controlled at home. Today's BP is at goal - 118/82.  She also has CKD3 (GFR 45 ml/min).  She was recently hospitalized  In early November with rapid Afib & was also felt to have paroxysms of V Tach and was started on Amiodarone & Metoprolol. Patient denies any cardiac type chest pain or dyspnea, but does report occasional transient irregular palpitations. She also reports recent postural lightheadedness. Postural /orthoistatic BP's today are (+).     Hyperlipidemia is controlled with diet & meds. Patient denies myalgias or other med SE's. Last Lipids were  Lab Results  Component Value Date   CHOL 182 02/16/2017   HDL 81 02/16/2017   LDLCALC 80 12/26/2015   TRIG 69 02/16/2017   CHOLHDL 2.2 02/16/2017      Also, the patient has history of  PreDiabetes and has had no symptoms of reactive hypoglycemia, diabetic polys, paresthesias or visual blurring.  Last A1c was at goal:  Lab Results  Component Value Date   HGBA1C 5.3 04/12/2017      Patient also is on thyroid replacement. Further, the patient also has history of Vitamin D Deficiency and does not supplement vitamin D as recommended. Last vitamin D was still very low: Lab Results  Component Value Date   VD25OH 23 (L) 02/16/2017   Current Outpatient Medications on File Prior to Visit  Medication Sig  . amiodarone (PACERONE) 200 MG tablet PO amiodarone to 400 mg po BID x 3 days, followed by 200 mg po BID x 3 days, followed by 200 mg po daily.  Marland Kitchen amLODipine (NORVASC) 2.5 MG tablet Take 1 tablet (2.5 mg total) by mouth daily.  . Cholecalciferol (VITAMIN D-3) 1000 UNITS CAPS Take 1,000 Units by mouth 2 (two) times daily.   . furosemide (LASIX)  40 MG tablet Take 1 tablet by mouth twice daily.  Marland Kitchen levothyroxine (SYNTHROID, LEVOTHROID) 112 MCG tablet TAKE 1 TABLET BY MOUTH ONCE DAILY IN THE MORNING  . MAGNESIUM PO Take 1 tablet by mouth daily.  . metoprolol succinate (TOPROL-XL) 100 MG 24 hr tablet Take 1.5 tablets (150mg ) by mouth daily  . Omega-3 Fatty Acids (FISH OIL PO) Take 1 tablet by mouth daily.  . potassium chloride SA (K-DUR,KLOR-CON) 20 MEQ tablet Take 20 mEq daily by mouth. Take additional tablet daily if take extra lasix  . ranitidine (ZANTAC) 300 MG tablet Take 1 tablet (300 mg total) at bedtime by mouth.   No current facility-administered medications on file prior to visit.    Allergies  Allergen Reactions  . Ciprofloxacin     Swelling  . Robaxin [Methocarbamol]     palpiations  . Sulfonamide Derivatives     See little dots, skin feels like its burning  . Verapamil     palpitations   PMHx:   Past Medical History:  Diagnosis Date  . Anemia   . Blood transfusion without reported diagnosis   . Cirrhosis ([place])   . Degenerative disk disease   . Diverticulosis   . Hemorrhoids   . Hypertension   . Kidney stones   . Obesity   . Paroxysmal atrial fibrillation (HCC)   . Partial small bowel obstruction ([place])   .  Personal history of colonic polyps 03/25/2012   8 mm rectal adenoma 03/2012  . Portal hypertensive gastropathy ([place])   . Shingles   . Thyroid disease   . Varices, esophageal ([place])   . Zoster    Immunization History  Administered Date(s) Administered  . DT 12/21/2014  . Influenza Whole 03/09/2012  . Influenza,inj,Quad PF,6+ Mos 02/09/2014  . Pneumococcal Conjugate-13 12/19/2013  . Pneumococcal Polysaccharide-23 06/09/2004, 12/26/2015  . Td 06/09/2004  . Zoster 12/15/2012   Past Surgical History:  Procedure Laterality Date  . ABDOMINAL HYSTERECTOMY    . APPENDECTOMY  1991  . CHOLECYSTECTOMY  2000  . COLON SURGERY    . COLON SURGERY    . COLONOSCOPY    . ESOPHAGOGASTRODUODENOSCOPY  multiple    FHx:    Reviewed / unchanged  SHx:    Reviewed / unchanged  Systems Review:  Constitutional: Denies fever, chills, wt changes, headaches, insomnia, fatigue, night sweats, change in appetite. Eyes: Denies redness, blurred vision, diplopia, discharge, itchy, watery eyes.  ENT: Denies discharge, congestion, post nasal drip, epistaxis, sore throat, earache, hearing loss, dental pain, tinnitus, vertigo, sinus pain, snoring.  CV: Denies chest pain, palpitations, irregular heartbeat, syncope, dyspnea, diaphoresis, orthopnea, PND, claudication or edema. Respiratory: denies cough, dyspnea, DOE, pleurisy, hoarseness, laryngitis, wheezing.  Gastrointestinal: Denies dysphagia, odynophagia, heartburn, reflux, water brash, abdominal pain or cramps, nausea, vomiting, bloating, diarrhea, constipation, hematemesis, melena, hematochezia  or hemorrhoids. Genitourinary: Denies dysuria, frequency, urgency, nocturia, hesitancy, discharge, hematuria or flank pain. Musculoskeletal: Denies arthralgias, myalgias, stiffness, jt. swelling, pain, limping or strain/sprain.  Skin: Denies pruritus, rash, hives, warts, acne, eczema or change in skin lesion(s). Neuro: No weakness, tremor, incoordination, spasms, paresthesia or pain. Psychiatric: Denies confusion, memory loss or sensory loss. Endo: Denies change in weight, skin or hair change.  Heme/Lymph: No excessive bleeding, bruising or enlarged lymph nodes.  Physical Exam  BP 118/82   Pulse (!) 104   Temp 97.6 F (36.4 C)   Resp 18   Ht 5\' 4"  (1.626 m)   Wt 226 lb 6.4 oz (102.7 kg)   BMI 38.86 kg/m    Orthostatic BP's with sitting BP 105/72  P 74 and Standing BP 96/54  P 93   Appears over nourished, well groomed  and in no distress.  Eyes: PERRLA, EOMs, conjunctiva no swelling or erythema. Sinuses: No frontal/maxillary tenderness ENT/Mouth: EAC's clear, TM's nl w/o erythema, bulging. Nares clear w/o erythema, swelling, exudates. Oropharynx clear without  erythema or exudates. Oral hygiene is good. Tongue normal, non obstructing. Hearing intact.  Neck: Supple. Thyroid nl. Car 2+/2+ without bruits, nodes or JVD. Chest: Respirations nl with BS clear & equal w/o rales, rhonchi, wheezing or stridor.  Cor: Heart sounds normal w/ sl irregular rate and rhythm without sig. murmurs. Pedal pulses 1+ and equal  With 1-2+ pretibial edema. Abdomen: Soft & bowel sounds normal. Non-tender w/o guarding, rebound, hernias, masses or organomegaly.  Lymphatics: Unremarkable.  Musculoskeletal: Full ROM all peripheral extremities, joint stability, 5/5 strength and normal gait.  Skin: Warm, dry without exposed rashes, lesions or ecchymosis apparent.  Neuro: Cranial nerves intact, reflexes equal bilaterally. Sensory-motor testing grossly intact. Tendon reflexes grossly intact.  Pysch: Alert & oriented x 3.  Insight and judgement nl & appropriate. No ideations.  Assessment and Plan:  1. Essential hypertension  - Advised to hold Lasix/ KCL and monitor daily weights and also BIB standing BP's ?pulses and call report in  1 week   - Continue DASH diet. Reminder to go  to the ER if any CP,  SOB, nausea, dizziness, severe HA, changes vision/speech.  - CBC with Differential/Platelet - BASIC METABOLIC PANEL WITH GFR - Magnesium - TSH  2. Hyperlipidemia, mixed  - Continue diet/meds, exercise,& lifestyle modifications.  - Continue monitor periodic cholesterol/liver & renal functions  - Hepatic function panel - Lipid panel - TSH  3. Prediabetes  - Continue diet, exercise, lifestyle modifications.  - Monitor appropriate labs.  - Hemoglobin A1c - Insulin, random  4. Vitamin D deficiency  - Continue supplementation.  - VITAMIN D 25 Hydroxy   5. Abnormal glucose  - Hemoglobin A1c - Insulin, random  6. Hypothyroidism  - TSH  7. Paroxysmal atrial fibrillation (HCC)  - TSH  8. Cirrhosis of liver ([place])  9. Medication management  - CBC with  Differential/Platelet - BASIC METABOLIC PANEL WITH GFR - Hepatic function panel - Magnesium - Lipid panel - TSH - Hemoglobin A1c - Insulin, random - VITAMIN D 25 Hydroxy        Discussed  regular exercise, BP monitoring, weight control to achieve/maintain BMI less than 25 and discussed med and SE's. Recommended labs to assess and monitor clinical status with further disposition pending results of labs. Over 30 minutes of exam, counseling, chart review was performed.

## 2017-05-21 ENCOUNTER — Other Ambulatory Visit: Payer: Self-pay | Admitting: Internal Medicine

## 2017-05-21 DIAGNOSIS — E039 Hypothyroidism, unspecified: Secondary | ICD-10-CM

## 2017-05-21 LAB — CBC WITH DIFFERENTIAL/PLATELET
BASOS ABS: 69 {cells}/uL (ref 0–200)
Basophils Relative: 1.3 %
EOS PCT: 1.7 %
Eosinophils Absolute: 90 cells/uL (ref 15–500)
HCT: 41.9 % (ref 35.0–45.0)
Hemoglobin: 14.5 g/dL (ref 11.7–15.5)
LYMPHS ABS: 1108 {cells}/uL (ref 850–3900)
MCH: 32.2 pg (ref 27.0–33.0)
MCHC: 34.6 g/dL (ref 32.0–36.0)
MCV: 93.1 fL (ref 80.0–100.0)
MONOS PCT: 14.5 %
MPV: 10.5 fL (ref 7.5–12.5)
NEUTROS PCT: 61.6 %
Neutro Abs: 3265 cells/uL (ref 1500–7800)
PLATELETS: 288 10*3/uL (ref 140–400)
RBC: 4.5 10*6/uL (ref 3.80–5.10)
RDW: 16.4 % — ABNORMAL HIGH (ref 11.0–15.0)
TOTAL LYMPHOCYTE: 20.9 %
WBC: 5.3 10*3/uL (ref 3.8–10.8)
WBCMIX: 769 {cells}/uL (ref 200–950)

## 2017-05-21 LAB — HEMOGLOBIN A1C
HEMOGLOBIN A1C: 5 %{Hb} (ref <5.7)
Mean Plasma Glucose: 97 (calc)
eAG (mmol/L): 5.4 (calc)

## 2017-05-21 LAB — LIPID PANEL
CHOL/HDL RATIO: 3.8 (calc) (ref <5.0)
CHOLESTEROL: 142 mg/dL (ref <200)
HDL: 37 mg/dL — AB (ref >50)
LDL CHOLESTEROL (CALC): 89 mg/dL
Non-HDL Cholesterol (Calc): 105 mg/dL (calc) (ref <130)
Triglycerides: 73 mg/dL (ref <150)

## 2017-05-21 LAB — MAGNESIUM: MAGNESIUM: 2.2 mg/dL (ref 1.5–2.5)

## 2017-05-21 LAB — BASIC METABOLIC PANEL WITH GFR
BUN/Creatinine Ratio: 16 (calc) (ref 6–22)
BUN: 21 mg/dL (ref 7–25)
CALCIUM: 9.2 mg/dL (ref 8.6–10.4)
CO2: 28 mmol/L (ref 20–32)
CREATININE: 1.29 mg/dL — AB (ref 0.60–0.93)
Chloride: 105 mmol/L (ref 98–110)
GFR, Est African American: 47 mL/min/{1.73_m2} — ABNORMAL LOW (ref >=60)
GFR, Est Non African American: 41 mL/min/{1.73_m2} — ABNORMAL LOW (ref >=60)
GLUCOSE: 123 mg/dL — AB (ref 65–99)
Potassium: 4.8 mmol/L (ref 3.5–5.3)
Sodium: 140 mmol/L (ref 135–146)

## 2017-05-21 LAB — TSH: TSH: 19.68 mIU/L — ABNORMAL HIGH (ref 0.40–4.50)

## 2017-05-21 LAB — VITAMIN D 25 HYDROXY (VIT D DEFICIENCY, FRACTURES): VIT D 25 HYDROXY: 37 ng/mL (ref 30–100)

## 2017-05-21 LAB — HEPATIC FUNCTION PANEL
AG RATIO: 0.7 (calc) — AB (ref 1.0–2.5)
ALBUMIN MSPROF: 2.5 g/dL — AB (ref 3.6–5.1)
ALT: 36 U/L — AB (ref 6–29)
AST: 67 U/L — ABNORMAL HIGH (ref 10–35)
Alkaline phosphatase (APISO): 76 U/L (ref 33–130)
BILIRUBIN INDIRECT: 1.2 mg/dL (ref 0.2–1.2)
Bilirubin, Direct: 0.5 mg/dL — ABNORMAL HIGH (ref 0.0–0.2)
GLOBULIN: 3.8 g/dL — AB (ref 1.9–3.7)
TOTAL PROTEIN: 6.3 g/dL (ref 6.1–8.1)
Total Bilirubin: 1.7 mg/dL — ABNORMAL HIGH (ref 0.2–1.2)

## 2017-05-21 LAB — INSULIN, RANDOM: INSULIN: 26.9 u[IU]/mL — AB (ref 2.0–19.6)

## 2017-05-22 ENCOUNTER — Encounter: Payer: Self-pay | Admitting: Cardiology

## 2017-05-25 NOTE — Progress Notes (Signed)
Cardiology Office Note   Date:  05/28/2017   ID:  Samon, Dishner [birth year], MRN [id number]  PCP:  Unk Pinto, MD  Cardiologist:   Minus Breeding, MD  Referring:  Unk Pinto, MD  Chief Complaint  Patient presents with  . Atrial Fibrillation      History of Present Illness: Brittney Valentine is a 76 y.o. female who presents for evaluation of atrial fib that was diagnosed in 2000.   She has been treated with flecainide.  She has been intolerant of metoprolol and Cardizem.   However, she was in the hospital in Nov with acute on chronic respiratory failure.  She had VT during that admission and was changed to amiodarone.  She was otherwise treated conservatively as she had too many comorbid conditions. She was not thought to be a candidate for anticoagulation.    She returns for follow-up and her biggest complaint has been fatigue.  I do note that her thyroid is still off of her TSH being almost 20 but this has been adjusted.  Certainly this is somewhat related to the amiodarone which is necessary.  She does feel some rapid heart rates but it does not seem to be severe.  She is not describing presyncope or syncope but she has some mild orthostasis that she controls.  She apparently had  orthostatic blood pressure drop documented in Unk Pinto, MD office.    Past Medical History:  Diagnosis Date  . Anemia   . Blood transfusion without reported diagnosis   . Cirrhosis ([place])   . Degenerative disk disease   . Diverticulosis   . Hemorrhoids   . Hypertension   . Kidney stones   . Obesity   . Paroxysmal atrial fibrillation (HCC)   . Partial small bowel obstruction ([place])   . Personal history of colonic polyps 03/25/2012   8 mm rectal adenoma 03/2012  . Portal hypertensive gastropathy ([place])   . Shingles   . Thyroid disease   . Varices, esophageal ([place])   . Zoster     Past Surgical History:  Procedure Laterality Date  . ABDOMINAL HYSTERECTOMY    .  APPENDECTOMY  1991  . CHOLECYSTECTOMY  2000  . COLON SURGERY    . COLON SURGERY    . COLONOSCOPY    . ESOPHAGOGASTRODUODENOSCOPY  multiple     Current Outpatient Medications  Medication Sig Dispense Refill  . amiodarone (PACERONE) 200 MG tablet PO amiodarone to 400 mg po BID x 3 days, followed by 200 mg po BID x 3 days, followed by 200 mg po daily. 120 tablet 0  . amLODipine (NORVASC) 2.5 MG tablet Take 1 tablet (2.5 mg total) by mouth daily. 30 tablet 2  . Cholecalciferol (VITAMIN D-3) 1000 UNITS CAPS Take 1,000 Units by mouth 2 (two) times daily.     Marland Kitchen levothyroxine (SYNTHROID, LEVOTHROID) 112 MCG tablet TAKE 1 TABLET BY MOUTH ONCE DAILY IN THE MORNING 90 tablet 0  . metoprolol succinate (TOPROL-XL) 100 MG 24 hr tablet Take 1.5 tablets (150mg ) by mouth daily 135 tablet 1  . Omega-3 Fatty Acids (FISH OIL PO) Take 1 tablet by mouth daily.    . ranitidine (ZANTAC) 300 MG tablet Take 1 tablet (300 mg total) at bedtime by mouth. 30 tablet 1   No current facility-administered medications for this visit.     Allergies:   Ciprofloxacin; Robaxin [methocarbamol]; Sulfonamide derivatives; and Verapamil    ROS:  Please see the history of present illness.  Otherwise, review of systems are positive for none.   All other systems are reviewed and negative.    PHYSICAL EXAM: VS:  BP 113/63   Pulse 98   Ht 5\' 5"  (1.651 m)   Wt 231 lb (104.8 kg)   BMI 38.44 kg/m  , BMI Body mass index is 38.44 kg/m.  GENERAL:  Somewhat frail appearing NECK:  No jugular venous distention, waveform within normal limits, carotid upstroke brisk and symmetric, no bruits, no thyromegaly LUNGS:  Clear to auscultation bilaterally CHEST:  Unremarkable HEART:  PMI not displaced or sustained,S1 and S2 within normal limits, no S3,  no clicks, no rubs, no murmurs, irregular ABD:  Flat, positive bowel sounds normal in frequency in pitch, no bruits, no rebound, no guarding, no midline pulsatile mass, no hepatomegaly, no  splenomegaly EXT:  2 plus pulses throughout, no edema, no cyanosis no clubbing   EKG:  EKG is not  ordered today.    Recent Labs: 04/14/2017: B Natriuretic Peptide 603.4 05/20/2017: ALT 36; BUN 21; Creat 1.29; Hemoglobin 14.5; Magnesium 2.2; Platelets 288; Potassium 4.8; Sodium 140; TSH 19.68    Lipid Panel    Component Value Date/Time   CHOL 142 05/20/2017 1531   TRIG 73 05/20/2017 1531   HDL 37 (L) 05/20/2017 1531   CHOLHDL 3.8 05/20/2017 1531   VLDL 14 12/26/2015 0950   LDLCALC 80 12/26/2015 0950      Wt Readings from Last 3 Encounters:  05/28/17 231 lb (104.8 kg)  05/20/17 226 lb 6.4 oz (102.7 kg)  04/24/17 233 lb (105.7 kg)      Other studies Reviewed: Additional studies/ records that were reviewed today include: Hospital records. Review of the above records demonstrates:      ASSESSMENT AND PLAN:  PAROXYSMAL ATRIAL FIB:  Brittney Valentine has a CHA2DS2 - VASc score of 4.   She is not an anticoagulation candidate.  She does have some tachycardia and was in A. fib in November and appears to be in A. fib by exam today.  The rate is relatively well controlled.  I do not think she will tolerate further rate control medications and she does not seem to be a candidate for other medical therapy.  I will refer her back to Dr. Rayann Heman to discuss the atrial fib and the ventricular tachycardia.  She is likely not a candidate for any further EP invasive procedure.   CARDIOMYOPATHY:  Her EF during the hospitalization was 35 - 40%.  For now I do not think she will tolerate med titration and I would like to repeat an echo when we see her back in 2 months.  She is fairly frail and not having any anginal symptoms.  I am not anticipating an ischemia evaluation but we could consider this.  HTN:   Blood pressure is the rate limiting step and titrating medications.  She will continue on the meds as listed.  VT:  She continues on amiodarone.  She has had no symptomatic recurrence of this.   Follow-up as above.  WEAKNESS: I suspect this is multifactorial.  He does have hypothyroidism.  This is being managed in the context of needing to be on amiodarone.  This is being adjusted by Unk Pinto, MD hopefully she will start to feel better as this gets regulated.  In addition she has orthostasis and we discussed conservative management of this.   Current medicines are reviewed at length with the patient today.  The patient does not have concerns  regarding medicines.  The following changes have been made:  None  Labs/ tests ordered today include:  No orders of the defined types were placed in this encounter.    Disposition:   FU with APP in 3 months  Signed, Minus Breeding, MD  05/28/2017 10:20 AM    [place]

## 2017-05-28 ENCOUNTER — Ambulatory Visit: Payer: PPO | Admitting: Cardiology

## 2017-05-28 ENCOUNTER — Encounter: Payer: Self-pay | Admitting: Cardiology

## 2017-05-28 VITALS — BP 113/63 | HR 98 | Ht 65.0 in | Wt 231.0 lb

## 2017-05-28 DIAGNOSIS — I429 Cardiomyopathy, unspecified: Secondary | ICD-10-CM | POA: Diagnosis not present

## 2017-05-28 DIAGNOSIS — I4819 Other persistent atrial fibrillation: Secondary | ICD-10-CM

## 2017-05-28 DIAGNOSIS — R5383 Other fatigue: Secondary | ICD-10-CM | POA: Diagnosis not present

## 2017-05-28 DIAGNOSIS — I472 Ventricular tachycardia, unspecified: Secondary | ICD-10-CM

## 2017-05-28 DIAGNOSIS — I481 Persistent atrial fibrillation: Secondary | ICD-10-CM

## 2017-05-28 NOTE — Patient Instructions (Signed)
Medication Instructions:  Continue current medications  If you need a refill on your cardiac medications before your next appointment, please call your pharmacy.  Labwork: None Ordered    Testing/Procedures: None Ordered  Special Instructions:  Happy Holidays!!!  Follow-Up: Your physician wants you to follow-up in: 3 Months with Rosaria Ferries.    Thank you for choosing [facility]!!

## 2017-06-07 DIAGNOSIS — I10 Essential (primary) hypertension: Secondary | ICD-10-CM | POA: Diagnosis not present

## 2017-06-10 ENCOUNTER — Ambulatory Visit (INDEPENDENT_AMBULATORY_CARE_PROVIDER_SITE_OTHER): Payer: PPO | Admitting: Adult Health

## 2017-06-10 ENCOUNTER — Encounter: Payer: Self-pay | Admitting: Adult Health

## 2017-06-10 VITALS — BP 110/72 | HR 78 | Temp 97.5°F | Ht 65.0 in | Wt 234.0 lb

## 2017-06-10 DIAGNOSIS — J181 Lobar pneumonia, unspecified organism: Secondary | ICD-10-CM | POA: Diagnosis not present

## 2017-06-10 DIAGNOSIS — J189 Pneumonia, unspecified organism: Secondary | ICD-10-CM

## 2017-06-10 MED ORDER — DOXYCYCLINE HYCLATE 100 MG PO TABS
100.0000 mg | ORAL_TABLET | Freq: Two times a day (BID) | ORAL | 0 refills | Status: AC
Start: 2017-06-10 — End: 2017-06-17

## 2017-06-10 MED ORDER — PREDNISONE 20 MG PO TABS: ORAL_TABLET | ORAL | 0 refills | Status: DC

## 2017-06-10 NOTE — Progress Notes (Signed)
Assessment and Plan:  Shevelle was seen today for uri.  Diagnoses and all orders for this visit:  Pneumonia of right lower lobe due to infectious organism ([place]) -     predniSONE (DELTASONE) 20 MG tablet; 2 tablets daily for 3 days, 1 tablet daily for 4 days. -     doxycycline (VIBRA-TABS) 100 MG tablet; Take 1 tablet (100 mg total) by mouth 2 (two) times daily for 7 days.       Call if symptoms not improving by Friday  Go to the ER if any chest pain, shortness of breath, nausea, dizziness, severe HA, changes vision/speech  Further disposition pending results of labs. Discussed med's effects and SE's.   Over 15 minutes of exam, counseling, chart review, and critical decision making was performed.   Future Appointments  Date Time Provider [place]  06/29/2017 10:30 AM Thompson Grayer, MD [facility]  09/02/2017  3:30 PM Liane Comber, NP [facility] None  02/17/2018 10:00 AM Vicie Mutters, PA-C [facility] None    ------------------------------------------------------------------------------------------------------------------   HPI BP 110/72   Pulse 78   Temp (!) 97.5 F (36.4 C)   Ht 5\' 5"  (1.651 m)   Wt 234 lb (106.1 kg)   SpO2 98%   BMI 38.94 kg/m   77 y.o.female presents for 3 days of wheezing, mild non-productive cough, fatigue, mildy achy headaches as well. Denies fever/chills, denies N/V/D/abdominal pain, rash, CP/dyspnea - at rest or on exertion, weakness, extremity pain.   She is recently (75months ago) recovering from hospitalization for pyelonephritis, sepsis and pneumonia/pulmonary interstitial edema/acute respiratory failure treated by broad spectrum antibiotics then ancef and discharged with keflex.   She has a history of Combined Systolic and Diastolic, denies dyspnea on exertion, orthopnea, paroxysmal nocturnal dyspnea and edema. Positive for fatigue. She reports home weights are steady at 231-233 #.  Wt Readings from Last 3 Encounters:   06/10/17 234 lb (106.1 kg)  05/28/17 231 lb (104.8 kg)  05/20/17 226 lb 6.4 oz (102.7 kg)    Past Medical History:  Diagnosis Date  . Anemia   . Blood transfusion without reported diagnosis   . Cirrhosis ([place])   . Degenerative disk disease   . Diverticulosis   . Hemorrhoids   . Hypertension   . Kidney stones   . Obesity   . Paroxysmal atrial fibrillation (HCC)   . Partial small bowel obstruction ([place])   . Personal history of colonic polyps 03/25/2012   8 mm rectal adenoma 03/2012  . Portal hypertensive gastropathy ([place])   . Shingles   . Thyroid disease   . Varices, esophageal ([place])   . Zoster      Allergies  Allergen Reactions  . Ciprofloxacin     Swelling  . Robaxin [Methocarbamol]     palpiations  . Sulfonamide Derivatives     See little dots, skin feels like its burning  . Verapamil     palpitations    Current Outpatient Medications on File Prior to Visit  Medication Sig  . amiodarone (PACERONE) 200 MG tablet PO amiodarone to 400 mg po BID x 3 days, followed by 200 mg po BID x 3 days, followed by 200 mg po daily.  Marland Kitchen amLODipine (NORVASC) 2.5 MG tablet Take 1 tablet (2.5 mg total) by mouth daily.  . Cholecalciferol (VITAMIN D-3) 1000 UNITS CAPS Take 1,000 Units by mouth 2 (two) times daily.   Marland Kitchen levothyroxine (SYNTHROID, LEVOTHROID) 112 MCG tablet TAKE 1 TABLET BY MOUTH ONCE DAILY IN THE MORNING  .  metoprolol succinate (TOPROL-XL) 100 MG 24 hr tablet Take 1.5 tablets (150mg ) by mouth daily  . Omega-3 Fatty Acids (FISH OIL PO) Take 1 tablet by mouth daily.  . ranitidine (ZANTAC) 300 MG tablet Take 1 tablet (300 mg total) at bedtime by mouth.   No current facility-administered medications on file prior to visit.     ROS: all negative except above.   Physical Exam:  BP 110/72   Pulse 78   Temp (!) 97.5 F (36.4 C)   Ht 5\' 5"  (1.651 m)   Wt 234 lb (106.1 kg)   SpO2 98%   BMI 38.94 kg/m   General Appearance: Well nourished, in no apparent  distress. Eyes: PERRLA, EOMs, conjunctiva no swelling or erythema Sinuses: No Frontal/maxillary tenderness ENT/Mouth: Ext aud canals clear, TMs without erythema, bulging. No erythema, swelling, or exudate on post pharynx.  Tonsils not swollen or erythematous. Hearing normal.  Neck: Supple, thyroid normal.  Respiratory: Respiratory effort normal, mild expiratory wheezing over upper lobes, no rales/rhonchi/stridor, RLL lung sounds diminished. + dullness with percussion Cardio: RRR with no MRGs. Symmetrical peripheral pulses without edema.  Abdomen: Soft, + BS.  Non tender, no guarding, rebound, hernias, masses. Lymphatics: Non tender without lymphadenopathy.  Musculoskeletal: Full ROM, 5/5 strength, normal gait.  Skin: Warm, dry without rashes, lesions, ecchymosis.  Neuro: Cranial nerves intact. Normal muscle tone, no cerebellar symptoms. Sensation intact.  Psych: Awake and oriented X 3, normal affect, Insight and Judgment appropriate.     Izora Ribas, NP 3:20 PM [facility]

## 2017-06-24 ENCOUNTER — Other Ambulatory Visit: Payer: Self-pay | Admitting: Physician Assistant

## 2017-06-24 DIAGNOSIS — E039 Hypothyroidism, unspecified: Secondary | ICD-10-CM

## 2017-06-29 ENCOUNTER — Institutional Professional Consult (permissible substitution): Payer: PPO | Admitting: Internal Medicine

## 2017-07-06 ENCOUNTER — Encounter: Payer: Self-pay | Admitting: Internal Medicine

## 2017-07-06 ENCOUNTER — Ambulatory Visit: Payer: PPO | Admitting: Internal Medicine

## 2017-07-06 VITALS — BP 120/82 | HR 105 | Ht 65.0 in | Wt 228.0 lb

## 2017-07-06 DIAGNOSIS — I429 Cardiomyopathy, unspecified: Secondary | ICD-10-CM | POA: Diagnosis not present

## 2017-07-06 DIAGNOSIS — I472 Ventricular tachycardia, unspecified: Secondary | ICD-10-CM

## 2017-07-06 DIAGNOSIS — I481 Persistent atrial fibrillation: Secondary | ICD-10-CM

## 2017-07-06 DIAGNOSIS — I4819 Other persistent atrial fibrillation: Secondary | ICD-10-CM

## 2017-07-06 NOTE — Progress Notes (Signed)
Electrophysiology Office Note   Date:  07/06/2017   ID:  Brittney Valentine, Brittney Valentine 18-Jul-1940, MRN [id number]  PCP:  Unk Pinto, MD  Cardiologist:  Dr Percival Spanish Primary Electrophysiologist: Thompson Grayer, MD    Chief Complaint  Patient presents with  . Atrial Fibrillation     History of Present Illness: Brittney Valentine is a 77 y.o. female who presents today for electrophysiology evaluation.   I saw her during her hospitalization 04/14/17 (my note reviewed).  She has multiple chronic comorbidities and was quite ill at that time.  She had previously done well with flecainide for her afib but in the setting of acute medical illness it returned.  She also had VT in the hospital with acute reduction in EF.  She was placed on amiodarone. She remains in afib but is tolerating this well. + fatigue.  She also has significant thyroid dysfunction for which medications are being adjusted.   Today, she denies symptoms of palpitations, chest pain, shortness of breath, orthopnea, PND, lower extremity edema, claudication, dizziness, presyncope, syncope, bleeding, or neurologic sequela. The patient is tolerating medications without difficulties and is otherwise without complaint today.    Past Medical History:  Diagnosis Date  . Anemia   . Blood transfusion without reported diagnosis   . Cirrhosis ([place])   . Degenerative disk disease   . Diverticulosis   . Hemorrhoids   . Hypertension   . Kidney stones   . Obesity   . Paroxysmal atrial fibrillation (HCC)   . Partial small bowel obstruction ([place])   . Personal history of colonic polyps 03/25/2012   8 mm rectal adenoma 03/2012  . Portal hypertensive gastropathy ([place])   . Shingles   . Thyroid disease   . Varices, esophageal ([place])   . Zoster    Past Surgical History:  Procedure Laterality Date  . ABDOMINAL HYSTERECTOMY    . APPENDECTOMY  1991  . CHOLECYSTECTOMY  2000  . COLON SURGERY    . COLON SURGERY    . COLONOSCOPY    .  ESOPHAGOGASTRODUODENOSCOPY  multiple     Current Outpatient Medications  Medication Sig Dispense Refill  . amiodarone (PACERONE) 200 MG tablet PO amiodarone to 400 mg po BID x 3 days, followed by 200 mg po BID x 3 days, followed by 200 mg po daily. 120 tablet 0  . amLODipine (NORVASC) 2.5 MG tablet Take 1 tablet (2.5 mg total) by mouth daily. 30 tablet 2  . Cholecalciferol (VITAMIN D-3) 1000 UNITS CAPS Take 1,000 Units by mouth 2 (two) times daily.     Marland Kitchen levothyroxine (SYNTHROID, LEVOTHROID) 112 MCG tablet TAKE 1 TABLET BY MOUTH IN THE MORNING 90 tablet 0  . metoprolol succinate (TOPROL-XL) 100 MG 24 hr tablet Take 1.5 tablets (150mg ) by mouth daily 135 tablet 1  . Omega-3 Fatty Acids (FISH OIL PO) Take 1 tablet by mouth daily.    . ranitidine (ZANTAC) 300 MG tablet Take 1 tablet (300 mg total) at bedtime by mouth. 30 tablet 1  . predniSONE (DELTASONE) 20 MG tablet 2 tablets daily for 3 days, 1 tablet daily for 4 days. (Patient not taking: Reported on 07/06/2017) 10 tablet 0   No current facility-administered medications for this visit.     Allergies:   Ciprofloxacin; Robaxin [methocarbamol]; Sulfonamide derivatives; and Verapamil   Social History:  The patient  reports that  has never smoked. she has never used smokeless tobacco. She reports that she does not drink alcohol or use drugs.  Family History:  The patient's  family history includes Heart attack in her father; Heart failure in her mother; Hypertension in her mother and sister.    ROS:  Please see the history of present illness.   All other systems are personally reviewed and negative.    PHYSICAL EXAM: VS:  Ht 5\' 5"  (1.651 m)   BMI 38.94 kg/m  , BMI Body mass index is 38.94 kg/m. GEN: overweight, in no acute distress  HEENT: normal  Neck: no JVD, carotid bruits, or masses Cardiac: iRRR; no murmurs, rubs, or gallops,no edema  Respiratory:  clear to auscultation bilaterally, normal work of breathing GI: soft, nontender,  nondistended, + BS MS: no deformity or atrophy  Skin: warm and dry  Neuro:  Strength and sensation are intact Psych: euthymic mood, full affect  EKG:  EKG is ordered today. The ekg ordered today is personally reviewed and shows afib, V rate 92 bpm, Qtc 434 msec, septal infarct   Recent Labs: 04/14/2017: B Natriuretic Peptide 603.4 05/20/2017: ALT 36; BUN 21; Creat 1.29; Hemoglobin 14.5; Magnesium 2.2; Platelets 288; Potassium 4.8; Sodium 140; TSH 19.68  personally reviewed   Lipid Panel     Component Value Date/Time   CHOL 142 05/20/2017 1531   TRIG 73 05/20/2017 1531   HDL 37 (L) 05/20/2017 1531   CHOLHDL 3.8 05/20/2017 1531   VLDL 14 12/26/2015 0950   LDLCALC 80 12/26/2015 0950   personally reviewed   Wt Readings from Last 3 Encounters:  06/10/17 234 lb (106.1 kg)  05/28/17 231 lb (104.8 kg)  05/20/17 226 lb 6.4 oz (102.7 kg)      Other studies personally reviewed: Additional studies/ records that were reviewed today include: my prior hospital consult note, echo, Dr Hochrein's recent note  Review of the above records today demonstrates: as above   ASSESSMENT AND PLAN:  1.  Persistent afib Rates are reasonably controlled Given h/o cirrhosis and poorly controlled thyroid function, will stop amiodarone at this time. Consider short duration anticoagulation with eliquis.  I will discuss with Dr Percival Spanish.  If we can anticoagulate, then we should start eliquis 5mg  BID and proceed with admission for sotalol after 3 weeks of anticoagulation.  She may do better if we can achieve sinus rhythm. If she is not a candidate for anticoagulation then rate control will be our option going forward. Cbc and bmet today  2. VT No symptomatic episodes Stop amiodarone Consider repeat echo once atrial arrhythmias are stable Not a candidate for ICD Check tfts, lfts, bmet  3. CHF EF was acutely depressed 04/14/17 by echo Repeat echo once clinically stable  4. Thyroid dysfunction Check  tsh   Follow-up:  Af clinic in 4 weeks for consideration of sotalol load vs rate control  Current medicines are reviewed at length with the patient today.   The patient does not have concerns regarding her medicines.  The following changes were made today:  none    Very complicated patient.  A high level of decision making was required for this encounter.  Complex medical decision making including discussion with Dr Percival Spanish by phone today.  Army Fossa, MD  07/06/2017 2:53 PM     [place] [address] [place] [address] [phone] (office) [phone] (fax)  Addendum: I spoke with Dr Percival Spanish.  We agree that it is prudent to proceed with initiation of eliquis 5mg  BID at this time.  Then sotalol admission once on eliquis for 3 weeks.  Ultimately,  we should try to stop eliquis 4 weeks after she has achieved sinus rhythm.  Thompson Grayer MD, [facility] 07/06/2017 5:37 PM

## 2017-07-06 NOTE — Patient Instructions (Addendum)
Medication Instructions:  Your physician has recommended you make the following change in your medication:  1) Stop Amiodarone   Labwork: Your physician recommends that you return for lab work today: TSH/CMET/BMP   Testing/Procedures: None ordered   Follow-Up: Your physician recommends that you schedule a follow-up appointment in: 4 weeks with Ceasar Lund in [facility]    Any Other Special Instructions Will Be Listed Below (If Applicable).     If you need a refill on your cardiac medications before your next appointment, please call your pharmacy.

## 2017-07-07 ENCOUNTER — Other Ambulatory Visit: Payer: Self-pay | Admitting: Internal Medicine

## 2017-07-07 DIAGNOSIS — E039 Hypothyroidism, unspecified: Secondary | ICD-10-CM

## 2017-07-07 LAB — COMPREHENSIVE METABOLIC PANEL
A/G RATIO: 0.7 — AB (ref 1.2–2.2)
ALBUMIN: 2.6 g/dL — AB (ref 3.5–4.8)
ALT: 30 IU/L (ref 0–32)
AST: 60 IU/L — ABNORMAL HIGH (ref 0–40)
Alkaline Phosphatase: 75 IU/L (ref 39–117)
BILIRUBIN TOTAL: 2 mg/dL — AB (ref 0.0–1.2)
BUN / CREAT RATIO: 11 — AB (ref 12–28)
BUN: 13 mg/dL (ref 8–27)
CHLORIDE: 109 mmol/L — AB (ref 96–106)
CO2: 23 mmol/L (ref 20–29)
Calcium: 8.9 mg/dL (ref 8.7–10.3)
Creatinine, Ser: 1.23 mg/dL — ABNORMAL HIGH (ref 0.57–1.00)
GFR calc non Af Amer: 43 mL/min/{1.73_m2} — ABNORMAL LOW (ref >59)
GFR, EST AFRICAN AMERICAN: 50 mL/min/{1.73_m2} — AB (ref >59)
Globulin, Total: 3.6 g/dL (ref 1.5–4.5)
Glucose: 115 mg/dL — ABNORMAL HIGH (ref 65–99)
POTASSIUM: 4.5 mmol/L (ref 3.5–5.2)
Sodium: 144 mmol/L (ref 134–144)
TOTAL PROTEIN: 6.2 g/dL (ref 6.0–8.5)

## 2017-07-07 LAB — CBC WITH DIFFERENTIAL/PLATELET
BASOS ABS: 0.1 10*3/uL (ref 0.0–0.2)
Basos: 1 %
EOS (ABSOLUTE): 0.2 10*3/uL (ref 0.0–0.4)
Eos: 3 %
HEMOGLOBIN: 14.5 g/dL (ref 11.1–15.9)
Hematocrit: 43.3 % (ref 34.0–46.6)
Immature Grans (Abs): 0 10*3/uL (ref 0.0–0.1)
Immature Granulocytes: 0 %
LYMPHS ABS: 1.2 10*3/uL (ref 0.7–3.1)
Lymphs: 19 %
MCH: 32.2 pg (ref 26.6–33.0)
MCHC: 33.5 g/dL (ref 31.5–35.7)
MCV: 96 fL (ref 79–97)
MONOCYTES: 16 %
Monocytes Absolute: 1 10*3/uL — ABNORMAL HIGH (ref 0.1–0.9)
NEUTROS ABS: 3.9 10*3/uL (ref 1.4–7.0)
Neutrophils: 61 %
Platelets: 357 10*3/uL (ref 150–379)
RBC: 4.5 x10E6/uL (ref 3.77–5.28)
RDW: 17.1 % — ABNORMAL HIGH (ref 12.3–15.4)
WBC: 6.4 10*3/uL (ref 3.4–10.8)

## 2017-07-07 LAB — TSH: TSH: 9.23 u[IU]/mL — ABNORMAL HIGH (ref 0.450–4.500)

## 2017-07-08 ENCOUNTER — Telehealth: Payer: Self-pay | Admitting: *Deleted

## 2017-07-08 DIAGNOSIS — I10 Essential (primary) hypertension: Secondary | ICD-10-CM | POA: Diagnosis not present

## 2017-07-08 MED ORDER — APIXABAN 5 MG PO TABS: 5.0000 mg | ORAL_TABLET | Freq: Two times a day (BID) | ORAL | 3 refills | Status: DC

## 2017-07-08 NOTE — Telephone Encounter (Signed)
Thompson Grayer, MD  Dionicio Stall, RN        Please call her and let her know that Dr Percival Spanish and I have discussed and we would like for her to start eliquis 5mg  BID at this time. Avoid ASA and NSAIDs while on eliquis.

## 2017-07-08 NOTE — Telephone Encounter (Addendum)
Left message on patient's voicemail in regards to Dr Jackalyn Lombard recommendations and to call me back to discuss.  I have sent her prescription for Eliquis 5 mg bid to pharmacy.

## 2017-07-09 ENCOUNTER — Encounter: Payer: Self-pay | Admitting: Internal Medicine

## 2017-07-09 NOTE — Telephone Encounter (Signed)
F/U Call:  Patient returning your call.

## 2017-07-09 NOTE — Telephone Encounter (Signed)
Called home number and left message for patient to call back and ask for Triage. Called mobile number and spoke with a gentleman who said patient could be reached at her home number but has had some intermittent phone problems.

## 2017-07-13 NOTE — Telephone Encounter (Signed)
Left message for patient to call back  

## 2017-07-17 ENCOUNTER — Ambulatory Visit (INDEPENDENT_AMBULATORY_CARE_PROVIDER_SITE_OTHER): Payer: PPO | Admitting: Adult Health

## 2017-07-17 ENCOUNTER — Encounter: Payer: Self-pay | Admitting: Internal Medicine

## 2017-07-17 VITALS — BP 114/76 | HR 60 | Temp 97.3°F | Resp 18 | Ht 65.0 in | Wt 228.2 lb

## 2017-07-17 DIAGNOSIS — R300 Dysuria: Secondary | ICD-10-CM

## 2017-07-17 DIAGNOSIS — R310 Gross hematuria: Secondary | ICD-10-CM

## 2017-07-17 MED ORDER — CEPHALEXIN 500 MG PO CAPS: 500.0000 mg | ORAL_CAPSULE | Freq: Two times a day (BID) | ORAL | 0 refills | Status: DC

## 2017-07-17 NOTE — Telephone Encounter (Signed)
Spoke with patient who was started on Eliquis 5 mg bid about 1 week ago.   This morning she had blood in her urine so she went to her PCP (Dr. Unk Pinto)  [phone]. He' d like for you to call him apparently, per patient.  I told her not to  take this evening dose.    He told her to inform us and he'd like for you to give advice.   Thank you.Marland KitchenMarland Kitchen

## 2017-07-17 NOTE — Progress Notes (Signed)
Assessment and Plan:  Brittney Valentine was seen today for urinary tract infection.  Diagnoses and all orders for this visit:  Gross hematuria Monitor closely over the weekend; should bleeding increase in urine/not improve, develop lower abdominal pain or stop making urine, present to the ED. If bleeding continues despite treatment may need to consult cardiology regarding stopping xarelto; also consider urology referral for cystoscopy for possible superficial tumor that may be bleeding -  -     Urinalysis w microscopic + reflex cultur  Dysuria Will proceed with treatment due to recent pyelonephritis/sepsis -     Urinalysis w microscopic + reflex cultur -     cephALEXin (KEFLEX) 500 MG capsule; Take 1 capsule (500 mg total) by mouth 2 (two) times daily.  Further disposition pending results of labs. Discussed med's effects and SE's.   Over 15 minutes of exam, counseling, chart review, and critical decision making was performed.   Future Appointments  Date Time Provider [place]  08/04/2017  3:30 PM Sherran Needs, NP [facility] None  08/06/2017  2:30 PM [facility] NURSE [facility] None  09/02/2017  3:30 PM Liane Comber, NP [facility] None  09/04/2017  2:45 PM Gatha Mayer, MD [facility]  02/17/2018 10:00 AM Vicie Mutters, PA-C [facility] None    ------------------------------------------------------------------------------------------------------------------   HPI BP 114/76   Pulse 60   Temp (!) 97.3 F (36.3 C)   Resp 18   Ht 5\' 5"  (1.651 m)   Wt 228 lb 3.2 oz (103.5 kg)   BMI 37.97 kg/m   77 y.o.female presents for hematuria that began this morning x 3 episodes.  She endorses mild dysuria. Denies frequency, urgency, abdominal pain/pressure, flank pain, foul odor to urine, incontinence. No fevers/chills, fatigue, malaise, dizziness, weakness.    She was recently admitted for pyelonephritis/sepsis in November 2019. She has extensive cardiac history with atrial  fibrillation, dilated cardiomyopathy and combined systolic/diastolic CHF; previously treated with flecainide and was doing well, but experienced VT and acute reduction in EF during most recent hospitalization and was placed on amiodarone. This was recently discontinued by cardiology on 1/28 and she was started on elequis with plan to initiate sotalol after 3 weeks of anticoagulation.   Past Medical History:  Diagnosis Date  . Anemia   . Blood transfusion without reported diagnosis   . Cirrhosis ([place])   . Degenerative disk disease   . Diverticulosis   . Hemorrhoids   . Hypertension   . Kidney stones   . Obesity   . Paroxysmal atrial fibrillation (HCC)   . Partial small bowel obstruction ([place])   . Personal history of colonic polyps 03/25/2012   8 mm rectal adenoma 03/2012  . Portal hypertensive gastropathy ([place])   . Shingles   . Thyroid disease   . Varices, esophageal ([place])   . Zoster      Allergies  Allergen Reactions  . Ciprofloxacin     Swelling  . Robaxin [Methocarbamol]     palpiations  . Sulfonamide Derivatives     See little dots, skin feels like its burning  . Verapamil     palpitations    Current Outpatient Medications on File Prior to Visit  Medication Sig  . amLODipine (NORVASC) 2.5 MG tablet Take 1 tablet (2.5 mg total) by mouth daily.  Marland Kitchen apixaban (ELIQUIS) 5 MG TABS tablet Take 1 tablet (5 mg total) by mouth 2 (two) times daily.  . Cholecalciferol (VITAMIN D-3) 1000 UNITS CAPS Take 1,000 Units by mouth 2 (two) times daily.   Marland Kitchen  levothyroxine (SYNTHROID, LEVOTHROID) 112 MCG tablet TAKE 1 TABLET BY MOUTH IN THE MORNING  . metoprolol succinate (TOPROL-XL) 100 MG 24 hr tablet Take 1.5 tablets (150mg ) by mouth daily  . ranitidine (ZANTAC) 300 MG tablet Take 1 tablet (300 mg total) at bedtime by mouth.   No current facility-administered medications on file prior to visit.     ROS: Review of Systems  Constitutional: Negative for chills, diaphoresis, fever and  malaise/fatigue.  Eyes: Negative for blurred vision.  Respiratory: Negative for cough, sputum production, shortness of breath and wheezing.   Cardiovascular: Negative for orthopnea, leg swelling and PND.  Gastrointestinal: Negative for abdominal pain, blood in stool, melena and nausea.  Genitourinary: Positive for dysuria and hematuria. Negative for flank pain, frequency and urgency.  Neurological: Negative for dizziness, sensory change, speech change, focal weakness, weakness and headaches.    Physical Exam:  BP 114/76   Pulse 60   Temp (!) 97.3 F (36.3 C)   Resp 18   Ht 5\' 5"  (1.651 m)   Wt 228 lb 3.2 oz (103.5 kg)   BMI 37.97 kg/m   General Appearance: Well nourished, in no apparent distress. Neck: Supple.  Respiratory: Respiratory effort normal, BS equal bilaterally without rales, rhonchi, wheezing or stridor.  Cardio: RRR with no notable MRGs. Brisk peripheral pulses without edema.  Abdomen: Soft, + BS.  Non tender, no guarding, rebound, hernias, masses. Lymphatics: Non tender without lymphadenopathy.  Musculoskeletal: normal gait.  Skin: Warm, dry without rashes, lesions, ecchymosis.  Psych: Awake and oriented X 3, normal affect, Insight and Judgment appropriate.     Izora Ribas, NP 12:22 PM [facility]

## 2017-07-17 NOTE — Patient Instructions (Addendum)
Call your heart doctor and let him know you started bleeding - ask if ok to stop for 1 week to clear and and restart.   Over the weekend - if bleeding becomes worse, stop elequis and go to ER - also present if you are unable to produce urine, develop fever/chills/confusion or have severe lower abdominal pain.    Hydrate well - push water - 80+ fluid ounces daily     Urinary Tract Infection, Adult A urinary tract infection (UTI) is an infection of any part of the urinary tract. The urinary tract includes the:  Kidneys.  Ureters.  Bladder.  Urethra.  These organs make, store, and get rid of pee (urine) in the body. Follow these instructions at home:  Take over-the-counter and prescription medicines only as told by your doctor.  If you were prescribed an antibiotic medicine, take it as told by your doctor. Do not stop taking the antibiotic even if you start to feel better.  Avoid the following drinks: ? Alcohol. ? Caffeine. ? Tea. ? Carbonated drinks.  Drink enough fluid to keep your pee clear or pale yellow.  Keep all follow-up visits as told by your doctor. This is important.  Make sure to: ? Empty your bladder often and completely. Do not to hold pee for long periods of time. ? Empty your bladder before and after sex. ? Wipe from front to back after a bowel movement if you are female. Use each tissue one time when you wipe. Contact a doctor if:  You have back pain.  You have a fever.  You feel sick to your stomach (nauseous).  You throw up (vomit).  Your symptoms do not get better after 3 days.  Your symptoms go away and then come back. Get help right away if:  You have very bad back pain.  You have very bad lower belly (abdominal) pain.  You are throwing up and cannot keep down any medicines or water. This information is not intended to replace advice given to you by your health care provider. Make sure you discuss any questions you have with your health  care provider. Document Released: 11/12/2007 Document Revised: 11/01/2015 Document Reviewed: 04/16/2015 Elsevier Interactive Patient Education  [employer].

## 2017-07-17 NOTE — Telephone Encounter (Signed)
Spoke with patient and told her NOT to hold Eliquis and urinary blood may be caused by her UTI.  She will monitor over the weekend and call us Monday.

## 2017-07-20 ENCOUNTER — Telehealth: Payer: Self-pay

## 2017-07-20 LAB — URINALYSIS W MICROSCOPIC + REFLEX CULTURE
BILIRUBIN URINE: NEGATIVE
Glucose, UA: NEGATIVE
HYALINE CAST: NONE SEEN /LPF
Ketones, ur: NEGATIVE
Nitrites, Initial: POSITIVE — AB
Specific Gravity, Urine: 1.021 (ref 1.001–1.03)
pH: 5.5 (ref 5.0–8.0)

## 2017-07-20 LAB — URINE CULTURE
MICRO NUMBER: 90177937
SPECIMEN QUALITY: ADEQUATE

## 2017-07-20 LAB — CULTURE INDICATED

## 2017-07-20 NOTE — Telephone Encounter (Signed)
Spoke with patient this morning and she past along some great news.    She has not had any blood in her urine over the weekend and she had continued taking her Eliquis.

## 2017-08-04 ENCOUNTER — Ambulatory Visit (HOSPITAL_COMMUNITY)
Admission: RE | Admit: 2017-08-04 | Discharge: 2017-08-04 | Disposition: A | Discharge status: Home / Self Care | Payer: PPO | Source: Ambulatory Visit | Attending: Nurse Practitioner | Admitting: Nurse Practitioner

## 2017-08-04 ENCOUNTER — Other Ambulatory Visit: Payer: Self-pay | Admitting: Internal Medicine

## 2017-08-04 ENCOUNTER — Encounter (HOSPITAL_COMMUNITY): Payer: Self-pay | Admitting: Nurse Practitioner

## 2017-08-04 VITALS — BP 122/84 | HR 90 | Ht 65.0 in | Wt 231.0 lb

## 2017-08-04 DIAGNOSIS — I4819 Other persistent atrial fibrillation: Secondary | ICD-10-CM

## 2017-08-04 DIAGNOSIS — Z79899 Other long term (current) drug therapy: Secondary | ICD-10-CM | POA: Insufficient documentation

## 2017-08-04 DIAGNOSIS — I8510 Secondary esophageal varices without bleeding: Secondary | ICD-10-CM | POA: Insufficient documentation

## 2017-08-04 DIAGNOSIS — R945 Abnormal results of liver function studies: Secondary | ICD-10-CM

## 2017-08-04 DIAGNOSIS — Z87442 Personal history of urinary calculi: Secondary | ICD-10-CM | POA: Diagnosis not present

## 2017-08-04 DIAGNOSIS — E669 Obesity, unspecified: Secondary | ICD-10-CM | POA: Insufficient documentation

## 2017-08-04 DIAGNOSIS — I10 Essential (primary) hypertension: Secondary | ICD-10-CM | POA: Diagnosis not present

## 2017-08-04 DIAGNOSIS — E079 Disorder of thyroid, unspecified: Secondary | ICD-10-CM | POA: Diagnosis not present

## 2017-08-04 DIAGNOSIS — R9431 Abnormal electrocardiogram [ECG] [EKG]: Secondary | ICD-10-CM | POA: Diagnosis not present

## 2017-08-04 DIAGNOSIS — K7460 Unspecified cirrhosis of liver: Secondary | ICD-10-CM | POA: Diagnosis not present

## 2017-08-04 DIAGNOSIS — Z79890 Hormone replacement therapy (postmenopausal): Secondary | ICD-10-CM | POA: Insufficient documentation

## 2017-08-04 DIAGNOSIS — I480 Paroxysmal atrial fibrillation: Secondary | ICD-10-CM | POA: Insufficient documentation

## 2017-08-04 DIAGNOSIS — R7989 Other specified abnormal findings of blood chemistry: Secondary | ICD-10-CM

## 2017-08-04 DIAGNOSIS — D649 Anemia, unspecified: Secondary | ICD-10-CM

## 2017-08-04 DIAGNOSIS — I481 Persistent atrial fibrillation: Secondary | ICD-10-CM | POA: Diagnosis not present

## 2017-08-04 DIAGNOSIS — I4891 Unspecified atrial fibrillation: Secondary | ICD-10-CM | POA: Diagnosis present

## 2017-08-04 DIAGNOSIS — Z882 Allergy status to sulfonamides status: Secondary | ICD-10-CM | POA: Insufficient documentation

## 2017-08-04 DIAGNOSIS — N289 Disorder of kidney and ureter, unspecified: Secondary | ICD-10-CM

## 2017-08-04 DIAGNOSIS — Z9049 Acquired absence of other specified parts of digestive tract: Secondary | ICD-10-CM | POA: Diagnosis not present

## 2017-08-04 DIAGNOSIS — Z7901 Long term (current) use of anticoagulants: Secondary | ICD-10-CM | POA: Insufficient documentation

## 2017-08-04 DIAGNOSIS — Z881 Allergy status to other antibiotic agents status: Secondary | ICD-10-CM | POA: Diagnosis not present

## 2017-08-04 NOTE — Progress Notes (Addendum)
Primary Care Physician: Unk Pinto, MD Referring Physician: Dr. Meryl Crutch SCOTT FIX is a 77 y.o. female with a h/o afib, for many years controlled on flecainide. But in the setting of acute illness, 04/14/17, it returned. She also had VT in the hospital with acute reduction in EF. She was placed on amiodarone. This  was stopped at the time of Dr. Jackalyn Lombard visit, 07/06/17,  for significant thyroid dysfunction. His plan was to start anticoagulation and in 2-4 weeks, consider hospitalization for sotalol. She has h/o cirrhosis and esophageal varices and was not on anticoagulation.. She is tolerating eliquis 5 mg bid. She did have some hematuria but has resolved and she did not have to stop anticoagulation.No other signs of bleeding.  In the clinic today, discussed re sotalol to achieve NSR and necessary hospitalization. Pt is very clear that she does not want hospitalization at this time. She is just now starting to feel a little better and does not want to come into the hospital or take another med.  Today, she denies symptoms of palpitations, chest pain, shortness of breath, orthopnea, PND, lower extremity edema, dizziness, presyncope, syncope, or neurologic sequela. The patient is tolerating medications without difficulties and is otherwise without complaint today.   Past Medical History:  Diagnosis Date  . Anemia   . Blood transfusion without reported diagnosis   . Cirrhosis ([place])   . Degenerative disk disease   . Diverticulosis   . Hemorrhoids   . Hypertension   . Kidney stones   . Obesity   . Paroxysmal atrial fibrillation (HCC)   . Partial small bowel obstruction ([place])   . Personal history of colonic polyps 03/25/2012   8 mm rectal adenoma 03/2012  . Portal hypertensive gastropathy ([place])   . Shingles   . Thyroid disease   . Varices, esophageal ([place])   . Zoster    Past Surgical History:  Procedure Laterality Date  . ABDOMINAL HYSTERECTOMY    . APPENDECTOMY  1991  .  CHOLECYSTECTOMY  2000  . COLON SURGERY    . COLON SURGERY    . COLONOSCOPY    . ESOPHAGOGASTRODUODENOSCOPY  multiple    Current Outpatient Medications  Medication Sig Dispense Refill  . amLODipine (NORVASC) 2.5 MG tablet Take 1 tablet (2.5 mg total) by mouth daily. 30 tablet 2  . apixaban (ELIQUIS) 5 MG TABS tablet Take 1 tablet (5 mg total) by mouth 2 (two) times daily. 60 tablet 3  . Cholecalciferol (VITAMIN D-3) 1000 UNITS CAPS Take 1,000 Units by mouth 2 (two) times daily.     Marland Kitchen levothyroxine (SYNTHROID, LEVOTHROID) 112 MCG tablet TAKE 1 TABLET BY MOUTH IN THE MORNING 90 tablet 0  . metoprolol succinate (TOPROL-XL) 100 MG 24 hr tablet Take 1.5 tablets (150mg ) by mouth daily 135 tablet 1  . ranitidine (ZANTAC) 300 MG tablet Take 1 tablet (300 mg total) at bedtime by mouth. 30 tablet 1   No current facility-administered medications for this encounter.     Allergies  Allergen Reactions  . Ciprofloxacin     Swelling  . Robaxin [Methocarbamol]     palpiations  . Sulfonamide Derivatives     See little dots, skin feels like its burning  . Verapamil     palpitations    Social History   Socioeconomic History  . Marital status: Single    Spouse name: Not on file  . Number of children: Not on file  . Years of education: Not on file  .  Highest education level: Not on file  Social Needs  . Financial resource strain: Not on file  . Food insecurity - worry: Not on file  . Food insecurity - inability: Not on file  . Transportation needs - medical: Not on file  . Transportation needs - non-medical: Not on file  Occupational History  . Not on file  Tobacco Use  . Smoking status: Never Smoker  . Smokeless tobacco: Never Used  Substance and Sexual Activity  . Alcohol use: No  . Drug use: No  . Sexual activity: Not on file  Other Topics Concern  . Not on file  Social History Narrative  . Not on file    Family History  Problem Relation Age of Onset  . Heart failure Mother         died from  . Hypertension Mother   . Heart attack Father        died from  . Hypertension Sister     ROS- All systems are reviewed and negative except as per the HPI above  Physical Exam: Vitals:   08/04/17 1520  BP: 122/84  Pulse: 90  Weight: 231 lb (104.8 kg)  Height: 5\' 5"  (1.651 m)   Wt Readings from Last 3 Encounters:  08/04/17 231 lb (104.8 kg)  07/17/17 228 lb 3.2 oz (103.5 kg)  07/06/17 228 lb (103.4 kg)    Labs: Lab Results  Component Value Date   NA 144 07/06/2017   K 4.5 07/06/2017   CL 109 (H) 07/06/2017   CO2 23 07/06/2017   GLUCOSE 115 (H) 07/06/2017   BUN 13 07/06/2017   CREATININE 1.23 (H) 07/06/2017   CALCIUM 8.9 07/06/2017   PHOS 5.5 (H) 04/12/2017   MG 2.2 05/20/2017   Lab Results  Component Value Date   INR 1.64 04/12/2017   Lab Results  Component Value Date   CHOL 142 05/20/2017   HDL 37 (L) 05/20/2017   LDLCALC 80 12/26/2015   TRIG 73 05/20/2017     GEN- The patient is well appearing, alert and oriented x 3 today.   Head- normocephalic, atraumatic Eyes-  Sclera clear, conjunctiva pink Ears- hearing intact Oropharynx- clear Neck- supple, no JVP Lymph- no cervical lymphadenopathy Lungs- Clear to ausculation bilaterally, normal work of breathing Heart- irregular rate and rhythm, no murmurs, rubs or gallops, PMI not laterally displaced GI- soft, NT, ND, + BS Extremities- no clubbing, cyanosis, or edema MS- no significant deformity or atrophy Skin- no rash or lesion Psych- euthymic mood, full affect Neuro- strength and sensation are intact  EKG- afib at 90 bpm, Qrs int 84 ms, Qtc 455 ms Epic records reviewed Echo-Study Conclusions  - Left ventricle: The cavity size was normal. Wall thickness was   increased in a pattern of mild LVH. Systolic function was   moderately reduced. The estimated ejection fraction was in the   range of 35% to 40%. Diffuse hypokinesis. Indeterminant diastolic   function (atrial  fibrillation). - Aortic valve: There was no stenosis. - Mitral valve: Mildly calcified annulus. There was mild   regurgitation. - Left atrium: The atrium was moderately dilated. - Right ventricle: The cavity size was normal. Systolic function   was normal. - Right atrium: The atrium was mildly dilated. - Tricuspid valve: Peak RV-RA gradient (S): 24 mm Hg. - Systemic veins: IVC was not visualized.  Impressions:  - The patient was in atrial fibrillation. Normal LV size with mild   LV hypertrophy. EF 35-40%, diffuse hypokinesis. Normal RV  size   and systolic function. Mild mitral regurgitation.    Assessment and Plan: 1. Persistent afib Off amiodarone and previous flecainde General info re afib and restoring rhythm with sotalol to  help with heart function/necessary  hosptilization to start Pt is very clear that she does not want to go back into the hospital at this time or start a new drug She is now starting to feel better and does not " want to upset the apple cart" Reasonable rate control, continue metoprolol   2.Chadsvasc score of 4 Pt is on new start eliquis 5 mg bid x 4 weeks H/o cirrhosis, esophageal  varices  So far tolerating without bleeding issues, other than some mild hematuria that cleared I wanted to draw CBC and cmet for new anticoagulation but she declined States  has labs drawn with PCP on Thursday I will notify PCP that these labs should be drawn to monitor new start anticoagulation  She is agreeable to return in one month and will discuss again start of sotalol  Butch Penny C. Zakyah Yanes, [facility] [address] [place], [place] [address] [phone]

## 2017-08-06 ENCOUNTER — Ambulatory Visit (INDEPENDENT_AMBULATORY_CARE_PROVIDER_SITE_OTHER): Payer: PPO

## 2017-08-06 DIAGNOSIS — R945 Abnormal results of liver function studies: Secondary | ICD-10-CM

## 2017-08-06 DIAGNOSIS — Z79899 Other long term (current) drug therapy: Secondary | ICD-10-CM | POA: Diagnosis not present

## 2017-08-06 DIAGNOSIS — D649 Anemia, unspecified: Secondary | ICD-10-CM

## 2017-08-06 DIAGNOSIS — N289 Disorder of kidney and ureter, unspecified: Secondary | ICD-10-CM

## 2017-08-06 DIAGNOSIS — R7989 Other specified abnormal findings of blood chemistry: Secondary | ICD-10-CM

## 2017-08-06 DIAGNOSIS — I10 Essential (primary) hypertension: Secondary | ICD-10-CM | POA: Diagnosis not present

## 2017-08-06 DIAGNOSIS — E039 Hypothyroidism, unspecified: Secondary | ICD-10-CM

## 2017-08-06 NOTE — Progress Notes (Signed)
Patient presents to the office for a nurse visit to have labs drawn. Medication reviewed and verified by patient. No longer taking Keflex and not having any signs or symptoms of having a UTI.

## 2017-08-07 LAB — BASIC METABOLIC PANEL WITH GFR
BUN: 15 mg/dL (ref 7–25)
CALCIUM: 9.3 mg/dL (ref 8.6–10.4)
CO2: 30 mmol/L (ref 20–32)
CREATININE: 0.87 mg/dL (ref 0.60–0.93)
Chloride: 107 mmol/L (ref 98–110)
GFR, EST NON AFRICAN AMERICAN: 66 mL/min/{1.73_m2} (ref >=60)
GFR, Est African American: 76 mL/min/{1.73_m2} (ref >=60)
Glucose, Bld: 82 mg/dL (ref 65–99)
Potassium: 5.2 mmol/L (ref 3.5–5.3)
Sodium: 140 mmol/L (ref 135–146)

## 2017-08-07 LAB — HEPATIC FUNCTION PANEL
AG Ratio: 0.9 (calc) — ABNORMAL LOW (ref 1.0–2.5)
ALT: 26 U/L (ref 6–29)
AST: 52 U/L — ABNORMAL HIGH (ref 10–35)
Albumin: 2.9 g/dL — ABNORMAL LOW (ref 3.6–5.1)
Alkaline phosphatase (APISO): 80 U/L (ref 33–130)
Bilirubin, Direct: 0.4 mg/dL — ABNORMAL HIGH (ref 0.0–0.2)
Globulin: 3.3 g/dL (calc) (ref 1.9–3.7)
Indirect Bilirubin: 1 mg/dL (calc) (ref 0.2–1.2)
Total Bilirubin: 1.4 mg/dL — ABNORMAL HIGH (ref 0.2–1.2)
Total Protein: 6.2 g/dL (ref 6.1–8.1)

## 2017-08-07 LAB — CBC WITH DIFFERENTIAL/PLATELET
BASOS ABS: 38 {cells}/uL (ref 0–200)
Basophils Relative: 0.8 %
Eosinophils Absolute: 120 cells/uL (ref 15–500)
Eosinophils Relative: 2.5 %
HEMATOCRIT: 41.5 % (ref 35.0–45.0)
HEMOGLOBIN: 13.9 g/dL (ref 11.7–15.5)
LYMPHS ABS: 917 {cells}/uL (ref 850–3900)
MCH: 32 pg (ref 27.0–33.0)
MCHC: 33.5 g/dL (ref 32.0–36.0)
MCV: 95.6 fL (ref 80.0–100.0)
MPV: 11.3 fL (ref 7.5–12.5)
Monocytes Relative: 16.8 %
NEUTROS ABS: 2918 {cells}/uL (ref 1500–7800)
Neutrophils Relative %: 60.8 %
Platelets: 299 10*3/uL (ref 140–400)
RBC: 4.34 10*6/uL (ref 3.80–5.10)
RDW: 13.8 % (ref 11.0–15.0)
Total Lymphocyte: 19.1 %
WBC: 4.8 10*3/uL (ref 3.8–10.8)
WBCMIX: 806 {cells}/uL (ref 200–950)

## 2017-08-07 LAB — TSH: TSH: 9.97 mIU/L — ABNORMAL HIGH (ref 0.40–4.50)

## 2017-08-25 ENCOUNTER — Other Ambulatory Visit: Payer: Self-pay | Admitting: Internal Medicine

## 2017-08-26 ENCOUNTER — Ambulatory Visit (HOSPITAL_COMMUNITY)
Admission: RE | Admit: 2017-08-26 | Discharge: 2017-08-26 | Disposition: A | Discharge status: Home / Self Care | Payer: PPO | Source: Ambulatory Visit | Attending: Nurse Practitioner | Admitting: Nurse Practitioner

## 2017-08-26 ENCOUNTER — Encounter (HOSPITAL_COMMUNITY): Payer: Self-pay | Admitting: Nurse Practitioner

## 2017-08-26 VITALS — BP 118/76 | HR 89 | Ht 65.0 in | Wt 218.0 lb

## 2017-08-26 DIAGNOSIS — Z79899 Other long term (current) drug therapy: Secondary | ICD-10-CM | POA: Insufficient documentation

## 2017-08-26 DIAGNOSIS — I481 Persistent atrial fibrillation: Secondary | ICD-10-CM | POA: Diagnosis not present

## 2017-08-26 DIAGNOSIS — Z90710 Acquired absence of both cervix and uterus: Secondary | ICD-10-CM | POA: Diagnosis not present

## 2017-08-26 DIAGNOSIS — Z9889 Other specified postprocedural states: Secondary | ICD-10-CM | POA: Diagnosis not present

## 2017-08-26 DIAGNOSIS — Z9049 Acquired absence of other specified parts of digestive tract: Secondary | ICD-10-CM | POA: Diagnosis not present

## 2017-08-26 DIAGNOSIS — Z881 Allergy status to other antibiotic agents status: Secondary | ICD-10-CM | POA: Diagnosis not present

## 2017-08-26 DIAGNOSIS — I4819 Other persistent atrial fibrillation: Secondary | ICD-10-CM

## 2017-08-26 DIAGNOSIS — I10 Essential (primary) hypertension: Secondary | ICD-10-CM | POA: Diagnosis not present

## 2017-08-26 DIAGNOSIS — Z882 Allergy status to sulfonamides status: Secondary | ICD-10-CM | POA: Diagnosis not present

## 2017-08-26 DIAGNOSIS — Z8249 Family history of ischemic heart disease and other diseases of the circulatory system: Secondary | ICD-10-CM | POA: Diagnosis not present

## 2017-08-26 NOTE — Progress Notes (Deleted)
   Subjective:    Patient ID: Brittney Valentine, female    DOB: Aug 19, 1940, 77 y.o.   MRN: [id number]  HPI   tyreated 2/8-2/13 with Keflex x 5 days for UTI    Outpatient Medications Prior to Visit  Medication Sig Dispense Refill  . amLODipine (NORVASC) 2.5 MG tablet TAKE ONE TABLET EVERY DAY 30 tablet 2  . apixaban (ELIQUIS) 5 MG TABS tablet Take 1 tablet (5 mg total) by mouth 2 (two) times daily. 60 tablet 3  . Cholecalciferol (VITAMIN D-3) 1000 UNITS CAPS Take 1,000 Units by mouth 2 (two) times daily.     Marland Kitchen levothyroxine (SYNTHROID, LEVOTHROID) 112 MCG tablet TAKE 1 TABLET BY MOUTH IN THE MORNING 90 tablet 0  . metoprolol succinate (TOPROL-XL) 100 MG 24 hr tablet Take 1.5 tablets (150mg ) by mouth daily 135 tablet 1  . ranitidine (ZANTAC) 300 MG tablet Take 1 tablet (300 mg total) at bedtime by mouth. 30 tablet 1   No facility-administered medications prior to visit.      Review of Systems     Objective:   Physical Exam        Assessment & Plan:

## 2017-08-26 NOTE — Progress Notes (Signed)
Primary Care Physician: Unk Pinto, MD Referring Physician: Dr. Meryl Crutch Brittney Valentine is a 77 y.o. female with a h/o afib, for many years controlled on flecainide. But in the setting of acute illness, 04/14/17, it returned. She also had VT in the hospital with acute reduction in EF. She was placed on amiodarone. This  was stopped at the time of Dr. Jackalyn Lombard visit, 07/06/17,  for significant thyroid dysfunction. His plan was to start anticoagulation and in 2-4 weeks, consider hospitalization for sotalol. She has h/o cirrhosis and esophageal varices and was not on anticoagulation. She  was placed on eliquis 5 mg bid after Dr. Rayann Heman discussed with Dr. Percival Spanish. She did have some hematuria but has resolved and she did not have to stop anticoagulation.No other signs of bleeding.  In the clinic 2/26, we discussed Dr.Allred's plan to start sotalol to achieve NSR, after on anticoagulation x 3 weeks and necessary hospitalization. Pt is very clear that she does not want hospitalization at this time. She is just now starting to feel a little better and does not want to come into the hospital or take another med.  F/u in afib clinic, 3/20, one month from above visit to discuss sotalol admission. She has been taking anticoagulation as ordered.  Pt reports over the last month, she has had multiple clear stools a day and has lost 13 lbs.  NO blood noted in diarrhea.She reports taking Keflex in February for UTI. She thought it might be from eliquis but she has not called here or to PCP to report diarrhea. She has lost 13 lbs. She is still fairly asymptomatic with rate controlled afib, but still does not want to entertain the thought of starting sotalol now, best to wait until the diarrhea can be resolved.   Today, she denies symptoms of palpitations, chest pain, shortness of breath, orthopnea, PND, lower extremity edema, dizziness, presyncope, syncope, or neurologic sequela. The patient is tolerating  medications without difficulties and is otherwise without complaint today.   Past Medical History:  Diagnosis Date  . Anemia   . Blood transfusion without reported diagnosis   . Cirrhosis ([place])   . Degenerative disk disease   . Diverticulosis   . Hemorrhoids   . Hypertension   . Kidney stones   . Obesity   . Paroxysmal atrial fibrillation ([facility])   . Partial small bowel obstruction ([place])   . Personal history of colonic polyps 03/25/2012   8 mm rectal adenoma 03/2012  . Portal hypertensive gastropathy ([place])   . Shingles   . Thyroid disease   . Varices, esophageal ([place])   . Zoster    Past Surgical History:  Procedure Laterality Date  . ABDOMINAL HYSTERECTOMY    . APPENDECTOMY  1991  . CHOLECYSTECTOMY  2000  . COLON SURGERY    . COLON SURGERY    . COLONOSCOPY    . ESOPHAGOGASTRODUODENOSCOPY  multiple    Current Outpatient Medications  Medication Sig Dispense Refill  . amLODipine (NORVASC) 2.5 MG tablet TAKE ONE TABLET EVERY DAY 30 tablet 2  . apixaban (ELIQUIS) 5 MG TABS tablet Take 1 tablet (5 mg total) by mouth 2 (two) times daily. 60 tablet 3  . Cholecalciferol (VITAMIN D-3) 1000 UNITS CAPS Take 1,000 Units by mouth 2 (two) times daily.     Marland Kitchen levothyroxine (SYNTHROID, LEVOTHROID) 112 MCG tablet TAKE 1 TABLET BY MOUTH IN THE MORNING 90 tablet 0  . metoprolol succinate (TOPROL-XL) 100 MG 24 hr tablet Take 1.5  tablets (150mg ) by mouth daily 135 tablet 1  . ranitidine (ZANTAC) 300 MG tablet Take 1 tablet (300 mg total) at bedtime by mouth. 30 tablet 1   No current facility-administered medications for this encounter.     Allergies  Allergen Reactions  . Ciprofloxacin     Swelling  . Robaxin [Methocarbamol]     palpiations  . Sulfonamide Derivatives     See little dots, skin feels like its burning  . Verapamil     palpitations    Social History   Socioeconomic History  . Marital status: Single    Spouse name: Not on file  . Number of children: Not on file  .  Years of education: Not on file  . Highest education level: Not on file  Social Needs  . Financial resource strain: Not on file  . Food insecurity - worry: Not on file  . Food insecurity - inability: Not on file  . Transportation needs - medical: Not on file  . Transportation needs - non-medical: Not on file  Occupational History  . Not on file  Tobacco Use  . Smoking status: Never Smoker  . Smokeless tobacco: Never Used  Substance and Sexual Activity  . Alcohol use: No  . Drug use: No  . Sexual activity: Not on file  Other Topics Concern  . Not on file  Social History Narrative  . Not on file    Family History  Problem Relation Age of Onset  . Heart failure Mother        died from  . Hypertension Mother   . Heart attack Father        died from  . Hypertension Sister     ROS- All systems are reviewed and negative except as per the HPI above  Physical Exam: Vitals:   08/26/17 1438  BP: 118/76  Pulse: 89  Weight: 218 lb (98.9 kg)  Height: 5\' 5"  (1.651 m)   Wt Readings from Last 3 Encounters:  08/26/17 218 lb (98.9 kg)  08/04/17 231 lb (104.8 kg)  07/17/17 228 lb 3.2 oz (103.5 kg)    Labs: Lab Results  Component Value Date   NA 140 08/06/2017   K 5.2 08/06/2017   CL 107 08/06/2017   CO2 30 08/06/2017   GLUCOSE 82 08/06/2017   BUN 15 08/06/2017   CREATININE 0.87 08/06/2017   CALCIUM 9.3 08/06/2017   PHOS 5.5 (H) 04/12/2017   MG 2.2 05/20/2017   Lab Results  Component Value Date   INR 1.64 04/12/2017   Lab Results  Component Value Date   CHOL 142 05/20/2017   HDL 37 (L) 05/20/2017   LDLCALC 89 05/20/2017   TRIG 73 05/20/2017     GEN- The patient is well appearing, alert and oriented x 3 today.   Head- normocephalic, atraumatic Eyes-  Sclera clear, conjunctiva pink Ears- hearing intact Oropharynx- clear Neck- supple, no JVP Lymph- no cervical lymphadenopathy Lungs- Clear to ausculation bilaterally, normal work of breathing Heart- irregular  rate and rhythm, no murmurs, rubs or gallops, PMI not laterally displaced GI- soft, NT, ND, + BS Extremities- no clubbing, cyanosis, or edema MS- no significant deformity or atrophy Skin- no rash or lesion Psych- euthymic mood, full affect Neuro- strength and sensation are intact  EKG- afib at 89 bpm, Qrs int 82 ms, Qtc 467 ms Epic records reviewed Echo-Study Conclusions  - Left ventricle: The cavity size was normal. Wall thickness was   increased in a pattern of  mild LVH. Systolic function was   moderately reduced. The estimated ejection fraction was in the   range of 35% to 40%. Diffuse hypokinesis. Indeterminant diastolic   function (atrial fibrillation). - Aortic valve: There was no stenosis. - Mitral valve: Mildly calcified annulus. There was mild   regurgitation. - Left atrium: The atrium was moderately dilated. - Right ventricle: The cavity size was normal. Systolic function   was normal. - Right atrium: The atrium was mildly dilated. - Tricuspid valve: Peak RV-RA gradient (S): 24 mm Hg. - Systemic veins: IVC was not visualized.  Impressions:  - The patient was in atrial fibrillation. Normal LV size with mild   LV hypertrophy. EF 35-40%, diffuse hypokinesis. Normal RV size   and systolic function. Mild mitral regurgitation.    Assessment and Plan: 1. Persistent afib Off amiodarone and previous flecainde General info re afib and restoring rhythm with sotalol to  help with heart function/necessary  hosptilization to start, pt does not want to start at this time Reasonable rate control, continue metoprolol   2.Chadsvasc score of 4 Pt has been on eliquis 5 mg bid  Now since last of January H/o cirrhosis, esophageal  varices  So far tolerating without bleeding issues Pt questioned  If diarrhea was  because of start of eliquis but I have not seen this clinically , also asked PharmD and she did not think DOAC was to blame as well  3. Multiple clear stools x one month  with 13 lb weight loss She did take antibiotics  in February for UTI Questioning C diff She would not let me draw labs to check for dehydration ( hard stick) and has an appointment with PCP office next Wednesday I called the office and they are able to see her tomorrow at 4pm  I will see her back in one month and will further discuss sotalol admission   Butch Penny C. Izeyah Deike, [facility] [address] [place], [place] [address] [phone]

## 2017-08-27 ENCOUNTER — Ambulatory Visit (INDEPENDENT_AMBULATORY_CARE_PROVIDER_SITE_OTHER): Payer: PPO | Admitting: Physician Assistant

## 2017-08-27 ENCOUNTER — Ambulatory Visit: Payer: Self-pay | Admitting: Internal Medicine

## 2017-08-27 ENCOUNTER — Encounter: Payer: Self-pay | Admitting: Physician Assistant

## 2017-08-27 VITALS — BP 110/60 | HR 102 | Temp 97.4°F | Resp 14 | Ht 65.0 in | Wt 219.6 lb

## 2017-08-27 DIAGNOSIS — A09 Infectious gastroenteritis and colitis, unspecified: Secondary | ICD-10-CM | POA: Diagnosis not present

## 2017-08-27 DIAGNOSIS — R310 Gross hematuria: Secondary | ICD-10-CM

## 2017-08-27 MED ORDER — METRONIDAZOLE 500 MG PO TABS: 500.0000 mg | ORAL_TABLET | Freq: Three times a day (TID) | ORAL | 0 refills | Status: DC

## 2017-08-27 NOTE — Progress Notes (Signed)
Subjective:    Patient ID: Brittney Valentine, female    DOB: [birth year], 76 y.o.   MRN: [id number]  HPI 77 y.o. obese WF presents with diarrhea.   She has had diarrhea since that time, every time after she eats, at least 5 a day or more, she has had some nausea but no vomiting. No AB pain.  Patient denies blood in stool, fever, recent travel, significant abdominal pain, unintentional weight loss. She did start eliquis in Jan/Feb. She was on ABX in Feb for UTI, keflex started, today in the office she continues to have hematuria. No mucus, blood in stool, no black stool, no fever chills. No recent sick contacts. She is on thyroid med.  No weakness, dizziness, she is eating/drinking.  Nothing makes the diarrhea better or worse.   She had a recent increase in her thyroid medication to 1.5 tablets EERY DAY BUT was suppose to be on 4 days and 1 tablet 3 days a week. The last week she has been on 1 a day.  Lab Results  Component Value Date   TSH 9.97 (H) 08/06/2017   BMI is Body mass index is 36.54 kg/m. Weight is stable.  Wt Readings from Last 3 Encounters:  08/27/17 219 lb 9.6 oz (99.6 kg)  08/26/17 218 lb (98.9 kg)  08/04/17 231 lb (104.8 kg)    Blood pressure 110/60, pulse (!) 102, temperature (!) 97.4 F (36.3 C), resp. rate 14, height 5\' 5"  (1.651 m), weight 219 lb 9.6 oz (99.6 kg), SpO2 93 %.  Medications Current Outpatient Medications on File Prior to Visit  Medication Sig  . amLODipine (NORVASC) 2.5 MG tablet TAKE ONE TABLET EVERY DAY  . apixaban (ELIQUIS) 5 MG TABS tablet Take 1 tablet (5 mg total) by mouth 2 (two) times daily.  . Cholecalciferol (VITAMIN D-3) 1000 UNITS CAPS Take 1,000 Units by mouth 2 (two) times daily.   Marland Kitchen levothyroxine (SYNTHROID, LEVOTHROID) 112 MCG tablet TAKE 1 TABLET BY MOUTH IN THE MORNING  . metoprolol succinate (TOPROL-XL) 100 MG 24 hr tablet Take 1.5 tablets (150mg ) by mouth daily  . ranitidine (ZANTAC) 300 MG tablet Take 1 tablet (300 mg total) at  bedtime by mouth.   No current facility-administered medications on file prior to visit.     Problem list She has Obesity; History of small bowel obstruction; Hepatic cirrhosis ([place]); PORTAL HYPERTENSION; History of colonic polyps; Esophageal varices ([place]); Left sided sciatica; Atrial fibrillation ([place]); Medication management; Vitamin D deficiency; Encounter for Medicare annual wellness exam; Essential hypertension; Prediabetes; Hypothyroidism; Obstructive sleep apnea; Reactive airway disease, mild intermittent, uncomplicated; Sepsis ([place]); Dilated cardiomyopathy ([place]); Contraindication to anticoagulation therapy; Chronic combined systolic and diastolic CHF (congestive heart failure) ([place]); and Fatigue on their problem list.   Review of Systems  Constitutional: Positive for fatigue. Negative for chills, diaphoresis and fever.  HENT: Negative.   Respiratory: Negative.  Negative for cough.   Cardiovascular: Negative.   Gastrointestinal: Positive for diarrhea and nausea. Negative for abdominal distention, abdominal pain, anal bleeding, blood in stool, constipation, rectal pain and vomiting.       + GERD  Genitourinary: Negative.   Musculoskeletal: Negative for arthralgias, back pain, gait problem, joint swelling, myalgias, neck pain and neck stiffness.  Skin: Negative.   Neurological: Negative for dizziness and headaches.       Objective:   Physical Exam  Constitutional: She is oriented to person, place, and time. She appears well-developed and well-nourished. No distress.  HENT:  Head: Normocephalic and  atraumatic.  Mouth/Throat: No oropharyngeal exudate.  Eyes: Pupils are equal, round, and reactive to light. Conjunctivae are normal.  Neck: Normal range of motion. Neck supple.  Cardiovascular: Normal pulses. An irregularly irregular rhythm present.  No extrasystoles are present. Tachycardia present.  Pulmonary/Chest: Effort normal and breath sounds normal. She has no wheezes.   Abdominal: Soft. Bowel sounds are normal. She exhibits no distension and no mass. There is tenderness (some epigastric tenderness, no RUQ/murphy's, generalized lower AB tenderness). There is no rebound and no guarding.  Musculoskeletal: Normal range of motion.  Lymphadenopathy:    She has no cervical adenopathy.  Neurological: She is alert and oriented to person, place, and time. No cranial nerve deficit.  Skin: Skin is warm and dry. No rash noted.  Psychiatric: She has a normal mood and affect. Her behavior is normal.      Assessment & Plan:  Diarrhea with benign abdomen exam  Antibiotic associated diarrhea Suspect C. difficile:  Appropriate educational material discussed and distributed. Empiric antibiotics. Stool studies per orders. will start on flaygl AFTER collection of stool Check labs. The 'BRAT' diet is suggested, then progress to diet as tolerated.  Get on zantac daily  Call if bloody stools, persistent diarrhea, not voiding regularly, unable to take oral fluids, vomiting, high fever, severe weakness, abdominal pain or failure to improve in 2-3 days.  -     CBC with Differential/Platelet -     BASIC METABOLIC PANEL WITH GFR -     Hepatic function panel -     TSH -     Clostridium difficile Toxin B, Qualitative, Real-Time PCR([facility]); Future -     Fecal Fat, Qualitative; Future  Gross hematuria Was treated with keflex Had gross hematuria today at the office Will get CT AB and possible referral to urology pending labs/CT -     Urinalysis, Routine w reflex microscopic -     Urine Culture -     CT Abdomen Pelvis Wo Contrast; Future     Future Appointments  Date Time Provider [place]  09/02/2017  3:30 PM Liane Comber, NP [facility] None  09/04/2017  2:45 PM Gatha Mayer, MD [facility]  09/22/2017  2:30 PM Sherran Needs, NP [facility] None  02/17/2018 10:00 AM Vicie Mutters, PA-C [facility] None

## 2017-08-27 NOTE — Patient Instructions (Signed)
Continue bland diet Push fluids  Please go to the ER if you have any severe AB pain, unable to hold down food/water, blood in stool or vomit, chest pain, shortness of breath, or any worsening symptoms.    Food Choices to Help Relieve Diarrhea, Adult When you have diarrhea, the foods you eat and your eating habits are very important. Choosing the right foods and drinks can help:  Relieve diarrhea.  Replace lost fluids and nutrients.  Prevent dehydration.  What general guidelines should I follow? Relieving diarrhea  Choose foods with less than 2 g or .07 oz. of fiber per serving.  Limit fats to less than 8 tsp (38 g or 1.34 oz.) a day.  Avoid the following: ? Foods and beverages sweetened with high-fructose corn syrup, honey, or sugar alcohols such as xylitol, sorbitol, and mannitol. ? Foods that contain a lot of fat or sugar. ? Fried, greasy, or spicy foods. ? High-fiber grains, breads, and cereals. ? Raw fruits and vegetables.  Eat foods that are rich in probiotics. These foods include dairy products such as yogurt and fermented milk products. They help increase healthy bacteria in the stomach and intestines (gastrointestinal tract, or GI tract).  If you have lactose intolerance, avoid dairy products. These may make your diarrhea worse.  Take medicine to help stop diarrhea (antidiarrheal medicine) only as told by your health care provider. Replacing nutrients  Eat small meals or snacks every 3-4 hours.  Eat bland foods, such as white rice, toast, or baked potato, until your diarrhea starts to get better. Gradually reintroduce nutrient-rich foods as tolerated or as told by your health care provider. This includes: ? Well-cooked protein foods. ? Peeled, seeded, and soft-cooked fruits and vegetables. ? Low-fat dairy products.  Take vitamin and mineral supplements as told by your health care provider. Preventing dehydration   Start by sipping water or a special solution  to prevent dehydration (oral rehydration solution, ORS). Urine that is clear or pale yellow means that you are getting enough fluid.  Try to drink at least 8-10 cups of fluid each day to help replace lost fluids.  You may add other liquids in addition to water, such as clear juice or decaffeinated sports drinks, as tolerated or as told by your health care provider.  Avoid drinks with caffeine, such as coffee, tea, or soft drinks.  Avoid alcohol. What foods are recommended? The items listed may not be a complete list. Talk with your health care provider about what dietary choices are best for you. Grains White rice. White, [place], or pita breads (fresh or toasted), including plain rolls, buns, or bagels. White pasta. Saltine, soda, or graham crackers. Pretzels. Low-fiber cereal. Cooked cereals made with water (such as cornmeal, farina, or cream cereals). Plain muffins. Matzo. Melba toast. Zwieback. Vegetables Potatoes (without the skin). Most well-cooked and canned vegetables without skins or seeds. Tender lettuce. Fruits Apple sauce. Fruits canned in juice. Cooked apricots, cherries, grapefruit, peaches, pears, or plums. Fresh bananas and cantaloupe. Meats and other protein foods Baked or boiled chicken. Eggs. Tofu. Fish. Seafood. Smooth nut butters. Ground or well-cooked tender beef, ham, veal, lamb, pork, or poultry. Dairy Plain yogurt, kefir, and unsweetened liquid yogurt. Lactose-free milk, buttermilk, skim milk, or soy milk. Low-fat or nonfat hard cheese. Beverages Water. Low-calorie sports drinks. Fruit juices without pulp. Strained tomato and vegetable juices. Decaffeinated teas. Sugar-free beverages not sweetened with sugar alcohols. Oral rehydration solutions, if approved by your health care provider. Seasoning and other foods Bouillon,  broth, or soups made from recommended foods. What foods are not recommended? The items listed may not be a complete list. Talk with your health  care provider about what dietary choices are best for you. Grains Whole grain, whole wheat, bran, or rye breads, rolls, pastas, and crackers. Wild or brown rice. Whole grain or bran cereals. Barley. Oats and oatmeal. Corn tortillas or taco shells. Granola. Popcorn. Vegetables Raw vegetables. Fried vegetables. Cabbage, broccoli, Brussels sprouts, artichokes, baked beans, beet greens, corn, kale, legumes, peas, sweet potatoes, and yams. Potato skins. Cooked spinach and cabbage. Fruits Dried fruit, including raisins and dates. Raw fruits. Stewed or dried prunes. Canned fruits with syrup. Meat and other protein foods Fried or fatty meats. Deli meats. Chunky nut butters. Nuts and seeds. Beans and lentils. Berniece Salines. Hot dogs. Sausage. Dairy High-fat cheeses. Whole milk, chocolate milk, and beverages made with milk, such as milk shakes. Half-and-half. Cream. sour cream. Ice cream. Beverages Caffeinated beverages (such as coffee, tea, soda, or energy drinks). Alcoholic beverages. Fruit juices with pulp. Prune juice. Soft drinks sweetened with high-fructose corn syrup or sugar alcohols. High-calorie sports drinks. Fats and oils Butter. Cream sauces. Margarine. Salad oils. Plain salad dressings. Olives. Avocados. Mayonnaise. Sweets and desserts Sweet rolls, doughnuts, and sweet breads. Sugar-free desserts sweetened with sugar alcohols such as xylitol and sorbitol. Seasoning and other foods Honey. Hot sauce. Chili powder. Gravy. Cream-based or milk-based soups. Pancakes and waffles. Summary  When you have diarrhea, the foods you eat and your eating habits are very important.  Make sure you get at least 8-10 cups of fluid each day, or enough to keep your urine clear or pale yellow.  Eat bland foods and gradually reintroduce healthy, nutrient-rich foods as tolerated, or as told by your health care provider.  Avoid high-fiber, fried, greasy, or spicy foods. This information is not intended to replace  advice given to you by your health care provider. Make sure you discuss any questions you have with your health care provider. Document Released: 08/16/2003 Document Revised: 05/23/2016 Document Reviewed: 05/23/2016 Elsevier Interactive Patient Education  [employer].

## 2017-08-28 ENCOUNTER — Other Ambulatory Visit: Payer: Self-pay | Admitting: Physician Assistant

## 2017-08-28 DIAGNOSIS — A09 Infectious gastroenteritis and colitis, unspecified: Secondary | ICD-10-CM | POA: Diagnosis not present

## 2017-08-28 LAB — BASIC METABOLIC PANEL WITH GFR
BUN / CREAT RATIO: 16 (calc) (ref 6–22)
BUN: 16 mg/dL (ref 7–25)
CO2: 27 mmol/L (ref 20–32)
CREATININE: 1.03 mg/dL — AB (ref 0.60–0.93)
Calcium: 9.5 mg/dL (ref 8.6–10.4)
Chloride: 109 mmol/L (ref 98–110)
GFR, EST AFRICAN AMERICAN: 62 mL/min/{1.73_m2} (ref >=60)
GFR, Est Non African American: 54 mL/min/{1.73_m2} — ABNORMAL LOW (ref >=60)
Glucose, Bld: 116 mg/dL — ABNORMAL HIGH (ref 65–99)
Potassium: 4.7 mmol/L (ref 3.5–5.3)
Sodium: 141 mmol/L (ref 135–146)

## 2017-08-28 LAB — CBC WITH DIFFERENTIAL/PLATELET
BASOS PCT: 1.2 %
Basophils Absolute: 67 cells/uL (ref 0–200)
EOS ABS: 151 {cells}/uL (ref 15–500)
Eosinophils Relative: 2.7 %
HCT: 42.3 % (ref 35.0–45.0)
HEMOGLOBIN: 14.4 g/dL (ref 11.7–15.5)
Lymphs Abs: 918 cells/uL (ref 850–3900)
MCH: 31.8 pg (ref 27.0–33.0)
MCHC: 34 g/dL (ref 32.0–36.0)
MCV: 93.4 fL (ref 80.0–100.0)
MPV: 11.1 fL (ref 7.5–12.5)
Monocytes Relative: 11.9 %
NEUTROS ABS: 3797 {cells}/uL (ref 1500–7800)
Neutrophils Relative %: 67.8 %
Platelets: 381 10*3/uL (ref 140–400)
RBC: 4.53 10*6/uL (ref 3.80–5.10)
RDW: 14.4 % (ref 11.0–15.0)
TOTAL LYMPHOCYTE: 16.4 %
WBC mixed population: 666 cells/uL (ref 200–950)
WBC: 5.6 10*3/uL (ref 3.8–10.8)

## 2017-08-28 LAB — HEPATIC FUNCTION PANEL
AG RATIO: 1 (calc) (ref 1.0–2.5)
ALT: 28 U/L (ref 6–29)
AST: 58 U/L — ABNORMAL HIGH (ref 10–35)
Albumin: 2.9 g/dL — ABNORMAL LOW (ref 3.6–5.1)
Alkaline phosphatase (APISO): 79 U/L (ref 33–130)
BILIRUBIN DIRECT: 0.4 mg/dL — AB (ref 0.0–0.2)
BILIRUBIN INDIRECT: 1.3 mg/dL — AB (ref 0.2–1.2)
GLOBULIN: 3 g/dL (ref 1.9–3.7)
Total Bilirubin: 1.7 mg/dL — ABNORMAL HIGH (ref 0.2–1.2)
Total Protein: 5.9 g/dL — ABNORMAL LOW (ref 6.1–8.1)

## 2017-08-28 LAB — TSH: TSH: 13.78 mIU/L — ABNORMAL HIGH (ref 0.40–4.50)

## 2017-08-28 MED ORDER — CEFUROXIME AXETIL 250 MG PO TABS: 250.0000 mg | ORAL_TABLET | Freq: Two times a day (BID) | ORAL | 0 refills | Status: DC

## 2017-08-28 NOTE — Addendum Note (Signed)
Addended by: Dolores Hoose on: 08/28/2017 10:14 AM   Modules accepted: Orders

## 2017-08-29 LAB — URINALYSIS, ROUTINE W REFLEX MICROSCOPIC
Bilirubin Urine: NEGATIVE
Glucose, UA: NEGATIVE
Ketones, ur: NEGATIVE
Nitrite: POSITIVE — AB
SQUAMOUS EPITHELIAL / LPF: NONE SEEN /HPF (ref <=5)
Specific Gravity, Urine: 1.02 (ref 1.001–1.03)
pH: 5.5 (ref 5.0–8.0)

## 2017-08-29 LAB — URINE CULTURE
MICRO NUMBER:: 90358283
SPECIMEN QUALITY:: ADEQUATE

## 2017-09-01 LAB — CLOSTRIDIUM DIFFICILE TOXIN B, QUALITATIVE, REAL-TIME PCR: Toxigenic C. Difficile by PCR: NOT DETECTED

## 2017-09-01 LAB — FECAL FAT, QUALITATIVE: FECAL FAT, QUALITATIVE: NORMAL

## 2017-09-01 NOTE — Progress Notes (Signed)
Medicare wellness and OV  Assessment and Plan: Gross hematuria Getting CT AB with blood and protein in urine to try to see kidney versus bladder Will refer to nephrology versus urology pending CT AB Patient has had a decrease in her serum albumin- does have cirrhosis, has follow up GI Friday.  Has been treated with keflex and ceftin with 1 more episode of hematuria after treatment Start eliquis Jan/Feb- will send a note to Afib clinic to see if they would like to continue eliquis while we are working up the patient versus stopping Repeat labs today -     Urinalysis, Routine w reflex microscopic -     Urine Culture  Anemia, unspecified type -     Iron,Total/Total Iron Binding Cap  Abnormality of albumin Getting CT AB with blood and protein in urine to try to see kidney versus bladder Will refer to nephrology versus urology pending CT AB Patient has had a decrease in her serum albumin- does have cirrhosis, has follow up GI Friday.  Has been treated with keflex and ceftin with 1 more episode of hematuria after treatment -     Hepatic function panel  PORTAL HYPERTENSION Follow up GI -     Hepatic function panel  Esophageal varices without bleeding, unspecified esophageal varices type ([place]) -     Hepatic function panel - follow up GI , continue BB  Paroxysmal atrial fibrillation ([place]) Started on eliquis in JanFeb with 3 separate issues of hematuria- reheck labs today, will message to see if we want to continue it or stop during evaluation. continue follow up cardio  Essential hypertension - continue medications, DASH diet, exercise and monitor at home. Call if greater than 130/80.  -     CBC with Differential/Platelet -     BASIC METABOLIC PANEL WITH GFR -     Hepatic function panel -     TSH  Other cirrhosis of liver (HCC) Weight loss advised, follow up GI -     Hepatic function panel  Obstructive sleep apnea Sleep apnea- she is NOT on CPAP, AHI only 9.3, on O2 at night but  has not been wearing it  Hypothyroidism, unspecified type Hypothyroidism-check TSH level, continue medications the same, reminded to take on an empty stomach 30-7mins before food.  -     TSH  Medication management -     Magnesium  Vitamin D deficiency -     VITAMIN D 25 Hydroxy (Vit-D Deficiency, Fractures)  History of small bowel obstruction WNL now, monitor, normal BS and normal AB exam  History of colonic polyps Will scheudle follow up- has follow up Friday  Mixed hyperlipidemia -continue medications, check lipids, decrease fatty foods, increase activity.  -     Lipid panel  Chronic combined systolic and diastolic CHF (congestive heart failure) (HCC) Weight is up, no increase in edema, monitor weight at home, fluid restrict Follow up cardiology  Reactive airway disease, mild intermittent, uncomplicated Monitor  Encounter for Medicare annual wellness exam 1 year  BMI 36.0-36.9,adult - follow up 3 months for progress monitoring - increase veggies, decrease carbs - long discussion about weight loss, diet, and exercise  Discussed med's effects and SE's. Screening labs and tests as requested with regular follow-up as recommended. Future Appointments  Date Time Provider [place]  09/04/2017  2:45 PM Gatha Mayer, MD [facility]  09/09/2017  1:30 PM [facility] 1 [facility]  09/22/2017  2:30 PM Sherran Needs, NP [facility] None  12/03/2017  3:45 PM Unk Pinto, MD [facility] None  02/17/2018 10:00 AM Vicie Mutters, PA-C [facility] None     Plan:   During the course of the visit the patient was educated and counseled about appropriate screening and preventive services including:    Pneumococcal vaccine   Prevnar 13  Influenza vaccine  Td vaccine  Screening electrocardiogram  Bone densitometry screening  Colorectal cancer screening  Diabetes screening  Glaucoma screening  Nutrition counseling   Advanced directives:  requested   HPI  77 y.o. female  presents for a medicare wellness and follow up.   Her blood pressure has been controlled at home for 130/70, today their BP is BP: 128/84.  She does workout. She denies chest pain, shortness of breath, dizziness.    Had recent follow up with cardiology for her Afib, CHF.   She has history of cirrhosis, follows GI, has esophageal varices, STARTED ON ELIQUIS JAN/FEB.   Most recently she has been in the office with diarrhea and hematuria. She had a negative fecal fat and negative Cdiff, she continues to have diarrhea with food.  No mucus, blood in stool, no black stool, no fever chills, no AB pain. No recent sick contacts. She is on thyroid med. She is not on probiotic. Follows up with Dr. Carlean Purl this Friday.   She has had gross hematuria with culture Ecoli + UTI x 2, treated Keflex and Ceftin, and has had gross proteinuria with decreased albumin. She is getting a CT AB on Wednesday. She had an episode of hematuria this AM. She has unchanged swelling in her legs, wear compression socks, no swelling in AB.  Lab Results  Component Value Date   ALT 28 08/27/2017   AST 58 (H) 08/27/2017   ALKPHOS 75 07/06/2017   BILITOT 1.7 (H) 08/27/2017   Lab Results  Component Value Date   HGBUR 2+ (A) 08/27/2017   PROTEINUR 1+ (A) 08/27/2017   BACTERIA MANY (A) 08/27/2017   Lab Results  Component Value Date   PROT 5.9 (L) 08/27/2017   MSALB 2.9 (L) 08/27/2017   She is not on cholesterol medication and denies myalgias. Her cholesterol is at goal. The cholesterol last visit was:  Lab Results  Component Value Date   CHOL 142 05/20/2017   HDL 37 (L) 05/20/2017   LDLCALC 89 05/20/2017   TRIG 73 05/20/2017   CHOLHDL 3.8 05/20/2017  .  She has been working on diet and exercise for prediabetes.  Last A1C in the office was:  Lab Results  Component Value Date   HGBA1C 5.0 05/20/2017   Patient is on Vitamin D supplement, on 2000 a day.  Lab Results  Component Value  Date   VD25OH 37 05/20/2017     She is on thyroid medication. Her medication was not changed last visit, she was suppose to be on 1.5 pills 3 days a week and 1 pill daily but she has just been on 1 pill daily.     Lab Results  Component Value Date   TSH 13.78 (H) 08/27/2017  .  BMI is Body mass index is 36.91 kg/m., she is working on diet and exercise. Wt Readings from Last 3 Encounters:  09/02/17 221 lb 12.8 oz (100.6 kg)  08/27/17 219 lb 9.6 oz (99.6 kg)  08/26/17 218 lb (98.9 kg)     Current Medications:  Current Outpatient Medications on File Prior to Visit  Medication Sig Dispense Refill  . amLODipine (NORVASC) 2.5 MG tablet TAKE ONE TABLET EVERY  DAY 30 tablet 2  . apixaban (ELIQUIS) 5 MG TABS tablet Take 1 tablet (5 mg total) by mouth 2 (two) times daily. 60 tablet 3  . Cholecalciferol (VITAMIN D-3) 1000 UNITS CAPS Take 1,000 Units by mouth 2 (two) times daily.     Marland Kitchen levothyroxine (SYNTHROID, LEVOTHROID) 112 MCG tablet TAKE 1 TABLET BY MOUTH IN THE MORNING 90 tablet 0  . metoprolol succinate (TOPROL-XL) 100 MG 24 hr tablet Take 1.5 tablets (150mg ) by mouth daily 135 tablet 1  . ranitidine (ZANTAC) 300 MG tablet Take 1 tablet (300 mg total) at bedtime by mouth. 30 tablet 1   No current facility-administered medications on file prior to visit.     Health Maintenance:   Immunization History  Administered Date(s) Administered  . DT 12/21/2014  . Influenza Whole 03/09/2012  . Influenza,inj,Quad PF,6+ Mos 02/09/2014  . Pneumococcal Conjugate-13 12/19/2013  . Pneumococcal Polysaccharide-23 06/09/2004, 12/26/2015  . Td 06/09/2004  . Zoster 12/15/2012    Tetanus: 2016 Pneumovax: 2017 Prevnar 2015 Flu vaccine: declines had palpitations last time Zostavax: 2014  Pap: Hysterectomy MGM: 11/2016  DEXA: 2018 osteopenia -1.6 Colonoscopy: 2013, Due 2018 will make OV EGD: 2013 [place] AB 12/2013 Echo 2011  Gershon Crane follows up next week  Patient Care Team: Unk Pinto, MD  as PCP - General (Internal Medicine) Rutherford Guys, MD as Consulting Physician (Ophthalmology) Gatha Mayer, MD as Consulting Physician (Gastroenterology) Deterding, Jeneen Rinks, MD as Consulting Physician (Nephrology) Jettie Booze, MD as Consulting Physician (Cardiology) Minus Breeding, MD as Consulting Physician (Cardiology)   Medical History:  Past Medical History:  Diagnosis Date  . Anemia   . Blood transfusion without reported diagnosis   . Cirrhosis ([place])   . Degenerative disk disease   . Diverticulosis   . Hemorrhoids   . Hypertension   . Kidney stones   . Obesity   . Paroxysmal atrial fibrillation (HCC)   . Partial small bowel obstruction ([place])   . Personal history of colonic polyps 03/25/2012   8 mm rectal adenoma 03/2012  . Portal hypertensive gastropathy ([place])   . Shingles   . Thyroid disease   . Varices, esophageal ([place])   . Zoster     Allergies Allergies  Allergen Reactions  . Ciprofloxacin     Swelling  . Robaxin [Methocarbamol]     palpiations  . Sulfonamide Derivatives     See little dots, skin feels like its burning  . Verapamil     palpitations    SURGICAL HISTORY She  has a past surgical history that includes Esophagogastroduodenoscopy (multiple); Colonoscopy; Abdominal hysterectomy; Cholecystectomy (2000); Appendectomy (1991); Colon surgery; and Colon surgery. FAMILY HISTORY Her family history includes Heart attack in her father; Heart failure in her mother; Hypertension in her mother and sister. SOCIAL HISTORY She  reports that she has never smoked. She has never used smokeless tobacco. She reports that she does not drink alcohol or use drugs.  MEDICARE WELLNESS OBJECTIVES: Physical activity: Current Exercise Habits: The patient does not participate in regular exercise at present Cardiac risk factors: Cardiac Risk Factors include: sedentary lifestyle;obesity (BMI >30kg/m2);hypertension;dyslipidemia;advanced age (>11men, >51 women);family  history of premature cardiovascular disease Depression/mood screen:   Depression screen Brittney [facility] 2/9 09/02/2017  Decreased Interest 0  Down, Depressed, Hopeless 0  PHQ - 2 Score 0    ADLs:  In your present state of health, do you have any difficulty performing the following activities: 09/02/2017 05/20/2017  Hearing? N N  Vision? N N  Difficulty concentrating or making  decisions? N N  Walking or climbing stairs? N N  Dressing or bathing? N N  Doing errands, shopping? N N  Some recent data might be hidden     Cognitive Testing  Alert? Yes  Normal Appearance?Yes  Oriented to person? Yes  Place? Yes   Time? Yes  Recall of three objects?  Yes  Can perform simple calculations? Yes  Displays appropriate judgment?Yes  Can read the correct time from a watch face?Yes  EOL planning: Does Patient Have a Medical Advance Directive?: Yes Type of Advance Directive: Healthcare Power of Attorney, Living will [place] in Chart?: No - copy requested    Review of Systems: Review of Systems  Constitutional: Negative for chills, diaphoresis, fever, malaise/fatigue and weight loss.  HENT: Negative for congestion, ear pain and sore throat.   Eyes: Negative.   Respiratory: Negative for cough, shortness of breath and wheezing.   Cardiovascular: Positive for leg swelling. Negative for chest pain and palpitations.  Gastrointestinal: Positive for diarrhea. Negative for abdominal pain, blood in stool, constipation, heartburn, melena, nausea and vomiting.  Genitourinary: Positive for hematuria. Negative for dysuria, flank pain, frequency and urgency.  Musculoskeletal: Negative.   Skin: Negative for itching and rash.  Neurological: Negative for dizziness, sensory change, loss of consciousness and headaches.  Psychiatric/Behavioral: Negative for depression. The patient is not nervous/anxious and does not have insomnia.     Physical Exam: Estimated body mass index is 36.91 kg/m  as calculated from the following:   Height as of this encounter: 5\' 5"  (1.651 m).   Weight as of this encounter: 221 lb 12.8 oz (100.6 kg). BP 128/84   Pulse 90   Temp 97.6 F (36.4 C)   Resp 14   Ht 5\' 5"  (1.651 m)   Wt 221 lb 12.8 oz (100.6 kg)   SpO2 97%   BMI 36.91 kg/m   General Appearance: Well nourished well developed, in no apparent distress.  Eyes: PERRLA, EOMs, conjunctiva no swelling or erythema ENT/Mouth: Ear canals normal without obstruction, swelling, erythema, or discharge.  TMs normal bilaterally with no erythema, + bilateral effusion.  Oropharynx moist and clear with no exudate, erythema, or swelling.   Neck: Supple, thyroid normal. No bruits.  No cervical adenopathy Respiratory: Respiratory effort normal, Breath sounds clear A&P without wheeze, rhonchi, rales.   Cardio: RRR with 3/6 systolic murmurs, rubs or gallops. Brisk peripheral pulses with 2+ edema.  Chest: symmetric, with normal excursions Breasts: defer Abdomen: Soft, nontender, no guarding, rebound, hernias, masses, or organomegaly.  Lymphatics: Non tender without lymphadenopathy.  Genitourinary: defer Musculoskeletal: Full ROM all peripheral extremities,5/5 strength, and normal gait.  Skin:  Warm, dry without  lesions, ecchymosis. Neuro: Awake and oriented X 3, Cranial nerves intact, reflexes equal bilaterally. Normal muscle tone, no cerebellar symptoms. Sensation intact.  Psych:  normal affect, Insight and Judgment appropriate.   EKG: WNL no changes.  Over 30 minutes of exam, counseling, chart review and critical decision making was performed  Medicare Attestation I have personally reviewed: The patient's medical and social history Their use of alcohol, tobacco or illicit drugs Their current medications and supplements The patient's functional ability including ADLs,fall risks, home safety risks, cognitive, and hearing and visual impairment Diet and physical activities Evidence for depression or  mood disorders  The patient's weight, height, BMI, and visual acuity have been recorded in the chart.  I have made referrals, counseling, and provided education to the patient based on review of the above  and I have provided the patient with a written personalized care plan for preventive services.    Vicie Mutters 3:57 PM [facility]

## 2017-09-02 ENCOUNTER — Encounter: Payer: Self-pay | Admitting: Physician Assistant

## 2017-09-02 ENCOUNTER — Ambulatory Visit (INDEPENDENT_AMBULATORY_CARE_PROVIDER_SITE_OTHER): Payer: PPO | Admitting: Physician Assistant

## 2017-09-02 VITALS — BP 128/84 | HR 90 | Temp 97.6°F | Resp 14 | Ht 65.0 in | Wt 221.8 lb

## 2017-09-02 DIAGNOSIS — Z79899 Other long term (current) drug therapy: Secondary | ICD-10-CM | POA: Diagnosis not present

## 2017-09-02 DIAGNOSIS — Z86010 Personal history of colonic polyps: Secondary | ICD-10-CM

## 2017-09-02 DIAGNOSIS — R6889 Other general symptoms and signs: Secondary | ICD-10-CM

## 2017-09-02 DIAGNOSIS — I420 Dilated cardiomyopathy: Secondary | ICD-10-CM

## 2017-09-02 DIAGNOSIS — I8500 Esophageal varices without bleeding: Secondary | ICD-10-CM

## 2017-09-02 DIAGNOSIS — K766 Portal hypertension: Secondary | ICD-10-CM | POA: Diagnosis not present

## 2017-09-02 DIAGNOSIS — Z0000 Encounter for general adult medical examination without abnormal findings: Secondary | ICD-10-CM

## 2017-09-02 DIAGNOSIS — R770 Abnormality of albumin: Secondary | ICD-10-CM

## 2017-09-02 DIAGNOSIS — J4520 Mild intermittent asthma, uncomplicated: Secondary | ICD-10-CM | POA: Diagnosis not present

## 2017-09-02 DIAGNOSIS — G4733 Obstructive sleep apnea (adult) (pediatric): Secondary | ICD-10-CM

## 2017-09-02 DIAGNOSIS — R310 Gross hematuria: Secondary | ICD-10-CM

## 2017-09-02 DIAGNOSIS — E782 Mixed hyperlipidemia: Secondary | ICD-10-CM

## 2017-09-02 DIAGNOSIS — Z8719 Personal history of other diseases of the digestive system: Secondary | ICD-10-CM | POA: Diagnosis not present

## 2017-09-02 DIAGNOSIS — I4819 Other persistent atrial fibrillation: Secondary | ICD-10-CM

## 2017-09-02 DIAGNOSIS — Z0001 Encounter for general adult medical examination with abnormal findings: Secondary | ICD-10-CM | POA: Diagnosis not present

## 2017-09-02 DIAGNOSIS — K7469 Other cirrhosis of liver: Secondary | ICD-10-CM | POA: Diagnosis not present

## 2017-09-02 DIAGNOSIS — I5042 Chronic combined systolic (congestive) and diastolic (congestive) heart failure: Secondary | ICD-10-CM

## 2017-09-02 DIAGNOSIS — I481 Persistent atrial fibrillation: Secondary | ICD-10-CM

## 2017-09-02 DIAGNOSIS — D649 Anemia, unspecified: Secondary | ICD-10-CM

## 2017-09-02 DIAGNOSIS — Z6836 Body mass index (BMI) 36.0-36.9, adult: Secondary | ICD-10-CM

## 2017-09-02 DIAGNOSIS — E039 Hypothyroidism, unspecified: Secondary | ICD-10-CM

## 2017-09-02 DIAGNOSIS — I10 Essential (primary) hypertension: Secondary | ICD-10-CM

## 2017-09-02 DIAGNOSIS — E559 Vitamin D deficiency, unspecified: Secondary | ICD-10-CM

## 2017-09-02 NOTE — Patient Instructions (Addendum)
Increase thyroid to 1.5 pills Mon, Wed, Friday 1 pill other days Recheck 1 month   Hypothyroidism Hypothyroidism is a disorder of the thyroid. The thyroid is a large gland that is located in the lower front of the neck. The thyroid releases hormones that control how the body works. With hypothyroidism, the thyroid does not make enough of these hormones. What are the causes? Causes of hypothyroidism may include:  Viral infections.  Pregnancy.  Your own defense system (immune system) attacking your thyroid.  Certain medicines.  Birth defects.  Past radiation treatments to your head or neck.  Past treatment with radioactive iodine.  Past surgical removal of part or all of your thyroid.  Problems with the gland that is located in the center of your brain (pituitary).  What are the signs or symptoms? Signs and symptoms of hypothyroidism may include:  Feeling as though you have no energy (lethargy).  Inability to tolerate cold.  Weight gain that is not explained by a change in diet or exercise habits.  Dry skin.  Coarse hair.  Menstrual irregularity.  Slowing of thought processes.  Constipation.  Sadness or depression.  How is this diagnosed? Your health care provider may diagnose hypothyroidism with blood tests and ultrasound tests. How is this treated? Hypothyroidism is treated with medicine that replaces the hormones that your body does not make. After you begin treatment, it may take several weeks for symptoms to go away. Follow these instructions at home:  Take medicines only as directed by your health care provider.  If you start taking any new medicines, tell your health care provider.  Keep all follow-up visits as directed by your health care provider. This is important. As your condition improves, your dosage needs may change. You will need to have blood tests regularly so that your health care provider can watch your condition. Contact a health care  provider if:  Your symptoms do not get better with treatment.  You are taking thyroid replacement medicine and: ? You sweat excessively. ? You have tremors. ? You feel anxious. ? You lose weight rapidly. ? You cannot tolerate heat. ? You have emotional swings. ? You have diarrhea. ? You feel weak. Get help right away if:  You develop chest pain.  You develop an irregular heartbeat.  You develop a rapid heartbeat. This information is not intended to replace advice given to you by your health care provider. Make sure you discuss any questions you have with your health care provider. Document Released: 05/26/2005 Document Revised: 11/01/2015 Document Reviewed: 10/11/2013 Elsevier Interactive Patient Education  2018 [place] following things EVERYDAY: 1) Weigh yourself in the morning before breakfast or at the same time every day. Write it down and keep it in a log. 2) Take your medicines as prescribed 3) Eat low salt foods-Limit salt (sodium) to 2000 mg per day. Best thing to do is avoid processed foods.   4) Stay as active as you can everyday 5) Limit all fluids for the day to less than 1.5 liters  Call your doctor if:  Anytime you have any of the following symptoms:  1) 2 pound weight gain in 24 hours or 5 pounds in 1 week  2) shortness of breath, with or without a dry hacking cough  3) swelling in the hands, LEGs, feet or stomach  4) if you have to sleep on extra pillows at night in order to breathe. 5) after laying down at night for 20-30 mins,  you wake up short of breath.   These can all be signs of fluid overload.    Heart Failure Heart failure means your heart has trouble pumping blood. This makes it hard for your body to work well. Heart failure is usually a long-term (chronic) condition. You must take good care of yourself and follow your doctor's treatment plan. Follow these instructions at home:  Take your heart medicine as told by your  doctor. ? Do not stop taking medicine unless your doctor tells you to. ? Do not skip any dose of medicine. ? Refill your medicines before they run out. ? Take other medicines only as told by your doctor or pharmacist.  Stay active if told by your doctor. The elderly and people with severe heart failure should talk with a doctor about physical activity.  Eat heart-healthy foods. Choose foods that are without trans fat and are low in saturated fat, cholesterol, and salt (sodium). This includes fresh or frozen fruits and vegetables, fish, lean meats, fat-free or low-fat dairy foods, whole grains, and high-fiber foods. Lentils and dried peas and beans (legumes) are also good choices.  Limit salt if told by your doctor.  Cook in a healthy way. Roast, grill, broil, bake, poach, steam, or stir-fry foods.  Limit fluids as told by your doctor.  Weigh yourself every morning. Do this after you pee (urinate) and before you eat breakfast. Write down your weight to give to your doctor.  Take your blood pressure and write it down if your doctor tells you to.  Ask your doctor how to check your pulse. Check your pulse as told.  Lose weight if told by your doctor.  Stop smoking or chewing tobacco. Do not use gum or patches that help you quit without your doctor's approval.  Schedule and go to doctor visits as told.  Nonpregnant women should have no more than 1 drink a day. Men should have no more than 2 drinks a day. Talk to your doctor about drinking alcohol.  Stop illegal drug use.  Stay current with shots (immunizations).  Manage your health conditions as told by your doctor.  Learn to manage your stress.  Rest when you are tired.  If it is really hot outside: ? Avoid intense activities. ? Use air conditioning or fans, or get in a cooler place. ? Avoid caffeine and alcohol. ? Wear loose-fitting, lightweight, and light-colored clothing.  If it is really cold outside: ? Avoid intense  activities. ? Layer your clothing. ? Wear mittens or gloves, a hat, and a scarf when going outside. ? Avoid alcohol.  Learn about heart failure and get support as needed.  Get help to maintain or improve your quality of life and your ability to care for yourself as needed. Contact a doctor if:  You gain weight quickly.  You are more short of breath than usual.  You cannot do your normal activities.  You tire easily.  You cough more than normal, especially with activity.  You have any or more puffiness (swelling) in areas such as your hands, feet, ankles, or belly (abdomen).  You cannot sleep because it is hard to breathe.  You feel like your heart is beating fast (palpitations).  You get dizzy or light-headed when you stand up. Get help right away if:  You have trouble breathing.  There is a change in mental status, such as becoming less alert or not being able to focus.  You have chest pain or discomfort.  You  faint. This information is not intended to replace advice given to you by your health care provider. Make sure you discuss any questions you have with your health care provider. Document Released: 03/04/2008 Document Revised: 11/01/2015 Document Reviewed: 07/12/2012 Elsevier Interactive Patient Education  2017 [employer].    .Hematuria, Adult Hematuria is blood in your urine. It can be caused by a bladder infection, kidney infection, prostate infection, kidney stone, or cancer of your urinary tract. Infections can usually be treated with medicine, and a kidney stone usually will pass through your urine. If neither of these is the cause of your hematuria, further workup to find out the reason may be needed. It is very important that you tell your health care provider about any blood you see in your urine, even if the blood stops without treatment or happens without causing pain. Blood in your urine that happens and then stops and then happens again can be a  symptom of a very serious condition. Also, pain is not a symptom in the initial stages of many urinary cancers. Follow these instructions at home:  Drink lots of fluid, 3-4 quarts a day. If you have been diagnosed with an infection, cranberry juice is especially recommended, in addition to large amounts of water.  Avoid caffeine, tea, and carbonated beverages because they tend to irritate the bladder.  Avoid alcohol because it may irritate the prostate.  Take all medicines as directed by your health care provider.  If you were prescribed an antibiotic medicine, finish it all even if you start to feel better.  If you have been diagnosed with a kidney stone, follow your health care provider's instructions regarding straining your urine to catch the stone.  Empty your bladder often. Avoid holding urine for long periods of time.  After a bowel movement, women should cleanse front to back. Use each tissue only once.  Empty your bladder before and after sexual intercourse if you are a female. Contact a health care provider if:  You develop back pain.  You have a fever.  You have a feeling of sickness in your stomach (nausea) or vomiting.  Your symptoms are not better in 3 days. Return sooner if you are getting worse. Get help right away if:  You develop severe vomiting and are unable to keep the medicine down.  You develop severe back or abdominal pain despite taking your medicines.  You begin passing a large amount of blood or clots in your urine.  You feel extremely weak or faint, or you pass out. This information is not intended to replace advice given to you by your health care provider. Make sure you discuss any questions you have with your health care provider. Document Released: 05/26/2005 Document Revised: 11/01/2015 Document Reviewed: 01/24/2013 Elsevier Interactive Patient Education  2017 [employer].

## 2017-09-03 ENCOUNTER — Other Ambulatory Visit: Payer: Self-pay | Admitting: Physician Assistant

## 2017-09-03 DIAGNOSIS — R310 Gross hematuria: Secondary | ICD-10-CM

## 2017-09-04 ENCOUNTER — Other Ambulatory Visit: Payer: Self-pay | Admitting: Physician Assistant

## 2017-09-04 ENCOUNTER — Encounter: Payer: Self-pay | Admitting: Internal Medicine

## 2017-09-04 ENCOUNTER — Telehealth: Payer: Self-pay

## 2017-09-04 ENCOUNTER — Ambulatory Visit: Payer: PPO | Admitting: Internal Medicine

## 2017-09-04 VITALS — BP 110/74 | HR 77 | Ht 65.0 in | Wt 222.0 lb

## 2017-09-04 DIAGNOSIS — Z7901 Long term (current) use of anticoagulants: Secondary | ICD-10-CM | POA: Diagnosis not present

## 2017-09-04 DIAGNOSIS — K529 Noninfective gastroenteritis and colitis, unspecified: Secondary | ICD-10-CM

## 2017-09-04 DIAGNOSIS — Z86010 Personal history of colonic polyps: Secondary | ICD-10-CM

## 2017-09-04 DIAGNOSIS — K7469 Other cirrhosis of liver: Secondary | ICD-10-CM | POA: Diagnosis not present

## 2017-09-04 DIAGNOSIS — I8510 Secondary esophageal varices without bleeding: Secondary | ICD-10-CM | POA: Diagnosis not present

## 2017-09-04 LAB — CBC WITH DIFFERENTIAL/PLATELET
BASOS ABS: 72 {cells}/uL (ref 0–200)
Basophils Relative: 1.3 %
EOS ABS: 110 {cells}/uL (ref 15–500)
Eosinophils Relative: 2 %
HCT: 40.6 % (ref 35.0–45.0)
HEMOGLOBIN: 14 g/dL (ref 11.7–15.5)
Lymphs Abs: 935 cells/uL (ref 850–3900)
MCH: 32.3 pg (ref 27.0–33.0)
MCHC: 34.5 g/dL (ref 32.0–36.0)
MCV: 93.8 fL (ref 80.0–100.0)
MONOS PCT: 15.9 %
MPV: 11.1 fL (ref 7.5–12.5)
Neutro Abs: 3509 cells/uL (ref 1500–7800)
Neutrophils Relative %: 63.8 %
Platelets: 336 10*3/uL (ref 140–400)
RBC: 4.33 10*6/uL (ref 3.80–5.10)
RDW: 14.3 % (ref 11.0–15.0)
TOTAL LYMPHOCYTE: 17 %
WBC mixed population: 875 cells/uL (ref 200–950)
WBC: 5.5 10*3/uL (ref 3.8–10.8)

## 2017-09-04 LAB — BASIC METABOLIC PANEL WITH GFR
BUN/Creatinine Ratio: 24 (calc) — ABNORMAL HIGH (ref 6–22)
BUN: 23 mg/dL (ref 7–25)
CALCIUM: 9.3 mg/dL (ref 8.6–10.4)
CO2: 26 mmol/L (ref 20–32)
CREATININE: 0.97 mg/dL — AB (ref 0.60–0.93)
Chloride: 110 mmol/L (ref 98–110)
GFR, EST AFRICAN AMERICAN: 67 mL/min/{1.73_m2} (ref >=60)
GFR, Est Non African American: 58 mL/min/{1.73_m2} — ABNORMAL LOW (ref >=60)
Glucose, Bld: 90 mg/dL (ref 65–99)
Potassium: 4 mmol/L (ref 3.5–5.3)
Sodium: 142 mmol/L (ref 135–146)

## 2017-09-04 LAB — URINALYSIS, ROUTINE W REFLEX MICROSCOPIC
Bilirubin Urine: NEGATIVE
Glucose, UA: NEGATIVE
Ketones, ur: NEGATIVE
NITRITE: POSITIVE — AB
SPECIFIC GRAVITY, URINE: 1.02 (ref 1.001–1.03)
pH: 6 (ref 5.0–8.0)

## 2017-09-04 LAB — URINE CULTURE
MICRO NUMBER:: 90385294
SPECIMEN QUALITY: ADEQUATE

## 2017-09-04 LAB — HEPATIC FUNCTION PANEL
AG Ratio: 1 (calc) (ref 1.0–2.5)
ALT: 26 U/L (ref 6–29)
AST: 56 U/L — ABNORMAL HIGH (ref 10–35)
Albumin: 3 g/dL — ABNORMAL LOW (ref 3.6–5.1)
Alkaline phosphatase (APISO): 79 U/L (ref 33–130)
BILIRUBIN DIRECT: 0.4 mg/dL — AB (ref 0.0–0.2)
BILIRUBIN TOTAL: 1.3 mg/dL — AB (ref 0.2–1.2)
GLOBULIN: 3.1 g/dL (ref 1.9–3.7)
Indirect Bilirubin: 0.9 mg/dL (calc) (ref 0.2–1.2)
Total Protein: 6.1 g/dL (ref 6.1–8.1)

## 2017-09-04 LAB — LIPID PANEL
CHOL/HDL RATIO: 2.9 (calc) (ref <5.0)
CHOLESTEROL: 135 mg/dL (ref <200)
HDL: 46 mg/dL — ABNORMAL LOW (ref >50)
LDL CHOLESTEROL (CALC): 74 mg/dL
Non-HDL Cholesterol (Calc): 89 mg/dL (calc) (ref <130)
TRIGLYCERIDES: 74 mg/dL (ref <150)

## 2017-09-04 LAB — IRON, TOTAL/TOTAL IRON BINDING CAP
%SAT: 26 % (ref 11–50)
IRON: 76 ug/dL (ref 45–160)
TIBC: 287 ug/dL (ref 250–450)

## 2017-09-04 MED ORDER — CIPROFLOXACIN HCL 500 MG PO TABS: 500.0000 mg | ORAL_TABLET | Freq: Two times a day (BID) | ORAL | 0 refills | Status: DC

## 2017-09-04 NOTE — Assessment & Plan Note (Addendum)
Repeat colonoscopy - will schedule after SIBO breath test or after cipro effects She wants low vol prep

## 2017-09-04 NOTE — Assessment & Plan Note (Signed)
Reassess w/ EGD once diarrhea evaluated more

## 2017-09-04 NOTE — Progress Notes (Signed)
I would usually hold the Eliquis in this situation if it is continued and significant bleed particularly with anemia.  Dr. Percival Spanish

## 2017-09-04 NOTE — Progress Notes (Signed)
Brittney Valentine 77 y.o. 23-Nov-1940 [id number]  Assessment & Plan:  Esophageal varices Reassess w/ EGD once diarrhea evaluated more  Hepatic cirrhosis (HCC) Still Childs A PLTS NL, Coags have been ok No mass lesions Fall 2018 CT-A chest  History of colonic polyps Repeat colonoscopy - will schedule after SIBO breath test or after cipro effects She wants low vol prep  Chronic diarrhea She is a set-up for SIBO so breath testing makes sense Have ordered that but after she left I see that she is on cipro for hematuria ? UTI Should wait to see if that changes anything and will let her know whetjer or not to do the testing If cipro helps then observe Consider using bile salt sequestrant  Long term current use of anticoagulant - apixaban Would need to hold for EGD/colonoscopy  I appreciate the opportunity to care for this patient. [id number], Gwyndolyn Saxon, MD    Subjective:   Chief Complaint: hx colon polyp  HPI Here for f/u after getting colonoscopy recall - has hx 8 mm rectal adenoma 2013, also portal gastropathy and trace esophageal varices 2013 EGD. 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 Recent PCP w/u neg stool studies for C diff. Yogurt seems to help but is bothered by the sxs still. Not using anti-diarrheals.   Denies sig confusion, sleepiness, fatigue.   Indigestion has been a problems since off flecainide last yr and now on amlodipine and metoprolol. Ranitidine helps. Lab Results  Component Value Date   ALT 26 09/02/2017   AST 56 (H) 09/02/2017   ALKPHOS 75 07/06/2017   BILITOT 1.3 (H) 09/02/2017   Lab Results  Component Value Date   INR 1.64 04/12/2017   INR 1.21 04/02/2012   INR 1.2 12/08/2007   Lab Results  Component Value Date   WBC 5.5 09/02/2017     HGB 14.0 09/02/2017   HCT 40.6 09/02/2017   MCV 93.8 09/02/2017   PLT 336 09/02/2017     CT scan 04/11/2017, of the chest did demonstrate cirrhosis splenomegaly and changes of a periesophageal and perigastric varices and a small amount of ascites   Allergies  Allergen Reactions  . Ciprofloxacin     Swelling  . Robaxin [Methocarbamol]     palpiations  . Sulfonamide Derivatives     See little dots, skin feels like its burning  . Verapamil     palpitations   Current Meds  Medication Sig  . amLODipine (NORVASC) 2.5 MG tablet TAKE ONE TABLET EVERY DAY  . apixaban (ELIQUIS) 5 MG TABS tablet Take 1 tablet (5 mg total) by mouth 2 (two) times daily.  . Cholecalciferol (VITAMIN D-3) 1000 UNITS CAPS Take 1,000 Units by mouth 2 (two) times daily.   . ciprofloxacin (CIPRO) 500 MG tablet Take 1 tablet (500 mg total) by mouth 2 (two) times daily for 7 days.  Marland Kitchen levothyroxine (SYNTHROID, LEVOTHROID) 112 MCG tablet TAKE 1 TABLET BY MOUTH IN THE MORNING  . metoprolol succinate (TOPROL-XL) 100 MG 24 hr tablet Take 1.5 tablets (150mg ) by mouth daily  . ranitidine (ZANTAC) 300 MG tablet Take 1 tablet (300 mg total) at bedtime by mouth.   Past Medical History:  Diagnosis Date  . Acute UTI   . Anemia   . Blood transfusion without reported  diagnosis   . Cirrhosis ([place])   . Degenerative disk disease   . Diverticulosis   . Hemorrhoids   . Hypertension   . Kidney stones   . Obesity   . Paroxysmal atrial fibrillation (HCC)   . Partial small bowel obstruction ([place])   . Personal history of colonic polyps 03/25/2012   8 mm rectal adenoma 03/2012  . Portal hypertensive gastropathy ([place])   . Shingles   . Thyroid disease   . Varices, esophageal ([place])   . Zoster    Past Surgical History:  Procedure Laterality Date  . ABDOMINAL HYSTERECTOMY    . APPENDECTOMY  1991  . CHOLECYSTECTOMY  2000  . COLON SURGERY    . COLON SURGERY    . COLONOSCOPY    . ESOPHAGOGASTRODUODENOSCOPY  multiple    Social History   Social History Narrative  . Not on file   family history includes Heart attack in her father; Heart failure in her mother; Hypertension in her mother and sister.   Review of Systems As per HPI Having gross hematuria W/U pending  All other ROS negative Objective:   Physical Exam @BP  110/74   Pulse 77   Ht 5\' 5"  (1.651 m)   Wt 222 lb (100.7 kg)   BMI 36.94 kg/m @  General:  Well-developed, well-nourished and in no acute distress Eyes:  anicteric. ENT:   Mouth and posterior pharynx free of lesions.  Neck:   supple w/o thyromegaly or mass.  Lungs: Clear to auscultation bilaterally. Heart:  S1S2, no rubs, murmurs, gallops. Abdomen:  soft, non-tender, no hepatosplenomegaly, hernia, or mass and BS+. Multipe surgical scars and deformity Rectal: deferred Lymph:  no cervical or supraclavicular adenopathy. Extremities:   N2+ LE edema, cyanosis or clubbing Skin   no rash. Neuro:  A&O x 3. No asterixis Psych:  appropriate mood and  Affect.   Data Reviewed: See HPI Also Labs in EMR 2019

## 2017-09-04 NOTE — Patient Instructions (Signed)
  We are ordering a [facility] Breath Test for you. They will contact you before mailing the kit to you.   We will call you with results and plans.    I appreciate the opportunity to care for you. Silvano Rusk, MD, [facility]

## 2017-09-04 NOTE — Telephone Encounter (Signed)
I informed Brittney Valentine to let us know if the cipro helps her diarrhea. She is aware that the breath test is on hold. I will call [facility] on Monday as they have already closed for the day.

## 2017-09-04 NOTE — Assessment & Plan Note (Signed)
Still Ardine Eng A PLTS NL, Coags have been ok No mass lesions Fall 2018 CT-A chest

## 2017-09-04 NOTE — Assessment & Plan Note (Signed)
She is a set-up for SIBO so breath testing makes sense Have ordered that but after she left I see that she is on cipro for hematuria ? UTI Should wait to see if that changes anything and will let her know whetjer or not to do the testing If cipro helps then observe Consider using bile salt sequestrant

## 2017-09-04 NOTE — Assessment & Plan Note (Signed)
Would need to hold for EGD/colonoscopy

## 2017-09-04 NOTE — Telephone Encounter (Signed)
-----   Message from Gatha Mayer, MD sent at 09/04/2017  5:00 PM EDT ----- Regarding: no breath test Need to hold/cancel this  She is on cipro and did not register w/ me that she was and this will affect breath test  So tell her to let me know if the cipro Tx makes a difference in her diarrhea  If it does then will watch and wait (and schedule procedures)

## 2017-09-05 ENCOUNTER — Other Ambulatory Visit: Payer: Self-pay | Admitting: Physician Assistant

## 2017-09-05 DIAGNOSIS — E039 Hypothyroidism, unspecified: Secondary | ICD-10-CM

## 2017-09-05 DIAGNOSIS — I10 Essential (primary) hypertension: Secondary | ICD-10-CM | POA: Diagnosis not present

## 2017-09-07 NOTE — Telephone Encounter (Signed)
[facility] contacted and order canceled this AM.

## 2017-09-09 ENCOUNTER — Ambulatory Visit
Admission: RE | Admit: 2017-09-09 | Discharge: 2017-09-09 | Disposition: A | Discharge status: Home / Self Care | Payer: PPO | Source: Ambulatory Visit | Attending: Physician Assistant | Admitting: Physician Assistant

## 2017-09-09 DIAGNOSIS — R310 Gross hematuria: Secondary | ICD-10-CM

## 2017-09-09 DIAGNOSIS — K7460 Unspecified cirrhosis of liver: Secondary | ICD-10-CM | POA: Diagnosis not present

## 2017-09-10 ENCOUNTER — Telehealth (HOSPITAL_COMMUNITY): Payer: Self-pay | Admitting: *Deleted

## 2017-09-10 ENCOUNTER — Encounter (INDEPENDENT_AMBULATORY_CARE_PROVIDER_SITE_OTHER): Payer: Self-pay

## 2017-09-10 ENCOUNTER — Other Ambulatory Visit: Payer: Self-pay | Admitting: Physician Assistant

## 2017-09-10 DIAGNOSIS — K7689 Other specified diseases of liver: Secondary | ICD-10-CM

## 2017-09-10 DIAGNOSIS — Z79899 Other long term (current) drug therapy: Secondary | ICD-10-CM

## 2017-09-10 DIAGNOSIS — J918 Pleural effusion in other conditions classified elsewhere: Secondary | ICD-10-CM

## 2017-09-10 DIAGNOSIS — K769 Liver disease, unspecified: Principal | ICD-10-CM

## 2017-09-10 DIAGNOSIS — R188 Other ascites: Secondary | ICD-10-CM

## 2017-09-10 DIAGNOSIS — K529 Noninfective gastroenteritis and colitis, unspecified: Secondary | ICD-10-CM

## 2017-09-10 MED ORDER — FUROSEMIDE 40 MG PO TABS: 40.0000 mg | ORAL_TABLET | Freq: Every day | ORAL | 11 refills | Status: DC

## 2017-09-10 MED ORDER — METRONIDAZOLE 500 MG PO TABS: 500.0000 mg | ORAL_TABLET | Freq: Three times a day (TID) | ORAL | 0 refills | Status: AC

## 2017-09-10 MED ORDER — SPIRONOLACTONE 25 MG PO TABS: 25.0000 mg | ORAL_TABLET | Freq: Every day | ORAL | 11 refills | Status: DC

## 2017-09-10 NOTE — Telephone Encounter (Signed)
Patient called with the following info...  Notes recorded by Vicie Mutters, PA-C on 09/10/2017 at 1:22 PM EDT I have tried to reach you on the phone to discuss the CT, there are a lot of things going on so I'm going to try to write them down for you but please feel free to contact me at the office to go over this further or answer any questions.   1) You have a possible infection in your colon called colitis, you have had the cipro, if you still have issues please get the flagyl sent in for you. This is an antibiotic to help this. We will also be sending you back to your GI doctor.   2) You have a possible new nodule on your liver, we will do a dedicated imaging of your liver and we will get a lab on you to correlate with this, we will be sending you back to GI as well.   3) You have fluid in your lungs and AB which is new, I'm going to start you on Lasix 40mg  and spirolactone 25mg  once daily, these are both diuretics, they will cause you to pee. We also need to check your kidney function next week and see how you are doing, please monitor your blood pressure at this time too, if you get dizzy over the weekend or feel weak, please cut the lasix in half.   4) you have a large kidney stone that is likely contributing to the blood in your urine, I have sent you to urology, please follow up with them, they should be calling you.   5) due to the hematuria, and cardiology may not do a cardioversion at this time, and due to the new nodule on your liver and fluid in your lung, I'm suggesting that you stop your eliquis for now. We may have to do procedures in the near future so I would like you off the eliquis.   So stop the eliquis Start on the lasix and spirolactone for the extra fluid in your lung and stomach Take the flaygl IF you are still having diarrhea or stomach issues We will send you back to your GI doctor Follow up here next week for nurse visit- check BP, and labs  She will stop eliquis per  PCP recommendations.

## 2017-09-14 ENCOUNTER — Other Ambulatory Visit: Payer: Self-pay | Admitting: Physician Assistant

## 2017-09-14 DIAGNOSIS — K769 Liver disease, unspecified: Secondary | ICD-10-CM

## 2017-09-17 ENCOUNTER — Ambulatory Visit: Payer: PPO

## 2017-09-17 DIAGNOSIS — R188 Other ascites: Secondary | ICD-10-CM

## 2017-09-17 DIAGNOSIS — Z79899 Other long term (current) drug therapy: Secondary | ICD-10-CM

## 2017-09-17 DIAGNOSIS — K7689 Other specified diseases of liver: Secondary | ICD-10-CM

## 2017-09-17 NOTE — Progress Notes (Signed)
Pt reports for lab bld work. Pt has no concerns or questions at this time.

## 2017-09-18 LAB — HEPATIC FUNCTION PANEL
AG Ratio: 1 (calc) (ref 1.0–2.5)
ALBUMIN MSPROF: 3.1 g/dL — AB (ref 3.6–5.1)
ALT: 31 U/L — ABNORMAL HIGH (ref 6–29)
AST: 60 U/L — AB (ref 10–35)
Alkaline phosphatase (APISO): 83 U/L (ref 33–130)
BILIRUBIN INDIRECT: 1.3 mg/dL — AB (ref 0.2–1.2)
Bilirubin, Direct: 0.5 mg/dL — ABNORMAL HIGH (ref 0.0–0.2)
GLOBULIN: 3.2 g/dL (ref 1.9–3.7)
TOTAL PROTEIN: 6.3 g/dL (ref 6.1–8.1)
Total Bilirubin: 1.8 mg/dL — ABNORMAL HIGH (ref 0.2–1.2)

## 2017-09-18 LAB — BASIC METABOLIC PANEL WITH GFR
BUN / CREAT RATIO: 17 (calc) (ref 6–22)
BUN: 18 mg/dL (ref 7–25)
CO2: 27 mmol/L (ref 20–32)
CREATININE: 1.03 mg/dL — AB (ref 0.60–0.93)
Calcium: 9.3 mg/dL (ref 8.6–10.4)
Chloride: 108 mmol/L (ref 98–110)
GFR, EST NON AFRICAN AMERICAN: 54 mL/min/{1.73_m2} — AB (ref >=60)
GFR, Est African American: 62 mL/min/{1.73_m2} (ref >=60)
Glucose, Bld: 111 mg/dL — ABNORMAL HIGH (ref 65–99)
Potassium: 4.3 mmol/L (ref 3.5–5.3)
SODIUM: 144 mmol/L (ref 135–146)

## 2017-09-18 LAB — AFP TUMOR MARKER: AFP-Tumor Marker: 2.3 ng/mL

## 2017-09-18 LAB — CBC WITH DIFFERENTIAL/PLATELET
BASOS ABS: 52 {cells}/uL (ref 0–200)
BASOS PCT: 1 %
EOS PCT: 2.7 %
Eosinophils Absolute: 140 cells/uL (ref 15–500)
HEMATOCRIT: 41.6 % (ref 35.0–45.0)
HEMOGLOBIN: 14.2 g/dL (ref 11.7–15.5)
LYMPHS ABS: 972 {cells}/uL (ref 850–3900)
MCH: 31.5 pg (ref 27.0–33.0)
MCHC: 34.1 g/dL (ref 32.0–36.0)
MCV: 92.2 fL (ref 80.0–100.0)
MPV: 10.6 fL (ref 7.5–12.5)
Monocytes Relative: 12.1 %
NEUTROS ABS: 3406 {cells}/uL (ref 1500–7800)
Neutrophils Relative %: 65.5 %
Platelets: 361 10*3/uL (ref 140–400)
RBC: 4.51 10*6/uL (ref 3.80–5.10)
RDW: 14.4 % (ref 11.0–15.0)
Total Lymphocyte: 18.7 %
WBC mixed population: 629 cells/uL (ref 200–950)
WBC: 5.2 10*3/uL (ref 3.8–10.8)

## 2017-09-18 LAB — MAGNESIUM: MAGNESIUM: 1.9 mg/dL (ref 1.5–2.5)

## 2017-09-22 ENCOUNTER — Ambulatory Visit (HOSPITAL_COMMUNITY): Payer: PPO | Admitting: Nurse Practitioner

## 2017-09-26 ENCOUNTER — Ambulatory Visit
Admission: RE | Admit: 2017-09-26 | Discharge: 2017-09-26 | Disposition: A | Discharge status: Home / Self Care | Payer: PPO | Source: Ambulatory Visit | Attending: Physician Assistant | Admitting: Physician Assistant

## 2017-09-26 DIAGNOSIS — K7460 Unspecified cirrhosis of liver: Secondary | ICD-10-CM | POA: Diagnosis not present

## 2017-09-26 DIAGNOSIS — K769 Liver disease, unspecified: Secondary | ICD-10-CM

## 2017-09-26 MED ORDER — GADOBENATE DIMEGLUMINE 529 MG/ML IV SOLN
15.0000 mL | Freq: Once | INTRAVENOUS | Status: AC | PRN
  Administered 2017-09-26: 15 mL via INTRAVENOUS

## 2017-09-29 DIAGNOSIS — R188 Other ascites: Secondary | ICD-10-CM | POA: Insufficient documentation

## 2017-09-29 NOTE — Progress Notes (Signed)
Subjective:    Patient ID: Brittney Valentine, female    DOB: 1941/05/30, 77 y.o.   MRN: [id number]  HPI 77 y.o. obese WF with multiple comorbidities including cirrhosis with portal HTN and esophageal varices, afib not on coagulation at this time, OSA, HTN, CHF presents for follow up.  Last visit she was found to have acites with new pleural effusion, a large kidney stone and possible colitis versus edema.   She took cipro and did not take the flagyl, states diarrhea is better.  She was referred to urology for hematuria and the kidney stone- has OV April the 30th. She is off elliquis, has not had anymore hematuria.    She had an MRI liver that showed benign nodules and had a normal AFP.   She was started on lasix and spirolactone but she states she stopped it because she states she "flooded" the bathroom and then was very weak the day of and day after with low BP, so she stopped the medications and did not call the office, her weight is the same. She has some SOB, she has swelling in legs and AB.   Lab Results  Component Value Date   CREATININE 1.03 (H) 09/17/2017   BUN 18 09/17/2017   NA 144 09/17/2017   K 4.3 09/17/2017   CL 108 09/17/2017   CO2 27 09/17/2017    Wt Readings from Last 6 Encounters:  09/30/17 223 lb 12.8 oz (101.5 kg)  09/17/17 222 lb (100.7 kg)  09/04/17 222 lb (100.7 kg)  09/02/17 221 lb 12.8 oz (100.6 kg)  08/27/17 219 lb 9.6 oz (99.6 kg)  08/26/17 218 lb (98.9 kg)    Blood pressure 110/76, pulse 83, temperature 97.6 F (36.4 C), resp. rate 14, height 5\' 5"  (1.651 m), weight 223 lb 12.8 oz (101.5 kg), SpO2 98 %.  Medications Current Outpatient Medications on File Prior to Visit  Medication Sig  . amLODipine (NORVASC) 2.5 MG tablet TAKE ONE TABLET EVERY DAY  . Cholecalciferol (VITAMIN D-3) 1000 UNITS CAPS Take 1,000 Units by mouth 2 (two) times daily.   Marland Kitchen levothyroxine (SYNTHROID, LEVOTHROID) 112 MCG tablet TAKE 1 TABLET BY MOUTH IN THE MORNING  .  metoprolol succinate (TOPROL-XL) 100 MG 24 hr tablet Take 1.5 tablets (150mg ) by mouth daily  . ranitidine (ZANTAC) 300 MG tablet Take 1 tablet (300 mg total) at bedtime by mouth.   No current facility-administered medications on file prior to visit.     Problem list She has Obesity; History of small bowel obstruction; Hepatic cirrhosis ([place]); PORTAL HYPERTENSION; History of colonic polyps; Esophageal varices ([place]); Left sided sciatica; Atrial fibrillation ([place]); Medication management; Vitamin D deficiency; Encounter for Medicare annual wellness exam; Essential hypertension; Prediabetes; Hypothyroidism; Obstructive sleep apnea; Reactive airway disease, mild intermittent, uncomplicated; Dilated cardiomyopathy ([place]); Contraindication to anticoagulation therapy; Chronic combined systolic and diastolic CHF (congestive heart failure) ([place]); Fatigue; Chronic diarrhea; Long term current use of anticoagulant - apixaban; and Ascites on their problem list.    Review of Systems  Constitutional: Positive for fatigue. Negative for chills, diaphoresis and fever.  HENT: Negative.   Respiratory: Positive for shortness of breath. Negative for cough, chest tightness, wheezing and stridor.   Cardiovascular: Positive for leg swelling. Negative for chest pain and palpitations.  Gastrointestinal: Positive for abdominal distention and nausea. Negative for abdominal pain, anal bleeding, blood in stool, constipation, diarrhea, rectal pain and vomiting.       + GERD  Genitourinary: Negative.   Musculoskeletal: Negative for  arthralgias, back pain, gait problem, joint swelling, myalgias, neck pain and neck stiffness.  Skin: Negative.   Neurological: Negative for dizziness and headaches.       Objective:   Physical Exam  Constitutional: She is oriented to person, place, and time. She appears well-developed and well-nourished. No distress.  HENT:  Head: Normocephalic and atraumatic.  Mouth/Throat: No oropharyngeal  exudate.  Eyes: Pupils are equal, round, and reactive to light. Conjunctivae are normal.  Neck: Normal range of motion. Neck supple.  Cardiovascular: Normal pulses. An irregularly irregular rhythm present.  No extrasystoles are present. Tachycardia present.  Pulmonary/Chest: Effort normal. No accessory muscle usage. No respiratory distress. She has decreased breath sounds (bilateral bases). She has no wheezes.  Abdominal: Soft. Bowel sounds are normal. She exhibits ascites. She exhibits no distension and no mass. There is hepatomegaly. There is generalized tenderness. There is no rebound and no guarding.  Musculoskeletal: Normal range of motion. She exhibits edema.  Lymphadenopathy:    She has no cervical adenopathy.  Neurological: She is alert and oriented to person, place, and time. No cranial nerve deficit.  Skin: Skin is warm and dry. No rash noted.  Psychiatric: She has a normal mood and affect. Her behavior is normal.       Assessment & Plan:    Persistent atrial fibrillation ([place]) We discussed getting back on elliquis No more hematuria,we will wait until after urology OV next week and then restart after that visit.   Chronic combined systolic and diastolic CHF (congestive heart failure) (HCC) Other cirrhosis of liver ([place]) Other ascites Patient is suppose to be on lasix/spirolactone She took herself off likely due to hypotension LONG discussion that she needs to contact office before stopping medications Weight is unchanged Will stop norvasc, start back on lasix only 1/2 pill or 20 mg a day with spirolactone 25mg  a day- may need to cut back on metoprolol Discussed salt/fluid restriction Will keep log of BP and weight Close follow up 1 week will get labs at that visit ER precautions discussed with patient.   Hematuria, unspecified type No more hematuria Has urology OV next week Pending visit will restart elliquis

## 2017-09-30 ENCOUNTER — Encounter: Payer: Self-pay | Admitting: Physician Assistant

## 2017-09-30 ENCOUNTER — Ambulatory Visit: Payer: PPO | Admitting: Physician Assistant

## 2017-09-30 VITALS — BP 110/76 | HR 83 | Temp 97.6°F | Resp 14 | Ht 65.0 in | Wt 223.8 lb

## 2017-09-30 DIAGNOSIS — K7469 Other cirrhosis of liver: Secondary | ICD-10-CM | POA: Diagnosis not present

## 2017-09-30 DIAGNOSIS — I481 Persistent atrial fibrillation: Secondary | ICD-10-CM | POA: Diagnosis not present

## 2017-09-30 DIAGNOSIS — R188 Other ascites: Secondary | ICD-10-CM

## 2017-09-30 DIAGNOSIS — I5042 Chronic combined systolic (congestive) and diastolic (congestive) heart failure: Secondary | ICD-10-CM | POA: Diagnosis not present

## 2017-09-30 DIAGNOSIS — R319 Hematuria, unspecified: Secondary | ICD-10-CM | POA: Diagnosis not present

## 2017-09-30 DIAGNOSIS — I4819 Other persistent atrial fibrillation: Secondary | ICD-10-CM

## 2017-09-30 NOTE — Patient Instructions (Addendum)
Stop the norvasc/amlodipine  Take 1/2 of the lasix or the 20 mg a day and take the spirolactone 25mg  and see how you do this.  Be on this for one week and if you are doing well we will increase the spirolactone 50mg .  If you feel bad with this still cut your metoprolol in half  Monitor your blood pressure and your weight- keep a log of this daily.  Try to limit your fluids to less 60 oz day Eat low salt foods-Limit salt (sodium) to 2000 mg per day. Best thing to do is avoid processed foods.    Call your doctor if:  Anytime you have any of the following symptoms:  1) 2 pound weight gain in 24 hours or 5 pounds in 1 week  2) shortness of breath, with or without a dry hacking cough  3) swelling in the hands, LEGs, feet or stomach  4) if you have to sleep on extra pillows at night in order to breathe. 5) after laying down at night for 20-30 mins, you wake up short of breath.    Ascites Ascites is a collection of excess fluid in the abdomen. Ascites can range from mild to severe. It can get worse without treatment. What are the causes? Possible causes include:  Cirrhosis. This is the most common cause of ascites.  Infection or inflammation in the abdomen.  Cancer in the abdomen.  Heart failure.  Kidney disease.  Inflammation of the pancreas.  Clots in the veins of the liver.  What are the signs or symptoms? Signs and symptoms may include:  A feeling of fullness in your abdomen. This is common.  An increase in the size of your abdomen or your waist.  Swelling in your legs.  Swelling of the scrotum in men.  Difficulty breathing.  Abdominal pain.  Sudden weight gain.  If the condition is mild, you may not have symptoms. How is this diagnosed? To make a diagnosis, your health care provider will:  Ask about your medical history.  Perform a physical exam.  Order imaging tests, such as an ultrasound or CT scan of your abdomen.  How is this treated? Treatment  depends on the cause of the ascites. It may include:  Taking a pill to make you urinate. This is called a water pill (diuretic pill).  Strictly reducing your salt (sodium) intake. Salt can cause extra fluid to be kept in the body, and this makes ascites worse.  Having a procedure to remove fluid from your abdomen (paracentesis).  Having a procedure to transfer fluid from your abdomen into a vein.  Having a procedure that connects two of the major veins within your liver and relieves pressure on your liver (TIPS procedure).  Ascites may go away or improve with treatment of the condition that caused it. Follow these instructions at home:  Keep track of your weight. To do this, weigh yourself at the same time every day and record your weight.  Keep track of how much you drink and any changes in the amount you urinate.  Follow any instructions that your health care provider gives you about how much to drink.  Try not to eat salty (high-sodium) foods.  Take medicines only as directed by your health care provider.  Keep all follow-up visits as directed by your health care provider. This is important.  Report any changes in your health to your health care provider, especially if you develop new symptoms or your symptoms get worse. Contact  a health care provider if:  Your gain more than 3 pounds in 3 days.  Your abdominal size or your waist size increases.  You have new swelling in your legs.  The swelling in your legs gets worse. Get help right away if:  You develop a fever.  You develop confusion.  You develop new or worsening difficulty breathing.  You develop new or worsening abdominal pain.  You develop new or worsening swelling in the scrotum (in men). This information is not intended to replace advice given to you by your health care provider. Make sure you discuss any questions you have with your health care provider. Document Released: 05/26/2005 Document Revised:  10/03/2015 Document Reviewed: 12/23/2013 Elsevier Interactive Patient Education  [employer].

## 2017-10-02 LAB — BLOOD GAS, ARTERIAL
Acid-base deficit: 8.8 mmol/L — ABNORMAL HIGH (ref 0.0–2.0)
Bicarbonate: 16.1 mmol/L — ABNORMAL LOW (ref 20.0–28.0)
Drawn by: 225631
O2 Saturation: 92.7 %
PATIENT TEMPERATURE: 98.6
PO2 ART: 69.7 mmHg — AB (ref 83.0–108.0)
pCO2 arterial: 32.8 mmHg (ref 32.0–48.0)
pH, Arterial: 7.311 — ABNORMAL LOW (ref 7.350–7.450)

## 2017-10-06 DIAGNOSIS — I10 Essential (primary) hypertension: Secondary | ICD-10-CM | POA: Diagnosis not present

## 2017-10-06 DIAGNOSIS — N202 Calculus of kidney with calculus of ureter: Secondary | ICD-10-CM | POA: Diagnosis not present

## 2017-10-06 DIAGNOSIS — R310 Gross hematuria: Secondary | ICD-10-CM | POA: Diagnosis not present

## 2017-10-08 ENCOUNTER — Ambulatory Visit (INDEPENDENT_AMBULATORY_CARE_PROVIDER_SITE_OTHER): Payer: PPO | Admitting: Physician Assistant

## 2017-10-08 ENCOUNTER — Encounter: Payer: Self-pay | Admitting: Physician Assistant

## 2017-10-08 VITALS — BP 124/80 | HR 102 | Temp 97.7°F | Resp 16 | Ht 65.0 in | Wt 211.8 lb

## 2017-10-08 DIAGNOSIS — I481 Persistent atrial fibrillation: Secondary | ICD-10-CM | POA: Diagnosis not present

## 2017-10-08 DIAGNOSIS — I5042 Chronic combined systolic (congestive) and diastolic (congestive) heart failure: Secondary | ICD-10-CM

## 2017-10-08 DIAGNOSIS — I4819 Other persistent atrial fibrillation: Secondary | ICD-10-CM

## 2017-10-08 DIAGNOSIS — K7469 Other cirrhosis of liver: Secondary | ICD-10-CM | POA: Diagnosis not present

## 2017-10-08 DIAGNOSIS — Z79899 Other long term (current) drug therapy: Secondary | ICD-10-CM

## 2017-10-08 NOTE — Patient Instructions (Signed)
Continue the lasix 20 mg and spirloactone 12.5mg  Stay off the norvasc Will check your kidney function   Do the following things EVERYDAY: 1) Weigh yourself in the morning before breakfast or at the same time every day. Write it down and keep it in a log. 2) Take your medicines as prescribed 3) Eat low salt foods-Limit salt (sodium) to 2000 mg per day. Best thing to do is avoid processed foods.   4) Stay as active as you can everyday 5) Limit all fluids for the day to less than 1.5 liters  Call your doctor if:  Anytime you have any of the following symptoms:  1) 2 pound weight gain in 24 hours or 5 pounds in 1 week  2) shortness of breath, with or without a dry hacking cough  3) swelling in the hands, LEGs, feet or stomach  4) if you have to sleep on extra pillows at night in order to breathe. 5) after laying down at night for 20-30 mins, you wake up short of breath.   These can all be signs of fluid overload.    Heart Failure Heart failure means your heart has trouble pumping blood. This makes it hard for your body to work well. Heart failure is usually a long-term (chronic) condition. You must take good care of yourself and follow your doctor's treatment plan. Follow these instructions at home:  Take your heart medicine as told by your doctor. ? Do not stop taking medicine unless your doctor tells you to. ? Do not skip any dose of medicine. ? Refill your medicines before they run out. ? Take other medicines only as told by your doctor or pharmacist.  Stay active if told by your doctor. The elderly and people with severe heart failure should talk with a doctor about physical activity.  Eat heart-healthy foods. Choose foods that are without trans fat and are low in saturated fat, cholesterol, and salt (sodium). This includes fresh or frozen fruits and vegetables, fish, lean meats, fat-free or low-fat dairy foods, whole grains, and high-fiber foods. Lentils and dried peas and beans  (legumes) are also good choices.  Limit salt if told by your doctor.  Cook in a healthy way. Roast, grill, broil, bake, poach, steam, or stir-fry foods.  Limit fluids as told by your doctor.  Weigh yourself every morning. Do this after you pee (urinate) and before you eat breakfast. Write down your weight to give to your doctor.  Take your blood pressure and write it down if your doctor tells you to.  Ask your doctor how to check your pulse. Check your pulse as told.  Lose weight if told by your doctor.  Stop smoking or chewing tobacco. Do not use gum or patches that help you quit without your doctor's approval.  Schedule and go to doctor visits as told.  Nonpregnant women should have no more than 1 drink a day. Men should have no more than 2 drinks a day. Talk to your doctor about drinking alcohol.  Stop illegal drug use.  Stay current with shots (immunizations).  Manage your health conditions as told by your doctor.  Learn to manage your stress.  Rest when you are tired.  If it is really hot outside: ? Avoid intense activities. ? Use air conditioning or fans, or get in a cooler place. ? Avoid caffeine and alcohol. ? Wear loose-fitting, lightweight, and light-colored clothing.  If it is really cold outside: ? Avoid intense activities. ? Layer your clothing. ?  Wear mittens or gloves, a hat, and a scarf when going outside. ? Avoid alcohol.  Learn about heart failure and get support as needed.  Get help to maintain or improve your quality of life and your ability to care for yourself as needed. Contact a doctor if:  You gain weight quickly.  You are more short of breath than usual.  You cannot do your normal activities.  You tire easily.  You cough more than normal, especially with activity.  You have any or more puffiness (swelling) in areas such as your hands, feet, ankles, or belly (abdomen).  You cannot sleep because it is hard to breathe.  You feel  like your heart is beating fast (palpitations).  You get dizzy or light-headed when you stand up. Get help right away if:  You have trouble breathing.  There is a change in mental status, such as becoming less alert or not being able to focus.  You have chest pain or discomfort.  You faint. This information is not intended to replace advice given to you by your health care provider. Make sure you discuss any questions you have with your health care provider. Document Released: 03/04/2008 Document Revised: 11/01/2015 Document Reviewed: 07/12/2012 Elsevier Interactive Patient Education  2017 [employer].

## 2017-10-08 NOTE — Progress Notes (Signed)
Subjective:    Patient ID: Brittney Valentine, female    DOB: 10-07-1940, 77 y.o.   MRN: [id number]  HPI 77 y.o. obese WF with multiple comorbidities including cirrhosis with portal HTN and esophageal varices, afib not on coagulation at this time, OSA, HTN, CHF presents for follow up.  Last visit she was found to have acites with new pleural effusion, a large kidney stone and possible colitis versus edema.   She saw urology on April 30th for hematuria, they are going to do an extraction, she is still off the elliquis.    She had an MRI liver that showed benign nodules and had a normal AFP.   She was started on lasix and spirolactone lower dose last visit to see if she could tolerate it. She has been on lasix 20 and spirolactone 12.5, she is off her norvasc, she has lost 12 lbs since her last visit and her legs have less swelling, her AB has decreased in swelling. She has had some dizziness and nausea.   Lab Results  Component Value Date   CREATININE 1.03 (H) 09/17/2017   BUN 18 09/17/2017   NA 144 09/17/2017   K 4.3 09/17/2017   CL 108 09/17/2017   CO2 27 09/17/2017    Wt Readings from Last 6 Encounters:  10/08/17 211 lb 12.8 oz (96.1 kg)  09/30/17 223 lb 12.8 oz (101.5 kg)  09/17/17 222 lb (100.7 kg)  09/04/17 222 lb (100.7 kg)  09/02/17 221 lb 12.8 oz (100.6 kg)  08/27/17 219 lb 9.6 oz (99.6 kg)    Blood pressure 124/80, pulse (!) 102, temperature 97.7 F (36.5 C), resp. rate 16, height 5\' 5"  (1.651 m), weight 211 lb 12.8 oz (96.1 kg), SpO2 96 %.  Medications Current Outpatient Medications on File Prior to Visit  Medication Sig  . amLODipine (NORVASC) 2.5 MG tablet TAKE ONE TABLET EVERY DAY  . Cholecalciferol (VITAMIN D-3) 1000 UNITS CAPS Take 1,000 Units by mouth 2 (two) times daily.   Marland Kitchen levothyroxine (SYNTHROID, LEVOTHROID) 112 MCG tablet TAKE 1 TABLET BY MOUTH IN THE MORNING  . metoprolol succinate (TOPROL-XL) 100 MG 24 hr tablet Take 1.5 tablets (150mg ) by mouth daily   . ranitidine (ZANTAC) 300 MG tablet Take 1 tablet (300 mg total) at bedtime by mouth.   No current facility-administered medications on file prior to visit.     Problem list She has Obesity; History of small bowel obstruction; Hepatic cirrhosis ([place]); PORTAL HYPERTENSION; History of colonic polyps; Esophageal varices ([place]); Left sided sciatica; Atrial fibrillation ([place]); Medication management; Vitamin D deficiency; Encounter for Medicare annual wellness exam; Essential hypertension; Prediabetes; Hypothyroidism; Obstructive sleep apnea; Reactive airway disease, mild intermittent, uncomplicated; Dilated cardiomyopathy ([place]); Contraindication to anticoagulation therapy; Chronic combined systolic and diastolic CHF (congestive heart failure) ([place]); Fatigue; Chronic diarrhea; Long term current use of anticoagulant - apixaban; and Ascites on their problem list.    Review of Systems  Constitutional: Positive for fatigue. Negative for chills, diaphoresis and fever.  HENT: Negative.   Respiratory: Negative for cough, chest tightness, shortness of breath, wheezing and stridor.   Cardiovascular: Negative for chest pain, palpitations and leg swelling.  Gastrointestinal: Positive for abdominal distention and nausea. Negative for abdominal pain, anal bleeding, blood in stool, constipation, diarrhea, rectal pain and vomiting.       + GERD  Genitourinary: Negative.   Musculoskeletal: Negative for arthralgias, back pain, gait problem, joint swelling, myalgias, neck pain and neck stiffness.  Skin: Negative.   Neurological: Negative  for dizziness and headaches.       Objective:   Physical Exam  Constitutional: She is oriented to person, place, and time. She appears well-developed and well-nourished. No distress.  HENT:  Head: Normocephalic and atraumatic.  Mouth/Throat: No oropharyngeal exudate.  Eyes: Pupils are equal, round, and reactive to light. Conjunctivae are normal.  Neck: Normal range of  motion. Neck supple.  Cardiovascular: Normal pulses. An irregularly irregular rhythm present.  No extrasystoles are present. Tachycardia present.  Pulmonary/Chest: Effort normal. No accessory muscle usage. No respiratory distress. She has decreased breath sounds (bilateral bases improved but still present). She has no wheezes.  Abdominal: Soft. Bowel sounds are normal. She exhibits no distension, no ascites and no mass. There is hepatomegaly. There is no tenderness. There is no rebound and no guarding.  Musculoskeletal: Normal range of motion. She exhibits no edema (no edema at this time).  Lymphadenopathy:    She has no cervical adenopathy.  Neurological: She is alert and oriented to person, place, and time. No cranial nerve deficit.  Skin: Skin is warm and dry. No rash noted.  Psychiatric: She has a normal mood and affect. Her behavior is normal.       Assessment & Plan:    Persistent atrial fibrillation ([place]) Has OV with Butch Penny NP 05/15   Chronic combined systolic and diastolic CHF (congestive heart failure) ([place]) Other cirrhosis of liver ([place]) Other ascites Continue current dose lasix/spirolactone She took herself off likely due to hypotension Weight is improved, will monitor Stay off norvasc Discussed salt/fluid restriction Will keep log of BP and weight ER precautions discussed with patient.   Hematuria, unspecified type No more hematuria States going to have procedure with urology May start elliquis AFTER the procedure   Future Appointments  Date Time Provider [place]  10/21/2017  2:30 PM Sherran Needs, NP [facility] None  12/03/2017  3:45 PM Unk Pinto, MD [facility] None  02/17/2018 10:00 AM Vicie Mutters, PA-C [facility] None

## 2017-10-09 ENCOUNTER — Other Ambulatory Visit: Payer: Self-pay | Admitting: Physician Assistant

## 2017-10-09 ENCOUNTER — Telehealth: Payer: Self-pay

## 2017-10-09 DIAGNOSIS — J90 Pleural effusion, not elsewhere classified: Secondary | ICD-10-CM

## 2017-10-09 LAB — COMPLETE METABOLIC PANEL WITH GFR
AG Ratio: 0.9 (calc) — ABNORMAL LOW (ref 1.0–2.5)
ALBUMIN MSPROF: 2.9 g/dL — AB (ref 3.6–5.1)
ALT: 26 U/L (ref 6–29)
AST: 61 U/L — ABNORMAL HIGH (ref 10–35)
Alkaline phosphatase (APISO): 80 U/L (ref 33–130)
BUN / CREAT RATIO: 14 (calc) (ref 6–22)
BUN: 16 mg/dL (ref 7–25)
CALCIUM: 9.5 mg/dL (ref 8.6–10.4)
CO2: 30 mmol/L (ref 20–32)
CREATININE: 1.16 mg/dL — AB (ref 0.60–0.93)
Chloride: 106 mmol/L (ref 98–110)
GFR, EST AFRICAN AMERICAN: 54 mL/min/{1.73_m2} — AB (ref >=60)
GFR, EST NON AFRICAN AMERICAN: 46 mL/min/{1.73_m2} — AB (ref >=60)
GLOBULIN: 3.1 g/dL (ref 1.9–3.7)
Glucose, Bld: 107 mg/dL — ABNORMAL HIGH (ref 65–99)
Potassium: 4.9 mmol/L (ref 3.5–5.3)
SODIUM: 141 mmol/L (ref 135–146)
TOTAL PROTEIN: 6 g/dL — AB (ref 6.1–8.1)
Total Bilirubin: 1.5 mg/dL — ABNORMAL HIGH (ref 0.2–1.2)

## 2017-10-09 LAB — MAGNESIUM: Magnesium: 2 mg/dL (ref 1.5–2.5)

## 2017-10-09 NOTE — Telephone Encounter (Signed)
Per Dr Carlean Purl patient added on for June 6th at 2:15pm.

## 2017-10-09 NOTE — Telephone Encounter (Signed)
-----   Message from Gatha Mayer, MD sent at 10/08/2017  5:56 PM EDT ----- Regarding: needs f/u Please ask her to see me in June  We can use an open new patient slot if necessary

## 2017-10-13 ENCOUNTER — Other Ambulatory Visit: Payer: Self-pay | Admitting: Urology

## 2017-10-20 DIAGNOSIS — N202 Calculus of kidney with calculus of ureter: Secondary | ICD-10-CM | POA: Diagnosis not present

## 2017-10-21 ENCOUNTER — Other Ambulatory Visit: Payer: Self-pay

## 2017-10-21 ENCOUNTER — Ambulatory Visit (HOSPITAL_COMMUNITY)
Admission: RE | Admit: 2017-10-21 | Discharge: 2017-10-21 | Disposition: A | Discharge status: Home / Self Care | Payer: PPO | Source: Ambulatory Visit | Attending: Nurse Practitioner | Admitting: Nurse Practitioner

## 2017-10-21 VITALS — BP 118/76 | HR 95 | Ht 65.0 in | Wt 206.0 lb

## 2017-10-21 DIAGNOSIS — I8510 Secondary esophageal varices without bleeding: Secondary | ICD-10-CM | POA: Insufficient documentation

## 2017-10-21 DIAGNOSIS — I480 Paroxysmal atrial fibrillation: Secondary | ICD-10-CM | POA: Insufficient documentation

## 2017-10-21 DIAGNOSIS — Z79890 Hormone replacement therapy (postmenopausal): Secondary | ICD-10-CM | POA: Insufficient documentation

## 2017-10-21 DIAGNOSIS — K56600 Partial intestinal obstruction, unspecified as to cause: Secondary | ICD-10-CM | POA: Insufficient documentation

## 2017-10-21 DIAGNOSIS — I4891 Unspecified atrial fibrillation: Secondary | ICD-10-CM | POA: Diagnosis present

## 2017-10-21 DIAGNOSIS — I10 Essential (primary) hypertension: Secondary | ICD-10-CM | POA: Insufficient documentation

## 2017-10-21 DIAGNOSIS — E079 Disorder of thyroid, unspecified: Secondary | ICD-10-CM | POA: Insufficient documentation

## 2017-10-21 DIAGNOSIS — K766 Portal hypertension: Secondary | ICD-10-CM | POA: Diagnosis not present

## 2017-10-21 DIAGNOSIS — Z8249 Family history of ischemic heart disease and other diseases of the circulatory system: Secondary | ICD-10-CM | POA: Insufficient documentation

## 2017-10-21 DIAGNOSIS — I4819 Other persistent atrial fibrillation: Secondary | ICD-10-CM

## 2017-10-21 DIAGNOSIS — Z79899 Other long term (current) drug therapy: Secondary | ICD-10-CM | POA: Insufficient documentation

## 2017-10-21 DIAGNOSIS — K7460 Unspecified cirrhosis of liver: Secondary | ICD-10-CM | POA: Insufficient documentation

## 2017-10-21 DIAGNOSIS — Z882 Allergy status to sulfonamides status: Secondary | ICD-10-CM | POA: Diagnosis not present

## 2017-10-21 DIAGNOSIS — K3189 Other diseases of stomach and duodenum: Secondary | ICD-10-CM | POA: Insufficient documentation

## 2017-10-21 DIAGNOSIS — I8500 Esophageal varices without bleeding: Secondary | ICD-10-CM | POA: Insufficient documentation

## 2017-10-21 DIAGNOSIS — I481 Persistent atrial fibrillation: Secondary | ICD-10-CM | POA: Insufficient documentation

## 2017-10-21 DIAGNOSIS — Z881 Allergy status to other antibiotic agents status: Secondary | ICD-10-CM | POA: Diagnosis not present

## 2017-10-21 DIAGNOSIS — Z87442 Personal history of urinary calculi: Secondary | ICD-10-CM | POA: Insufficient documentation

## 2017-10-21 NOTE — Patient Instructions (Signed)
Brittney Valentine  10/21/2017   Your procedure is scheduled on: 10-26-17  Report to [facility] Main  Entrance              Report to admitting at     1000  AM    Call this number if you have problems the morning of surgery [phone]   Remember: Do not eat food or drink liquids :After Midnight.     Take these medicines the morning of surgery with A SIP OF WATER: metoprolol, levothyroxine, amlodipine if it is your day to take.                                You may not have any metal on your body including hair pins and              piercings  Do not wear jewelry, make-up, lotions, powders or perfumes, deodorant             Do not wear nail polish.  Do not shave  48 hours prior to surgery.               Do not bring valuables to the hospital. [place].  Contacts, dentures or bridgework may not be worn into surgery.      Patients discharged the day of surgery will not be allowed to drive home.  Name and phone number of your driver:  Special Instructions: N/A              Please read over the following fact sheets you were given: _____________________________________________________________________          [facility] [place] - Preparing for Surgery Before surgery, you can play an important role.  Because skin is not sterile, your skin needs to be as free of germs as possible.  You can reduce the number of germs on your skin by washing with CHG (chlorahexidine gluconate) soap before surgery.  CHG is an antiseptic cleaner which kills germs and bonds with the skin to continue killing germs even after washing. Please DO NOT use if you have an allergy to CHG or antibacterial soaps.  If your skin becomes reddened/irritated stop using the CHG and inform your nurse when you arrive at Short Stay. Do not shave (including legs and underarms) for at least 48 hours prior to the first CHG shower.  You may shave your  face/neck. Please follow these instructions carefully:  1.  Shower with CHG Soap the night before surgery and the  morning of Surgery.  2.  If you choose to wash your hair, wash your hair first as usual with your  normal  shampoo.  3.  After you shampoo, rinse your hair and body thoroughly to remove the  shampoo.                           4.  Use CHG as you would any other liquid soap.  You can apply chg directly  to the skin and wash                       Gently with a scrungie or clean washcloth.  5.  Apply the  CHG Soap to your body ONLY FROM THE NECK DOWN.   Do not use on face/ open                           Wound or open sores. Avoid contact with eyes, ears mouth and genitals (private parts).                       Wash face,  Genitals (private parts) with your normal soap.             6.  Wash thoroughly, paying special attention to the area where your surgery  will be performed.  7.  Thoroughly rinse your body with warm water from the neck down.  8.  DO NOT shower/wash with your normal soap after using and rinsing off  the CHG Soap.                9.  Pat yourself dry with a clean towel.            10.  Wear clean pajamas.            11.  Place clean sheets on your bed the night of your first shower and do not  sleep with pets. Day of Surgery : Do not apply any lotions/deodorants the morning of surgery.  Please wear clean clothes to the hospital/surgery center.  FAILURE TO FOLLOW THESE INSTRUCTIONS MAY RESULT IN THE CANCELLATION OF YOUR SURGERY PATIENT SIGNATURE_________________________________  NURSE SIGNATURE__________________________________  ________________________________________________________________________

## 2017-10-21 NOTE — Progress Notes (Addendum)
Primary Care Physician: Unk Pinto, MD Referring Physician: Dr. Meryl Crutch Brittney Valentine is a 77 y.o. female with a h/o afib, for many years controlled on flecainide. But in the setting of acute illness, 04/14/17, it returned. She also had VT in the hospital with acute reduction in EF. She was placed on amiodarone. This  was stopped at the time of Dr. Jackalyn Lombard visit, 07/06/17,  for significant thyroid dysfunction. His plan was to start anticoagulation and in 2-4 weeks, consider hospitalization for sotalol. She has h/o cirrhosis and esophageal varices and was not on anticoagulation. She  was placed on eliquis 5 mg bid after Dr. Rayann Heman discussed with Dr. Percival Spanish. She did have some hematuria but has resolved and she did not have to stop anticoagulation.No other signs of bleeding.  In the clinic 2/26, we discussed Dr.Allred's plan to start sotalol to achieve NSR, after on anticoagulation x 3 weeks and necessary hospitalization. Pt is very clear that she does not want hospitalization at this time. She is just now starting to feel a little better and does not want to come into the hospital or take another med.  F/u in afib clinic, 3/20, one month from above visit to discuss sotalol admission. She has been taking anticoagulation as ordered.  Pt reports over the last month, she has had multiple clear stools a day and has lost 13 lbs.  NO blood noted in diarrhea.She reports taking Keflex in February for UTI. She thought it might be from eliquis but she has not called here or to PCP to report diarrhea. She has lost 13 lbs. She is still fairly asymptomatic with rate controlled afib, but still does not want to entertain the thought of starting sotalol now, best to wait until the diarrhea can be resolved.   F/u afib clinic, 5/16. She is off anticoagulation for abnormal test findings. She is pending a kidney stone removal  Next week and a endo/ colonoscopy after that. She is anxious to get back on blood thinner  when possible as she is now interested in hospitalization for sotalol to resume NSR. The longer she stays in afib the more she feels it is draining her energy.  Today, she denies symptoms of palpitations, chest pain, shortness of breath, orthopnea, PND, lower extremity edema, dizziness, presyncope, syncope, or neurologic sequela. The patient is tolerating medications without difficulties and is otherwise without complaint today.   Past Medical History:  Diagnosis Date  . Acute UTI   . Anemia   . Blood transfusion without reported diagnosis   . Cirrhosis ([place])   . Degenerative disk disease   . Diverticulosis   . Hemorrhoids   . Hypertension   . Kidney stones   . Obesity   . Paroxysmal atrial fibrillation ([facility])   . Partial small bowel obstruction ([place])   . Personal history of colonic polyps 03/25/2012   8 mm rectal adenoma 03/2012  . Portal hypertensive gastropathy ([place])   . Shingles   . Thyroid disease   . Varices, esophageal ([place])   . Zoster    Past Surgical History:  Procedure Laterality Date  . ABDOMINAL HYSTERECTOMY    . APPENDECTOMY  1991  . CHOLECYSTECTOMY  2000  . COLON SURGERY    . COLON SURGERY    . COLONOSCOPY    . ESOPHAGOGASTRODUODENOSCOPY  multiple    Current Outpatient Medications  Medication Sig Dispense Refill  . amLODipine (NORVASC) 2.5 MG tablet TAKE ONE TABLET EVERY DAY (Patient taking differently: TAKE ONE  TABLET EVERY DAY IN THE AFTERNOON.) 30 tablet 2  . Cholecalciferol (VITAMIN D-3) 1000 UNITS CAPS Take 1,000 Units by mouth 2 (two) times daily.     . furosemide (LASIX) 40 MG tablet Take 20 mg by mouth daily after lunch.    . levothyroxine (SYNTHROID, LEVOTHROID) 112 MCG tablet TAKE 1 TABLET BY MOUTH IN THE MORNING 90 tablet 1  . metoprolol succinate (TOPROL-XL) 100 MG 24 hr tablet Take 1.5 tablets (150mg ) by mouth daily (Patient taking differently: Take 150 mg by mouth daily after lunch. ) 135 tablet 1  . ranitidine (ZANTAC) 300 MG tablet Take 1  tablet (300 mg total) at bedtime by mouth. (Patient taking differently: Take 300 mg by mouth daily as needed (FOR INDIGESTION/HEARTBURN.). ) 30 tablet 1  . spironolactone (ALDACTONE) 25 MG tablet Take 25 mg by mouth daily after lunch.     No current facility-administered medications for this encounter.     Allergies  Allergen Reactions  . Ciprofloxacin Swelling  . Sulfonamide Derivatives Other (See Comments)    See little dots, skin feels like its burning  . Robaxin [Methocarbamol] Palpitations  . Verapamil Palpitations    Social History   Socioeconomic History  . Marital status: Single    Spouse name: Not on file  . Number of children: Not on file  . Years of education: Not on file  . Highest education level: Not on file  Occupational History  . Not on file  Social Needs  . Financial resource strain: Not on file  . Food insecurity:    Worry: Not on file    Inability: Not on file  . Transportation needs:    Medical: Not on file    Non-medical: Not on file  Tobacco Use  . Smoking status: Never Smoker  . Smokeless tobacco: Never Used  Substance and Sexual Activity  . Alcohol use: No  . Drug use: No  . Sexual activity: Not Currently    Partners: Male  Lifestyle  . Physical activity:    Days per week: Not on file    Minutes per session: Not on file  . Stress: Not on file  Relationships  . Social connections:    Talks on phone: Not on file    Gets together: Not on file    Attends religious service: Not on file    Active member of club or organization: Not on file    Attends meetings of clubs or organizations: Not on file    Relationship status: Not on file  . Intimate partner violence:    Fear of current or ex partner: Not on file    Emotionally abused: Not on file    Physically abused: Not on file    Forced sexual activity: Not on file  Other Topics Concern  . Not on file  Social History Narrative  . Not on file    Family History  Problem Relation Age of  Onset  . Heart failure Mother        died from  . Hypertension Mother   . Heart attack Father        died from  . Hypertension Sister     ROS- All systems are reviewed and negative except as per the HPI above  Physical Exam: Vitals:   10/21/17 1448  BP: 118/76  Pulse: 95  Weight: 206 lb (93.4 kg)  Height: 5\' 5"  (1.651 m)   Wt Readings from Last 3 Encounters:  10/21/17 206 lb (93.4 kg)  10/08/17 211 lb 12.8 oz (96.1 kg)  09/30/17 223 lb 12.8 oz (101.5 kg)    Labs: Lab Results  Component Value Date   NA 141 10/08/2017   K 4.9 10/08/2017   CL 106 10/08/2017   CO2 30 10/08/2017   GLUCOSE 107 (H) 10/08/2017   BUN 16 10/08/2017   CREATININE 1.16 (H) 10/08/2017   CALCIUM 9.5 10/08/2017   PHOS 5.5 (H) 04/12/2017   MG 2.0 10/08/2017   Lab Results  Component Value Date   INR 1.64 04/12/2017   Lab Results  Component Value Date   CHOL 135 09/02/2017   HDL 46 (L) 09/02/2017   LDLCALC 74 09/02/2017   TRIG 74 09/02/2017     GEN- The patient is well appearing, alert and oriented x 3 today.   Head- normocephalic, atraumatic Eyes-  Sclera clear, conjunctiva pink Ears- hearing intact Oropharynx- clear Neck- supple, no JVP Lymph- no cervical lymphadenopathy Lungs- Clear to ausculation bilaterally, normal work of breathing Heart- irregular rate and rhythm, no murmurs, rubs or gallops, PMI not laterally displaced GI- soft, NT, ND, + BS Extremities- no clubbing, cyanosis, or edema MS- no significant deformity or atrophy Skin- no rash or lesion Psych- euthymic mood, full affect Neuro- strength and sensation are intact  EKG- afib at 95 bpm, Qrs int 76 ms, Qtc 447 ms Epic records reviewed Echo-Study Conclusions  - Left ventricle: The cavity size was normal. Wall thickness was   increased in a pattern of mild LVH. Systolic function was   moderately reduced. The estimated ejection fraction was in the   range of 35% to 40%. Diffuse hypokinesis. Indeterminant diastolic    function (atrial fibrillation). - Aortic valve: There was no stenosis. - Mitral valve: Mildly calcified annulus. There was mild   regurgitation. - Left atrium: The atrium was moderately dilated. - Right ventricle: The cavity size was normal. Systolic function   was normal. - Right atrium: The atrium was mildly dilated. - Tricuspid valve: Peak RV-RA gradient (S): 24 mm Hg. - Systemic veins: IVC was not visualized.  Impressions:  - The patient was in atrial fibrillation. Normal LV size with mild   LV hypertrophy. EF 35-40%, diffuse hypokinesis. Normal RV size   and systolic function. Mild mitral regurgitation.    Assessment and Plan: 1. Persistent afib Off amiodarone and previous flecainde Now off anticoagualtion for pending surgery and further GI tests Reasonable rate control, continue metoprolol  Pt does want to restart anticoagulation when cleared and will plan for hospitalization for sotalol after 3 weeks of uninterrupted therapy  2.Chadsvasc score of 4 Pt was on eliquis 5 mg bid, now on hold H/o cirrhosis, esophageal varices   I will see her back in 6 weeks   Butch Penny C. Dagmawi Venable, [facility] [address] [place], [place] [address] [phone]

## 2017-10-22 ENCOUNTER — Other Ambulatory Visit: Payer: Self-pay

## 2017-10-22 ENCOUNTER — Encounter (HOSPITAL_COMMUNITY)
Admission: RE | Admit: 2017-10-22 | Discharge: 2017-10-22 | Disposition: A | Discharge status: Home / Self Care | Payer: PPO | Source: Ambulatory Visit | Attending: Urology | Admitting: Urology

## 2017-10-22 ENCOUNTER — Encounter (HOSPITAL_COMMUNITY): Payer: Self-pay

## 2017-10-22 DIAGNOSIS — N202 Calculus of kidney with calculus of ureter: Secondary | ICD-10-CM | POA: Insufficient documentation

## 2017-10-22 DIAGNOSIS — Z01818 Encounter for other preprocedural examination: Secondary | ICD-10-CM | POA: Diagnosis not present

## 2017-10-22 HISTORY — DX: Gastro-esophageal reflux disease without esophagitis: K21.9

## 2017-10-22 HISTORY — DX: Personal history of urinary calculi: Z87.442

## 2017-10-22 HISTORY — DX: Hypothyroidism, unspecified: E03.9

## 2017-10-22 HISTORY — DX: Cardiac arrhythmia, unspecified: I49.9

## 2017-10-22 LAB — CBC
HCT: 42.7 % (ref 36.0–46.0)
Hemoglobin: 14.5 g/dL (ref 12.0–15.0)
MCH: 31.4 pg (ref 26.0–34.0)
MCHC: 34 g/dL (ref 30.0–36.0)
MCV: 92.4 fL (ref 78.0–100.0)
PLATELETS: 384 10*3/uL (ref 150–400)
RBC: 4.62 MIL/uL (ref 3.87–5.11)
RDW: 16 % — AB (ref 11.5–15.5)
WBC: 6.1 10*3/uL (ref 4.0–10.5)

## 2017-10-26 ENCOUNTER — Encounter (HOSPITAL_COMMUNITY)
Admission: RE | Disposition: A | Discharge status: Home / Self Care | Payer: Self-pay | Source: Ambulatory Visit | Attending: Urology

## 2017-10-26 ENCOUNTER — Ambulatory Visit (HOSPITAL_COMMUNITY)
Admission: RE | Admit: 2017-10-26 | Discharge: 2017-10-26 | Disposition: A | Discharge status: Home / Self Care | Payer: PPO | Source: Ambulatory Visit | Attending: Urology | Admitting: Urology

## 2017-10-26 ENCOUNTER — Encounter (HOSPITAL_COMMUNITY): Payer: Self-pay | Admitting: Emergency Medicine

## 2017-10-26 ENCOUNTER — Ambulatory Visit (HOSPITAL_COMMUNITY): Payer: PPO | Admitting: Anesthesiology

## 2017-10-26 ENCOUNTER — Ambulatory Visit (HOSPITAL_COMMUNITY): Payer: PPO

## 2017-10-26 DIAGNOSIS — K219 Gastro-esophageal reflux disease without esophagitis: Secondary | ICD-10-CM | POA: Diagnosis not present

## 2017-10-26 DIAGNOSIS — Z888 Allergy status to other drugs, medicaments and biological substances status: Secondary | ICD-10-CM | POA: Diagnosis not present

## 2017-10-26 DIAGNOSIS — N200 Calculus of kidney: Secondary | ICD-10-CM

## 2017-10-26 DIAGNOSIS — Z6833 Body mass index (BMI) 33.0-33.9, adult: Secondary | ICD-10-CM | POA: Diagnosis not present

## 2017-10-26 DIAGNOSIS — I110 Hypertensive heart disease with heart failure: Secondary | ICD-10-CM | POA: Insufficient documentation

## 2017-10-26 DIAGNOSIS — K766 Portal hypertension: Secondary | ICD-10-CM | POA: Diagnosis not present

## 2017-10-26 DIAGNOSIS — K7460 Unspecified cirrhosis of liver: Secondary | ICD-10-CM | POA: Diagnosis not present

## 2017-10-26 DIAGNOSIS — J45909 Unspecified asthma, uncomplicated: Secondary | ICD-10-CM | POA: Diagnosis not present

## 2017-10-26 DIAGNOSIS — N202 Calculus of kidney with calculus of ureter: Secondary | ICD-10-CM | POA: Diagnosis not present

## 2017-10-26 DIAGNOSIS — K7581 Nonalcoholic steatohepatitis (NASH): Secondary | ICD-10-CM | POA: Insufficient documentation

## 2017-10-26 DIAGNOSIS — G4730 Sleep apnea, unspecified: Secondary | ICD-10-CM | POA: Insufficient documentation

## 2017-10-26 DIAGNOSIS — M1990 Unspecified osteoarthritis, unspecified site: Secondary | ICD-10-CM | POA: Insufficient documentation

## 2017-10-26 DIAGNOSIS — I509 Heart failure, unspecified: Secondary | ICD-10-CM | POA: Diagnosis not present

## 2017-10-26 DIAGNOSIS — Z79899 Other long term (current) drug therapy: Secondary | ICD-10-CM | POA: Diagnosis not present

## 2017-10-26 DIAGNOSIS — I4891 Unspecified atrial fibrillation: Secondary | ICD-10-CM | POA: Diagnosis not present

## 2017-10-26 DIAGNOSIS — I340 Nonrheumatic mitral (valve) insufficiency: Secondary | ICD-10-CM | POA: Diagnosis not present

## 2017-10-26 DIAGNOSIS — Z882 Allergy status to sulfonamides status: Secondary | ICD-10-CM | POA: Diagnosis not present

## 2017-10-26 DIAGNOSIS — E039 Hypothyroidism, unspecified: Secondary | ICD-10-CM | POA: Insufficient documentation

## 2017-10-26 DIAGNOSIS — R011 Cardiac murmur, unspecified: Secondary | ICD-10-CM | POA: Insufficient documentation

## 2017-10-26 DIAGNOSIS — Z881 Allergy status to other antibiotic agents status: Secondary | ICD-10-CM | POA: Insufficient documentation

## 2017-10-26 DIAGNOSIS — E669 Obesity, unspecified: Secondary | ICD-10-CM | POA: Diagnosis not present

## 2017-10-26 DIAGNOSIS — K3189 Other diseases of stomach and duodenum: Secondary | ICD-10-CM | POA: Diagnosis not present

## 2017-10-26 DIAGNOSIS — I5042 Chronic combined systolic (congestive) and diastolic (congestive) heart failure: Secondary | ICD-10-CM | POA: Diagnosis not present

## 2017-10-26 HISTORY — PX: CYSTOSCOPY/URETEROSCOPY/HOLMIUM LASER/STENT PLACEMENT: SHX6546

## 2017-10-26 SURGERY — CYSTOSCOPY/URETEROSCOPY/HOLMIUM LASER/STENT PLACEMENT
Anesthesia: General | Laterality: Left

## 2017-10-26 MED ORDER — FENTANYL CITRATE (PF) 100 MCG/2ML IJ SOLN
INTRAMUSCULAR | Status: AC
  Filled 2017-10-26: qty 2

## 2017-10-26 MED ORDER — DEXAMETHASONE SODIUM PHOSPHATE 10 MG/ML IJ SOLN
INTRAMUSCULAR | Status: AC
  Filled 2017-10-26: qty 1

## 2017-10-26 MED ORDER — SODIUM CHLORIDE 0.9 % IR SOLN
Status: DC | PRN
  Administered 2017-10-26: 6000 mL

## 2017-10-26 MED ORDER — 0.9 % SODIUM CHLORIDE (POUR BTL) OPTIME
TOPICAL | Status: DC | PRN
  Administered 2017-10-26: 1000 mL

## 2017-10-26 MED ORDER — ONDANSETRON HCL 4 MG/2ML IJ SOLN
INTRAMUSCULAR | Status: AC
  Filled 2017-10-26: qty 2

## 2017-10-26 MED ORDER — PHENYLEPHRINE 40 MCG/ML (10ML) SYRINGE FOR IV PUSH (FOR BLOOD PRESSURE SUPPORT)
PREFILLED_SYRINGE | INTRAVENOUS | Status: DC | PRN
  Administered 2017-10-26: 80 ug via INTRAVENOUS
  Administered 2017-10-26: 120 ug via INTRAVENOUS

## 2017-10-26 MED ORDER — IOHEXOL 300 MG/ML  SOLN
INTRAMUSCULAR | Status: DC | PRN
  Administered 2017-10-26: 27 mL

## 2017-10-26 MED ORDER — ONDANSETRON HCL 4 MG/2ML IJ SOLN: 4.0000 mg | Freq: Once | INTRAMUSCULAR | Status: DC | PRN

## 2017-10-26 MED ORDER — CEPHALEXIN 500 MG PO CAPS: 500.0000 mg | ORAL_CAPSULE | Freq: Three times a day (TID) | ORAL | 0 refills | Status: DC

## 2017-10-26 MED ORDER — LIDOCAINE 2% (20 MG/ML) 5 ML SYRINGE
INTRAMUSCULAR | Status: DC | PRN
  Administered 2017-10-26: 100 mg via INTRAVENOUS

## 2017-10-26 MED ORDER — PHENYLEPHRINE 40 MCG/ML (10ML) SYRINGE FOR IV PUSH (FOR BLOOD PRESSURE SUPPORT)
PREFILLED_SYRINGE | INTRAVENOUS | Status: AC
  Filled 2017-10-26: qty 10

## 2017-10-26 MED ORDER — CEFAZOLIN SODIUM-DEXTROSE 2-4 GM/100ML-% IV SOLN
2.0000 g | INTRAVENOUS | Status: AC
  Administered 2017-10-26: 2 g via INTRAVENOUS
  Filled 2017-10-26: qty 100

## 2017-10-26 MED ORDER — PROPOFOL 10 MG/ML IV BOLUS
INTRAVENOUS | Status: AC
  Filled 2017-10-26: qty 20

## 2017-10-26 MED ORDER — LACTATED RINGERS IV SOLN
INTRAVENOUS | Status: DC
  Administered 2017-10-26: 10:00:00 via INTRAVENOUS

## 2017-10-26 MED ORDER — MIDAZOLAM HCL 2 MG/2ML IJ SOLN
INTRAMUSCULAR | Status: AC
  Filled 2017-10-26: qty 2

## 2017-10-26 MED ORDER — DEXAMETHASONE SODIUM PHOSPHATE 10 MG/ML IJ SOLN
INTRAMUSCULAR | Status: DC | PRN
  Administered 2017-10-26: 10 mg via INTRAVENOUS

## 2017-10-26 MED ORDER — PROPOFOL 10 MG/ML IV BOLUS
INTRAVENOUS | Status: DC | PRN
  Administered 2017-10-26: 100 mg via INTRAVENOUS

## 2017-10-26 MED ORDER — HYDROCODONE-ACETAMINOPHEN 5-325 MG PO TABS: 1.0000 | ORAL_TABLET | ORAL | 0 refills | Status: DC | PRN

## 2017-10-26 MED ORDER — FENTANYL CITRATE (PF) 100 MCG/2ML IJ SOLN: 25.0000 ug | INTRAMUSCULAR | Status: DC | PRN

## 2017-10-26 MED ORDER — ONDANSETRON HCL 4 MG/2ML IJ SOLN
INTRAMUSCULAR | Status: DC | PRN
  Administered 2017-10-26: 4 mg via INTRAVENOUS

## 2017-10-26 MED ORDER — METOPROLOL SUCCINATE ER 50 MG PO TB24
150.0000 mg | ORAL_TABLET | Freq: Once | ORAL | Status: AC
  Administered 2017-10-26: 150 mg via ORAL
  Filled 2017-10-26: qty 1

## 2017-10-26 MED ORDER — FENTANYL CITRATE (PF) 100 MCG/2ML IJ SOLN
INTRAMUSCULAR | Status: DC | PRN
  Administered 2017-10-26: 25 ug via INTRAVENOUS

## 2017-10-26 SURGICAL SUPPLY — 22 items
BAG URO CATCHER STRL LF (MISCELLANEOUS) ×3 IMPLANT
BASKET ZERO TIP NITINOL 2.4FR (BASKET) IMPLANT
BSKT STON RTRVL ZERO TP 2.4FR (BASKET)
CATH URET 5FR 28IN OPEN ENDED (CATHETERS) ×2 IMPLANT
CATH URET DUAL LUMEN 6-10FR 50 (CATHETERS) ×1 IMPLANT
CLOTH BEACON ORANGE TIMEOUT ST (SAFETY) ×3 IMPLANT
COVER FOOTSWITCH UNIV (MISCELLANEOUS) IMPLANT
COVER SURGICAL LIGHT HANDLE (MISCELLANEOUS) ×1 IMPLANT
FIBER LASER FLEXIVA 365 (UROLOGICAL SUPPLIES) IMPLANT
FIBER LASER TRAC TIP (UROLOGICAL SUPPLIES) IMPLANT
GLOVE BIO SURGEON STRL SZ7.5 (GLOVE) ×3 IMPLANT
GOWN STRL REUS W/TWL LRG LVL3 (GOWN DISPOSABLE) ×3 IMPLANT
GUIDEWIRE ANG ZIPWIRE 038X150 (WIRE) IMPLANT
GUIDEWIRE STR DUAL SENSOR (WIRE) ×4 IMPLANT
IV NS 1000ML (IV SOLUTION)
IV NS 1000ML BAXH (IV SOLUTION) ×1 IMPLANT
MANIFOLD NEPTUNE II (INSTRUMENTS) ×3 IMPLANT
PACK CYSTO (CUSTOM PROCEDURE TRAY) ×3 IMPLANT
SHEATH URETERAL 12FRX35CM (MISCELLANEOUS) IMPLANT
STENT CONTOUR 6FRX26X.038 (STENTS) ×2 IMPLANT
TUBING CONNECTING 10 (TUBING) ×2 IMPLANT
TUBING CONNECTING 10' (TUBING) ×1

## 2017-10-26 NOTE — Discharge Instructions (Signed)
Cystoscopy, Care After  Refer to this sheet in the next few weeks. These instructions provide you with information about caring for yourself after your procedure. Your health care provider may also give you more specific instructions. Your treatment has been planned according to current medical practices, but problems sometimes occur. Call your health care provider if you have any problems or questions after your procedure.  What can I expect after the procedure?  After the procedure, it is common to have:   Mild pain when you urinate. Pain should stop within a few minutes after you urinate. This may last for up to 1 week.   A small amount of blood in your urine for several days.   Feeling like you need to urinate but producing only a small amount of urine.    Follow these instructions at home:    Medicines   Take over-the-counter and prescription medicines only as told by your health care provider.   If you were prescribed an antibiotic medicine, take it as told by your health care provider. Do not stop taking the antibiotic even if you start to feel better.  General instructions     Return to your normal activities as told by your health care provider. Ask your health care provider what activities are safe for you.   Do not drive for 24 hours if you received a sedative.   Watch for any blood in your urine. If the amount of blood in your urine increases, call your health care provider.   Follow instructions from your health care provider about eating or drinking restrictions.   If a tissue sample was removed for testing (biopsy) during your procedure, it is your responsibility to get your test results. Ask your health care provider or the department performing the test when your results will be ready.   Drink enough fluid to keep your urine clear or pale yellow.   Keep all follow-up visits as told by your health care provider. This is important.  Contact a health care provider if:   You have pain that  gets worse or does not get better with medicine, especially pain when you urinate.   You have difficulty urinating.  Get help right away if:   You have more blood in your urine.   You have blood clots in your urine.   You have abdominal pain.   You have a fever or chills.   You are unable to urinate.  This information is not intended to replace advice given to you by your health care provider. Make sure you discuss any questions you have with your health care provider.  Document Released: 12/13/2004 Document Revised: 11/01/2015 Document Reviewed: 04/12/2015  [employer]vier Interactive Patient Education  2018 [employer]

## 2017-10-26 NOTE — H&P (View-Only) (Signed)
CC: I have blood in my urine.  HPI: Brittney Valentine is a 77 year-old female patient who was referred by Dr. Unk Pinto, MD who is here for blood in the urine.  She did see the blood in her urine. She has not seen blood clots.   She does not have a burning sensation when she urinates. She is not currently having trouble urinating.   She has had kidney stones. She is not having pain. She has not recently had unwanted weight loss.   Her last U/S or CT Scan was 09/09/2017.   The patient presents today for evaluation of gross hematuria. She experienced this while on Eliquis for approximately 1 month. Due to the hematuria, the Eliquis was was stopped, and her gross hematuria resolved. She was also treated for UTI in February 2019. Patient underwent a CT stone protocol in April 2019 which showed a distal left 10 mm ureteral stone and a 6 mm nonobstructing left lower pole stone. Left ureteral stone was also visible on CT in November 2018. however it was not read as a ureteral stone by the radiologist. She does have a history of one stone episode requiring ureteroscopy approximately 1 decade ago.   She currently is experiencing no symptoms. She has no gross hematuria. She has no dysuria or flank pain. Her urinalysis is without red blood cells.   She has never smoked.   Patient has multiple medical comorbidities including cirrhosis with ascites.     ALLERGIES: Sulfa Drugs    MEDICATIONS: Metoprolol Succinate 100 mg tablet, extended release 24 hr  Amlodipine Besylate 2.5 mg tablet  Furosemide 20 mg tablet  Ranitidine Hcl 300 mg tablet  Spironolactone 25 mg tablet  Vitamin D3     GU PSH: Hysterectomy - 1979    NON-GU PSH: Appendectomy - 1991    GU PMH: None     PMH Notes:  1898-06-09 00:00:00 - Note: Normal Routine History And Physical Senior Citizen (65-80)   NON-GU PMH: Atrial Fibrillation Cardiac murmur, unspecified Hypothyroidism Liver Disease    FAMILY HISTORY: Deceased -  Mother, Father   SOCIAL HISTORY: Marital Status: Single Preferred Language: English; Ethnicity: Not Hispanic Or Latino; Race: White Current Smoking Status: Patient has never smoked.   Tobacco Use Assessment Completed: Used Tobacco in last 30 days? Has never drank.  Does not drink caffeine. Patient's occupation is/was Retired.    REVIEW OF SYSTEMS:    GU Review Female:   Patient reports hard to postpone urination, get up at night to urinate, and leakage of urine. Patient denies frequent urination, burning /pain with urination, stream starts and stops, trouble starting your stream, have to strain to urinate, and being pregnant.  Gastrointestinal (Upper):   Patient reports nausea and indigestion/ heartburn. Patient denies vomiting.  Gastrointestinal (Lower):   Patient reports diarrhea. Patient denies constipation.  Constitutional:   Patient denies fever, night sweats, weight loss, and fatigue.  Skin:   Patient denies skin rash/ lesion and itching.  Eyes:   Patient denies blurred vision and double vision.  Ears/ Nose/ Throat:   Patient reports sinus problems. Patient denies sore throat.  Hematologic/Lymphatic:   Patient reports easy bruising. Patient denies swollen glands.  Cardiovascular:   Patient reports leg swelling. Patient denies chest pains.  Respiratory:   Patient denies cough and shortness of breath.  Endocrine:   Patient denies excessive thirst.  Musculoskeletal:   Patient denies back pain and joint pain.  Neurological:   Patient reports dizziness. Patient denies headaches.  Psychologic:   Patient denies depression and anxiety.   VITAL SIGNS:      10/06/2017 03:47 PM  Weight 210 lb / 95.25 kg  Height 65 in / 165.1 cm  BP 121/58 mmHg  Pulse 75 /min  Temperature 97.4 F / 36.3 C  BMI 34.9 kg/m   GU PHYSICAL EXAMINATION:    Cervix: S/P Hysterectomy  Uterus: S/P Hysterectomy   MULTI-SYSTEM PHYSICAL EXAMINATION:    Constitutional: Well-nourished. No physical deformities.  Normally developed. Good grooming.  Neck: Neck symmetrical, not swollen. Normal tracheal position.  Respiratory: No labored breathing, no use of accessory muscles. Normal breath sounds.  Cardiovascular: Regular rate and rhythm. No murmur, no gallop. Normal temperature, normal extremity pulses, no swelling, no varicosities.  Lymphatic: No enlargement of neck, axillae, groin.  Skin: No paleness, no jaundice, no cyanosis. No lesion, no ulcer, no rash.  Neurologic / Psychiatric: Oriented to time, oriented to place, oriented to person. No depression, no anxiety, no agitation.  Gastrointestinal: No mass, no tenderness, no rigidity, non obese abdomen.  Eyes: Normal conjunctivae. Normal eyelids.  Ears, Nose, Mouth, and Throat: Left ear no scars, no lesions, no masses. Right ear no scars, no lesions, no masses. Nose no scars, no lesions, no masses. Normal hearing. Normal lips.  Musculoskeletal: Normal gait and station of head and neck.     PAST DATA REVIEWED:  Source Of History:  Patient   PROCEDURES:          Urinalysis Dipstick Dipstick Cont'd Micro  Color: Amber Bilirubin: Neg WBC/hpf: 10 - 20/hpf  Appearance: Clear Ketones: Neg RBC/hpf: 0 - 2/hpf  Specific Gravity: 1.020 Blood: 2+ Bacteria: Rare (0-9/hpf)  pH: 6.5 Protein: 1+ Cystals: NS (Not Seen)  Glucose: Neg Urobilinogen: 1.0 Casts: NS (Not Seen)    Nitrites: Neg Trichomonas: Not Present    Leukocyte Esterase: 2+ Mucous: Not Present      Epithelial Cells: 6 - 10/hpf      Yeast: NS (Not Seen)      Sperm: Not Present    ASSESSMENT:      ICD-10 Details  1 GU:   Gross hematuria - R31.0   2   Renal and ureteral calculus - N20.2    PLAN:           Orders Labs Urine Culture          Document Letter(s):  Created for Unk Pinto, MD   Created for Patient: Clinical Summary    The patient and I talked about surgical treatment for their ureteral calculus.   The risks, benefits, and some of the possible complications of  surgical treatments including cystoscopy, ureteroscopy, renoscopy, laser lithotripsy, stent insertion and others were discussed with the patient. All questions were answered.  The patient gave fully informed consent to proceed with a ureteroscopy with or without laser lithotripsy with stone extraction for the treatment of their ureteral calculi.   The patient was given instructions to call for abdominal pain, pelvic pain, perirectal pain, nausea, vomiting, diarrhea, fever over 100 degrees F, chills, hematuria, or dysuria.        Notes:   1. Left ureteral stone and left lower pole renal stone  I discussed with the patient that her left ureteral stone has been there since November 2018. Since it is not causing her flank pain, I suspect that it has been there for some time. We did discuss the concern for permanent damage of her left kidney with left obstructing for an extended period of time.  Due to its size and how long it is present, I do not think it'll pass on its own. She is elected to undergo cystoscopy, left ureteroscopy, laser lithotripsy, left stent. We will also address her left lower stone burden the time of the operation.   2. Gross hematuria  Likely secondary to obstructive uropathy. However, we will ensure that there are no bladder abnormalities during her cystoscopy for the above procedure.

## 2017-10-26 NOTE — Anesthesia Preprocedure Evaluation (Addendum)
Anesthesia Evaluation  Patient identified by MRN, date of birth, ID band Patient awake    Reviewed: Allergy & Precautions, NPO status , Patient's Chart, lab work & pertinent test results, reviewed documented beta blocker date and time   Airway Mallampati: II  TM Distance: >3 FB Neck ROM: Full    Dental  (+) Teeth Intact, Dental Advisory Given Lower permanent dentures :   Pulmonary asthma , sleep apnea ,    Pulmonary exam normal breath sounds clear to auscultation       Cardiovascular hypertension, Pt. on home beta blockers and Pt. on medications (-) angina+CHF  (-) Past MI + dysrhythmias Atrial Fibrillation  Rhythm:Irregular Rate:Normal  Echo 04/14/17: Study Conclusions  - Left ventricle: The cavity size was normal. Wall thickness was increased in a pattern of mild LVH. Systolic function was moderately reduced. The estimated ejection fraction was in the range of 35% to 40%. Diffuse hypokinesis. Indeterminant diastolic function (atrial fibrillation). - Aortic valve: There was no stenosis. - Mitral valve: Mildly calcified annulus. There was mild   regurgitation. - Left atrium: The atrium was moderately dilated. - Right ventricle: The cavity size was normal. Systolic function was normal. - Right atrium: The atrium was mildly dilated. - Tricuspid valve: Peak RV-RA gradient (S): 24 mm Hg. - Systemic veins: IVC was not visualized.  Impressions:  - The patient was in atrial fibrillation. Normal LV size with mild LV hypertrophy. EF 35-40%, diffuse hypokinesis. Normal RV size and systolic function. Mild mitral regurgitation.   Neuro/Psych  Neuromuscular disease negative psych ROS   GI/Hepatic GERD  Medicated and Controlled,(+) Cirrhosis  ([facility])  Esophageal Varices    , Portal hypertensive gastropathy   Endo/Other  Hypothyroidism Obesity   Renal/GU negative Renal ROSNephrolithiasis      Musculoskeletal  (+) Arthritis ,  Osteoarthritis,    Abdominal   Peds  Hematology negative hematology ROS (+)   Anesthesia Other Findings Day of surgery medications reviewed with the patient.  Reproductive/Obstetrics                            Anesthesia Physical Anesthesia Plan  ASA: IV  Anesthesia Plan: General   Post-op Pain Management:    Induction: Intravenous  PONV Risk Score and Plan: 3 and Ondansetron and Dexamethasone  Airway Management Planned: LMA  Additional Equipment:   Intra-op Plan:   Post-operative Plan: Extubation in OR  Informed Consent: I have reviewed the patients History and Physical, chart, labs and discussed the procedure including the risks, benefits and alternatives for the proposed anesthesia with the patient or authorized representative who has indicated his/her understanding and acceptance.   Dental advisory given  Plan Discussed with: CRNA  Anesthesia Plan Comments:         Anesthesia Quick Evaluation

## 2017-10-26 NOTE — Interval H&P Note (Signed)
History and Physical Interval Note:  10/26/2017 10:03 AM  Brittney Valentine  has presented today for surgery, with the diagnosis of LEFT URETERAL / RENAL STONES  The various methods of treatment have been discussed with the patient and family. After consideration of risks, benefits and other options for treatment, the patient has consented to  Procedure(s) with comments: CYSTOSCOPY/URETEROSCOPY/HOLMIUM LASER/STENT PLACEMENT (Left) - NEEDS DIGITAL URETEROSCOPE as a surgical intervention .  The patient's history has been reviewed, patient examined, no change in status, stable for surgery.  I have reviewed the patient's chart and labs.  Questions were answered to the patient's satisfaction.     Nickie Retort

## 2017-10-26 NOTE — Progress Notes (Signed)
Pt states she takes 150mg  metoprolol daily. States last dose was 1000 yesterday morning, did not take this morning. Received verbal order for 150mg  metoprolol to be given prior to surgery by Dr. Gifford Shave.

## 2017-10-26 NOTE — H&P (Signed)
CC: I have blood in my urine.  HPI: Brittney Valentine is a 77 year-old female patient who was referred by Dr. Unk Pinto, MD who is here for blood in the urine.  She did see the blood in her urine. She has not seen blood clots.   She does not have a burning sensation when she urinates. She is not currently having trouble urinating.   She has had kidney stones. She is not having pain. She has not recently had unwanted weight loss.   Her last U/S or CT Scan was 09/09/2017.   The patient presents today for evaluation of gross hematuria. She experienced this while on Eliquis for approximately 1 month. Due to the hematuria, the Eliquis was was stopped, and her gross hematuria resolved. She was also treated for UTI in February 2019. Patient underwent a CT stone protocol in April 2019 which showed a distal left 10 mm ureteral stone and a 6 mm nonobstructing left lower pole stone. Left ureteral stone was also visible on CT in November 2018. however it was not read as a ureteral stone by the radiologist. She does have a history of one stone episode requiring ureteroscopy approximately 1 decade ago.   She currently is experiencing no symptoms. She has no gross hematuria. She has no dysuria or flank pain. Her urinalysis is without red blood cells.   She has never smoked.   Patient has multiple medical comorbidities including cirrhosis with ascites.     ALLERGIES: Sulfa Drugs    MEDICATIONS: Metoprolol Succinate 100 mg tablet, extended release 24 hr  Amlodipine Besylate 2.5 mg tablet  Furosemide 20 mg tablet  Ranitidine Hcl 300 mg tablet  Spironolactone 25 mg tablet  Vitamin D3     GU PSH: Hysterectomy - 1979    NON-GU PSH: Appendectomy - 1991    GU PMH: None     PMH Notes:  1898-06-09 00:00:00 - Note: Normal Routine History And Physical Senior Citizen (65-80)   NON-GU PMH: Atrial Fibrillation Cardiac murmur, unspecified Hypothyroidism Liver Disease    FAMILY HISTORY: Deceased -  Mother, Father   SOCIAL HISTORY: Marital Status: Single Preferred Language: English; Ethnicity: Not Hispanic Or Latino; Race: White Current Smoking Status: Patient has never smoked.   Tobacco Use Assessment Completed: Used Tobacco in last 30 days? Has never drank.  Does not drink caffeine. Patient's occupation is/was Retired.    REVIEW OF SYSTEMS:    GU Review Female:   Patient reports hard to postpone urination, get up at night to urinate, and leakage of urine. Patient denies frequent urination, burning /pain with urination, stream starts and stops, trouble starting your stream, have to strain to urinate, and being pregnant.  Gastrointestinal (Upper):   Patient reports nausea and indigestion/ heartburn. Patient denies vomiting.  Gastrointestinal (Lower):   Patient reports diarrhea. Patient denies constipation.  Constitutional:   Patient denies fever, night sweats, weight loss, and fatigue.  Skin:   Patient denies skin rash/ lesion and itching.  Eyes:   Patient denies blurred vision and double vision.  Ears/ Nose/ Throat:   Patient reports sinus problems. Patient denies sore throat.  Hematologic/Lymphatic:   Patient reports easy bruising. Patient denies swollen glands.  Cardiovascular:   Patient reports leg swelling. Patient denies chest pains.  Respiratory:   Patient denies cough and shortness of breath.  Endocrine:   Patient denies excessive thirst.  Musculoskeletal:   Patient denies back pain and joint pain.  Neurological:   Patient reports dizziness. Patient denies headaches.  Psychologic:   Patient denies depression and anxiety.   VITAL SIGNS:      10/06/2017 03:47 PM  Weight 210 lb / 95.25 kg  Height 65 in / 165.1 cm  BP 121/58 mmHg  Pulse 75 /min  Temperature 97.4 F / 36.3 C  BMI 34.9 kg/m   GU PHYSICAL EXAMINATION:    Cervix: S/P Hysterectomy  Uterus: S/P Hysterectomy   MULTI-SYSTEM PHYSICAL EXAMINATION:    Constitutional: Well-nourished. No physical deformities.  Normally developed. Good grooming.  Neck: Neck symmetrical, not swollen. Normal tracheal position.  Respiratory: No labored breathing, no use of accessory muscles. Normal breath sounds.  Cardiovascular: Regular rate and rhythm. No murmur, no gallop. Normal temperature, normal extremity pulses, no swelling, no varicosities.  Lymphatic: No enlargement of neck, axillae, groin.  Skin: No paleness, no jaundice, no cyanosis. No lesion, no ulcer, no rash.  Neurologic / Psychiatric: Oriented to time, oriented to place, oriented to person. No depression, no anxiety, no agitation.  Gastrointestinal: No mass, no tenderness, no rigidity, non obese abdomen.  Eyes: Normal conjunctivae. Normal eyelids.  Ears, Nose, Mouth, and Throat: Left ear no scars, no lesions, no masses. Right ear no scars, no lesions, no masses. Nose no scars, no lesions, no masses. Normal hearing. Normal lips.  Musculoskeletal: Normal gait and station of head and neck.     PAST DATA REVIEWED:  Source Of History:  Patient   PROCEDURES:          Urinalysis Dipstick Dipstick Cont'd Micro  Color: Amber Bilirubin: Neg WBC/hpf: 10 - 20/hpf  Appearance: Clear Ketones: Neg RBC/hpf: 0 - 2/hpf  Specific Gravity: 1.020 Blood: 2+ Bacteria: Rare (0-9/hpf)  pH: 6.5 Protein: 1+ Cystals: NS (Not Seen)  Glucose: Neg Urobilinogen: 1.0 Casts: NS (Not Seen)    Nitrites: Neg Trichomonas: Not Present    Leukocyte Esterase: 2+ Mucous: Not Present      Epithelial Cells: 6 - 10/hpf      Yeast: NS (Not Seen)      Sperm: Not Present    ASSESSMENT:      ICD-10 Details  1 GU:   Gross hematuria - R31.0   2   Renal and ureteral calculus - N20.2    PLAN:           Orders Labs Urine Culture          Document Letter(s):  Created for Unk Pinto, MD   Created for Patient: Clinical Summary    The patient and I talked about surgical treatment for their ureteral calculus.   The risks, benefits, and some of the possible complications of  surgical treatments including cystoscopy, ureteroscopy, renoscopy, laser lithotripsy, stent insertion and others were discussed with the patient. All questions were answered.  The patient gave fully informed consent to proceed with a ureteroscopy with or without laser lithotripsy with stone extraction for the treatment of their ureteral calculi.   The patient was given instructions to call for abdominal pain, pelvic pain, perirectal pain, nausea, vomiting, diarrhea, fever over 100 degrees F, chills, hematuria, or dysuria.        Notes:   1. Left ureteral stone and left lower pole renal stone  I discussed with the patient that her left ureteral stone has been there since November 2018. Since it is not causing her flank pain, I suspect that it has been there for some time. We did discuss the concern for permanent damage of her left kidney with left obstructing for an extended period of time.  Due to its size and how long it is present, I do not think it'll pass on its own. She is elected to undergo cystoscopy, left ureteroscopy, laser lithotripsy, left stent. We will also address her left lower stone burden the time of the operation.   2. Gross hematuria  Likely secondary to obstructive uropathy. However, we will ensure that there are no bladder abnormalities during her cystoscopy for the above procedure.

## 2017-10-26 NOTE — Op Note (Signed)
Date of procedure: 10/26/17  Preoperative diagnosis:  1. Left ureteral stone 2. Left renal stone  Postoperative diagnosis:  1. Impacted left ureteral stone 2. Left renal stone  Procedure: 1. Cystoscopy 2. Left ureteroscopy 3. Left retrograde pyelogram 4. Left ureteral stent placement 6 French by 26 cm the patient had a impacted 1 cm stone at the junction of the mid and distal ureter.  Surgeon: Baruch Gouty, MD  Anesthesia: General  Complications: None  Intraoperative findings: It is very difficult to even pass a sensor wire passed it.  Attempts to address his stone with ureteroscopy wer the tortuosity distal to the stone as well as the impacted nature.  In order to not cause further damage, she was temporized with a left ureteral stent.  Impossible due to   EBL: None  Specimens: None  Drains: 6 French by 26 cm left double-J ureteral stent  Disposition: Stable to the postanesthesia care unit  Indication for procedure: The patient is a 77 y.o. female with history of 1 cm left ureteral stone as well as approximately 5 cm and left lower pole stone who presents today for definitive management5.  After reviewing the management options for treatment, the patient elected to proceed with the above surgical procedure(s). We have discussed the potential benefits and risks of the procedure, side effects of the proposed treatment, the likelihood of the patient achieving the goals of the procedure, and any potential problems that might occur during the procedure or recuperation. Informed consent has been obtained.  Description of procedure: The patient was met in the preoperative area. All risks, benefits, and indications of the procedure were described in great detail. The patient consented to the procedure. Preoperative antibiotics were given. The patient was taken to the operative theater. General anesthesia was induced per the anesthesia service. The patient was then placed in the dorsal  lithotomy position and prepped and draped in the usual sterile fashion. A preoperative timeout was called.   A 21 French 30 degree cystoscope was inserted into the patient's bladder per urethra atraumatically.  The left ureteral orifice was identified and a sensor wire was advanced through it.  However the stone which was visible on fluoroscopy because the sensor wire curled back on itself and not advance forward.  For approximately 10 minutes with the aid of a dual lumen catheter, and attempts were made to place a sensor wire passed this ureteral obstruction.  The single-channel ureteroscope was then assembled and advanced into the left ureteral orifice.  Approximately 1 inch distal to the stone it was impossible to navigate the ureter.  It has been tortuous and the impacted stone made it difficult to proceed further.  At this point, in order not to cause undue damage, this was aborted.  A left retrograde pyelogram was obtained to identify the left collecting system.  The ureteroscope was then withdrawn.  The  cystoscope was assembled around the sensor wire.  A 6 French by 26 cm double-J ureteral stent was then advanced to level the renal pelvis and a sensor wire removed.  There is a curl seen in the patient's left renal pelvis on fluoroscopy and the urinary bladder under catheterization.  At this point the patient was awoke from anesthesia and transferred in stable condition to the postanesthesia care unit.  Plan: The patient will return in a couple weeks for second stage ureteroscopy after passive dilation with the ureteral stent.  Baruch Gouty, M.D.

## 2017-10-26 NOTE — Transfer of Care (Signed)
Immediate Anesthesia Transfer of Care Note  Patient: Brittney Valentine  Procedure(s) Performed: CYSTOSCOPY, URETEROSCOPY/RETROGRADE/STENT PLACEMENT (Left )  Patient Location: PACU  Anesthesia Type:General  Level of Consciousness: awake, alert  and oriented  Airway & Oxygen Therapy: Patient Spontanous Breathing and Patient connected to face mask oxygen  Post-op Assessment: Report given to RN and Post -op Vital signs reviewed and stable  Post vital signs: Reviewed and stable  Last Vitals:  Vitals Value Taken Time  BP 141/98 10/26/2017 12:51 PM  Temp    Pulse 86 10/26/2017 12:53 PM  Resp 16 10/26/2017 12:53 PM  SpO2 100 % 10/26/2017 12:53 PM  Vitals shown include unvalidated device data.  Last Pain:  Vitals:   10/26/17 1018  TempSrc:   PainSc: 0-No pain      Patients Stated Pain Goal: 4 (65/99/35 [id number])  Complications: No apparent anesthesia complications

## 2017-10-26 NOTE — Anesthesia Procedure Notes (Signed)
Procedure Name: LMA Insertion Date/Time: 10/26/2017 12:08 PM Performed by: [place] Eline Geng C, CRNA Pre-anesthesia Checklist: Patient identified, Emergency Drugs available, Suction available and Patient being monitored Patient Re-evaluated:Patient Re-evaluated prior to induction Oxygen Delivery Method: Circle system utilized Preoxygenation: Pre-oxygenation with 100% oxygen Induction Type: IV induction Ventilation: Mask ventilation without difficulty LMA: LMA inserted LMA Size: 4.0 Number of attempts: 1 Airway Equipment and Method: Bite block Placement Confirmation: positive ETCO2 Tube secured with: Tape Dental Injury: Teeth and Oropharynx as per pre-operative assessment

## 2017-10-27 ENCOUNTER — Encounter (HOSPITAL_COMMUNITY): Payer: Self-pay | Admitting: Urology

## 2017-10-27 NOTE — Anesthesia Postprocedure Evaluation (Signed)
Anesthesia Post Note  Patient: Brittney Valentine  Procedure(s) Performed: CYSTOSCOPY, URETEROSCOPY/RETROGRADE/STENT PLACEMENT (Left )     Patient location during evaluation: PACU Anesthesia Type: General Level of consciousness: awake and alert Pain management: pain level controlled Vital Signs Assessment: post-procedure vital signs reviewed and stable Respiratory status: spontaneous breathing, nonlabored ventilation, respiratory function stable and patient connected to nasal cannula oxygen Cardiovascular status: blood pressure returned to baseline and stable Postop Assessment: no apparent nausea or vomiting Anesthetic complications: no    Last Vitals:  Vitals:   10/26/17 1330 10/26/17 1408  BP: (!) 129/93 (!) 146/92  Pulse: 90 93  Resp: 19 18  Temp: (!) 36.3 C 36.6 C  SpO2: 97% 98%    Last Pain:  Vitals:   10/26/17 1408  TempSrc:   PainSc: 0-No pain                 Catalina Gravel

## 2017-10-29 ENCOUNTER — Ambulatory Visit (INDEPENDENT_AMBULATORY_CARE_PROVIDER_SITE_OTHER): Payer: PPO | Admitting: Internal Medicine

## 2017-10-29 ENCOUNTER — Encounter: Payer: Self-pay | Admitting: Internal Medicine

## 2017-10-29 VITALS — BP 136/86 | HR 100 | Temp 97.8°F | Resp 16 | Ht 65.0 in | Wt 208.0 lb

## 2017-10-29 DIAGNOSIS — I481 Persistent atrial fibrillation: Secondary | ICD-10-CM

## 2017-10-29 DIAGNOSIS — I4819 Other persistent atrial fibrillation: Secondary | ICD-10-CM

## 2017-10-29 DIAGNOSIS — I10 Essential (primary) hypertension: Secondary | ICD-10-CM

## 2017-10-29 DIAGNOSIS — H43811 Vitreous degeneration, right eye: Secondary | ICD-10-CM | POA: Diagnosis not present

## 2017-10-29 DIAGNOSIS — G453 Amaurosis fugax: Secondary | ICD-10-CM

## 2017-10-29 MED ORDER — ASPIRIN EC 81 MG PO TBEC: DELAYED_RELEASE_TABLET | ORAL | Status: DC

## 2017-10-29 MED ORDER — METOPROLOL SUCCINATE ER 100 MG PO TB24: ORAL_TABLET | ORAL | 1 refills | Status: DC

## 2017-10-29 NOTE — Patient Instructions (Signed)
Amaurosis Fugax  Amaurosis fugax is a condition in which you temporarily lose sight in one eye. The loss of vision in the affected eye may be total or partial. The vision loss usually lasts for only a few seconds or minutes before sight returns to normal. Occasionally, it may last for several hours. This condition is caused by interruption of blood flow in the artery that supplies blood to the retina. The retina is the part of your eye that contains the nerves needed for sight.  This condition can be a warning sign of a stroke. A stroke can result in permanent vision loss or loss of other body functions.  What are the causes?  This condition is caused by a loss or interruption of blood flow to the retinal artery. Causes for the change in blood flow include:   Atherosclerosis, which is a buildup of cholesterol and fats (plaque). If some of that plaque breaks off and gets into the bloodstream, it can travel (embolize) to other blood vessels, such as the retinal artery.   Diseases of the heart valves.   Certain diseases of the blood, such as sickle cell anemia and leukemia.   Blood clotting (coagulation) disorders.   Inflammation of the arteries (vasculitis).   An irregular heartbeat, such as atrial fibrillation.    What increases the risk?  The following factors may make you more likely to develop this condition:   Use of any tobacco products, including cigarettes, chewing tobacco, or electronic cigarettes.   Poorly controlled diabetes.   Heart disease.   High blood pressure (hypertension).   High cholesterol.   Excessive alcohol use.   Use of illegal drugs, especially cocaine.   Age. The risk increases with age.   Family history of stroke.    What are the signs or symptoms?  The main symptom of this condition is painless, sudden loss of vision in one eye. The vision loss often starts at the top and moves down, as if a curtain is being pulled down over your eye. This is usually followed by a quick  return of vision. However, symptoms may last for several hours. It is important to seek medical care right away even if your symptoms go away.  How is this diagnosed?  This condition is diagnosed by:   Medical history and physical exam.   Eye exam.   Carotid ultrasound. This checks to see if plaque has built up in the carotid arteries in your neck.   Magnetic resonance angiography (MRA). This checks the anatomy of the carotid artery and the branches that supply the brain. It looks for areas of blockage or disease.    You may also have blood tests, an electrocardiogram (ECG) to check your heart rhythm, and an echocardiogram (ECHO) to check your heart function.  How is this treated?  Emergency treatment for this condition may involve massaging the eyeball or using certain breathing techniques to remove or ease the blockage of the retinal artery. Other treatments for this condition focuses on reducing your risk of having a stroke in the future. This may include:   Medicines. You may be given medicines to control high blood pressure, manage diabetes, or manage cholesterol, if needed. Blood thinning medicines may also be prescribed.   Procedures to either remove plaque in your carotid arteries or widen carotid arteries that have become narrow due to plaque. These may include:  ? Carotid endarterectomy. This is a surgical procedure that removes plaque from the carotid artery.  ?   Carotid angioplasty and stenting. In this procedure, a thin tube with a balloon is placed into the artery and is then inflated to open the blocked portion of the artery. A stent can be placed to keep that section of the artery open.   Lifestyle changes to reduce your risk of having a stroke. These can include changing your diet and getting enough exercise.    It is possible that you may not need any immediate treatment.  Follow these instructions at home:  Eating and drinking   Eat a diet that includes five or more servings of fruits and  vegetables each day. This may reduce the risk of stroke. Certain diets may be recommended to deal with high blood pressure, high cholesterol, diabetes, or obesity.   A diet that is low in sodium is recommended to manage high blood pressure.   A high-fiber diet that is low in saturated fat, trans fat, and cholesterol may control cholesterol levels.   A low-carbohydrate, low-sugar diet is recommended to manage diabetes.   A reduced-calorie diet that is low in sodium, saturated fat, trans fat, and cholesterol is recommended to manage obesity.  Lifestyle   Maintain a healthy weight.   Stay physically active. It is recommended that you get at least 30 minutes of activity on most or all days.   Do not use any products that contain nicotine or tobacco, such as cigarettes and e-cigarettes. If you need help quitting, ask your health care provider.   Limit alcohol intake to no more than 1 drink a day for nonpregnant women and 2 drinks a day for men. One drink equals 12 oz of beer, 5 oz of wine, or 1 oz of hard liquor.   Do not abuse drugs.  General instructions   Take over-the-counter and prescription medicines only as told by your health care provider. Follow the directions carefully. Make sure that you understand all of your medicine instructions. Medicines may be used to control the risk factors of stroke.   Keep all follow-up visits as told by your health care provider. This is important.  Contact a health care provider if:   You lose vision in one eye or both eyes.  Get help right away if:   You have chest pain or an irregular heartbeat.   You have any symptoms of stroke. The acronym BEFAST is an easy way to remember the main warning signs of stroke.  ? B = Balance problems. Signs include dizziness, sudden trouble walking, or loss of balance.  ? E = Eye problems. This includes trouble seeing or a sudden change in vision.  ? F = Face changes. This includes sudden weakness or numbness of the face, or the  face or eyelid drooping to one side.  ? A = Arm weakness or numbness. This happens suddenly and usually on one side of the body.  ? S = Speech problems. This includes trouble speaking or trouble understanding.  ? T = Time. Time to call 911 or seek emergency care. Do not wait to see if symptoms will go away. Make note of the time your symptoms started.   Other signs of stroke may include:  ? A sudden, severe headache with no known cause.  ? Nausea or vomiting.  ? Seizure.  These symptoms may represent a serious problem that is an emergency. Do not wait to see if the symptoms will go away. Get medical help right away. Call your local emergency services (911 in the [place] Do   not drive yourself to the hospital.  Summary   Amaurosis fugax is a condition in which you temporarily lose your sight in one eye.   This condition can be a warning sign of a stroke. A stroke can result in permanent vision loss or loss of other body functions.   Seek medical care right away even if your symptoms go away.  This information is not intended to replace advice given to you by your health care provider. Make sure you discuss any questions you have with your health care provider.  Document Released: 03/04/2008 Document Revised: 05/29/2016 Document Reviewed: 05/29/2016  Elsevier Interactive Patient Education  2017 [employer]

## 2017-10-29 NOTE — Progress Notes (Signed)
Subjective:    Patient ID: Brittney Valentine, female    DOB: February 05, 1941, 77 y.o.   MRN: [id number]  HPI   This Nice 77 yo single WF with HTN, pAfib, ASHD, COPD, hypothyroidism  and NAFLD/Cirrhosis presented with  An episode last night of a "black film" lasting ~30 minutes over the lateral or temporal 1/2 of the Rt eye only.   BP was Nl & she hand no other sx's Apparently she's been off of her Eliquis in anticipation of a urologic instrumentation for a kidney stone. No Hx/o Migraine.   Medication Sig  . amLODipine  2.5 MG tablet TAKE ONE TABLET EVERY DAY  . VITAMIN D 1000 UNITS  Take 1,000 Units  2  times daily.   . furosemide  40 MG tablet Take 20 mg daily after lunch.  . NORCO 5-325 MG tablet Take 1 tab every 4  hours as needed for moderate pain.  Marland Kitchen levothyroxine  112 MCG tablet Takes 1 tab TThSS and 1.5 tab MWF  . metoprolol succ-XL 100 MG  Take 1.5 tablets (150mg ) by mouth daily   . ranitidine  300 MG tablet Take 1 tablet (300 mg total) at bedtime by mouth.  . Spironolactone 25 MG tablet Take 25 mg by mouth daily after lunch.   Allergies  Allergen Reactions  . Ciprofloxacin Swelling  . Sulfonamide Derivatives Other (See Comments)    See little dots, skin feels like its burning  . Robaxin [Methocarbamol] Palpitations  . Verapamil Palpitations   Past Medical History:  Diagnosis Date  . Acute UTI   . Anemia   . Blood transfusion without reported diagnosis   . Cirrhosis ([place])   . Degenerative disk disease    knees  . Diverticulosis   . Dysrhythmia   . GERD (gastroesophageal reflux disease)   . Hemorrhoids   . History of kidney stones   . Hypertension   . Hypothyroidism   . Obesity   . Paroxysmal atrial fibrillation ([facility])   . Partial small bowel obstruction ([place])   . Personal history of colonic polyps 03/25/2012   8 mm rectal adenoma 03/2012  . Portal hypertensive gastropathy ([place])   . Shingles   . Thyroid disease   . Varices, esophageal ([place])   . Zoster    Past  Surgical History:  Procedure Laterality Date  . ABDOMINAL HYSTERECTOMY    . APPENDECTOMY  1991  . CHOLECYSTECTOMY  2000  . COLON SURGERY    . COLON SURGERY    . COLONOSCOPY    . CYSTOSCOPY     10-26-17 budyzn  . CYSTOSCOPY/URETEROSCOPY/HOLMIUM LASER/STENT PLACEMENT Left 10/26/2017   Procedure: CYSTOSCOPY, URETEROSCOPY/RETROGRADE/STENT PLACEMENT;  Surgeon: Nickie Retort, MD;  Location: [facility];  Service: Urology;  Laterality: Left;  NEEDS DIGITAL URETEROSCOPE  . ESOPHAGOGASTRODUODENOSCOPY  multiple   Review of Systems  10 point systems review negative except as above.Also , c/o edema & constipation since on Amlodipine     Objective:   Physical Exam  BP 136/86   Pulse 100   Temp 97.8 F (36.6 C)   Resp 16   Ht 5\' 5"  (1.651 m)   Wt 208 lb (94.3 kg)   BMI 34.61 kg/m   HEENT - WNL. Neck - supple.  Car 2+ 2+  No Bruits Chest - Clear equal BS. Cor -  HS soft . irreg R & R w/o sig MGR. PP 1(+). 2+ ankle edema. MS- FROM w/o deformities.  Gait Nl. Neuro -  Nl w/o focal  abnormalities.    Assessment & Plan:   1. Amaurosis fugax of right eye  - LD bASA 81 mg - 2 tab now, then 1 daily - Carotid dopplers ordered - App't to see Dr Gershon Crane today  2. Persistent atrial fibrillation ([place])  3. Essential hypertension  - d/c Amlodipine  - In Metoprolol Succ-XR  100 mg to 2 tab qd  Over 20 minutes of exam, counseling, chart review and high complex critical decision making was performed

## 2017-10-30 ENCOUNTER — Ambulatory Visit (HOSPITAL_COMMUNITY)
Admission: RE | Admit: 2017-10-30 | Discharge: 2017-10-30 | Disposition: A | Discharge status: Home / Self Care | Payer: PPO | Source: Ambulatory Visit | Attending: Vascular Surgery | Admitting: Vascular Surgery

## 2017-10-30 DIAGNOSIS — I10 Essential (primary) hypertension: Secondary | ICD-10-CM | POA: Insufficient documentation

## 2017-10-30 DIAGNOSIS — G453 Amaurosis fugax: Secondary | ICD-10-CM | POA: Diagnosis not present

## 2017-11-05 ENCOUNTER — Other Ambulatory Visit: Payer: Self-pay

## 2017-11-05 ENCOUNTER — Other Ambulatory Visit: Payer: Self-pay | Admitting: Urology

## 2017-11-05 ENCOUNTER — Encounter (HOSPITAL_COMMUNITY): Payer: Self-pay | Admitting: *Deleted

## 2017-11-05 DIAGNOSIS — I10 Essential (primary) hypertension: Secondary | ICD-10-CM | POA: Diagnosis not present

## 2017-11-11 NOTE — Progress Notes (Signed)
LM on VM at [phone] for patient to be able to give a urine specimen when arriving at Short Stay at [facility] for surgery on 11/12/2017. LM on VM to call back with any questions to [phone].

## 2017-11-12 ENCOUNTER — Encounter (HOSPITAL_COMMUNITY): Payer: Self-pay

## 2017-11-12 ENCOUNTER — Ambulatory Visit (HOSPITAL_COMMUNITY): Payer: PPO

## 2017-11-12 ENCOUNTER — Encounter (HOSPITAL_COMMUNITY)
Admission: RE | Disposition: A | Discharge status: Home / Self Care | Payer: Self-pay | Source: Ambulatory Visit | Attending: Urology

## 2017-11-12 ENCOUNTER — Ambulatory Visit (HOSPITAL_COMMUNITY)
Admission: RE | Admit: 2017-11-12 | Discharge: 2017-11-12 | Disposition: A | Discharge status: Home / Self Care | Payer: PPO | Source: Ambulatory Visit | Attending: Urology | Admitting: Urology

## 2017-11-12 ENCOUNTER — Ambulatory Visit: Payer: PPO | Admitting: Internal Medicine

## 2017-11-12 DIAGNOSIS — G4733 Obstructive sleep apnea (adult) (pediatric): Secondary | ICD-10-CM | POA: Diagnosis not present

## 2017-11-12 DIAGNOSIS — Z881 Allergy status to other antibiotic agents status: Secondary | ICD-10-CM | POA: Insufficient documentation

## 2017-11-12 DIAGNOSIS — R011 Cardiac murmur, unspecified: Secondary | ICD-10-CM | POA: Insufficient documentation

## 2017-11-12 DIAGNOSIS — Z888 Allergy status to other drugs, medicaments and biological substances status: Secondary | ICD-10-CM | POA: Diagnosis not present

## 2017-11-12 DIAGNOSIS — K219 Gastro-esophageal reflux disease without esophagitis: Secondary | ICD-10-CM | POA: Diagnosis not present

## 2017-11-12 DIAGNOSIS — N201 Calculus of ureter: Secondary | ICD-10-CM | POA: Insufficient documentation

## 2017-11-12 DIAGNOSIS — N135 Crossing vessel and stricture of ureter without hydronephrosis: Secondary | ICD-10-CM | POA: Insufficient documentation

## 2017-11-12 DIAGNOSIS — E039 Hypothyroidism, unspecified: Secondary | ICD-10-CM | POA: Diagnosis not present

## 2017-11-12 DIAGNOSIS — Z882 Allergy status to sulfonamides status: Secondary | ICD-10-CM | POA: Insufficient documentation

## 2017-11-12 DIAGNOSIS — I340 Nonrheumatic mitral (valve) insufficiency: Secondary | ICD-10-CM | POA: Diagnosis not present

## 2017-11-12 DIAGNOSIS — I5042 Chronic combined systolic (congestive) and diastolic (congestive) heart failure: Secondary | ICD-10-CM | POA: Diagnosis not present

## 2017-11-12 DIAGNOSIS — G4730 Sleep apnea, unspecified: Secondary | ICD-10-CM | POA: Diagnosis not present

## 2017-11-12 DIAGNOSIS — I10 Essential (primary) hypertension: Secondary | ICD-10-CM | POA: Insufficient documentation

## 2017-11-12 DIAGNOSIS — Z90710 Acquired absence of both cervix and uterus: Secondary | ICD-10-CM | POA: Insufficient documentation

## 2017-11-12 DIAGNOSIS — N200 Calculus of kidney: Secondary | ICD-10-CM

## 2017-11-12 DIAGNOSIS — I4891 Unspecified atrial fibrillation: Secondary | ICD-10-CM | POA: Diagnosis not present

## 2017-11-12 DIAGNOSIS — N138 Other obstructive and reflux uropathy: Secondary | ICD-10-CM | POA: Diagnosis not present

## 2017-11-12 DIAGNOSIS — I110 Hypertensive heart disease with heart failure: Secondary | ICD-10-CM | POA: Diagnosis not present

## 2017-11-12 HISTORY — PX: CYSTOSCOPY/URETEROSCOPY/HOLMIUM LASER/STENT PLACEMENT: SHX6546

## 2017-11-12 LAB — BASIC METABOLIC PANEL
Anion gap: 6 (ref 5–15)
BUN: 20 mg/dL (ref 6–20)
CHLORIDE: 111 mmol/L (ref 101–111)
CO2: 24 mmol/L (ref 22–32)
CREATININE: 1.13 mg/dL — AB (ref 0.44–1.00)
Calcium: 9.2 mg/dL (ref 8.9–10.3)
GFR calc Af Amer: 54 mL/min — ABNORMAL LOW (ref >60)
GFR calc non Af Amer: 47 mL/min — ABNORMAL LOW (ref >60)
GLUCOSE: 107 mg/dL — AB (ref 65–99)
Potassium: 4.7 mmol/L (ref 3.5–5.1)
SODIUM: 141 mmol/L (ref 135–145)

## 2017-11-12 LAB — CBC
HCT: 41.4 % (ref 36.0–46.0)
Hemoglobin: 14 g/dL (ref 12.0–15.0)
MCH: 30.9 pg (ref 26.0–34.0)
MCHC: 33.8 g/dL (ref 30.0–36.0)
MCV: 91.4 fL (ref 78.0–100.0)
Platelets: 388 10*3/uL (ref 150–400)
RBC: 4.53 MIL/uL (ref 3.87–5.11)
RDW: 17.4 % — ABNORMAL HIGH (ref 11.5–15.5)
WBC: 7.4 10*3/uL (ref 4.0–10.5)

## 2017-11-12 SURGERY — CYSTOSCOPY/URETEROSCOPY/HOLMIUM LASER/STENT PLACEMENT
Anesthesia: General | Site: Urethra | Laterality: Left

## 2017-11-12 MED ORDER — DEXAMETHASONE SODIUM PHOSPHATE 4 MG/ML IJ SOLN
INTRAMUSCULAR | Status: DC | PRN
  Administered 2017-11-12: 8 mg via INTRAVENOUS

## 2017-11-12 MED ORDER — ONDANSETRON HCL 4 MG/2ML IJ SOLN
INTRAMUSCULAR | Status: AC
  Filled 2017-11-12: qty 2

## 2017-11-12 MED ORDER — PROPOFOL 10 MG/ML IV BOLUS
INTRAVENOUS | Status: AC
  Filled 2017-11-12: qty 20

## 2017-11-12 MED ORDER — SODIUM CHLORIDE 0.9 % IR SOLN
Status: DC | PRN
Start: 2017-11-12 — End: 2017-11-12
  Administered 2017-11-12: 12000 mL

## 2017-11-12 MED ORDER — BELLADONNA-OPIUM 16.2-30 MG RE SUPP
RECTAL | Status: AC
Start: 2017-11-12 — End: ?
  Filled 2017-11-12: qty 1

## 2017-11-12 MED ORDER — PHENYLEPHRINE 40 MCG/ML (10ML) SYRINGE FOR IV PUSH (FOR BLOOD PRESSURE SUPPORT)
PREFILLED_SYRINGE | INTRAVENOUS | Status: AC
  Filled 2017-11-12: qty 10

## 2017-11-12 MED ORDER — IOHEXOL 300 MG/ML  SOLN
INTRAMUSCULAR | Status: DC | PRN
  Administered 2017-11-12: 24 mL

## 2017-11-12 MED ORDER — PHENYLEPHRINE HCL 10 MG/ML IJ SOLN
INTRAMUSCULAR | Status: AC
  Filled 2017-11-12: qty 1

## 2017-11-12 MED ORDER — PROPOFOL 10 MG/ML IV BOLUS
INTRAVENOUS | Status: DC | PRN
  Administered 2017-11-12: 50 mg via INTRAVENOUS
  Administered 2017-11-12: 150 mg via INTRAVENOUS

## 2017-11-12 MED ORDER — ONDANSETRON HCL 4 MG/2ML IJ SOLN
INTRAMUSCULAR | Status: DC | PRN
  Administered 2017-11-12: 4 mg via INTRAVENOUS

## 2017-11-12 MED ORDER — PHENYLEPHRINE 40 MCG/ML (10ML) SYRINGE FOR IV PUSH (FOR BLOOD PRESSURE SUPPORT)
PREFILLED_SYRINGE | INTRAVENOUS | Status: DC | PRN
  Administered 2017-11-12 (×3): 80 ug via INTRAVENOUS

## 2017-11-12 MED ORDER — PHENAZOPYRIDINE HCL 200 MG PO TABS: 200.0000 mg | ORAL_TABLET | Freq: Three times a day (TID) | ORAL | 0 refills | Status: DC | PRN

## 2017-11-12 MED ORDER — FENTANYL CITRATE (PF) 100 MCG/2ML IJ SOLN
INTRAMUSCULAR | Status: DC | PRN
  Administered 2017-11-12 (×3): 25 ug via INTRAVENOUS

## 2017-11-12 MED ORDER — LACTATED RINGERS IV SOLN
INTRAVENOUS | Status: DC
  Administered 2017-11-12: 09:00:00 via INTRAVENOUS

## 2017-11-12 MED ORDER — SODIUM CHLORIDE 0.9 % IV SOLN
INTRAVENOUS | Status: DC | PRN
  Administered 2017-11-12: 25 ug/min via INTRAVENOUS

## 2017-11-12 MED ORDER — LIDOCAINE 2% (20 MG/ML) 5 ML SYRINGE
INTRAMUSCULAR | Status: DC | PRN
  Administered 2017-11-12: 100 mg via INTRAVENOUS

## 2017-11-12 MED ORDER — 0.9 % SODIUM CHLORIDE (POUR BTL) OPTIME
TOPICAL | Status: DC | PRN
  Administered 2017-11-12: 1000 mL

## 2017-11-12 MED ORDER — CEFAZOLIN SODIUM-DEXTROSE 2-4 GM/100ML-% IV SOLN
2.0000 g | INTRAVENOUS | Status: AC
  Administered 2017-11-12: 2 g via INTRAVENOUS
  Filled 2017-11-12: qty 100

## 2017-11-12 MED ORDER — LIDOCAINE 2% (20 MG/ML) 5 ML SYRINGE
INTRAMUSCULAR | Status: AC
  Filled 2017-11-12: qty 5

## 2017-11-12 MED ORDER — FENTANYL CITRATE (PF) 100 MCG/2ML IJ SOLN
INTRAMUSCULAR | Status: AC
  Filled 2017-11-12: qty 2

## 2017-11-12 SURGICAL SUPPLY — 22 items
BAG URO CATCHER STRL LF (MISCELLANEOUS) ×3 IMPLANT
BASKET ZERO TIP NITINOL 2.4FR (BASKET) ×2 IMPLANT
BSKT STON RTRVL ZERO TP 2.4FR (BASKET) ×1
CATH URET 5FR 28IN OPEN ENDED (CATHETERS) ×3 IMPLANT
CLOTH BEACON ORANGE TIMEOUT ST (SAFETY) ×3 IMPLANT
COVER FOOTSWITCH UNIV (MISCELLANEOUS) IMPLANT
COVER SURGICAL LIGHT HANDLE (MISCELLANEOUS) ×1 IMPLANT
EXTRACTOR STONE 1.7FRX115CM (UROLOGICAL SUPPLIES) IMPLANT
FIBER LASER FLEXIVA 365 (UROLOGICAL SUPPLIES) ×2 IMPLANT
GLOVE BIOGEL M STRL SZ7.5 (GLOVE) ×3 IMPLANT
GOWN STRL REUS W/TWL XL LVL3 (GOWN DISPOSABLE) ×3 IMPLANT
GUIDEWIRE ANG ZIPWIRE 038X150 (WIRE) IMPLANT
GUIDEWIRE STR DUAL SENSOR (WIRE) ×3 IMPLANT
MANIFOLD NEPTUNE II (INSTRUMENTS) ×3 IMPLANT
PACK CYSTO (CUSTOM PROCEDURE TRAY) ×3 IMPLANT
SHEATH URETERAL 12FRX28CM (UROLOGICAL SUPPLIES) IMPLANT
SHEATH URETERAL 12FRX35CM (MISCELLANEOUS) IMPLANT
STENT URET 6FRX24 CONTOUR (STENTS) ×2 IMPLANT
TUBING CONNECTING 10 (TUBING) ×2 IMPLANT
TUBING CONNECTING 10' (TUBING) ×1
TUBING UROLOGY SET (TUBING) ×3 IMPLANT
WIRE COONS/BENSON .038X145CM (WIRE) IMPLANT

## 2017-11-12 NOTE — Anesthesia Preprocedure Evaluation (Addendum)
Anesthesia Evaluation  Patient identified by MRN, date of birth, ID band Patient awake    Reviewed: Allergy & Precautions, NPO status , Patient's Chart, lab work & pertinent test results  Airway Mallampati: I  TM Distance: >3 FB Neck ROM: Full    Dental   Pulmonary sleep apnea ,    Pulmonary exam normal        Cardiovascular hypertension, Pt. on medications Normal cardiovascular exam+ dysrhythmias Atrial Fibrillation   ECHO 04/2017 Study Conclusions  - Left ventricle: The cavity size was normal. Wall thickness was   increased in a pattern of mild LVH. Systolic function was   moderately reduced. The estimated ejection fraction was in the   range of 35% to 40%. Diffuse hypokinesis. Indeterminant diastolic   function (atrial fibrillation). - Aortic valve: There was no stenosis. - Mitral valve: Mildly calcified annulus. There was mild   regurgitation. - Left atrium: The atrium was moderately dilated. - Right ventricle: The cavity size was normal. Systolic function   was normal. - Right atrium: The atrium was mildly dilated. - Tricuspid valve: Peak RV-RA gradient (S): 24 mm Hg. - Systemic veins: IVC was not visualized.  Impressions:  - The patient was in atrial fibrillation. Normal LV size with mild   LV hypertrophy. EF 35-40%, diffuse hypokinesis. Normal RV size   and systolic function. Mild mitral regurgitation.    Neuro/Psych    GI/Hepatic GERD  Medicated and Controlled,  Endo/Other    Renal/GU      Musculoskeletal   Abdominal   Peds  Hematology   Anesthesia Other Findings   Reproductive/Obstetrics                            Anesthesia Physical Anesthesia Plan  ASA: III  Anesthesia Plan: General   Post-op Pain Management:    Induction: Intravenous  PONV Risk Score and Plan: 3 and Ondansetron, Midazolam and Treatment may vary due to age or medical condition  Airway  Management Planned: LMA  Additional Equipment:   Intra-op Plan:   Post-operative Plan: Extubation in OR  Informed Consent: I have reviewed the patients History and Physical, chart, labs and discussed the procedure including the risks, benefits and alternatives for the proposed anesthesia with the patient or authorized representative who has indicated his/her understanding and acceptance.     Plan Discussed with: CRNA and Surgeon  Anesthesia Plan Comments:         Anesthesia Quick Evaluation

## 2017-11-12 NOTE — Interval H&P Note (Signed)
History and Physical Interval Note: The patient presents today for completion ureteroscopy after  undergoing a stent for an impacted and possible stone.  We discussed the procedure in detail including the associated risks and benefits, and the patient has agreed to proceed.  There are no additional changes to her history and physical other than what is stated within it.  11/12/2017 10:33 AM  Brittney Valentine  has presented today for surgery, with the diagnosis of LEFT URETERAL / RENAL STONES  The various methods of treatment have been discussed with the patient and family. After consideration of risks, benefits and other options for treatment, the patient has consented to  Procedure(s): CYSTOSCOPY/URETEROSCOPY/HOLMIUM LASER/STENT EXCHANGE (Left) as a surgical intervention .  The patient's history has been reviewed, patient examined, no change in status, stable for surgery.  I have reviewed the patient's chart and labs.  Questions were answered to the patient's satisfaction.     Louis Meckel W

## 2017-11-12 NOTE — Anesthesia Postprocedure Evaluation (Signed)
Anesthesia Post Note  Patient: Brittney Valentine  Procedure(s) Performed: CYSTOSCOPY LEFT RETROGRADE PYELOGRAM LEFT URETEROSCOPY HOLMIUM LASER AND LEFT URETERAL STENT EXCHANGE AND LASER ENDO URETEROTOMY (Left Urethra)     Patient location during evaluation: PACU Anesthesia Type: General Level of consciousness: awake and alert Pain management: pain level controlled Vital Signs Assessment: post-procedure vital signs reviewed and stable Respiratory status: spontaneous breathing, nonlabored ventilation, respiratory function stable and patient connected to nasal cannula oxygen Cardiovascular status: blood pressure returned to baseline and stable Postop Assessment: no apparent nausea or vomiting Anesthetic complications: no    Last Vitals:  Vitals:   11/12/17 1245 11/12/17 1253  BP: 105/84 112/71  Pulse: (!) 107 (!) 114  Resp: 16 20  Temp:    SpO2: 93% 93%    Last Pain:  Vitals:   11/12/17 1245  TempSrc:   PainSc: 0-No pain                 Cougar Imel DAVID

## 2017-11-12 NOTE — Discharge Instructions (Signed)
DISCHARGE INSTRUCTIONS FOR KIDNEY STONE/URETERAL STENT   MEDICATIONS:  1. Resume all your other meds from home - except do not take any extra narcotic pain meds that you may have at home.  2. Pyridium is to help with the burning/stinging when you urinate.    ACTIVITY:  1. No strenuous activity x 1week  2. No driving while on narcotic pain medications  3. Drink plenty of water  4. Continue to walk at home - you can still get blood clots when you are at home, so keep active, but don't over do it.  5. May return to work/school tomorrow or when you feel ready   BATHING:  1. You can shower and we recommend daily showers  2. You have a string coming from your urethra: The stent string is attached to your ureteral stent. Do not pull on this.   SIGNS/SYMPTOMS TO CALL:  Please call us if you have a fever greater than 101.5, uncontrolled nausea/vomiting, uncontrolled pain, dizziness, unable to urinate, bloody urine, chest pain, shortness of breath, leg swelling, leg pain, redness around wound, drainage from wound, or any other concerns or questions.   You can reach [place] at [phone].   FOLLOW-UP:  1.  You have an appointment for stent removal in 2 week.

## 2017-11-12 NOTE — Transfer of Care (Signed)
Immediate Anesthesia Transfer of Care Note  Patient: Brittney Valentine  Procedure(s) Performed: CYSTOSCOPY LEFT RETROGRADE PYELOGRAM LEFT URETEROSCOPY HOLMIUM LASER AND LEFT URETERAL STENT EXCHANGE AND LASER ENDO URETEROTOMY (Left Urethra)  Patient Location: PACU  Anesthesia Type:General  Level of Consciousness: awake, alert  and oriented  Airway & Oxygen Therapy: Patient Spontanous Breathing and Patient connected to face mask oxygen  Post-op Assessment: Report given to RN and Post -op Vital signs reviewed and stable  Post vital signs: Reviewed and stable  Last Vitals:  Vitals Value Taken Time  BP 131/96 11/12/2017 12:22 PM  Temp 36.4 C 11/12/2017 12:22 PM  Pulse 107 11/12/2017 12:26 PM  Resp 18 11/12/2017 12:26 PM  SpO2 95 % 11/12/2017 12:26 PM  Vitals shown include unvalidated device data.  Last Pain:  Vitals:   11/12/17 1222  TempSrc:   PainSc: 0-No pain         Complications: No apparent anesthesia complications

## 2017-11-12 NOTE — Op Note (Signed)
Preoperative diagnosis:  1. Left mid/distal ureteral stone 2. Left distal ureteral stricture  Postoperative diagnosis:  1. Same  Procedure: 1. Cystoscopy, left retrograde pyelogram with interpretation 2. Left ureteroscopy, laser lithotripsy and stone extraction 3. Left laser ureterotomy  4. left ureteral stent exchange  Surgeon: Ardis Hughs, MD  Anesthesia: General  Complications: None  Intraoperative findings:  #1: The retrograde pyelogram demonstrated a tortuous ureter with a distinct narrowing or stricture in the distal aspect 4 cm above the bladder.  The filling defect was noted just proximal to this. #2: The patient's stone was fragmented and removed in pieces. #3: The stricture was incised with the laser, partial thickness. 4.:  24 cm x 6 French double-J stent was placed in the left ureter.  EBL: Minimal  Specimens: None  Indication: Brittney Valentine is a 77 y.o. patient who presented with an obstructing stone.  She was taken the OR several weeks ago and the impacted stone was impossible and as such a stent was placed.  She presents today for further treatment.   After reviewing the management options for treatment, he elected to proceed with the above surgical procedure(s). We have discussed the potential benefits and risks of the procedure, side effects of the proposed treatment, the likelihood of the patient achieving the goals of the procedure, and any potential problems that might occur during the procedure or recuperation. Informed consent has been obtained.   Description of procedure:  The patient was taken to the operating room and general anesthesia was induced.  The patient was placed in the dorsal lithotomy position, prepped and draped in the usual sterile fashion, and preoperative antibiotics were administered. A preoperative time-out was performed.   21 French 30 degrees cystoscope was gently passed through the patient's urethra and the bladder under  visual guidance.  The stent was grasped from the left ureteral orifice and pulled to the urethral meatus.  I was unable to advance the 0.038 sensor wire through the stent and does such replaced the cystoscope and advanced the wire alongside the stent.  I then advanced a 5 [place] open-ended catheter over the wire to the mid ureter and performed retrograde pyelogram demonstrating a tortuous ureter.  I slowly pulled back the catheter to image the distal part of the ureter and the stricture was noted as above.  I then rewired the open-ended catheter and remove the catheter over the wire.  I then removed the stent.  Using a 4/6 French semirigid ureteroscope by advanced it to the patient's urethra and cannulated the left ureteral orifice.  I got to the area of the stricture and was unable to get past it with the scope.  I then used a second wire and was able to advance beyond the stricture.  The stone was then encountered.  I grasped the stone with the basket and was unable to pull it down any further.  I then opted to proceed up into the mid ureter and used a 365 m fiber and laser the stone into numerous smaller fragments.  Using a 0 tipped basket the stone fragments were then removed.  At this point, the stricture was noted to be impeding the removal of the stone fragments and as such I opted to use the laser and at the 5 o'clock position and a partial thickness ureterotomy.  This opened up the ureter enough so that I was able to pass small stone fragments beyond it.  Once all the stone fragments had been removed I  passed the scope up to the proximal ureter noted no additional stone fragments.  I then removed the scope.  I advanced the 5 [place] open-ended catheter over the safety wire and performed a retrograde pyelogram noting that this area in the distal ureter had been opened up.  I then repassed the wire and under fluoroscopic guidance advanced a 6 French x24 cm over the wire and into the left renal pelvis.  Once  the stent was up into the renal pelvis I was advanced it to the urethral meatus and with gentle pressure on the stent remove the wire.  I then advanced a cystoscope she is to drain the bladder persistent into the patient's bladder.  Confirmation of a coronal bladder was obtained with fluoroscopy.  Patient was subsequently extubated return to the PACU in stable condition.  Ardis Hughs, M.D.

## 2017-11-12 NOTE — Anesthesia Procedure Notes (Signed)
Procedure Name: LMA Insertion Date/Time: 11/12/2017 11:04 AM Performed by: Maxwell Caul, CRNA Pre-anesthesia Checklist: Patient identified, Emergency Drugs available, Suction available and Patient being monitored Patient Re-evaluated:Patient Re-evaluated prior to induction Oxygen Delivery Method: Circle system utilized Preoxygenation: Pre-oxygenation with 100% oxygen Induction Type: IV induction LMA: LMA inserted LMA Size: 4.0 Number of attempts: 1 Placement Confirmation: positive ETCO2 and breath sounds checked- equal and bilateral Tube secured with: Tape Dental Injury: Teeth and Oropharynx as per pre-operative assessment  Comments: SRNA placement

## 2017-11-13 ENCOUNTER — Encounter (HOSPITAL_COMMUNITY): Payer: Self-pay | Admitting: Urology

## 2017-11-14 LAB — URINE CULTURE: Culture: >=100000 — AB

## 2017-11-19 DIAGNOSIS — N201 Calculus of ureter: Secondary | ICD-10-CM | POA: Diagnosis not present

## 2017-11-25 ENCOUNTER — Emergency Department (HOSPITAL_COMMUNITY)
Admission: EM | Admit: 2017-11-25 | Discharge: 2017-11-26 | Disposition: A | Discharge status: Home / Self Care | Payer: PPO | Attending: Emergency Medicine | Admitting: Emergency Medicine

## 2017-11-25 ENCOUNTER — Encounter (HOSPITAL_COMMUNITY): Payer: Self-pay | Admitting: Emergency Medicine

## 2017-11-25 ENCOUNTER — Encounter: Payer: Self-pay | Admitting: Internal Medicine

## 2017-11-25 ENCOUNTER — Other Ambulatory Visit: Payer: Self-pay

## 2017-11-25 ENCOUNTER — Ambulatory Visit (INDEPENDENT_AMBULATORY_CARE_PROVIDER_SITE_OTHER): Payer: PPO | Admitting: Internal Medicine

## 2017-11-25 VITALS — BP 100/80 | HR 84 | Temp 97.9°F | Resp 16 | Ht 65.0 in | Wt 206.8 lb

## 2017-11-25 DIAGNOSIS — I481 Persistent atrial fibrillation: Secondary | ICD-10-CM | POA: Diagnosis not present

## 2017-11-25 DIAGNOSIS — E861 Hypovolemia: Secondary | ICD-10-CM

## 2017-11-25 DIAGNOSIS — R1030 Lower abdominal pain, unspecified: Secondary | ICD-10-CM | POA: Insufficient documentation

## 2017-11-25 DIAGNOSIS — I10 Essential (primary) hypertension: Secondary | ICD-10-CM | POA: Diagnosis not present

## 2017-11-25 DIAGNOSIS — R42 Dizziness and giddiness: Secondary | ICD-10-CM

## 2017-11-25 DIAGNOSIS — R000 Tachycardia, unspecified: Secondary | ICD-10-CM | POA: Diagnosis not present

## 2017-11-25 DIAGNOSIS — I4819 Other persistent atrial fibrillation: Secondary | ICD-10-CM

## 2017-11-25 DIAGNOSIS — N390 Urinary tract infection, site not specified: Secondary | ICD-10-CM | POA: Diagnosis not present

## 2017-11-25 DIAGNOSIS — Z7982 Long term (current) use of aspirin: Secondary | ICD-10-CM | POA: Insufficient documentation

## 2017-11-25 DIAGNOSIS — R5383 Other fatigue: Secondary | ICD-10-CM | POA: Diagnosis present

## 2017-11-25 DIAGNOSIS — Z79899 Other long term (current) drug therapy: Secondary | ICD-10-CM | POA: Insufficient documentation

## 2017-11-25 DIAGNOSIS — I9589 Other hypotension: Secondary | ICD-10-CM

## 2017-11-25 DIAGNOSIS — E039 Hypothyroidism, unspecified: Secondary | ICD-10-CM | POA: Insufficient documentation

## 2017-11-25 DIAGNOSIS — I4891 Unspecified atrial fibrillation: Secondary | ICD-10-CM | POA: Diagnosis not present

## 2017-11-25 DIAGNOSIS — W19XXXA Unspecified fall, initial encounter: Secondary | ICD-10-CM | POA: Diagnosis not present

## 2017-11-25 DIAGNOSIS — R531 Weakness: Secondary | ICD-10-CM | POA: Insufficient documentation

## 2017-11-25 DIAGNOSIS — I420 Dilated cardiomyopathy: Secondary | ICD-10-CM | POA: Diagnosis not present

## 2017-11-25 LAB — URINALYSIS, ROUTINE W REFLEX MICROSCOPIC
BILIRUBIN URINE: NEGATIVE
Glucose, UA: NEGATIVE mg/dL
KETONES UR: NEGATIVE mg/dL
Nitrite: POSITIVE — AB
Protein, ur: 30 mg/dL — AB
Specific Gravity, Urine: 1.021 (ref 1.005–1.030)
pH: 6 (ref 5.0–8.0)

## 2017-11-25 LAB — COMPREHENSIVE METABOLIC PANEL
ALBUMIN: 2.4 g/dL — AB (ref 3.5–5.0)
ALT: 37 U/L (ref 14–54)
AST: 66 U/L — AB (ref 15–41)
Alkaline Phosphatase: 83 U/L (ref 38–126)
Anion gap: 7 (ref 5–15)
BUN: 33 mg/dL — AB (ref 6–20)
CHLORIDE: 107 mmol/L (ref 101–111)
CO2: 25 mmol/L (ref 22–32)
Calcium: 9.7 mg/dL (ref 8.9–10.3)
Creatinine, Ser: 1.17 mg/dL — ABNORMAL HIGH (ref 0.44–1.00)
GFR calc Af Amer: 52 mL/min — ABNORMAL LOW (ref >60)
GFR calc non Af Amer: 45 mL/min — ABNORMAL LOW (ref >60)
GLUCOSE: 97 mg/dL (ref 65–99)
POTASSIUM: 4.7 mmol/L (ref 3.5–5.1)
SODIUM: 139 mmol/L (ref 135–145)
Total Bilirubin: 2.7 mg/dL — ABNORMAL HIGH (ref 0.3–1.2)
Total Protein: 5.6 g/dL — ABNORMAL LOW (ref 6.5–8.1)

## 2017-11-25 LAB — CBC WITH DIFFERENTIAL/PLATELET
Basophils Absolute: 0.1 10*3/uL (ref 0.0–0.1)
Basophils Relative: 1 %
Eosinophils Absolute: 0.1 10*3/uL (ref 0.0–0.7)
Eosinophils Relative: 1 %
HEMATOCRIT: 45 % (ref 36.0–46.0)
HEMOGLOBIN: 14.7 g/dL (ref 12.0–15.0)
LYMPHS ABS: 1.2 10*3/uL (ref 0.7–4.0)
LYMPHS PCT: 9 %
MCH: 29.6 pg (ref 26.0–34.0)
MCHC: 32.7 g/dL (ref 30.0–36.0)
MCV: 90.7 fL (ref 78.0–100.0)
MONOS PCT: 14 %
Monocytes Absolute: 1.8 10*3/uL — ABNORMAL HIGH (ref 0.1–1.0)
NEUTROS ABS: 9.9 10*3/uL — AB (ref 1.7–7.7)
Neutrophils Relative %: 75 %
Platelets: 277 10*3/uL (ref 150–400)
RBC: 4.96 MIL/uL (ref 3.87–5.11)
RDW: 21.3 % — AB (ref 11.5–15.5)
WBC: 13.1 10*3/uL — ABNORMAL HIGH (ref 4.0–10.5)

## 2017-11-25 MED ORDER — SODIUM CHLORIDE 0.9 % IV SOLN
1.0000 g | Freq: Once | INTRAVENOUS | Status: AC
Start: 2017-11-25 — End: 2017-11-26
  Administered 2017-11-25: 1 g via INTRAVENOUS
  Filled 2017-11-25: qty 10

## 2017-11-25 MED ORDER — SODIUM CHLORIDE 0.9 % IV BOLUS: 1000.0000 mL | Freq: Once | INTRAVENOUS | Status: DC

## 2017-11-25 MED ORDER — SODIUM CHLORIDE 0.9 % IV SOLN
INTRAVENOUS | Status: DC
  Administered 2017-11-25: 23:00:00 via INTRAVENOUS

## 2017-11-25 NOTE — ED Triage Notes (Signed)
Pt to ED via [facility] from MD's office.  Pt st's she had a kidney stone removed on 6/6 and has just not felt well since then.  Pt st's she feels like she can't get her energy back.  Pt st's she drove herself to the MD's office today just because she didn't feel well.  Her MD did not think it would be safe for her to drive home and wanted her brought to ED for further testing

## 2017-11-25 NOTE — ED Provider Notes (Signed)
[facility] EMERGENCY DEPARTMENT Provider Note   CSN: [id number] Arrival date & time: 11/25/17  1739     History   Chief Complaint Chief Complaint  Patient presents with  . Fatigue    HPI Brittney Valentine is a 77 y.o. female.  HPI Is a concern of weakness, generalized discomfort. Patient has multiple medical issues, including cirrhosis, and recent urologic procedure for stent, and removal of a stone. She notes that since the procedure last month she has been doing generally poorly, with persistent weakness, without focal pain, though she has now developed some discomfort in her lower abdomen over the past day or so. No fever, no chills, no syncope. She did have one episode of syncope in the day after the procedure, but none since. No recent medication change, diet change, activity change. She went to her 52 office today, and was sent here for evaluation. Past Medical History:  Diagnosis Date  . Acute UTI   . Anemia   . Blood transfusion without reported diagnosis   . Cirrhosis ([place])   . Degenerative disk disease    knees  . Diverticulosis   . Dysrhythmia   . GERD (gastroesophageal reflux disease)   . Hemorrhoids   . History of kidney stones   . Hypertension   . Hypothyroidism   . Obesity   . Paroxysmal atrial fibrillation (HCC)   . Partial small bowel obstruction ([place])   . Personal history of colonic polyps 03/25/2012   8 mm rectal adenoma 03/2012  . Portal hypertensive gastropathy ([place])   . Shingles   . Thyroid disease   . Varices, esophageal ([place])   . Zoster     Patient Active Problem List   Diagnosis Date Noted  . Ascites 09/29/2017  . Chronic diarrhea 09/04/2017  . Long term current use of anticoagulant - apixaban 09/04/2017  . Fatigue 05/28/2017  . Chronic combined systolic and diastolic CHF (congestive heart failure) ([place])   . Dilated cardiomyopathy ([place])   . Contraindication to anticoagulation therapy   . Reactive airway  disease, mild intermittent, uncomplicated 44/81/8563  . Obstructive sleep apnea 01/08/2015  . Essential hypertension 12/21/2014  . Prediabetes 12/21/2014  . Hypothyroidism 12/21/2014  . Encounter for Medicare annual wellness exam 12/20/2014  . Medication management 09/12/2014  . Vitamin D deficiency 09/12/2014  . Atrial fibrillation ([place]) 05/11/2014  . Esophageal varices ([place]) 02/09/2014  . Left sided sciatica 02/09/2014  . History of colonic polyps 03/25/2012  . Obesity 09/27/2009  . History of small bowel obstruction 12/31/2007  . Hepatic cirrhosis ([place]) 12/31/2007  . PORTAL HYPERTENSION 12/31/2007    Past Surgical History:  Procedure Laterality Date  . ABDOMINAL HYSTERECTOMY    . APPENDECTOMY  1991  . CHOLECYSTECTOMY  2000  . COLON SURGERY    . COLON SURGERY    . COLONOSCOPY    . CYSTOSCOPY     10-26-17 budyzn  . CYSTOSCOPY/URETEROSCOPY/HOLMIUM LASER/STENT PLACEMENT Left 10/26/2017   Procedure: CYSTOSCOPY, URETEROSCOPY/RETROGRADE/STENT PLACEMENT;  Surgeon: Nickie Retort, MD;  Location: [facility];  Service: Urology;  Laterality: Left;  NEEDS DIGITAL URETEROSCOPE  . CYSTOSCOPY/URETEROSCOPY/HOLMIUM LASER/STENT PLACEMENT Left 11/12/2017   Procedure: CYSTOSCOPY LEFT RETROGRADE PYELOGRAM LEFT URETEROSCOPY HOLMIUM LASER AND LEFT URETERAL STENT EXCHANGE AND LASER ENDO URETEROTOMY;  Surgeon: Ardis Hughs, MD;  Location: [facility];  Service: Urology;  Laterality: Left;  . ESOPHAGOGASTRODUODENOSCOPY  multiple     OB History   None      Home Medications    Prior to  Admission medications   Medication Sig Start Date End Date Taking? Authorizing Provider  amoxicillin-clavulanate (AUGMENTIN) 875-125 MG tablet Take 1 tablet by mouth 2 (two) times daily. 11/19/17 11/26/17 Yes [provider]  aspirin EC 81 MG tablet Take 1 tablet daily Patient taking differently: Take 81 mg by mouth daily.  10/29/17  Yes Unk Pinto, MD  Cholecalciferol (VITAMIN D-3) 1000 UNITS CAPS  Take 1,000 Units by mouth 2 (two) times daily.    Yes [provider]  furosemide (LASIX) 20 MG tablet Take 20 mg by mouth daily after lunch.    Yes [provider]  levothyroxine (SYNTHROID, LEVOTHROID) 112 MCG tablet TAKE 1 TABLET BY MOUTH IN THE MORNING Patient taking differently: Take 168 mcg by mouth daily on Monday, Wednesday and Friday. Take 112 mcg by mouth daily on all other days 09/05/17  Yes Unk Pinto, MD  metoprolol succinate (TOPROL-XL) 100 MG 24 hr tablet Take 2 tablets  daily Patient taking differently: Take 150 mg by mouth daily.  10/29/17  Yes Unk Pinto, MD  ranitidine (ZANTAC) 300 MG tablet Take 1 tablet (300 mg total) at bedtime by mouth. Patient taking differently: Take 300 mg by mouth at bedtime as needed for heartburn.  04/22/17 04/22/18 Yes Vicie Mutters, PA-C  spironolactone (ALDACTONE) 25 MG tablet Take 25 mg by mouth daily.    Yes [provider]    Family History Family History  Problem Relation Age of Onset  . Heart failure Mother        died from  . Hypertension Mother   . Heart attack Father        died from  . Hypertension Sister     Social History Social History   Tobacco Use  . Smoking status: Never Smoker  . Smokeless tobacco: Never Used  Substance Use Topics  . Alcohol use: No  . Drug use: No     Allergies   Amoxicillin; Ciprofloxacin; Sulfonamide derivatives; Robaxin [methocarbamol]; and Verapamil   Review of Systems Review of Systems  Constitutional:       Per HPI, otherwise negative  HENT:       Per HPI, otherwise negative  Respiratory:       Per HPI, otherwise negative  Cardiovascular:       Per HPI, otherwise negative  Gastrointestinal: Negative for vomiting.  Endocrine:       Negative aside from HPI  Genitourinary:       Neg aside from HPI   Musculoskeletal:       Per HPI, otherwise negative  Skin: Negative.   Neurological: Positive for weakness. Negative for syncope.      Physical Exam Updated Vital Signs BP 116/61   Pulse (!) 107   Temp (!) 97.3 F (36.3 C) (Oral)   Resp 19   Ht 5\' 5"  (1.651 m)   Wt 93.4 kg (206 lb)   SpO2 98%   BMI 34.28 kg/m   Physical Exam  Constitutional: She is oriented to person, place, and time. She appears well-developed and well-nourished. No distress.  HENT:  Head: Normocephalic and atraumatic.  Eyes: Conjunctivae and EOM are normal.  Cardiovascular: Normal rate and regular rhythm.  Pulmonary/Chest: Effort normal and breath sounds normal. No stridor. No respiratory distress.  Abdominal: She exhibits no distension.  Musculoskeletal: She exhibits no edema.  Neurological: She is alert and oriented to person, place, and time. No cranial nerve deficit.  Skin: Skin is warm and dry.  Psychiatric: She has a normal mood and  affect.  Nursing note and vitals reviewed.    ED Treatments / Results  Labs (all labs ordered are listed, but only abnormal results are displayed) Labs Reviewed  COMPREHENSIVE METABOLIC PANEL - Abnormal; Notable for the following components:      Result Value   BUN 33 (*)    Creatinine, Ser 1.17 (*)    Total Protein 5.6 (*)    Albumin 2.4 (*)    AST 66 (*)    Total Bilirubin 2.7 (*)    GFR calc non Af Amer 45 (*)    GFR calc Af Amer 52 (*)    All other components within normal limits  CBC WITH DIFFERENTIAL/PLATELET - Abnormal; Notable for the following components:   WBC 13.1 (*)    RDW 21.3 (*)    Neutro Abs 9.9 (*)    Monocytes Absolute 1.8 (*)    All other components within normal limits  URINALYSIS, ROUTINE W REFLEX MICROSCOPIC    EKG None  Radiology No results found.  Procedures Procedures (including critical care time)  Medications Ordered in ED Medications  sodium chloride 0.9 % bolus 1,000 mL (1,000 mLs Intravenous Bolus 11/25/17 1922)     Initial Impression / Assessment and Plan / ED Course  I have reviewed the triage vital signs and the nursing  notes.  Pertinent labs & imaging results that were available during my care of the patient were reviewed by me and considered in my medical decision making (see chart for details).     9:53 PM Repeat exam the patient states that she feels somewhat better, receiving fluid resuscitation.  We discussed findings, including some concern for dehydration, and the patient acknowledges poor appetite since her procedure. She is otherwise stating she feels generally better, with no new complaints. Patient in acknowledges the importance of doing so has not yet provided a urine sample, to expedite her work-up. Patient is hemodynamically unremarkable, and states that she feels better, and there is suspicion for dehydration contributing to much of her discomfort. Patient will require repeat evaluation after urinalysis results are available.  PA Deborah Chalk will follow up on the patient's UA and dispo Final Clinical Impressions(s) / ED Diagnoses  Weakness   Carmin Muskrat, MD 11/25/17 2155

## 2017-11-25 NOTE — Progress Notes (Signed)
Subjective:    Patient ID: Brittney Valentine, female    DOB: 1941-06-08, 77 y.o.   MRN: [id number]  HPI   This Nice 77 yo single WF with HTN, pAfib, ASHD hx/o VTach,  COPD/OSA, hypothyroidism  and NAFLD/Cirrhosis w/portal HTN & esophageal varices presented today for evaluation and appeared very pale, diaphoretic and distressed relating that she was barely able to walk into the office from her car in the parking lot.  She underwent recent cysto/retrograde Lt ureteral stone extraction 13 days ago on 11/12/2017. She lives alone and for the 1st 24 hrs po, her brother stayed with her . Then alone she reports several episodes of falling and black-outs with periods of confusion ands amnesia mixing up days and is not sure of whether she's taken her meds correctly. She does recount a couple of episodes of awakening on the floor and was uncertain how long that she had laid there. She denies and focal weakness or lateralizing weakness.   Medication Sig  . aspirin EC 81 MG tablet Take 1 tablet daily (Patient taking differently: Take 81 mg by mouth daily. )  . Cholecalciferol (VITAMIN D-3) 1000 UNITS CAPS Take 1,000 Units by mouth 2 (two) times daily.   . furosemide (LASIX) 20 MG tablet Take 20 mg by mouth daily after lunch.   . levothyroxine (SYNTHROID, LEVOTHROID) 112 MCG tablet TAKE 1 TABLET BY MOUTH IN THE MORNING (Patient taking differently: Take 168 mcg by mouth daily on Monday, Wednesday and Friday. Take 112 mcg by mouth daily on all other days)  . metoprolol succinate (TOPROL-XL) 100 MG 24 hr tablet Take 2 tablets  daily (Patient taking differently: Take 150 mg by mouth daily. )  . ranitidine (ZANTAC) 300 MG tablet Take 1 tablet (300 mg total) at bedtime by mouth. (Patient taking differently: Take 300 mg by mouth at bedtime as needed for heartburn. )  . spironolactone (ALDACTONE) 25 MG tablet Take 25 mg by mouth daily.   Marland Kitchen HYDROcodone-acetaminophen (NORCO/VICODIN) 5-325 MG tablet Take 1 tablet by mouth every 4  (four) hours as needed for moderate pain. (Patient not taking: Reported on 11/06/2017)  . phenazopyridine (PYRIDIUM) 200 MG tablet Take 1 tablet (200 mg total) by mouth 3 (three) times daily as needed for pain.   No facility-administered medications prior to visit.    Allergies  Allergen Reactions  . Amoxicillin Palpitations  . Ciprofloxacin Swelling  . Sulfonamide Derivatives Other (See Comments)    See little dots, skin feels like its burning  . Robaxin [Methocarbamol] Palpitations  . Verapamil Palpitations   Past Medical History:  Diagnosis Date  . Acute UTI   . Anemia   . Blood transfusion without reported diagnosis   . Cirrhosis ([place])   . Degenerative disk disease    knees  . Diverticulosis   . Dysrhythmia   . GERD (gastroesophageal reflux disease)   . Hemorrhoids   . History of kidney stones   . Hypertension   . Hypothyroidism   . Obesity   . Paroxysmal atrial fibrillation (H[facility])   . Partial small bowel obstruction ([place])   . Personal history of colonic polyps 03/25/2012   8 mm rectal adenoma 03/2012  . Portal hypertensive gastropathy ([place])   . Shingles   . Thyroid disease   . Varices, esophageal ([place])   . Zoster    Past Surgical History:  Procedure Laterality Date  . ABDOMINAL HYSTERECTOMY    . APPENDECTOMY  1991  . CHOLECYSTECTOMY  2000  . COLON SURGERY    .  COLON SURGERY    . COLONOSCOPY    . CYSTOSCOPY     10-26-17 budyzn  . CYSTOSCOPY/URETEROSCOPY/HOLMIUM LASER/STENT PLACEMENT Left 10/26/2017   Procedure: CYSTOSCOPY, URETEROSCOPY/RETROGRADE/STENT PLACEMENT;  Surgeon: Nickie Retort, MD;  [facility];  Service: Urology;  Laterality: Left;  NEEDS DIGITAL URETEROSCOPE  . CYSTOSCOPY/URETEROSCOPY/HOLMIUM LASER/STENT PLACEMENT Left 11/12/2017   Procedure: CYSTOSCOPY LEFT RETROGRADE PYELOGRAM LEFT URETEROSCOPY HOLMIUM LASER AND LEFT URETERAL STENT EXCHANGE AND LASER ENDO URETEROTOMY;  Surgeon: Ardis Hughs, MD;  Location: [facility];  Service: Urology;   Laterality: Left;  . ESOPHAGOGASTRODUODENOSCOPY  multiple   Review of Systems  Review is not felt reliable with her seemingly mildly confused.    Objective:   Physical Exam  BP 100/80   Pulse 84   Temp 97.9 F (36.6 C)   Resp 16   Ht 5\' 5"  (1.651 m)   Wt 206 lb 12.8 oz (93.8 kg)   BMI 34.41 kg/m   Appears disheveled, clammy and very anxious. Pale.   HEENT - Eac's patent. TM's Nl. EOM's full. PERRLA. NasoOroPharynx clear. Neck - supple. Nl Thyroid. Carotids 2+ & No bruits, nodes, JVD Chest - Clear equal BS w/o rales, rhonchi, wheezes. Cor - Nl HS.  Sl irRR w/o sig m. PP 1(+). No edema. Abd - Soft w/o palpable organomegaly, masses or tenderness. BS nl. MS- FROM w/o deformities. Muscle power, tone and bulk Nl. Gait Nl. Neuro - No obvious Cr N abnormalities. No obvious focal lateralizing neuro signs.  Patient has difficulty standing from a sitting position w/o assistance.  Skin - exposed clear w/o rash cyanosis or icterus.    Assessment & Plan:   1. Hypotension due to hypovolemia  2. Weakness with dizziness  4. Persistent atrial fibrillation ([place])  5. Dilated cardiomyopathy ([place])  6. Hypothyroidism,  ++++++++++++++++++++++++++++    Patient was not felt safe to ambulate to her car, nor drive home and EMS was called for transport to the ER for evaluation

## 2017-11-26 MED ORDER — CEPHALEXIN 500 MG PO CAPS: 500.0000 mg | ORAL_CAPSULE | Freq: Two times a day (BID) | ORAL | 0 refills | Status: DC

## 2017-11-26 NOTE — Discharge Instructions (Signed)
Your found to have a urinary tract infection while in the ED today.  You were given a dose of IV antibiotics.  Continue Keflex as prescribed until finished.  Continue drinking plenty of water to prevent dehydration.  You may take Tylenol as needed for pain or low-grade fever.  Should you develop a fever over 101 F, nausea, vomiting, worsening weakness, abdominal pain, return to the emergency department for repeat evaluation.  You may also return for any other new or concerning symptoms.  Otherwise, follow-up with your primary care doctor in 3 days for recheck.

## 2017-11-26 NOTE — ED Provider Notes (Signed)
12:57 AM Patient care assumed from Dr. Vanita Panda at change of shift pending urinalysis with plan for discharge once resulted.  Patient presenting from PCP office for generalized weakness.  She has been hemodynamically stable, afebrile.  Urinalysis today does suggest UTI.  The patient has been treated with IV Rocephin as well as 2 L IV fluids.  Plan for discharge on Keflex.  Patient advised to follow-up with her primary care doctor for recheck.  Return precautions provided. Patient discharged in stable condition with no unaddressed concerns.   Vitals:   11/25/17 2300 11/25/17 2330 11/26/17 0000 11/26/17 0030  BP: (!) 135/59 (!) 103/56 110/61 120/82  Pulse:  (!) 104    Resp: (!) 21 17 20 18   Temp:      TempSrc:      SpO2: 98% 98%    Weight:      Height:        Results for orders placed or performed during the hospital encounter of 11/25/17  Urinalysis, Routine w reflex microscopic  Result Value Ref Range   Color, Urine AMBER (A) YELLOW   APPearance CLOUDY (A) CLEAR   Specific Gravity, Urine 1.021 1.005 - 1.030   pH 6.0 5.0 - 8.0   Glucose, UA NEGATIVE NEGATIVE mg/dL   Hgb urine dipstick MODERATE (A) NEGATIVE   Bilirubin Urine NEGATIVE NEGATIVE   Ketones, ur NEGATIVE NEGATIVE mg/dL   Protein, ur 30 (A) NEGATIVE mg/dL   Nitrite POSITIVE (A) NEGATIVE   Leukocytes, UA LARGE (A) NEGATIVE   RBC / HPF 21-50 0 - 5 RBC/hpf   WBC, UA >50 (H) 0 - 5 WBC/hpf   Bacteria, UA MANY (A) NONE SEEN   Squamous Epithelial / LPF 21-50 0 - 5   Mucus PRESENT   Comprehensive metabolic panel  Result Value Ref Range   Sodium 139 135 - 145 mmol/L   Potassium 4.7 3.5 - 5.1 mmol/L   Chloride 107 101 - 111 mmol/L   CO2 25 22 - 32 mmol/L   Glucose, Bld 97 65 - 99 mg/dL   BUN 33 (H) 6 - 20 mg/dL   Creatinine, Ser 1.17 (H) 0.44 - 1.00 mg/dL   Calcium 9.7 8.9 - 10.3 mg/dL   Total Protein 5.6 (L) 6.5 - 8.1 g/dL   Albumin 2.4 (L) 3.5 - 5.0 g/dL   AST 66 (H) 15 - 41 U/L   ALT 37 14 - 54 U/L   Alkaline  Phosphatase 83 38 - 126 U/L   Total Bilirubin 2.7 (H) 0.3 - 1.2 mg/dL   GFR calc non Af Amer 45 (L) >60 mL/min   GFR calc Af Amer 52 (L) >60 mL/min   Anion gap 7 5 - 15  CBC with Differential  Result Value Ref Range   WBC 13.1 (H) 4.0 - 10.5 K/uL   RBC 4.96 3.87 - 5.11 MIL/uL   Hemoglobin 14.7 12.0 - 15.0 g/dL   HCT 45.0 36.0 - 46.0 %   MCV 90.7 78.0 - 100.0 fL   MCH 29.6 26.0 - 34.0 pg   MCHC 32.7 30.0 - 36.0 g/dL   RDW 21.3 (H) 11.5 - 15.5 %   Platelets 277 150 - 400 K/uL   Neutrophils Relative % 75 %   Lymphocytes Relative 9 %   Monocytes Relative 14 %   Eosinophils Relative 1 %   Basophils Relative 1 %   Neutro Abs 9.9 (H) 1.7 - 7.7 K/uL   Lymphs Abs 1.2 0.7 - 4.0 K/uL   Monocytes Absolute  1.8 (H) 0.1 - 1.0 K/uL   Eosinophils Absolute 0.1 0.0 - 0.7 K/uL   Basophils Absolute 0.1 0.0 - 0.1 K/uL   Smear Review MORPHOLOGY UNREMARKABLE       Antonietta Breach, PA-C 11/26/17 0101    Blanchie Dessert, MD 11/26/17 [phone]

## 2017-11-28 LAB — URINE CULTURE: Culture: >=100000 — AB

## 2017-11-29 ENCOUNTER — Telehealth: Payer: Self-pay

## 2017-11-29 NOTE — Telephone Encounter (Signed)
Post ED Visit - Positive Culture Follow-up  Culture report reviewed by antimicrobial stewardship pharmacist:  []  Elenor Quinones, Pharm.D. []  Heide Guile, Pharm.D., BCPS AQ-ID []  Parks Neptune, Pharm.D., BCPS []  Alycia Rossetti, Pharm.D., BCPS []  [place], Pharm.D., BCPS, AAHIVP []  Legrand Como, Pharm.D., BCPS, AAHIVP []  Salome Arnt, PharmD, BCPS []  Wynell Balloon, PharmD []  Vincenza Hews, PharmD, BCPS Jimmy Footman Pharm D Positive urine culture Treated with Cephalexin, organism sensitive to the same and no further patient follow-up is required at this time.  Genia Del 11/29/2017, 10:02 AM

## 2017-12-03 ENCOUNTER — Ambulatory Visit (HOSPITAL_BASED_OUTPATIENT_CLINIC_OR_DEPARTMENT_OTHER)
Admission: RE | Admit: 2017-12-03 | Discharge: 2017-12-03 | Disposition: A | Discharge status: Home / Self Care | Payer: PPO | Source: Ambulatory Visit | Attending: Nurse Practitioner | Admitting: Nurse Practitioner

## 2017-12-03 ENCOUNTER — Ambulatory Visit (INDEPENDENT_AMBULATORY_CARE_PROVIDER_SITE_OTHER): Payer: PPO | Admitting: Internal Medicine

## 2017-12-03 ENCOUNTER — Encounter: Payer: Self-pay | Admitting: Internal Medicine

## 2017-12-03 ENCOUNTER — Encounter (HOSPITAL_COMMUNITY): Payer: Self-pay | Admitting: Nurse Practitioner

## 2017-12-03 VITALS — BP 110/82 | HR 92 | Temp 97.9°F | Resp 18 | Ht 65.0 in | Wt 213.8 lb

## 2017-12-03 VITALS — BP 106/72 | HR 106 | Ht 65.0 in | Wt 213.0 lb

## 2017-12-03 DIAGNOSIS — R627 Adult failure to thrive: Secondary | ICD-10-CM | POA: Diagnosis not present

## 2017-12-03 DIAGNOSIS — Z880 Allergy status to penicillin: Secondary | ICD-10-CM

## 2017-12-03 DIAGNOSIS — I340 Nonrheumatic mitral (valve) insufficiency: Secondary | ICD-10-CM | POA: Diagnosis not present

## 2017-12-03 DIAGNOSIS — Z8249 Family history of ischemic heart disease and other diseases of the circulatory system: Secondary | ICD-10-CM | POA: Insufficient documentation

## 2017-12-03 DIAGNOSIS — E039 Hypothyroidism, unspecified: Secondary | ICD-10-CM | POA: Insufficient documentation

## 2017-12-03 DIAGNOSIS — I8510 Secondary esophageal varices without bleeding: Secondary | ICD-10-CM

## 2017-12-03 DIAGNOSIS — R9431 Abnormal electrocardiogram [ECG] [EKG]: Secondary | ICD-10-CM | POA: Diagnosis not present

## 2017-12-03 DIAGNOSIS — I361 Nonrheumatic tricuspid (valve) insufficiency: Secondary | ICD-10-CM | POA: Diagnosis not present

## 2017-12-03 DIAGNOSIS — Z87442 Personal history of urinary calculi: Secondary | ICD-10-CM | POA: Insufficient documentation

## 2017-12-03 DIAGNOSIS — I481 Persistent atrial fibrillation: Secondary | ICD-10-CM

## 2017-12-03 DIAGNOSIS — M7989 Other specified soft tissue disorders: Secondary | ICD-10-CM

## 2017-12-03 DIAGNOSIS — K766 Portal hypertension: Secondary | ICD-10-CM

## 2017-12-03 DIAGNOSIS — R188 Other ascites: Secondary | ICD-10-CM | POA: Diagnosis not present

## 2017-12-03 DIAGNOSIS — Z79899 Other long term (current) drug therapy: Secondary | ICD-10-CM

## 2017-12-03 DIAGNOSIS — K7469 Other cirrhosis of liver: Secondary | ICD-10-CM | POA: Diagnosis not present

## 2017-12-03 DIAGNOSIS — I82412 Acute embolism and thrombosis of left femoral vein: Secondary | ICD-10-CM | POA: Diagnosis not present

## 2017-12-03 DIAGNOSIS — I10 Essential (primary) hypertension: Secondary | ICD-10-CM

## 2017-12-03 DIAGNOSIS — E669 Obesity, unspecified: Secondary | ICD-10-CM

## 2017-12-03 DIAGNOSIS — N183 Chronic kidney disease, stage 3 (moderate): Secondary | ICD-10-CM | POA: Diagnosis not present

## 2017-12-03 DIAGNOSIS — Z9889 Other specified postprocedural states: Secondary | ICD-10-CM | POA: Insufficient documentation

## 2017-12-03 DIAGNOSIS — Z881 Allergy status to other antibiotic agents status: Secondary | ICD-10-CM | POA: Insufficient documentation

## 2017-12-03 DIAGNOSIS — Z7901 Long term (current) use of anticoagulants: Secondary | ICD-10-CM | POA: Insufficient documentation

## 2017-12-03 DIAGNOSIS — J9811 Atelectasis: Secondary | ICD-10-CM | POA: Diagnosis not present

## 2017-12-03 DIAGNOSIS — I420 Dilated cardiomyopathy: Secondary | ICD-10-CM | POA: Diagnosis not present

## 2017-12-03 DIAGNOSIS — Z882 Allergy status to sulfonamides status: Secondary | ICD-10-CM | POA: Insufficient documentation

## 2017-12-03 DIAGNOSIS — K7460 Unspecified cirrhosis of liver: Secondary | ICD-10-CM

## 2017-12-03 DIAGNOSIS — I429 Cardiomyopathy, unspecified: Secondary | ICD-10-CM | POA: Diagnosis not present

## 2017-12-03 DIAGNOSIS — K3189 Other diseases of stomach and duodenum: Secondary | ICD-10-CM | POA: Insufficient documentation

## 2017-12-03 DIAGNOSIS — I480 Paroxysmal atrial fibrillation: Secondary | ICD-10-CM

## 2017-12-03 DIAGNOSIS — R0602 Shortness of breath: Secondary | ICD-10-CM | POA: Diagnosis not present

## 2017-12-03 DIAGNOSIS — Z888 Allergy status to other drugs, medicaments and biological substances status: Secondary | ICD-10-CM | POA: Insufficient documentation

## 2017-12-03 DIAGNOSIS — I2699 Other pulmonary embolism without acute cor pulmonale: Secondary | ICD-10-CM | POA: Diagnosis not present

## 2017-12-03 DIAGNOSIS — I82402 Acute embolism and thrombosis of unspecified deep veins of left lower extremity: Secondary | ICD-10-CM | POA: Diagnosis not present

## 2017-12-03 DIAGNOSIS — I4819 Other persistent atrial fibrillation: Secondary | ICD-10-CM

## 2017-12-03 DIAGNOSIS — I472 Ventricular tachycardia: Secondary | ICD-10-CM | POA: Diagnosis not present

## 2017-12-03 DIAGNOSIS — K5790 Diverticulosis of intestine, part unspecified, without perforation or abscess without bleeding: Secondary | ICD-10-CM | POA: Diagnosis not present

## 2017-12-03 DIAGNOSIS — Z90710 Acquired absence of both cervix and uterus: Secondary | ICD-10-CM

## 2017-12-03 DIAGNOSIS — N3000 Acute cystitis without hematuria: Secondary | ICD-10-CM | POA: Diagnosis not present

## 2017-12-03 DIAGNOSIS — Z9049 Acquired absence of other specified parts of digestive tract: Secondary | ICD-10-CM | POA: Insufficient documentation

## 2017-12-03 DIAGNOSIS — I82890 Acute embolism and thrombosis of other specified veins: Secondary | ICD-10-CM | POA: Diagnosis not present

## 2017-12-03 DIAGNOSIS — I447 Left bundle-branch block, unspecified: Secondary | ICD-10-CM | POA: Insufficient documentation

## 2017-12-03 DIAGNOSIS — K7581 Nonalcoholic steatohepatitis (NASH): Secondary | ICD-10-CM | POA: Diagnosis not present

## 2017-12-03 DIAGNOSIS — Z6836 Body mass index (BMI) 36.0-36.9, adult: Secondary | ICD-10-CM | POA: Diagnosis not present

## 2017-12-03 DIAGNOSIS — I130 Hypertensive heart and chronic kidney disease with heart failure and stage 1 through stage 4 chronic kidney disease, or unspecified chronic kidney disease: Secondary | ICD-10-CM | POA: Diagnosis not present

## 2017-12-03 DIAGNOSIS — I82432 Acute embolism and thrombosis of left popliteal vein: Secondary | ICD-10-CM | POA: Diagnosis not present

## 2017-12-03 DIAGNOSIS — I5042 Chronic combined systolic (congestive) and diastolic (congestive) heart failure: Secondary | ICD-10-CM | POA: Diagnosis not present

## 2017-12-03 DIAGNOSIS — K219 Gastro-esophageal reflux disease without esophagitis: Secondary | ICD-10-CM

## 2017-12-03 DIAGNOSIS — I272 Pulmonary hypertension, unspecified: Secondary | ICD-10-CM | POA: Diagnosis not present

## 2017-12-03 DIAGNOSIS — I4891 Unspecified atrial fibrillation: Secondary | ICD-10-CM | POA: Diagnosis not present

## 2017-12-03 NOTE — Patient Instructions (Signed)
Increase Lasix to 20mg  twice a day until we see you in follow up

## 2017-12-03 NOTE — Progress Notes (Signed)
Subjective:    Patient ID: Brittney Valentine, female    DOB: Mar 19, 1941, 77 y.o.   MRN: [id number]  HPI  This nice 77 yo WF with multiple medical co-morbidities   ((HTN, pAfib, ASHD hx/o VTach,  COPD/OSA, hypothyroidism and NAFLD/Cirrhosis w/portal HTN & esophageal varices))   returns for 1 week f/u after seen in the office and was referred to the ER  for evaluation where she she was found dehydrated and given IVF and treated for UTI with iv Rocephin and released at about 1:30 am with a Rx for Keflex and she took a taxi to get her car in our parking lot and drove home.  ER labs di show BUN & Creat were increased. Her U/C returned (+) for Klebsiella  Sensitive to the Keflex which she completed 2 days ago. She denies any GI, Respiratory orGU sx's. She is c/o of increased edema of her legs L>R and was advised via the A Fib clinic to increase her Lasix to BID  And she's scheduled for a venous doppler of the LLE. She does relate when she takes Lasix 20 mg that she does NOT experience a significant diuresis.   Medication Sig  . Cholecalciferol (VITAMIN D-3) 1000 UNITS CAPS Take 1,000 Units by mouth 2 (two) times daily.   . furosemide (LASIX) 20 MG tablet Take 20 mg by mouth 2 (two) times daily.  Marland Kitchen levothyroxine (SYNTHROID, LEVOTHROID) 112 MCG tablet TAKE 1 TABLET BY MOUTH IN THE MORNING (Patient taking differently: Take 168 mcg by mouth daily on Monday, Wednesday and Friday. Take 112 mcg by mouth daily on all other days)  . metoprolol succinate (TOPROL-XL) 100 MG 24 hr tablet Take 2 tablets  daily (Patient taking differently: Take 200 mg by mouth daily. )  . ranitidine (ZANTAC) 300 MG tablet Take 1 tablet (300 mg total) at bedtime by mouth. (Patient taking differently: Take 300 mg by mouth at bedtime as needed for heartburn. )   No facility-administered medications prior to visit.    Allergies  Allergen Reactions  . Amoxicillin Palpitations  . Ciprofloxacin Swelling  . Sulfonamide Derivatives Other (See  Comments)    See little dots, skin feels like its burning  . Robaxin [Methocarbamol] Palpitations  . Verapamil Palpitations   Past Medical History:  Diagnosis Date  . Acute UTI   . Anemia   . Blood transfusion without reported diagnosis   . Cirrhosis ([place])   . Degenerative disk disease    knees  . Diverticulosis   . Dysrhythmia   . GERD (gastroesophageal reflux disease)   . Hemorrhoids   . History of kidney stones   . Hypertension   . Hypothyroidism   . Obesity   . Paroxysmal atrial fibrillation ([facility])   . Partial small bowel obstruction ([place])   . Personal history of colonic polyps 03/25/2012   8 mm rectal adenoma 03/2012  . Portal hypertensive gastropathy ([place])   . Shingles   . Thyroid disease   . Varices, esophageal ([place])   . Zoster    Past Surgical History:  Procedure Laterality Date  . ABDOMINAL HYSTERECTOMY    . APPENDECTOMY  1991  . CHOLECYSTECTOMY  2000  . COLON SURGERY    . COLON SURGERY    . COLONOSCOPY    . CYSTOSCOPY     10-26-17 budyzn  . CYSTOSCOPY/URETEROSCOPY/HOLMIUM LASER/STENT PLACEMENT Left 10/26/2017   Procedure: CYSTOSCOPY, URETEROSCOPY/RETROGRADE/STENT PLACEMENT;  Surgeon: Nickie Retort, MD;  Location[facility];  Service: Urology;  Laterality: Left;  NEEDS DIGITAL URETEROSCOPE  . CYSTOSCOPY/URETEROSCOPY/HOLMIUM LASER/STENT PLACEMENT Left 11/12/2017   Procedure: CYSTOSCOPY LEFT RETROGRADE PYELOGRAM LEFT URETEROSCOPY HOLMIUM LASER AND LEFT URETERAL STENT EXCHANGE AND LASER ENDO URETEROTOMY;  Surgeon: Ardis Hughs, MD;  Location: [facility];  Service: Urology;  Laterality: Left;  . ESOPHAGOGASTRODUODENOSCOPY  multiple   Review of Systems    10 point systems review negative except as above.    Objective:   Physical Exam  BP 110/82   Pulse 92   Temp 97.9 F (36.6 C)   Resp 18   Ht 5\' 5"  (1.651 m)   Wt 213 lb 12.8 oz (97 kg)   BMI 35.58 kg/m    Postural sitting BP 101/73  P 89   And standing BP 117/81   P 58  Obese. Appears disheveled.  No stridor. No cyanosis, icterus or rash.   HEENT - Eac's patent. TM's Nl. EOM's full. PERRLA. NasoOroPharynx clear. Neck - supple. Nl Thyroid. Carotids 2+ & No bruits, nodes, JVD Chest - Clear equal BS w/o rales or rhonchi. Cor - HS oft w sl irreg  RR w/o sig m. PP obscured by assymetric LE edema  Lt>>Rt. (-) Homans. Decreased capillary refill. Abd - No palpable organomegaly, masses or tenderness. BS nl. MS- FROM w/o deformities. Muscle power, tone and bulk Nl. Gait Nl. Neuro - No obvious Cr N abnormalities. Sensory, motor and Cerebellar functions appear Nl w/o focal abnormalities. Psyche - Mental status normal & appropriate.  No delusions, ideations or obvious mood abnormalities. Skin - exposed clear w/o rash cyanosis or icterus.    Assessment & Plan:   1. Acute cystitis without hematuria  - CBC with Differential/Platelet - Urinalysis, Routine w reflex microscopic  2. Essential hypertension & Edema  - Advised to take 2 Lasix 20 mg = 40 mg to see if prompts a diuresis and advised monitor BP's 2 x/ day & ROV 1 week to reassess.   - COMPLETE METABOLIC PANEL WITH GFR  3. Medication management  - COMPLETE METABOLIC PANEL WITH GFR  Over 25 minutes of exam, counseling, chart review and  critical decision making was performed

## 2017-12-04 ENCOUNTER — Encounter (HOSPITAL_COMMUNITY): Payer: Self-pay | Admitting: *Deleted

## 2017-12-04 ENCOUNTER — Ambulatory Visit (HOSPITAL_BASED_OUTPATIENT_CLINIC_OR_DEPARTMENT_OTHER)
Admission: RE | Admit: 2017-12-04 | Discharge: 2017-12-04 | Disposition: A | Discharge status: Home / Self Care | Payer: PPO | Source: Ambulatory Visit | Attending: Nurse Practitioner | Admitting: Nurse Practitioner

## 2017-12-04 ENCOUNTER — Telehealth (HOSPITAL_COMMUNITY): Payer: Self-pay | Admitting: *Deleted

## 2017-12-04 ENCOUNTER — Inpatient Hospital Stay (HOSPITAL_COMMUNITY)
Admission: EM | Admit: 2017-12-04 | Discharge: 2017-12-06 | DRG: 176 | Disposition: A | Discharge status: Home Health | Payer: PPO | Attending: Internal Medicine | Admitting: Internal Medicine

## 2017-12-04 ENCOUNTER — Emergency Department (HOSPITAL_COMMUNITY): Payer: PPO

## 2017-12-04 ENCOUNTER — Other Ambulatory Visit: Payer: Self-pay

## 2017-12-04 DIAGNOSIS — I82812 Embolism and thrombosis of superficial veins of left lower extremities: Secondary | ICD-10-CM

## 2017-12-04 DIAGNOSIS — K766 Portal hypertension: Secondary | ICD-10-CM | POA: Diagnosis present

## 2017-12-04 DIAGNOSIS — Z86010 Personal history of colonic polyps: Secondary | ICD-10-CM

## 2017-12-04 DIAGNOSIS — K219 Gastro-esophageal reflux disease without esophagitis: Secondary | ICD-10-CM | POA: Diagnosis present

## 2017-12-04 DIAGNOSIS — I5042 Chronic combined systolic (congestive) and diastolic (congestive) heart failure: Secondary | ICD-10-CM | POA: Diagnosis present

## 2017-12-04 DIAGNOSIS — I472 Ventricular tachycardia: Secondary | ICD-10-CM | POA: Diagnosis present

## 2017-12-04 DIAGNOSIS — Z9049 Acquired absence of other specified parts of digestive tract: Secondary | ICD-10-CM

## 2017-12-04 DIAGNOSIS — Z8249 Family history of ischemic heart disease and other diseases of the circulatory system: Secondary | ICD-10-CM

## 2017-12-04 DIAGNOSIS — I82402 Acute embolism and thrombosis of unspecified deep veins of left lower extremity: Secondary | ICD-10-CM | POA: Diagnosis not present

## 2017-12-04 DIAGNOSIS — I82409 Acute embolism and thrombosis of unspecified deep veins of unspecified lower extremity: Secondary | ICD-10-CM | POA: Diagnosis present

## 2017-12-04 DIAGNOSIS — Z881 Allergy status to other antibiotic agents status: Secondary | ICD-10-CM

## 2017-12-04 DIAGNOSIS — I82412 Acute embolism and thrombosis of left femoral vein: Secondary | ICD-10-CM | POA: Insufficient documentation

## 2017-12-04 DIAGNOSIS — E039 Hypothyroidism, unspecified: Secondary | ICD-10-CM | POA: Diagnosis not present

## 2017-12-04 DIAGNOSIS — I82890 Acute embolism and thrombosis of other specified veins: Secondary | ICD-10-CM | POA: Diagnosis present

## 2017-12-04 DIAGNOSIS — K7469 Other cirrhosis of liver: Secondary | ICD-10-CM | POA: Diagnosis not present

## 2017-12-04 DIAGNOSIS — Z79890 Hormone replacement therapy (postmenopausal): Secondary | ICD-10-CM

## 2017-12-04 DIAGNOSIS — I10 Essential (primary) hypertension: Secondary | ICD-10-CM | POA: Diagnosis not present

## 2017-12-04 DIAGNOSIS — I4819 Other persistent atrial fibrillation: Secondary | ICD-10-CM

## 2017-12-04 DIAGNOSIS — J9811 Atelectasis: Secondary | ICD-10-CM | POA: Diagnosis present

## 2017-12-04 DIAGNOSIS — K7460 Unspecified cirrhosis of liver: Secondary | ICD-10-CM | POA: Diagnosis present

## 2017-12-04 DIAGNOSIS — I4891 Unspecified atrial fibrillation: Secondary | ICD-10-CM | POA: Diagnosis present

## 2017-12-04 DIAGNOSIS — I480 Paroxysmal atrial fibrillation: Secondary | ICD-10-CM | POA: Diagnosis present

## 2017-12-04 DIAGNOSIS — N182 Chronic kidney disease, stage 2 (mild): Secondary | ICD-10-CM | POA: Diagnosis present

## 2017-12-04 DIAGNOSIS — I82432 Acute embolism and thrombosis of left popliteal vein: Secondary | ICD-10-CM | POA: Diagnosis present

## 2017-12-04 DIAGNOSIS — I420 Dilated cardiomyopathy: Secondary | ICD-10-CM | POA: Diagnosis present

## 2017-12-04 DIAGNOSIS — R627 Adult failure to thrive: Secondary | ICD-10-CM | POA: Diagnosis present

## 2017-12-04 DIAGNOSIS — Z888 Allergy status to other drugs, medicaments and biological substances status: Secondary | ICD-10-CM

## 2017-12-04 DIAGNOSIS — I2699 Other pulmonary embolism without acute cor pulmonale: Secondary | ICD-10-CM | POA: Diagnosis present

## 2017-12-04 DIAGNOSIS — Z79899 Other long term (current) drug therapy: Secondary | ICD-10-CM

## 2017-12-04 DIAGNOSIS — Z882 Allergy status to sulfonamides status: Secondary | ICD-10-CM

## 2017-12-04 DIAGNOSIS — E669 Obesity, unspecified: Secondary | ICD-10-CM | POA: Diagnosis present

## 2017-12-04 DIAGNOSIS — I272 Pulmonary hypertension, unspecified: Secondary | ICD-10-CM | POA: Diagnosis present

## 2017-12-04 DIAGNOSIS — Z90710 Acquired absence of both cervix and uterus: Secondary | ICD-10-CM

## 2017-12-04 DIAGNOSIS — I481 Persistent atrial fibrillation: Secondary | ICD-10-CM

## 2017-12-04 DIAGNOSIS — Z6836 Body mass index (BMI) 36.0-36.9, adult: Secondary | ICD-10-CM

## 2017-12-04 DIAGNOSIS — K7581 Nonalcoholic steatohepatitis (NASH): Secondary | ICD-10-CM | POA: Diagnosis present

## 2017-12-04 DIAGNOSIS — I130 Hypertensive heart and chronic kidney disease with heart failure and stage 1 through stage 4 chronic kidney disease, or unspecified chronic kidney disease: Secondary | ICD-10-CM | POA: Diagnosis present

## 2017-12-04 DIAGNOSIS — Z87442 Personal history of urinary calculi: Secondary | ICD-10-CM

## 2017-12-04 DIAGNOSIS — K5790 Diverticulosis of intestine, part unspecified, without perforation or abscess without bleeding: Secondary | ICD-10-CM | POA: Diagnosis present

## 2017-12-04 DIAGNOSIS — R188 Other ascites: Secondary | ICD-10-CM | POA: Diagnosis present

## 2017-12-04 DIAGNOSIS — J90 Pleural effusion, not elsewhere classified: Secondary | ICD-10-CM | POA: Diagnosis present

## 2017-12-04 DIAGNOSIS — K3189 Other diseases of stomach and duodenum: Secondary | ICD-10-CM | POA: Diagnosis present

## 2017-12-04 LAB — COMPREHENSIVE METABOLIC PANEL
ALT: 50 U/L — ABNORMAL HIGH (ref 0–44)
ANION GAP: 7 (ref 5–15)
AST: 99 U/L — ABNORMAL HIGH (ref 15–41)
Albumin: 2.6 g/dL — ABNORMAL LOW (ref 3.5–5.0)
Alkaline Phosphatase: 95 U/L (ref 38–126)
BILIRUBIN TOTAL: 2.7 mg/dL — AB (ref 0.3–1.2)
BUN: 29 mg/dL — AB (ref 8–23)
CHLORIDE: 108 mmol/L (ref 98–111)
CO2: 22 mmol/L (ref 22–32)
Calcium: 9.8 mg/dL (ref 8.9–10.3)
Creatinine, Ser: 1.37 mg/dL — ABNORMAL HIGH (ref 0.44–1.00)
GFR calc Af Amer: 43 mL/min — ABNORMAL LOW (ref >60)
GFR, EST NON AFRICAN AMERICAN: 37 mL/min — AB (ref >60)
Glucose, Bld: 101 mg/dL — ABNORMAL HIGH (ref 70–99)
POTASSIUM: 4.9 mmol/L (ref 3.5–5.1)
Sodium: 137 mmol/L (ref 135–145)
TOTAL PROTEIN: 6.3 g/dL — AB (ref 6.5–8.1)

## 2017-12-04 LAB — URINALYSIS, ROUTINE W REFLEX MICROSCOPIC
Bilirubin Urine: NEGATIVE
GLUCOSE, UA: NEGATIVE
KETONES UR: NEGATIVE
Nitrite: NEGATIVE
Specific Gravity, Urine: 1.024 (ref 1.001–1.03)
WBC, UA: >=60 /HPF — AB (ref 0–5)
pH: 6 (ref 5.0–8.0)

## 2017-12-04 LAB — CBC WITH DIFFERENTIAL/PLATELET
BASOS ABS: 0.1 10*3/uL (ref 0.0–0.1)
Basophils Absolute: 96 cells/uL (ref 0–200)
Basophils Relative: 0.9 %
Basophils Relative: 1 %
EOS PCT: 1.5 %
EOS PCT: 2 %
Eosinophils Absolute: 161 cells/uL (ref 15–500)
Eosinophils Absolute: 0.2 10*3/uL (ref 0.0–0.7)
HCT: 42.1 % (ref 35.0–45.0)
HEMATOCRIT: 48.1 % — AB (ref 36.0–46.0)
Hemoglobin: 14.8 g/dL (ref 11.7–15.5)
Hemoglobin: 15.6 g/dL — ABNORMAL HIGH (ref 12.0–15.0)
LYMPHS PCT: 9 %
Lymphs Abs: 1423 cells/uL (ref 850–3900)
Lymphs Abs: 0.9 10*3/uL (ref 0.7–4.0)
MCH: 30.5 pg (ref 27.0–33.0)
MCH: 30.3 pg (ref 26.0–34.0)
MCHC: 35.2 g/dL (ref 32.0–36.0)
MCHC: 32.4 g/dL (ref 30.0–36.0)
MCV: 86.8 fL (ref 80.0–100.0)
MCV: 93.4 fL (ref 78.0–100.0)
MPV: 10.9 fL (ref 7.5–12.5)
Monocytes Absolute: 1.3 10*3/uL — ABNORMAL HIGH (ref 0.1–1.0)
Monocytes Relative: 15.5 %
Monocytes Relative: 13 %
NEUTROS PCT: 75 %
Neutro Abs: 7362 cells/uL (ref 1500–7800)
Neutro Abs: 7.4 10*3/uL (ref 1.7–7.7)
Neutrophils Relative %: 68.8 %
PLATELETS: 383 10*3/uL (ref 140–400)
Platelets: 388 10*3/uL (ref 150–400)
RBC: 4.85 10*6/uL (ref 3.80–5.10)
RBC: 5.15 MIL/uL — ABNORMAL HIGH (ref 3.87–5.11)
RDW: 18 % — AB (ref 11.0–15.0)
RDW: 23.1 % — ABNORMAL HIGH (ref 11.5–15.5)
SMEAR REVIEW: ADEQUATE
TOTAL LYMPHOCYTE: 13.3 %
WBC mixed population: 1659 cells/uL — ABNORMAL HIGH (ref 200–950)
WBC: 10.7 10*3/uL (ref 3.8–10.8)
WBC: 9.9 10*3/uL (ref 4.0–10.5)

## 2017-12-04 LAB — I-STAT TROPONIN, ED: Troponin i, poc: 0.01 ng/mL (ref 0.00–0.08)

## 2017-12-04 LAB — COMPLETE METABOLIC PANEL WITH GFR
AG RATIO: 0.8 (calc) — AB (ref 1.0–2.5)
ALT: 43 U/L — AB (ref 6–29)
AST: 78 U/L — AB (ref 10–35)
Albumin: 2.6 g/dL — ABNORMAL LOW (ref 3.6–5.1)
Alkaline phosphatase (APISO): 91 U/L (ref 33–130)
BUN/Creatinine Ratio: 26 (calc) — ABNORMAL HIGH (ref 6–22)
BUN: 29 mg/dL — ABNORMAL HIGH (ref 7–25)
CALCIUM: 9.5 mg/dL (ref 8.6–10.4)
CO2: 25 mmol/L (ref 20–32)
Chloride: 107 mmol/L (ref 98–110)
Creat: 1.1 mg/dL — ABNORMAL HIGH (ref 0.60–0.93)
GFR, EST NON AFRICAN AMERICAN: 49 mL/min/{1.73_m2} — AB (ref >=60)
GFR, Est African American: 57 mL/min/{1.73_m2} — ABNORMAL LOW (ref >=60)
Globulin: 3.3 g/dL (calc) (ref 1.9–3.7)
Glucose, Bld: 89 mg/dL (ref 65–99)
POTASSIUM: 4.9 mmol/L (ref 3.5–5.3)
Sodium: 138 mmol/L (ref 135–146)
Total Bilirubin: 2.5 mg/dL — ABNORMAL HIGH (ref 0.2–1.2)
Total Protein: 5.9 g/dL — ABNORMAL LOW (ref 6.1–8.1)

## 2017-12-04 LAB — BRAIN NATRIURETIC PEPTIDE: B Natriuretic Peptide: 289.5 pg/mL — ABNORMAL HIGH (ref 0.0–100.0)

## 2017-12-04 LAB — I-STAT CHEM 8, ED
BUN: 36 mg/dL — ABNORMAL HIGH (ref 8–23)
Calcium, Ion: 1.23 mmol/L (ref 1.15–1.40)
Chloride: 107 mmol/L (ref 98–111)
Creatinine, Ser: 1.1 mg/dL — ABNORMAL HIGH (ref 0.44–1.00)
Glucose, Bld: 96 mg/dL (ref 70–99)
HEMATOCRIT: 52 % — AB (ref 36.0–46.0)
Hemoglobin: 17.7 g/dL — ABNORMAL HIGH (ref 12.0–15.0)
POTASSIUM: 4.6 mmol/L (ref 3.5–5.1)
SODIUM: 138 mmol/L (ref 135–145)
TCO2: 23 mmol/L (ref 22–32)

## 2017-12-04 LAB — AMMONIA: AMMONIA: 41 umol/L — AB (ref 9–35)

## 2017-12-04 MED ORDER — IOPAMIDOL (ISOVUE-370) INJECTION 76%
100.0000 mL | Freq: Once | INTRAVENOUS | Status: AC | PRN
  Administered 2017-12-04: 100 mL via INTRAVENOUS

## 2017-12-04 MED ORDER — FAMOTIDINE 20 MG PO TABS
20.0000 mg | ORAL_TABLET | Freq: Every day | ORAL | Status: DC
  Administered 2017-12-04 – 2017-12-05 (×2): 20 mg via ORAL
  Filled 2017-12-04 (×2): qty 1

## 2017-12-04 MED ORDER — LEVOTHYROXINE SODIUM 112 MCG PO TABS
112.0000 ug | ORAL_TABLET | Freq: Every morning | ORAL | Status: DC
  Administered 2017-12-05: 112 ug via ORAL
  Filled 2017-12-04: qty 1

## 2017-12-04 MED ORDER — HEPARIN BOLUS VIA INFUSION
4000.0000 [IU] | Freq: Once | INTRAVENOUS | Status: AC
  Administered 2017-12-04: 4000 [IU] via INTRAVENOUS
  Filled 2017-12-04: qty 4000

## 2017-12-04 MED ORDER — ONDANSETRON HCL 4 MG/2ML IJ SOLN: 4.0000 mg | Freq: Four times a day (QID) | INTRAMUSCULAR | Status: DC | PRN

## 2017-12-04 MED ORDER — HYDRALAZINE HCL 20 MG/ML IJ SOLN: 5.0000 mg | INTRAMUSCULAR | Status: DC | PRN

## 2017-12-04 MED ORDER — ONDANSETRON HCL 4 MG PO TABS: 4.0000 mg | ORAL_TABLET | Freq: Four times a day (QID) | ORAL | Status: DC | PRN

## 2017-12-04 MED ORDER — VITAMIN D 1000 UNITS PO TABS
1000.0000 [IU] | ORAL_TABLET | Freq: Two times a day (BID) | ORAL | Status: DC
  Administered 2017-12-04 – 2017-12-06 (×4): 1000 [IU] via ORAL
  Filled 2017-12-04 (×5): qty 1

## 2017-12-04 MED ORDER — OXYCODONE HCL 5 MG PO TABS: 5.0000 mg | ORAL_TABLET | Freq: Four times a day (QID) | ORAL | Status: DC | PRN

## 2017-12-04 MED ORDER — FUROSEMIDE 20 MG PO TABS
20.0000 mg | ORAL_TABLET | Freq: Two times a day (BID) | ORAL | Status: DC
  Administered 2017-12-04 – 2017-12-06 (×4): 20 mg via ORAL
  Filled 2017-12-04 (×4): qty 1

## 2017-12-04 MED ORDER — HEPARIN (PORCINE) IN NACL 100-0.45 UNIT/ML-% IJ SOLN
1050.0000 [IU]/h | INTRAMUSCULAR | Status: DC
  Administered 2017-12-04: 1300 [IU]/h via INTRAVENOUS
  Administered 2017-12-05: 1050 [IU]/h via INTRAVENOUS
  Filled 2017-12-04 (×2): qty 250

## 2017-12-04 MED ORDER — IOPAMIDOL (ISOVUE-370) INJECTION 76%
INTRAVENOUS | Status: AC
  Filled 2017-12-04: qty 100

## 2017-12-04 MED ORDER — METOPROLOL SUCCINATE ER 100 MG PO TB24
200.0000 mg | ORAL_TABLET | Freq: Every day | ORAL | Status: DC
  Administered 2017-12-04: 200 mg via ORAL
  Filled 2017-12-04 (×2): qty 2

## 2017-12-04 MED ORDER — DM-GUAIFENESIN ER 30-600 MG PO TB12: 1.0000 | ORAL_TABLET | Freq: Two times a day (BID) | ORAL | Status: DC | PRN

## 2017-12-04 MED ORDER — ZOLPIDEM TARTRATE 5 MG PO TABS: 5.0000 mg | ORAL_TABLET | Freq: Every evening | ORAL | Status: DC | PRN

## 2017-12-04 MED ORDER — POLYETHYLENE GLYCOL 3350 17 G PO PACK: 17.0000 g | PACK | Freq: Every day | ORAL | Status: DC | PRN

## 2017-12-04 MED ORDER — LEVALBUTEROL HCL 1.25 MG/0.5ML IN NEBU
1.2500 mg | INHALATION_SOLUTION | Freq: Four times a day (QID) | RESPIRATORY_TRACT | Status: DC
  Administered 2017-12-05: 1.25 mg via RESPIRATORY_TRACT
  Filled 2017-12-04 (×2): qty 0.5

## 2017-12-04 MED ORDER — LACTULOSE 10 GM/15ML PO SOLN
10.0000 g | Freq: Every day | ORAL | Status: DC
  Administered 2017-12-05 – 2017-12-06 (×2): 10 g via ORAL
  Filled 2017-12-04 (×2): qty 15

## 2017-12-04 NOTE — ED Provider Notes (Addendum)
MSE was initiated and I personally evaluated the patient and placed orders (if any) at  5:35 PM on December 04, 2017.  The patient appears stable so that the remainder of the MSE may be completed by another provider.  Patient placed in Quick Look pathway, seen and evaluated   Chief Complaint: DVT, shortness of breath  HPI:   Pt diagnosed with extensive DVT of LLE today OP. Pt reporting that she has had SOB with exertion over the past 3 weeks. No CP. One syncopal a couple weeks ago but none recently. Not on AC.   ROS: See HPI.  Physical Exam:   Gen: No distress  Neuro: Awake and Alert  Skin: Warm    Focused Exam: Tachycardic. No MGR. Tachypneic but able to speak in full sentences. Lungs diminished in b/l base but no wheezes/rales. 3+ pitting edema to LLE. Cap refill <2s in LLE.   Initiation of care has begun. The patient has been counseled on the process, plan, and necessity for staying for the completion/evaluation, and the remainder of the medical screening examination     Brittney Valentine 12/04/17 2126    Jola Schmidt, MD 12/05/17 [phone]

## 2017-12-04 NOTE — ED Triage Notes (Addendum)
Pt sent here d/t positive DVT study of L leg today.  States L leg is double size of R leg.  C/o some pain to L leg, but c/o sob with ambulation.  Had to use walker d/t sob today.

## 2017-12-04 NOTE — Progress Notes (Signed)
ANTICOAGULATION CONSULT NOTE - Initial Consult  Pharmacy Consult for heparin Indication: DVT  Allergies  Allergen Reactions  . Amoxicillin Palpitations  . Ciprofloxacin Swelling  . Sulfonamide Derivatives Other (See Comments)    See little dots, skin feels like its burning  . Robaxin [Methocarbamol] Palpitations  . Verapamil Palpitations    Patient Measurements: Height: 5\' 5"  (165.1 cm) Weight: 213 lb (96.6 kg) IBW/kg (Calculated) : 57 Heparin Dosing Weight: 78.9 kg  Vital Signs: Temp: 97.8 F (36.6 C) (06/28 1739) Temp Source: Oral (06/28 1739) BP: 127/84 (06/28 1815) Pulse Rate: 120 (06/28 1815)  Labs: Recent Labs    12/03/17 1720 12/04/17 1743 12/04/17 1801  HGB 14.8  --  17.7*  HCT 42.1  --  52.0*  PLT 383  --   --   CREATININE 1.10* 1.37* 1.10*    Estimated Creatinine Clearance: 51.6 mL/min (A) (by C-G formula based on SCr of 1.1 mg/dL (H)).   Medical History: Past Medical History:  Diagnosis Date  . Acute UTI   . Anemia   . Blood transfusion without reported diagnosis   . Cirrhosis ([place])   . Degenerative disk disease    knees  . Diverticulosis   . Dysrhythmia   . GERD (gastroesophageal reflux disease)   . Hemorrhoids   . History of kidney stones   . Hypertension   . Hypothyroidism   . Obesity   . Paroxysmal atrial fibrillation ([facility])   . Partial small bowel obstruction ([place])   . Personal history of colonic polyps 03/25/2012   8 mm rectal adenoma 03/2012  . Portal hypertensive gastropathy ([place])   . Shingles   . Thyroid disease   . Varices, esophageal ([place])   . Zoster     Assessment: 65 yof diagnosed with extensive DVT of LLE today as an outpatient. No anticoagulation prior to admission. CTA ordered but not yet completed.   Hgb 17.7, plt 383. Scr 1.1. No documented signs/symptoms of bleeding.   Goal of Therapy:  Heparin level 0.3-0.7 units/ml Monitor platelets by anticoagulation protocol: Yes   Plan:  Give 4000 units bolus x 1 Start  heparin infusion at 1300 units/hr Check anti-Xa level in 8 hours and daily while on heparin Continue to monitor H&H and platelets  Doylene Canard, PharmD Clinical Pharmacist  Pager: [phone] Phone: [phone] 12/04/2017,6:39 PM

## 2017-12-04 NOTE — Progress Notes (Addendum)
Primary Care Physician: Unk Pinto, MD Referring Physician: Dr. Rayann Heman Cardiologist: Dr. Rene Paci Brittney Valentine is a 77 y.o. female with a h/o afib, for many years controlled on flecainide. But in the setting of acute illness, 04/14/17, it returned. She also had VT in the hospital with acute reduction in EF. She was placed on amiodarone. This  was stopped at the time of Dr. Jackalyn Lombard visit, 07/06/17,  for significant thyroid dysfunction. His plan was to start anticoagulation and in 2-4 weeks, consider hospitalization for sotalol. She has h/o cirrhosis and esophageal varices and was not on anticoagulation. She  was placed on eliquis 5 mg bid after Dr. Rayann Heman discussed with Dr. Percival Spanish. She did have some hematuria but has resolved and she did not have to stop anticoagulation.No other signs of bleeding.  In the clinic 2/26, we discussed Dr.Allred's plan to start sotalol to achieve NSR, after on anticoagulation x 3 weeks and necessary hospitalization. Pt is very clear that she does not want hospitalization at this time. She is just now starting to feel a little better and does not want to come into the hospital or take another med.  F/u in afib clinic, 3/20, one month from above visit to discuss sotalol admission. She has been taking anticoagulation as ordered.  Pt reports over the last month, she has had multiple clear stools a day and has lost 13 lbs.  NO blood noted in diarrhea.She reports taking Keflex in February for UTI. She thought it might be from eliquis but she has not called here or to PCP to report diarrhea. She has lost 13 lbs. She is still fairly asymptomatic with rate controlled afib, but still does not want to entertain the thought of starting sotalol now, best to wait until the diarrhea can be resolved.   F/u afib clinic, 5/16. She is off anticoagulation for abnormal test findings. She is pending a kidney stone removal  Next week and a endo/ colonoscopy after that. She is anxious  to get back on blood thinner when possible as she is now interested in hospitalization for sotalol to resume NSR. The longer she stays in afib the more she feels it is draining her energy.  F/u afib clinic, 6/28. Initially when pt had to come off anticoagulation for further testing/hematuria, the plan was to see pt back resume anticoagulation and see about getting her into the hospital for sotalol load. The pt presents today reporting that she had kidney stone removal/ureteral stent 6/6. She states that she had a syncopal episode after the surgery at home and reported to surgeon. No further syncopal episodes but she has been extremely weak and has noted increased swelling of left leg. She was back in the ER 6/19 for generalized weakness, given fluid and was told that she had an UTI and has now finished antibiotics. She has noted increased swelling of left leg since then, but it was swollen then and reported this but no w/u for leg. She continues in afib, is still pending endoscopy/colonoscoy for esophogeal varices and cirrhosis. EKG shows LBBB.  Today, she denies symptoms of palpitations, chest pain, shortness of breath, orthopnea, PND, lower extremity edema, dizziness, presyncope, syncope, or neurologic sequela. The patient is tolerating medications without difficulties and is otherwise without complaint today.   Past Medical History:  Diagnosis Date  . Acute UTI   . Anemia   . Blood transfusion without reported diagnosis   . Cirrhosis ([facility])   . Degenerative disk disease  knees  . Diverticulosis   . Dysrhythmia   . GERD (gastroesophageal reflux disease)   . Hemorrhoids   . History of kidney stones   . Hypertension   . Hypothyroidism   . Obesity   . Paroxysmal atrial fibrillation ([facility])   . Partial small bowel obstruction ([place])   . Personal history of colonic polyps 03/25/2012   8 mm rectal adenoma 03/2012  . Portal hypertensive gastropathy ([place])   . Shingles   . Thyroid disease   .  Varices, esophageal ([place])   . Zoster    Past Surgical History:  Procedure Laterality Date  . ABDOMINAL HYSTERECTOMY    . APPENDECTOMY  1991  . CHOLECYSTECTOMY  2000  . COLON SURGERY    . COLON SURGERY    . COLONOSCOPY    . CYSTOSCOPY     10-26-17 budyzn  . CYSTOSCOPY/URETEROSCOPY/HOLMIUM LASER/STENT PLACEMENT Left 10/26/2017   Procedure: CYSTOSCOPY, URETEROSCOPY/RETROGRADE/STENT PLACEMENT;  Surgeon: Nickie Retort, MD;  Location: [facility];  Service: Urology;  Laterality: Left;  NEEDS DIGITAL URETEROSCOPE  . CYSTOSCOPY/URETEROSCOPY/HOLMIUM LASER/STENT PLACEMENT Left 11/12/2017   Procedure: CYSTOSCOPY LEFT RETROGRADE PYELOGRAM LEFT URETEROSCOPY HOLMIUM LASER AND LEFT URETERAL STENT EXCHANGE AND LASER ENDO URETEROTOMY;  Surgeon: Ardis Hughs, MD;  Location: [facility];  Service: Urology;  Laterality: Left;  . ESOPHAGOGASTRODUODENOSCOPY  multiple    Current Outpatient Medications  Medication Sig Dispense Refill  . Cholecalciferol (VITAMIN D-3) 1000 UNITS CAPS Take 1,000 Units by mouth 2 (two) times daily.     . furosemide (LASIX) 20 MG tablet Take 20 mg by mouth 2 (two) times daily.    Marland Kitchen levothyroxine (SYNTHROID, LEVOTHROID) 112 MCG tablet TAKE 1 TABLET BY MOUTH IN THE MORNING (Patient taking differently: Take 168 mcg by mouth daily on Monday, Wednesday and Friday. Take 112 mcg by mouth daily on all other days) 90 tablet 1  . metoprolol succinate (TOPROL-XL) 100 MG 24 hr tablet Take 2 tablets  daily (Patient taking differently: Take 200 mg by mouth daily. ) 135 tablet 1  . ranitidine (ZANTAC) 300 MG tablet Take 1 tablet (300 mg total) at bedtime by mouth. (Patient taking differently: Take 300 mg by mouth at bedtime as needed for heartburn. ) 30 tablet 1   No current facility-administered medications for this encounter.     Allergies  Allergen Reactions  . Amoxicillin Palpitations  . Ciprofloxacin Swelling  . Sulfonamide Derivatives Other (See Comments)    See little dots, skin feels  like its burning  . Robaxin [Methocarbamol] Palpitations  . Verapamil Palpitations    Social History   Socioeconomic History  . Marital status: Single    Spouse name: Not on file  . Number of children: Not on file  . Years of education: Not on file  . Highest education level: Not on file  Occupational History  . Not on file  Social Needs  . Financial resource strain: Not on file  . Food insecurity:    Worry: Not on file    Inability: Not on file  . Transportation needs:    Medical: Not on file    Non-medical: Not on file  Tobacco Use  . Smoking status: Never Smoker  . Smokeless tobacco: Never Used  Substance and Sexual Activity  . Alcohol use: No  . Drug use: No  . Sexual activity: Not Currently    Partners: Male  Lifestyle  . Physical activity:    Days per week: Not on file    Minutes per session: Not  on file  . Stress: Not on file  Relationships  . Social connections:    Talks on phone: Not on file    Gets together: Not on file    Attends religious service: Not on file    Active member of club or organization: Not on file    Attends meetings of clubs or organizations: Not on file    Relationship status: Not on file  . Intimate partner violence:    Fear of current or ex partner: Not on file    Emotionally abused: Not on file    Physically abused: Not on file    Forced sexual activity: Not on file  Other Topics Concern  . Not on file  Social History Narrative  . Not on file    Family History  Problem Relation Age of Onset  . Heart failure Mother        died from  . Hypertension Mother   . Heart attack Father        died from  . Hypertension Sister     ROS- All systems are reviewed and negative except as per the HPI above  Physical Exam: Vitals:   12/03/17 1401  BP: 106/72  Pulse: (!) 106  Weight: 213 lb (96.6 kg)  Height: 5\' 5"  (1.651 m)   Wt Readings from Last 3 Encounters:  12/03/17 213 lb (96.6 kg)  12/03/17 213 lb 12.8 oz (97 kg)    11/25/17 206 lb (93.4 kg)    Labs: Lab Results  Component Value Date   NA 138 12/03/2017   K 4.9 12/03/2017   CL 107 12/03/2017   CO2 25 12/03/2017   GLUCOSE 89 12/03/2017   BUN 29 (H) 12/03/2017   CREATININE 1.10 (H) 12/03/2017   CALCIUM 9.5 12/03/2017   PHOS 5.5 (H) 04/12/2017   MG 2.0 10/08/2017   Lab Results  Component Value Date   INR 1.64 04/12/2017   Lab Results  Component Value Date   CHOL 135 09/02/2017   HDL 46 (L) 09/02/2017   LDLCALC 74 09/02/2017   TRIG 74 09/02/2017     GEN- The patient is chronically ill appearing, alert and oriented x 3 today, color sallow.   Head- normocephalic, atraumatic Eyes-  Sclera clear, conjunctiva pink Ears- hearing intact Oropharynx- clear Neck- supple, no JVP Lymph- no cervical lymphadenopathy Lungs- Clear to ausculation bilaterally, normal work of breathing Heart- irregular rate and rhythm, no murmurs, rubs or gallops, PMI not laterally displaced GI- soft, NT, ND, + BS Extremities- no clubbing, cyanosis,  trace edema rt leg, 2+ edema of Left LE up to knee area MS- no significant deformity or atrophy Skin- no rash or lesion Psych- euthymic mood, full affect Neuro- strength and sensation are intact  EKG- afib at 106 bpm, Qrs int 140 ms, Qtc 435 ms Epic records reviewed Echo- 04/2017 -Study Conclusions  - Left ventricle: The cavity size was normal. Wall thickness was   increased in a pattern of mild LVH. Systolic function was   moderately reduced. The estimated ejection fraction was in the   range of 35% to 40%. Diffuse hypokinesis. Indeterminant diastolic   function (atrial fibrillation). - Aortic valve: There was no stenosis. - Mitral valve: Mildly calcified annulus. There was mild   regurgitation. - Left atrium: The atrium was moderately dilated. - Right ventricle: The cavity size was normal. Systolic function   was normal. - Right atrium: The atrium was mildly dilated. - Tricuspid valve: Peak RV-RA gradient  (S): 24  mm Hg. - Systemic veins: IVC was not visualized.  Impressions:  - The patient was in atrial fibrillation. Normal LV size with mild   LV hypertrophy. EF 35-40%, diffuse hypokinesis. Normal RV size   and systolic function. Mild mitral regurgitation.    Assessment and Plan: 1. Persistent afib Off amiodarone and previous flecainde Continues  off anticoagualtion for pending colonoscopy and endoscopy Reasonable rate control, continue metoprolol Pt does want to restart anticoagulation when cleared and will plan for hospitalization for sotalol after 3 weeks of uninterrupted therapy, just unsure as to when she can restart DOAC Will update echo  2.Chadsvasc score of 4 Pt was on eliquis 5 mg bid, now on hold H/o cirrhosis, esophageal varices  Previous hematuria resolved with stone removal  3. LEE Left leg much more swollen than Rt Concern for DVT with afib, off anticoagulation,recent surgery  Left lower extremity, u/s ordered  4. Recent UYI Pt has finished Keflex Culture reviewed and drug was sensitve  I will see her back next week    Butch Penny C. Jamala Kohen, [facility] [address] [place], [place] [address] [phone]

## 2017-12-04 NOTE — Progress Notes (Signed)
Left lower extremity venous duplex has been completed. There is evidence of acute deep vein thrombosis involving the femoral, popliteal, intramuscular gastrocnemius, and peroneal veins of the left lower extremity.  There is also evidence of acute superficial vein thrombosis involving the short saphenous vein of the left lower extremity. Results were given to [facility] at Dr. Liliana Cline office.  12/04/17 4:21 PM Brittney Valentine RVT

## 2017-12-04 NOTE — H&P (Signed)
History and Physical    Brittney Valentine TWK:[id number] DOB: 1940/11/01 DOA: 12/04/2017  Referring MD/NP/PA:   PCP: Unk Pinto, MD   Patient coming from:  The patient is coming from home.  At baseline, pt is independent for most of ADL.   Chief Complaint: SOB and left leg swelling  HPI: Brittney Valentine is a 77 y.o. female with medical history significant of liver cirrhosis, ascites, esophageal varicose, portal hypertension, diverticulosis, obesity, PAF not on anticoagulants, CHF with EF of 35%, kidney stone (s/p of CYSTOSCOPY/URETEROSCOPY/HOLMIUM LASER/STENT PLACEMENT), who presents with shortness of breath, left leg swelling.  Patient states that she underwent ureteral stent placement precedure due to kidney stone on 11/12/2017.  After that, she developed mild shortness of breath.  She has mild dry cough, but no chest pain. Few days ago she noted left leg swelling, which has been progressively getting worse.  Patient has nausea, but no vomiting, diarrhea or abdominal pain.  No symptoms of UTI or unilateral weakness.  Patient was seen by PCP and had doppler done in office, which showed a large left lower extremity DVT. She was to ED for further evaluation and treatment. She previously was on Eliquis for A fib for about 1 month, but she was taken off of this med for ureteral stenting procedure, has not restarted yet.  ED Course: pt was found to have WBC 9.9, negative troponin, renal function close to baseline, temperature normal, tachycardia with heart rate up to 120s, no tachypnea, oxygen saturation 100% on room air. CT angiogram of chest showed bilateral PE without evidence of right heart strain, and also bilateral pleural effusions, right considerably greater than left. Patient is placed on stepdown for observation.  Review of Systems:   General: no fevers, chills, no body weight gain, has fatigue HEENT: no blurry vision, hearing changes or sore throat Respiratory: has dyspnea, coughing,  no wheezing CV: no chest pain, no palpitations GI: no nausea, vomiting, abdominal pain, diarrhea, constipation GU: no dysuria, burning on urination, increased urinary frequency, hematuria  Ext: has leg edema Neuro: no unilateral weakness, numbness, or tingling, no vision change or hearing loss Skin: no rash, no skin tear. MSK: No muscle spasm, no deformity, no limitation of range of movement in spin Heme: No easy bruising.  Travel history: No recent long distant travel.  Allergy:  Allergies  Allergen Reactions  . Amoxicillin Palpitations  . Ciprofloxacin Swelling  . Sulfonamide Derivatives Other (See Comments)    See little dots, skin feels like its burning  . Robaxin [Methocarbamol] Palpitations  . Verapamil Palpitations    Past Medical History:  Diagnosis Date  . Acute UTI   . Anemia   . Blood transfusion without reported diagnosis   . Cirrhosis ([place])   . Degenerative disk disease    knees  . Diverticulosis   . Dysrhythmia   . GERD (gastroesophageal reflux disease)   . Hemorrhoids   . History of kidney stones   . Hypertension   . Hypothyroidism   . Obesity   . Paroxysmal atrial fibrillation (HCC)   . Partial small bowel obstruction ([place])   . Personal history of colonic polyps 03/25/2012   8 mm rectal adenoma 03/2012  . Portal hypertensive gastropathy ([place])   . Shingles   . Thyroid disease   . Varices, esophageal ([place])   . Zoster     Past Surgical History:  Procedure Laterality Date  . ABDOMINAL HYSTERECTOMY    . APPENDECTOMY  1991  . CHOLECYSTECTOMY  2000  .  COLON SURGERY    . COLON SURGERY    . COLONOSCOPY    . CYSTOSCOPY     10-26-17 budyzn  . CYSTOSCOPY/URETEROSCOPY/HOLMIUM LASER/STENT PLACEMENT Left 10/26/2017   Procedure: CYSTOSCOPY, URETEROSCOPY/RETROGRADE/STENT PLACEMENT;  Surgeon: Nickie Retort, MD;  Location: [facility];  Service: Urology;  Laterality: Left;  NEEDS DIGITAL URETEROSCOPE  . CYSTOSCOPY/URETEROSCOPY/HOLMIUM LASER/STENT PLACEMENT  Left 11/12/2017   Procedure: CYSTOSCOPY LEFT RETROGRADE PYELOGRAM LEFT URETEROSCOPY HOLMIUM LASER AND LEFT URETERAL STENT EXCHANGE AND LASER ENDO URETEROTOMY;  Surgeon: Ardis Hughs, MD;  Location: [facility];  Service: Urology;  Laterality: Left;  . ESOPHAGOGASTRODUODENOSCOPY  multiple    Social History:  reports that she has never smoked. She has never used smokeless tobacco. She reports that she does not drink alcohol or use drugs.  Family History:  Family History  Problem Relation Age of Onset  . Heart failure Mother        died from  . Hypertension Mother   . Heart attack Father        died from  . Hypertension Sister      Prior to Admission medications   Medication Sig Start Date End Date Taking? Authorizing Provider  Cholecalciferol (VITAMIN D-3) 1000 UNITS CAPS Take 1,000 Units by mouth 2 (two) times daily.    Yes [provider]  furosemide (LASIX) 20 MG tablet Take 20 mg by mouth 2 (two) times daily.   Yes [provider]  levothyroxine (SYNTHROID, LEVOTHROID) 112 MCG tablet TAKE 1 TABLET BY MOUTH IN THE MORNING 09/05/17  Yes Unk Pinto, MD  metoprolol succinate (TOPROL-XL) 100 MG 24 hr tablet Take 2 tablets  daily Patient taking differently: Take 200 mg by mouth daily.  10/29/17  Yes Unk Pinto, MD  ranitidine (ZANTAC) 300 MG tablet Take 1 tablet (300 mg total) at bedtime by mouth. Patient taking differently: Take 300 mg by mouth at bedtime as needed for heartburn.  04/22/17 04/22/18 Yes Vicie Mutters, PA-C    Physical Exam: Vitals:   12/04/17 1815 12/04/17 2245 12/04/17 2300 12/04/17 2315  BP: 127/84 (!) 117/58 105/73 (!) 117/93  Pulse: (!) 120   (!) 111  Resp:   19   Temp:      TempSrc:      SpO2: 100% 100%    Weight:      Height:       General: Not in acute distress HEENT:       Eyes: PERRL, EOMI, no scleral icterus.       ENT: No discharge from the ears and nose, no pharynx injection, no tonsillar enlargement.        Neck: No  JVD, no bruit, no mass felt. Heme: No neck lymph node enlargement. Cardiac: S1/S2, RRR, No murmurs, No gallops or rubs. Respiratory: No rales, wheezing, rhonchi or rubs. GI: Soft, nondistended, nontender, no rebound pain, no organomegaly, BS present. GU: No hematuria Ext: 2+DP/PT pulse bilaterally. Has mild right leg swelling and severe left leg swelling, with almost double signs of right leg. Musculoskeletal: No joint deformities, No joint redness or warmth, no limitation of ROM in spin. Skin: No rashes.  Neuro: Alert, oriented X3, cranial nerves II-XII grossly intact, moves all extremities normally.  Psych: Patient is not psychotic, no suicidal or hemocidal ideation.  Labs on Admission: I have personally reviewed following labs and imaging studies  CBC: Recent Labs  Lab 12/03/17 1720 12/04/17 1743 12/04/17 1801  WBC 10.7 9.9  --   NEUTROABS 7,362 7.4  --  HGB 14.8 15.6* 17.7*  HCT 42.1 48.1* 52.0*  MCV 86.8 93.4  --   PLT 383 388  --    Basic Metabolic Panel: Recent Labs  Lab 12/03/17 1720 12/04/17 1743 12/04/17 1801  NA 138 137 138  K 4.9 4.9 4.6  CL 107 108 107  CO2 25 22  --   GLUCOSE 89 101* 96  BUN 29* 29* 36*  CREATININE 1.10* 1.37* 1.10*  CALCIUM 9.5 9.8  --    GFR: Estimated Creatinine Clearance: 51.6 mL/min (A) (by C-G formula based on SCr of 1.1 mg/dL (H)). Liver Function Tests: Recent Labs  Lab 12/03/17 1720 12/04/17 1743  AST 78* 99*  ALT 43* 50*  ALKPHOS  --  95  BILITOT 2.5* 2.7*  PROT 5.9* 6.3*  ALBUMIN  --  2.6*   No results for input(s): LIPASE, AMYLASE in the last 168 hours. Recent Labs  Lab 12/04/17 2142  AMMONIA 41*   Coagulation Profile: No results for input(s): INR, PROTIME in the last 168 hours. Cardiac Enzymes: No results for input(s): CKTOTAL, CKMB, CKMBINDEX, TROPONINI in the last 168 hours. BNP (last 3 results) No results for input(s): PROBNP in the last 8760 hours. HbA1C: No results for input(s): HGBA1C in the last 72  hours. CBG: No results for input(s): GLUCAP in the last 168 hours. Lipid Profile: No results for input(s): CHOL, HDL, LDLCALC, TRIG, CHOLHDL, LDLDIRECT in the last 72 hours. Thyroid Function Tests: No results for input(s): TSH, T4TOTAL, FREET4, T3FREE, THYROIDAB in the last 72 hours. Anemia Panel: No results for input(s): VITAMINB12, FOLATE, FERRITIN, TIBC, IRON, RETICCTPCT in the last 72 hours. Urine analysis:    Component Value Date/Time   COLORURINE DARK YELLOW 12/03/2017 1720   APPEARANCEUR CLOUDY (A) 12/03/2017 1720   LABSPEC 1.024 12/03/2017 1720   PHURINE 6.0 12/03/2017 1720   GLUCOSEU NEGATIVE 12/03/2017 1720   HGBUR 3+ (A) 12/03/2017 1720   BILIRUBINUR NEGATIVE 11/25/2017 1918   KETONESUR NEGATIVE 12/03/2017 1720   PROTEINUR 1+ (A) 12/03/2017 1720   UROBILINOGEN 1 12/21/2014 1004   NITRITE NEGATIVE 12/03/2017 1720   LEUKOCYTESUR 2+ (A) 12/03/2017 1720   Sepsis Labs: @LABRCNTIP (procalcitonin:4,lacticidven:4) ) Recent Results (from the past 240 hour(s))  Urine culture     Status: Abnormal   Collection Time: 11/25/17 11:00 PM  Result Value Ref Range Status   Specimen Description URINE, CLEAN CATCH  Final   Special Requests   Final    NONE Performed at [facility], [place] [address], [place], [place] [address]    Culture >=100,000 COLONIES/mL KLEBSIELLA PNEUMONIAE (A)  Final   Report Status 11/28/2017 FINAL  Final   Organism ID, Bacteria KLEBSIELLA PNEUMONIAE (A)  Final      Susceptibility   Klebsiella pneumoniae - MIC*    AMPICILLIN >=32 RESISTANT Resistant     CEFAZOLIN <=4 SENSITIVE Sensitive     CEFTRIAXONE <=1 SENSITIVE Sensitive     CIPROFLOXACIN 1 SENSITIVE Sensitive     GENTAMICIN <=1 SENSITIVE Sensitive     IMIPENEM <=0.25 SENSITIVE Sensitive     NITROFURANTOIN 64 INTERMEDIATE Intermediate     TRIMETH/SULFA >=320 RESISTANT Resistant     AMPICILLIN/SULBACTAM 4 SENSITIVE Sensitive     PIP/TAZO <=4 SENSITIVE Sensitive     Extended ESBL NEGATIVE  Sensitive     * >=100,000 COLONIES/mL KLEBSIELLA PNEUMONIAE     Radiological Exams on Admission: Ct Angio Chest Pe W/cm &/or Wo Cm  Result Date: 12/04/2017 CLINICAL DATA:  Shortness of breath on exertion, history  of deep venous thrombosis. EXAM: CT ANGIOGRAPHY CHEST WITH CONTRAST TECHNIQUE: Multidetector CT imaging of the chest was performed using the standard protocol during bolus administration of intravenous contrast. Multiplanar CT image reconstructions and MIPs were obtained to evaluate the vascular anatomy. CONTRAST:  131mL ISOVUE-370 IOPAMIDOL (ISOVUE-370) INJECTION 76% COMPARISON:  09/09/2017 FINDINGS: Cardiovascular: Thoracic aorta demonstrates atherosclerotic calcifications without aneurysmal dilatation. Opacification is limited precluding evaluation for any possible dissection. Mild cardiac enlargement is seen. No right heart strain is noted. The pulmonary artery shows a normal branching pattern with filling defects in the left lower and upper lobes as well as significant filling defects within the right lower lobe consistent with pulmonary emboli. Mediastinum/Nodes: The esophagus is within normal limits. No significant hilar or mediastinal adenopathy is noted. Thoracic inlet is within normal limits. Lungs/Pleura: Left lung is well aerated. Minimal left pleural effusion is noted. Mild left basilar atelectasis is seen. The right lung is also well aerated. A large right-sided pleural effusion is noted which is increased in the interval from the prior CT of the abdomen and pelvis. Right lower lobe consolidation is noted related to the underlying pulmonary embolus. This has increased in the interval from the prior exam. Upper Abdomen: Visualized upper abdomen demonstrates cirrhotic change of the liver is well as ascites. There is at least 1 lesion identified within the left lobe of the liver stable in appearance from the prior exam. This has been previously evaluated utilizing MR the abdomen. No other  significant upper abdominal abnormality is seen. Musculoskeletal: Degenerative changes of the thoracic spine are noted. Review of the MIP images confirms the above findings. IMPRESSION: Changes consistent with bilateral pulmonary emboli right greater than left with associated changes in the lower lobes bilaterally as well as bilateral pleural effusions right considerably greater than left. No right heart strain is noted. Changes in the upper abdomen consistent with cirrhosis and ascites. Stable benign hepatic lesion is noted. Aortic Atherosclerosis (ICD10-I70.0). Critical Value/emergent results were called by telephone at the time of interpretation on 12/04/2017 at 8:19 pm to [facility] , who verbally acknowledged these results. Electronically Signed   By: Inez Catalina M.D.   On: 12/04/2017 20:21     EKG: Independently reviewed.  Atrial fibrillation, QTc 471, poor R wave progression, nonspecific T wave change.  Assessment/Plan Principal Problem:   PE (pulmonary thromboembolism) (HCC) Active Problems:   Hepatic cirrhosis (HCC)   Atrial fibrillation with RVR (HCC)   Essential hypertension   Hypothyroidism   Chronic combined systolic and diastolic CHF (congestive heart failure) (HCC)   Ascites   GERD (gastroesophageal reflux disease)   DVT (deep venous thrombosis) (HCC)   Pleural effusion   PE (pulmonary thromboembolism) and left leg DVT: No CT evidence of right heart straining.  Currently hemodynamically stable.  Oxygen saturation normal. -will place on SDU for obs -heparin drip initiated -2D echocardiogram ordered - check BNP - prn xopenex for SOB and oxycodone for pain  Hepatic cirrhosis with ascites: ammonia level slightly elevated at 41 on admission.  Mental status normal.  No abdominal pain.  No signs of hepatic encephalopathy. -Start lactulose 10 g daily  Atrial fibrillation with RVR ([place]): CHA2DS2-VASc Score is 3, needs oral anticoagulation. Patient was on Eluiquis before  ureteral stent placement procedure. Heart rate is 120s. -continue metoprolo -pt will be d/'ced on oral AC for PE.  HTN:  -Continue home medications: Metoprolol -IV hydralazine prn  Hypothyroidism: Last TSH was 13.78 on 08/27/17 -Continue home Synthroid -Check TSH  Chronic  combined systolic and diastolic CHF (congestive heart failure) ([place]): 2D echo on 04/14/2017 showed EF of 35-40%.  Patient has a mild leg edema on the right leg.  Left leg edema is due to DVT.  No respiratory distress.  CHF seems to be compensated. -Continue home Lasix -Check BNP  GERD: -Pepcid   Pleural effusion: Patient has bilateral pleural effusion.  Etiology is not clear.  May be due to combination of CHF and PE.  A potential differential diagnosis is malignancy given an unprovoked PE. -May consider to do diagnostic thoracentesis.   DVT ppx: On IV Heparin Code Status: Partial code (I discussed with the patient in the presence of family, and explained the meaning of CODE STATUS, patient wants to be partial code, OK for inbubation and no chest compression).     Family Communication: None at bed side.  Disposition Plan:  Anticipate discharge back to previous home environment Consults called:  none Admission status: SDU/obs  Date of Service 12/04/2017    Ivor Costa [facility] Pager [phone]  If 7PM-7AM, please contact night-coverage [email or web address] Password [facility] 12/04/2017, 11:21 PM

## 2017-12-04 NOTE — ED Provider Notes (Signed)
[facility] EMERGENCY DEPARTMENT Provider Note   CSN: [id number] Arrival date & time: 12/04/17  1654     History   Chief Complaint Chief Complaint  Patient presents with  . Leg Pain    positive for dvt  . Shortness of Breath    HPI Brittney Valentine is a 77 y.o. female.  HPI Patient is a 77 year old female who was sent to the emergency department from her primary care physician's clinic today after being found to have a large left lower extremity DVT.  Patient reports increasing left lower extremity swelling over the past 2 to 3 days.  She also reports some mild exertional shortness of breath over the past several weeks.  No history of DVT or pulmonary embolism.  No recent injury or trauma.  No active cancer.  No recent immobilization or hospitalization.  No chest pain.  She had one syncopal episode several weeks ago but has had none since then.  No palpitations.  She previously was on Eliquis but she was taken off of this for ureteral stenting secondary to kidney stone.   Past Medical History:  Diagnosis Date  . Acute UTI   . Anemia   . Blood transfusion without reported diagnosis   . Cirrhosis ([place])   . Degenerative disk disease    knees  . Diverticulosis   . Dysrhythmia   . GERD (gastroesophageal reflux disease)   . Hemorrhoids   . History of kidney stones   . Hypertension   . Hypothyroidism   . Obesity   . Paroxysmal atrial fibrillation (HCC)   . Partial small bowel obstruction ([place])   . Personal history of colonic polyps 03/25/2012   8 mm rectal adenoma 03/2012  . Portal hypertensive gastropathy ([place])   . Shingles   . Thyroid disease   . Varices, esophageal ([place])   . Zoster     Patient Active Problem List   Diagnosis Date Noted  . Ascites 09/29/2017  . Chronic diarrhea 09/04/2017  . Long term current use of anticoagulant - apixaban 09/04/2017  . Fatigue 05/28/2017  . Chronic combined systolic and diastolic CHF (congestive heart failure)  ([place])   . Dilated cardiomyopathy ([place])   . Contraindication to anticoagulation therapy   . Reactive airway disease, mild intermittent, uncomplicated 75/03/2584  . Obstructive sleep apnea 01/08/2015  . Essential hypertension 12/21/2014  . Prediabetes 12/21/2014  . Hypothyroidism 12/21/2014  . Encounter for Medicare annual wellness exam 12/20/2014  . Medication management 09/12/2014  . Vitamin D deficiency 09/12/2014  . Atrial fibrillation ([place]) 05/11/2014  . Esophageal varices ([place]) 02/09/2014  . Left sided sciatica 02/09/2014  . History of colonic polyps 03/25/2012  . Obesity 09/27/2009  . History of small bowel obstruction 12/31/2007  . Hepatic cirrhosis ([place]) 12/31/2007  . PORTAL HYPERTENSION 12/31/2007    Past Surgical History:  Procedure Laterality Date  . ABDOMINAL HYSTERECTOMY    . APPENDECTOMY  1991  . CHOLECYSTECTOMY  2000  . COLON SURGERY    . COLON SURGERY    . COLONOSCOPY    . CYSTOSCOPY     10-26-17 budyzn  . CYSTOSCOPY/URETEROSCOPY/HOLMIUM LASER/STENT PLACEMENT Left 10/26/2017   Procedure: CYSTOSCOPY, URETEROSCOPY/RETROGRADE/STENT PLACEMENT;  Surgeon: Nickie Retort, MD;  Location: [facility];  Service: Urology;  Laterality: Left;  NEEDS DIGITAL URETEROSCOPE  . CYSTOSCOPY/URETEROSCOPY/HOLMIUM LASER/STENT PLACEMENT Left 11/12/2017   Procedure: CYSTOSCOPY LEFT RETROGRADE PYELOGRAM LEFT URETEROSCOPY HOLMIUM LASER AND LEFT URETERAL STENT EXCHANGE AND LASER ENDO URETEROTOMY;  Surgeon: Ardis Hughs, MD;  Location: [facility];  Service: Urology;  Laterality: Left;  . ESOPHAGOGASTRODUODENOSCOPY  multiple     OB History   None      Home Medications    Prior to Admission medications   Medication Sig Start Date End Date Taking? Authorizing Provider  Cholecalciferol (VITAMIN D-3) 1000 UNITS CAPS Take 1,000 Units by mouth 2 (two) times daily.     [provider]  furosemide (LASIX) 20 MG tablet Take 20 mg by mouth 2 (two) times daily.    [provider]  levothyroxine (SYNTHROID, LEVOTHROID) 112 MCG tablet TAKE 1 TABLET BY MOUTH IN THE MORNING Patient taking differently: Take 168 mcg by mouth daily on Monday, Wednesday and Friday. Take 112 mcg by mouth daily on all other days 09/05/17   Unk Pinto, MD  metoprolol succinate (TOPROL-XL) 100 MG 24 hr tablet Take 2 tablets  daily Patient taking differently: Take 200 mg by mouth daily.  10/29/17   Unk Pinto, MD  ranitidine (ZANTAC) 300 MG tablet Take 1 tablet (300 mg total) at bedtime by mouth. Patient taking differently: Take 300 mg by mouth at bedtime as needed for heartburn.  04/22/17 04/22/18  Vicie Mutters, PA-C    Family History Family History  Problem Relation Age of Onset  . Heart failure Mother        died from  . Hypertension Mother   . Heart attack Father        died from  . Hypertension Sister     Social History Social History   Tobacco Use  . Smoking status: Never Smoker  . Smokeless tobacco: Never Used  Substance Use Topics  . Alcohol use: No  . Drug use: No     Allergies   Amoxicillin; Ciprofloxacin; Sulfonamide derivatives; Robaxin [methocarbamol]; and Verapamil   Review of Systems Review of Systems  All other systems reviewed and are negative.    Physical Exam Updated Vital Signs BP 127/84   Pulse (!) 120   Temp 97.8 F (36.6 C) (Oral)   Ht 5\' 5"  (1.651 m)   Wt 96.6 kg (213 lb)   SpO2 100%   BMI 35.45 kg/m   Physical Exam  Constitutional: She is oriented to person, place, and time. She appears well-developed and well-nourished. No distress.  HENT:  Head: Normocephalic and atraumatic.  Eyes: EOM are normal.  Neck: Normal range of motion.  Cardiovascular: Normal rate, regular rhythm and normal heart sounds.  Pulmonary/Chest: Effort normal and breath sounds normal.  Abdominal: Soft. She exhibits no distension. There is no tenderness.  Musculoskeletal: Normal range of motion.  Left lower extremity swelling as compared to right.   Normal pulses left foot.  No discoloration of the left lower extremity  Neurological: She is alert and oriented to person, place, and time.  Skin: Skin is warm and dry.  Psychiatric: She has a normal mood and affect. Judgment normal.  Nursing note and vitals reviewed.    ED Treatments / Results  Labs (all labs ordered are listed, but only abnormal results are displayed) Labs Reviewed  COMPREHENSIVE METABOLIC PANEL - Abnormal; Notable for the following components:      Result Value   Glucose, Bld 101 (*)    BUN 29 (*)    Creatinine, Ser 1.37 (*)    Total Protein 6.3 (*)    Albumin 2.6 (*)    AST 99 (*)    ALT 50 (*)    Total Bilirubin 2.7 (*)    GFR calc non  Af Amer 37 (*)    GFR calc Af Amer 43 (*)    All other components within normal limits  CBC WITH DIFFERENTIAL/PLATELET - Abnormal; Notable for the following components:   RBC 5.15 (*)    Hemoglobin 15.6 (*)    HCT 48.1 (*)    RDW 23.1 (*)    Monocytes Absolute 1.3 (*)    All other components within normal limits  I-STAT CHEM 8, ED - Abnormal; Notable for the following components:   BUN 36 (*)    Creatinine, Ser 1.10 (*)    Hemoglobin 17.7 (*)    HCT 52.0 (*)    All other components within normal limits  HEPARIN LEVEL (UNFRACTIONATED)  CBC  I-STAT TROPONIN, ED    EKG None  Radiology Ct Angio Chest Pe W/cm &/or Wo Cm  Result Date: 12/04/2017 CLINICAL DATA:  Shortness of breath on exertion, history of deep venous thrombosis. EXAM: CT ANGIOGRAPHY CHEST WITH CONTRAST TECHNIQUE: Multidetector CT imaging of the chest was performed using the standard protocol during bolus administration of intravenous contrast. Multiplanar CT image reconstructions and MIPs were obtained to evaluate the vascular anatomy. CONTRAST:  166mL ISOVUE-370 IOPAMIDOL (ISOVUE-370) INJECTION 76% COMPARISON:  09/09/2017 FINDINGS: Cardiovascular: Thoracic aorta demonstrates atherosclerotic calcifications without aneurysmal dilatation. Opacification is  limited precluding evaluation for any possible dissection. Mild cardiac enlargement is seen. No right heart strain is noted. The pulmonary artery shows a normal branching pattern with filling defects in the left lower and upper lobes as well as significant filling defects within the right lower lobe consistent with pulmonary emboli. Mediastinum/Nodes: The esophagus is within normal limits. No significant hilar or mediastinal adenopathy is noted. Thoracic inlet is within normal limits. Lungs/Pleura: Left lung is well aerated. Minimal left pleural effusion is noted. Mild left basilar atelectasis is seen. The right lung is also well aerated. A large right-sided pleural effusion is noted which is increased in the interval from the prior CT of the abdomen and pelvis. Right lower lobe consolidation is noted related to the underlying pulmonary embolus. This has increased in the interval from the prior exam. Upper Abdomen: Visualized upper abdomen demonstrates cirrhotic change of the liver is well as ascites. There is at least 1 lesion identified within the left lobe of the liver stable in appearance from the prior exam. This has been previously evaluated utilizing MR the abdomen. No other significant upper abdominal abnormality is seen. Musculoskeletal: Degenerative changes of the thoracic spine are noted. Review of the MIP images confirms the above findings. IMPRESSION: Changes consistent with bilateral pulmonary emboli right greater than left with associated changes in the lower lobes bilaterally as well as bilateral pleural effusions right considerably greater than left. No right heart strain is noted. Changes in the upper abdomen consistent with cirrhosis and ascites. Stable benign hepatic lesion is noted. Aortic Atherosclerosis (ICD10-I70.0). Critical Value/emergent results were called by telephone at the time of interpretation on 12/04/2017 at 8:19 pm to [facility] , who verbally acknowledged these results.  Electronically Signed   By: Inez Catalina M.D.   On: 12/04/2017 20:21    Procedures .Critical Care Performed by: Jola Schmidt, MD Authorized by: Jola Schmidt, MD     CRITICAL CARE Performed by: Jola Schmidt Total critical care time: 33 minutes Critical care time was exclusive of separately billable procedures and treating other patients. Critical care was necessary to treat or prevent imminent or life-threatening deterioration. Critical care was time spent personally by me on the following activities: development  of treatment plan with patient and/or surrogate as well as nursing, discussions with consultants, evaluation of patient's response to treatment, examination of patient, obtaining history from patient or surrogate, ordering and performing treatments and interventions, ordering and review of laboratory studies, ordering and review of radiographic studies, pulse oximetry and re-evaluation of patient's condition.   Medications Ordered in ED Medications  iopamidol (ISOVUE-370) 76 % injection (has no administration in time range)  heparin ADULT infusion 100 units/mL (25000 units/267mL sodium chloride 0.45%) (1,300 Units/hr Intravenous New Bag/Given 12/04/17 1913)  iopamidol (ISOVUE-370) 76 % injection 100 mL (100 mLs Intravenous Contrast Given 12/04/17 1956)  heparin bolus via infusion 4,000 Units (4,000 Units Intravenous Bolus from Bag 12/04/17 1913)     Initial Impression / Assessment and Plan / ED Course  I have reviewed the triage vital signs and the nursing notes.  Pertinent labs & imaging results that were available during my care of the patient were reviewed by me and considered in my medical decision making (see chart for details).     Patient with known left lower extremity DVT diagnosed today on outpatient ultrasound imaging.  Sent to the ER for further evaluation.  Here she has exertional shortness of breath and tachycardia.  CT angios chest demonstrates evidence of  bilateral pulmonary emboli.  And an enlarging pleural effusion.  She will need additional work-up as this appears to be an unprovoked DVT and pulmonary embolism.  She may benefit from thoracentesis in the hospital.  Patient will be initiated on heparin at this time.  Admit to the hospital.  No hypotension.    Final Clinical Impressions(s) / ED Diagnoses   Final diagnoses:  Acute pulmonary embolism without acute cor pulmonale, unspecified pulmonary embolism type [facility])    ED Discharge Orders    None       Jola Schmidt, MD 12/04/17 2036

## 2017-12-04 NOTE — ED Notes (Signed)
Patient transported to CT 

## 2017-12-04 NOTE — Telephone Encounter (Signed)
Per Dr. Rayann Heman pt should be admitted to ED for further workup of positive DVT during testing.

## 2017-12-04 NOTE — Addendum Note (Signed)
Addended by: Eulis Canner on: 12/04/2017 08:36 AM   Modules accepted: Orders

## 2017-12-04 NOTE — Addendum Note (Signed)
Encounter addended by: Sherran Needs, NP on: 12/04/2017 9:01 AM  Actions taken: Sign clinical note

## 2017-12-05 ENCOUNTER — Other Ambulatory Visit: Payer: Self-pay | Admitting: Internal Medicine

## 2017-12-05 DIAGNOSIS — I361 Nonrheumatic tricuspid (valve) insufficiency: Secondary | ICD-10-CM | POA: Diagnosis not present

## 2017-12-05 DIAGNOSIS — I472 Ventricular tachycardia: Secondary | ICD-10-CM | POA: Diagnosis present

## 2017-12-05 DIAGNOSIS — J9811 Atelectasis: Secondary | ICD-10-CM | POA: Diagnosis present

## 2017-12-05 DIAGNOSIS — Z881 Allergy status to other antibiotic agents status: Secondary | ICD-10-CM | POA: Diagnosis not present

## 2017-12-05 DIAGNOSIS — I480 Paroxysmal atrial fibrillation: Secondary | ICD-10-CM | POA: Diagnosis present

## 2017-12-05 DIAGNOSIS — R627 Adult failure to thrive: Secondary | ICD-10-CM | POA: Diagnosis present

## 2017-12-05 DIAGNOSIS — R188 Other ascites: Secondary | ICD-10-CM | POA: Diagnosis present

## 2017-12-05 DIAGNOSIS — I5042 Chronic combined systolic (congestive) and diastolic (congestive) heart failure: Secondary | ICD-10-CM | POA: Diagnosis present

## 2017-12-05 DIAGNOSIS — I420 Dilated cardiomyopathy: Secondary | ICD-10-CM | POA: Diagnosis present

## 2017-12-05 DIAGNOSIS — N183 Chronic kidney disease, stage 3 (moderate): Secondary | ICD-10-CM | POA: Diagnosis not present

## 2017-12-05 DIAGNOSIS — I2699 Other pulmonary embolism without acute cor pulmonale: Secondary | ICD-10-CM | POA: Diagnosis present

## 2017-12-05 DIAGNOSIS — I82412 Acute embolism and thrombosis of left femoral vein: Secondary | ICD-10-CM | POA: Diagnosis present

## 2017-12-05 DIAGNOSIS — K5790 Diverticulosis of intestine, part unspecified, without perforation or abscess without bleeding: Secondary | ICD-10-CM | POA: Diagnosis present

## 2017-12-05 DIAGNOSIS — I340 Nonrheumatic mitral (valve) insufficiency: Secondary | ICD-10-CM | POA: Diagnosis not present

## 2017-12-05 DIAGNOSIS — K766 Portal hypertension: Secondary | ICD-10-CM | POA: Diagnosis present

## 2017-12-05 DIAGNOSIS — Z882 Allergy status to sulfonamides status: Secondary | ICD-10-CM | POA: Diagnosis not present

## 2017-12-05 DIAGNOSIS — I481 Persistent atrial fibrillation: Secondary | ICD-10-CM | POA: Diagnosis not present

## 2017-12-05 DIAGNOSIS — E669 Obesity, unspecified: Secondary | ICD-10-CM | POA: Diagnosis present

## 2017-12-05 DIAGNOSIS — K219 Gastro-esophageal reflux disease without esophagitis: Secondary | ICD-10-CM | POA: Diagnosis present

## 2017-12-05 DIAGNOSIS — I130 Hypertensive heart and chronic kidney disease with heart failure and stage 1 through stage 4 chronic kidney disease, or unspecified chronic kidney disease: Secondary | ICD-10-CM | POA: Diagnosis present

## 2017-12-05 DIAGNOSIS — Z87442 Personal history of urinary calculi: Secondary | ICD-10-CM | POA: Diagnosis not present

## 2017-12-05 DIAGNOSIS — K7460 Unspecified cirrhosis of liver: Secondary | ICD-10-CM | POA: Diagnosis present

## 2017-12-05 DIAGNOSIS — E039 Hypothyroidism, unspecified: Secondary | ICD-10-CM | POA: Diagnosis present

## 2017-12-05 DIAGNOSIS — I429 Cardiomyopathy, unspecified: Secondary | ICD-10-CM | POA: Diagnosis not present

## 2017-12-05 DIAGNOSIS — I82890 Acute embolism and thrombosis of other specified veins: Secondary | ICD-10-CM | POA: Diagnosis present

## 2017-12-05 DIAGNOSIS — I82432 Acute embolism and thrombosis of left popliteal vein: Secondary | ICD-10-CM | POA: Diagnosis present

## 2017-12-05 DIAGNOSIS — Z6836 Body mass index (BMI) 36.0-36.9, adult: Secondary | ICD-10-CM | POA: Diagnosis not present

## 2017-12-05 DIAGNOSIS — I272 Pulmonary hypertension, unspecified: Secondary | ICD-10-CM | POA: Diagnosis present

## 2017-12-05 DIAGNOSIS — K7581 Nonalcoholic steatohepatitis (NASH): Secondary | ICD-10-CM | POA: Diagnosis present

## 2017-12-05 LAB — CBC
HEMATOCRIT: 39.6 % (ref 36.0–46.0)
HEMOGLOBIN: 13 g/dL (ref 12.0–15.0)
MCH: 30.4 pg (ref 26.0–34.0)
MCHC: 32.8 g/dL (ref 30.0–36.0)
MCV: 92.5 fL (ref 78.0–100.0)
Platelets: 302 10*3/uL (ref 150–400)
RBC: 4.28 MIL/uL (ref 3.87–5.11)
RDW: 22.6 % — ABNORMAL HIGH (ref 11.5–15.5)
WBC: 9.8 10*3/uL (ref 4.0–10.5)

## 2017-12-05 LAB — BASIC METABOLIC PANEL
Anion gap: 6 (ref 5–15)
BUN: 31 mg/dL — AB (ref 8–23)
CHLORIDE: 112 mmol/L — AB (ref 98–111)
CO2: 20 mmol/L — AB (ref 22–32)
Calcium: 9.3 mg/dL (ref 8.9–10.3)
Creatinine, Ser: 1.24 mg/dL — ABNORMAL HIGH (ref 0.44–1.00)
GFR calc Af Amer: 48 mL/min — ABNORMAL LOW (ref >60)
GFR calc non Af Amer: 42 mL/min — ABNORMAL LOW (ref >60)
Glucose, Bld: 75 mg/dL (ref 70–99)
POTASSIUM: 4.8 mmol/L (ref 3.5–5.1)
Sodium: 138 mmol/L (ref 135–145)

## 2017-12-05 LAB — MRSA PCR SCREENING: MRSA BY PCR: NEGATIVE

## 2017-12-05 LAB — URINE CULTURE
MICRO NUMBER: 90776456
SPECIMEN QUALITY:: ADEQUATE

## 2017-12-05 LAB — HEPARIN LEVEL (UNFRACTIONATED)
HEPARIN UNFRACTIONATED: 0.8 [IU]/mL — AB (ref 0.30–0.70)
Heparin Unfractionated: 0.8 IU/mL — ABNORMAL HIGH (ref 0.30–0.70)

## 2017-12-05 LAB — TSH: TSH: 9.266 u[IU]/mL — ABNORMAL HIGH (ref 0.350–4.500)

## 2017-12-05 MED ORDER — APIXABAN 5 MG PO TABS: 5.0000 mg | ORAL_TABLET | Freq: Two times a day (BID) | ORAL | Status: DC

## 2017-12-05 MED ORDER — APIXABAN 5 MG PO TABS
10.0000 mg | ORAL_TABLET | Freq: Two times a day (BID) | ORAL | Status: DC
  Administered 2017-12-05 – 2017-12-06 (×2): 10 mg via ORAL
  Filled 2017-12-05 (×3): qty 2

## 2017-12-05 MED ORDER — LEVALBUTEROL HCL 1.25 MG/0.5ML IN NEBU
1.2500 mg | INHALATION_SOLUTION | Freq: Four times a day (QID) | RESPIRATORY_TRACT | Status: DC
  Administered 2017-12-05: 1.25 mg via RESPIRATORY_TRACT
  Filled 2017-12-05 (×2): qty 0.5

## 2017-12-05 MED ORDER — METOPROLOL SUCCINATE ER 25 MG PO TB24: 12.5000 mg | ORAL_TABLET | Freq: Every day | ORAL | Status: DC

## 2017-12-05 MED ORDER — LEVALBUTEROL HCL 1.25 MG/0.5ML IN NEBU: 1.2500 mg | INHALATION_SOLUTION | Freq: Four times a day (QID) | RESPIRATORY_TRACT | Status: DC | PRN

## 2017-12-05 MED ORDER — LEVOTHYROXINE SODIUM 112 MCG PO TABS
112.0000 ug | ORAL_TABLET | Freq: Every day | ORAL | Status: DC
  Administered 2017-12-06: 112 ug via ORAL
  Filled 2017-12-05: qty 1

## 2017-12-05 NOTE — Progress Notes (Signed)
Pt. admitted from ER via stretcher to 5W-15; alert and oriented x4; oriented to call button and reviewed POC and orders with pt.

## 2017-12-05 NOTE — Plan of Care (Signed)
  Problem: Education: Goal: Knowledge of General Education information will improve Outcome: Progressing Note:  POC and orders reviewed with pt.   

## 2017-12-05 NOTE — Progress Notes (Signed)
ANTICOAGULATION CONSULT NOTE - Follow Up Consult  Pharmacy Consult for heparin Indication: PE/DVT  Labs: Recent Labs    12/03/17 1720 12/04/17 1743 12/04/17 1801 12/05/17 0258  HGB 14.8 15.6* 17.7* 13.0  HCT 42.1 48.1* 52.0* 39.6  PLT 383 388  --  302  HEPARINUNFRC  --   --   --  0.80*  CREATININE 1.10* 1.37* 1.10*  --     Assessment: 77yo female supratherapeutic on heparin with initial dosing for VTE; RN notes no signs of bleeding.  Goal of Therapy:  Heparin level 0.3-0.7 units/ml   Plan:  Will decrease heparin gtt by 1 units/kg/hr to 1200 units/hr and check level in 6 hours.    Wynona Neat, PharmD, BCPS  12/05/2017,3:46 AM

## 2017-12-05 NOTE — Progress Notes (Signed)
ANTICOAGULATION CONSULT NOTE - Follow Up Consult  Pharmacy Consult for Heparin Indication: DVT/PE  Allergies  Allergen Reactions  . Amoxicillin Palpitations  . Ciprofloxacin Swelling  . Sulfonamide Derivatives Other (See Comments)    See little dots, skin feels like its burning  . Robaxin [Methocarbamol] Palpitations  . Verapamil Palpitations    Patient Measurements: Height: 5\' 5"  (165.1 cm) Weight: 220 lb 3.8 oz (99.9 kg) IBW/kg (Calculated) : 57  Vital Signs: Temp: 97.6 F (36.4 C) (06/29 0800) Temp Source: Oral (06/29 0800) BP: 101/67 (06/29 0800) Pulse Rate: 107 (06/29 0800)  Labs: Recent Labs    12/03/17 1720 12/04/17 1743 12/04/17 1801 12/05/17 0258 12/05/17 0937  HGB 14.8 15.6* 17.7* 13.0  --   HCT 42.1 48.1* 52.0* 39.6  --   PLT 383 388  --  302  --   HEPARINUNFRC  --   --   --  0.80* 0.80*  CREATININE 1.10* 1.37* 1.10* 1.24*  --     Estimated Creatinine Clearance: 46.6 mL/min (A) (by C-G formula based on SCr of 1.24 mg/dL (H)).   Assessment: Anticoag: No anticoag PTA.  Heparin for extensive DVT of LLE (CTA pending) +B PE on CT. HL 0.8 again (rate not charted as decreased overnight to 12 but RN said pump running correctly)  Goal of Therapy:  Heparin level 0.3-0.7 units/ml Monitor platelets by anticoagulation protocol: Yes   Plan:  Decrease IV heparin to 1050 units/hr Recheck HL in 6-8 hrs Daily HL and CBC  Nesreen Albano S. Alford Highland, PharmD, BCPS Clinical Staff Pharmacist Pager [phone]  Eilene Ghazi Stillinger 12/05/2017,10:56 AM

## 2017-12-05 NOTE — Progress Notes (Signed)
[facility] for Heparin Indication: DVT/PE  Allergies  Allergen Reactions  . Amoxicillin Palpitations  . Ciprofloxacin Swelling  . Sulfonamide Derivatives Other (See Comments)    See little dots, skin feels like its burning  . Robaxin [Methocarbamol] Palpitations  . Verapamil Palpitations    Patient Measurements: Height: 5\' 5"  (165.1 cm) Weight: 220 lb 3.8 oz (99.9 kg) IBW/kg (Calculated) : 57  Vital Signs: Temp: 97.7 F (36.5 C) (06/29 1405) Temp Source: Oral (06/29 1405) BP: 94/73 (06/29 1405) Pulse Rate: 104 (06/29 1405)  Labs: Recent Labs    12/03/17 1720 12/04/17 1743 12/04/17 1801 12/05/17 0258 12/05/17 0937  HGB 14.8 15.6* 17.7* 13.0  --   HCT 42.1 48.1* 52.0* 39.6  --   PLT 383 388  --  302  --   HEPARINUNFRC  --   --   --  0.80* 0.80*  CREATININE 1.10* 1.37* 1.10* 1.24*  --      Assessment:  77 yo female with extensive DVT of his left lower extremity and bilateral PE seen on CTA. Pt was previously on anticoagulation for an old pulmonary embolism, this was held about 1 month ago for a urologic stenting procedure and was never resumed. Pt has been on heparin and will now be transitioned to Eliquis.   Goal of Therapy:  Heparin level 0.3-0.7 units/ml Monitor platelets by anticoagulation protocol: Yes    Plan:  -Stop heparin, administer Eliquis at the same time heparin is discontinued -Eliquis 10 mg po bid x 7days then 5 mg po bid -Monitor s/sx bleeding -Discussed with RN    Ryker [place], Jake Church 12/05/2017,5:15 PM

## 2017-12-05 NOTE — ED Notes (Signed)
Attempted to call report at this time.  Nurse to call back when available. 

## 2017-12-05 NOTE — Progress Notes (Addendum)
PROGRESS NOTE  Brittney Valentine:[id number] DOB: 11/30/1940 DOA: 12/04/2017 PCP: Unk Pinto, MD  HPI/Recap of past 24 hours:  bp low normal, decrease toprol dose She denies chest pain, no sob, left leg edema  Assessment/Plan: Principal Problem:   PE (pulmonary thromboembolism) ([place]) Active Problems:   Hepatic cirrhosis (HCC)   Atrial fibrillation with RVR ([place])   Essential hypertension   Hypothyroidism   Chronic combined systolic and diastolic CHF (congestive heart failure) (HCC)   Ascites   GERD (gastroesophageal reflux disease)   DVT (deep venous thrombosis) (HCC)   Pleural effusion  Bilateral PE/left lower extremity DVT -she was on elliquis for afib/stroke prevention, she was taken off elliquis due to planned kidney stone removal on 6/6, she has been off eliquis in anticipating colonoscopy/EGD in the near further. -she reports syncope episode at home after the procedure on 6/6 which she though it might be related to anesthesia,  -she was sent to ED on 6/19 by her pmd due to generalized weakness and possible uti, - she returned to cardiology office on 6/27 with left leg edema, she is found to have DVT and sent to the hospital  -she reports is up to date with mamogram, GI Dr Carlean Purl is planning to get colonoscopy and EGD while she is off anticoagulation  -she denies family history of clotting disorder, she does not smoke -she is started on heparin drip, vital signs are stable, she is feeling better, she wants to be transitioned off heparin drip ASAP due to not wanting to have frequent blood draw, will transition to eliquis -echo pending  Afib, h/o ventricular tachycardia ( was on amiodarone /flecainide) -she presented with afib/rvr, now rate improved, but Remain in afib -Per cardiology office note,  plan to do sotalol after 3weeks of anticoagulation -Due to low bp, will decrease betablocker dose, will consult cardiology  Chronic combined diastolic and systolic  chf Last ef from 04/2017 was 35-40% Repeat echo  Bilateral pleural effusions  right > left, + ascites  Likely from combination of chf and cirrhosis Continue lasix, consider adding spironolactone when bp improves  NASH/cirrhosis/h/o esophageal varices/ascites Mild chronically elevated lft/ammonia, No confusion/plt wnl Stable benign hepatic lesion  Denies h/o gi bleed She is followed closely by GI Dr Carlean Purl  CKDII/III Renal dosing meds, monitor renal function   HTN:  Her blood pressure is low normal, will reduce betablocker    Hypothyroidism: tsh 9.26 Will discuss with patient about increasing synthroid  FTT/ generalized weakness Will get PT eval  Code Status: partial  Family Communication: patient   Disposition Plan: home 1-2 days, pending clinical improvement, need cardiology consult, need pt eval, echo pending   Consultants:  cardiology  Procedures:  none  Antibiotics:  none   Objective: BP 101/67   Pulse (!) 107   Temp 97.6 F (36.4 C) (Oral)   Resp (!) 25   Ht 5\' 5"  (1.651 m)   Wt 99.9 kg (220 lb 3.8 oz)   SpO2 99%   BMI 36.65 kg/m   Intake/Output Summary (Last 24 hours) at 12/05/2017 1118 Last data filed at 12/05/2017 1052 Gross per 24 hour  Intake 235.73 ml  Output -  Net 235.73 ml   Filed Weights   12/04/17 1718 12/05/17 0421  Weight: 96.6 kg (213 lb) 99.9 kg (220 lb 3.8 oz)    Exam: Patient is examined daily including today on 12/05/2017, exams remain the same as of yesterday except that has changed    General:  Frail,  chronically ill but NAD, aaox3, she is hard of hearing  Cardiovascular: IRRR  Respiratory: CTABL  Abdomen: Soft/ND/NT, positive BS  Musculoskeletal: left lower extremity Edema  Neuro: alert, oriented   Data Reviewed: Basic Metabolic Panel: Recent Labs  Lab 12/03/17 1720 12/04/17 1743 12/04/17 1801 12/05/17 0258  NA 138 137 138 138  K 4.9 4.9 4.6 4.8  CL 107 108 107 112*  CO2 25 22  --  20*   GLUCOSE 89 101* 96 75  BUN 29* 29* 36* 31*  CREATININE 1.10* 1.37* 1.10* 1.24*  CALCIUM 9.5 9.8  --  9.3   Liver Function Tests: Recent Labs  Lab 12/03/17 1720 12/04/17 1743  AST 78* 99*  ALT 43* 50*  ALKPHOS  --  95  BILITOT 2.5* 2.7*  PROT 5.9* 6.3*  ALBUMIN  --  2.6*   No results for input(s): LIPASE, AMYLASE in the last 168 hours. Recent Labs  Lab 12/04/17 2142  AMMONIA 41*   CBC: Recent Labs  Lab 12/03/17 1720 12/04/17 1743 12/04/17 1801 12/05/17 0258  WBC 10.7 9.9  --  9.8  NEUTROABS 7,362 7.4  --   --   HGB 14.8 15.6* 17.7* 13.0  HCT 42.1 48.1* 52.0* 39.6  MCV 86.8 93.4  --  92.5  PLT 383 388  --  302   Cardiac Enzymes:   No results for input(s): CKTOTAL, CKMB, CKMBINDEX, TROPONINI in the last 168 hours. BNP (last 3 results) Recent Labs    04/14/17 1125 12/04/17 2142  BNP 603.4* 289.5*    ProBNP (last 3 results) No results for input(s): PROBNP in the last 8760 hours.  CBG: No results for input(s): GLUCAP in the last 168 hours.  Recent Results (from the past 240 hour(s))  Urine culture     Status: Abnormal   Collection Time: 11/25/17 11:00 PM  Result Value Ref Range Status   Specimen Description URINE, CLEAN CATCH  Final   Special Requests   Final    NONE Performed at [facility], [address], [place], [place] [address]    Culture >=100,000 COLONIES/mL KLEBSIELLA PNEUMONIAE (A)  Final   Report Status 11/28/2017 FINAL  Final   Organism ID, Bacteria KLEBSIELLA PNEUMONIAE (A)  Final      Susceptibility   Klebsiella pneumoniae - MIC*    AMPICILLIN >=32 RESISTANT Resistant     CEFAZOLIN <=4 SENSITIVE Sensitive     CEFTRIAXONE <=1 SENSITIVE Sensitive     CIPROFLOXACIN 1 SENSITIVE Sensitive     GENTAMICIN <=1 SENSITIVE Sensitive     IMIPENEM <=0.25 SENSITIVE Sensitive     NITROFURANTOIN 64 INTERMEDIATE Intermediate     TRIMETH/SULFA >=320 RESISTANT Resistant     AMPICILLIN/SULBACTAM 4 SENSITIVE Sensitive     PIP/TAZO <=4 SENSITIVE  Sensitive     Extended ESBL NEGATIVE Sensitive     * >=100,000 COLONIES/mL KLEBSIELLA PNEUMONIAE  MRSA PCR Screening     Status: None   Collection Time: 12/05/17  4:27 AM  Result Value Ref Range Status   MRSA by PCR NEGATIVE NEGATIVE Final    Comment:        The GeneXpert MRSA Assay (FDA approved for NASAL specimens only), is one component of a comprehensive MRSA colonization surveillance program. It is not intended to diagnose MRSA infection nor to guide or monitor treatment for MRSA infections. Performed at [facility], [place] [address], [place],  [address]      Studies: Ct Angio Chest Pe W/cm &/or Wo Cm  Result Date: 12/04/2017 CLINICAL DATA:  Shortness of breath on exertion, history of deep venous thrombosis. EXAM: CT ANGIOGRAPHY CHEST WITH CONTRAST TECHNIQUE: Multidetector CT imaging of the chest was performed using the standard protocol during bolus administration of intravenous contrast. Multiplanar CT image reconstructions and MIPs were obtained to evaluate the vascular anatomy. CONTRAST:  149mL ISOVUE-370 IOPAMIDOL (ISOVUE-370) INJECTION 76% COMPARISON:  09/09/2017 FINDINGS: Cardiovascular: Thoracic aorta demonstrates atherosclerotic calcifications without aneurysmal dilatation. Opacification is limited precluding evaluation for any possible dissection. Mild cardiac enlargement is seen. No right heart strain is noted. The pulmonary artery shows a normal branching pattern with filling defects in the left lower and upper lobes as well as significant filling defects within the right lower lobe consistent with pulmonary emboli. Mediastinum/Nodes: The esophagus is within normal limits. No significant hilar or mediastinal adenopathy is noted. Thoracic inlet is within normal limits. Lungs/Pleura: Left lung is well aerated. Minimal left pleural effusion is noted. Mild left basilar atelectasis is seen. The right lung is also well aerated. A large right-sided pleural effusion is  noted which is increased in the interval from the prior CT of the abdomen and pelvis. Right lower lobe consolidation is noted related to the underlying pulmonary embolus. This has increased in the interval from the prior exam. Upper Abdomen: Visualized upper abdomen demonstrates cirrhotic change of the liver is well as ascites. There is at least 1 lesion identified within the left lobe of the liver stable in appearance from the prior exam. This has been previously evaluated utilizing MR the abdomen. No other significant upper abdominal abnormality is seen. Musculoskeletal: Degenerative changes of the thoracic spine are noted. Review of the MIP images confirms the above findings. IMPRESSION: Changes consistent with bilateral pulmonary emboli right greater than left with associated changes in the lower lobes bilaterally as well as bilateral pleural effusions right considerably greater than left. No right heart strain is noted. Changes in the upper abdomen consistent with cirrhosis and ascites. Stable benign hepatic lesion is noted. Aortic Atherosclerosis (ICD10-I70.0). Critical Value/emergent results were called by telephone at the time of interpretation on 12/04/2017 at 8:19 pm to [facility] , who verbally acknowledged these results. Electronically Signed   By: Inez Catalina M.D.   On: 12/04/2017 20:21    Scheduled Meds: . cholecalciferol  1,000 Units Oral BID  . famotidine  20 mg Oral QHS  . furosemide  20 mg Oral BID  . lactulose  10 g Oral Daily  . [START ON 12/06/2017] levothyroxine  112 mcg Oral QAC breakfast  . [START ON 12/06/2017] metoprolol succinate  12.5 mg Oral Daily    Continuous Infusions: . heparin 1,050 Units/hr (12/05/17 1052)     Time spent: 79mins I have personally reviewed and interpreted on  12/05/2017 daily labs, tele strips, imagings as discussed above under date review session and assessment and plans.  I reviewed all nursing notes, pharmacy notes, vitals, pertinent old  records  I have discussed plan of care as described above with RN , patient  on 12/05/2017   Florencia Reasons MD, PhD  [facility] Pager [phone]. If 7PM-7AM, please contact night-coverage at [email or web address], password [facility] 12/05/2017, 11:18 AM  LOS: 0 days

## 2017-12-06 ENCOUNTER — Inpatient Hospital Stay (HOSPITAL_COMMUNITY): Payer: PPO

## 2017-12-06 ENCOUNTER — Other Ambulatory Visit: Payer: Self-pay

## 2017-12-06 DIAGNOSIS — I429 Cardiomyopathy, unspecified: Secondary | ICD-10-CM

## 2017-12-06 DIAGNOSIS — K7460 Unspecified cirrhosis of liver: Secondary | ICD-10-CM

## 2017-12-06 DIAGNOSIS — I481 Persistent atrial fibrillation: Secondary | ICD-10-CM

## 2017-12-06 DIAGNOSIS — I340 Nonrheumatic mitral (valve) insufficiency: Secondary | ICD-10-CM

## 2017-12-06 DIAGNOSIS — I361 Nonrheumatic tricuspid (valve) insufficiency: Secondary | ICD-10-CM

## 2017-12-06 DIAGNOSIS — I2699 Other pulmonary embolism without acute cor pulmonale: Secondary | ICD-10-CM

## 2017-12-06 DIAGNOSIS — N183 Chronic kidney disease, stage 3 (moderate): Secondary | ICD-10-CM

## 2017-12-06 LAB — URINALYSIS, ROUTINE W REFLEX MICROSCOPIC
BILIRUBIN URINE: NEGATIVE
Glucose, UA: NEGATIVE mg/dL
Ketones, ur: NEGATIVE mg/dL
Nitrite: NEGATIVE
Protein, ur: NEGATIVE mg/dL
SPECIFIC GRAVITY, URINE: 1.032 — AB (ref 1.005–1.030)
WBC, UA: >50 WBC/hpf — ABNORMAL HIGH (ref 0–5)
pH: 6 (ref 5.0–8.0)

## 2017-12-06 LAB — ECHOCARDIOGRAM COMPLETE
Height: 65 in
WEIGHTICAEL: 3523.83 [oz_av]

## 2017-12-06 MED ORDER — APIXABAN 5 MG PO TABS: 5.0000 mg | ORAL_TABLET | Freq: Two times a day (BID) | ORAL | 0 refills | Status: DC

## 2017-12-06 MED ORDER — SPIRONOLACTONE 100 MG PO TABS: 100.0000 mg | ORAL_TABLET | Freq: Every day | ORAL | 0 refills | Status: DC

## 2017-12-06 MED ORDER — METOPROLOL SUCCINATE ER 25 MG PO TB24: 25.0000 mg | ORAL_TABLET | Freq: Every day | ORAL | 0 refills | Status: DC

## 2017-12-06 MED ORDER — METOPROLOL SUCCINATE ER 50 MG PO TB24: 50.0000 mg | ORAL_TABLET | Freq: Every day | ORAL | Status: DC

## 2017-12-06 MED ORDER — LEVOTHYROXINE SODIUM 75 MCG PO TABS
150.0000 ug | ORAL_TABLET | Freq: Every day | ORAL | Status: DC
Start: 2017-12-07 — End: 2017-12-06

## 2017-12-06 MED ORDER — APIXABAN 5 MG PO TABS: 10.0000 mg | ORAL_TABLET | Freq: Two times a day (BID) | ORAL | 0 refills | Status: DC

## 2017-12-06 MED ORDER — METOPROLOL SUCCINATE ER 25 MG PO TB24
25.0000 mg | ORAL_TABLET | Freq: Every day | ORAL | Status: DC
  Administered 2017-12-06: 25 mg via ORAL
  Filled 2017-12-06: qty 1

## 2017-12-06 MED ORDER — LACTULOSE 10 GM/15ML PO SOLN: 10.0000 g | Freq: Every day | ORAL | 0 refills | Status: DC

## 2017-12-06 MED ORDER — LEVOTHYROXINE SODIUM 150 MCG PO TABS: 150.0000 ug | ORAL_TABLET | Freq: Every day | ORAL | 11 refills | Status: DC

## 2017-12-06 MED ORDER — SPIRONOLACTONE 25 MG PO TABS
100.0000 mg | ORAL_TABLET | Freq: Every day | ORAL | Status: DC
  Administered 2017-12-06: 100 mg via ORAL
  Filled 2017-12-06: qty 4

## 2017-12-06 NOTE — Progress Notes (Signed)
Brittney Valentine to be D/C'd Home per MD order.  Discussed with the patient and all questions fully answered.  VSS, Skin clean, dry and intact without evidence of skin break down, no evidence of skin tears noted. IV catheter discontinued intact. Site without signs and symptoms of complications. Dressing and pressure applied.  An After Visit Summary was printed and given to the patient. Patient received prescription.  D/c education completed with patient/family including follow up instructions, medication list, d/c activities limitations if indicated, with other d/c instructions as indicated by MD - patient able to verbalize understanding, all questions fully answered.   Patient instructed to return to ED, call 911, or call MD for any changes in condition.   Patient escorted via [name], and D/C home via private auto.  [name] 12/06/2017 4:02 PM

## 2017-12-06 NOTE — Care Management Note (Signed)
Case Management Note Marvetta Gibbons RN, BSN Unit 4E-Case Manager-- [place] weekend coverage [phone]  Patient Details  Name: Brittney Valentine MRN: [id number] Date of Birth: 21-Jul-1940  Subjective/Objective:   Pt admitted with PE                 Action/Plan: PTA pt lived at home, referral received for [facility] needs and Eliquis- spoke with pt at bedside- per pt she has been on Eliquis before- copay cost $45- she does not believe she has ever used 30 day free card- card provided to pt to try to use on discharge (can use once per lifetime)- choice offered to pt for [facility] services- per pt she has used [facility] in past- would like to use them again- referral called to [facility] with [facility] for HHRN/PT.   Expected Discharge Date:  12/06/17               Expected Discharge Plan:  [place]  In-House Referral:  NA  Discharge planning Services  CM Consult, Medication Assistance  Post Acute Care Choice:  Home Health Choice offered to:  Patient  DME Arranged:    DME Agency:     HH Arranged:  RN, PT [place] Agency:  [place]  Status of Service:  Completed, signed off  If discussed at [place] of Stay Meetings, dates discussed:    Discharge Disposition: home/home health   Additional Comments:  Dawayne Patricia, RN 12/06/2017, 2:02 PM

## 2017-12-06 NOTE — Progress Notes (Signed)
Pt is refusing to have labs drawn. MD Erlinda Hong made aware. Will continue to assess.

## 2017-12-06 NOTE — Evaluation (Signed)
Physical Therapy Evaluation Patient Details Name: Brittney Valentine MRN: [id number] DOB: [birth year] Today's Date: 12/06/2017   History of Present Illness  Brittney Valentine is a 77 y.o. female with medical history significant of liver cirrhosis, ascites, esophageal varicose, portal hypertension, diverticulosis, obesity, PAF not on anticoagulants, CHF with EF of 35%, kidney stone, who presents with shortness of breath, left leg swelling. + DVT and bil PEs; Started on anticoagulation via IV, transitioning to oral 6/29  Clinical Impression   Pt admitted with above diagnosis. Pt currently with functional limitations due to the deficits listed below (see PT Problem List). Independent, driving, grocery shopping prior to admission; Currently presents with decr functional capacity, DOE; Lengthy discussion re: managing at home indpendently --  She can manage household distances, but my concern is for IADLs such as shopping, home management; Placed OT consult for assist with ADLs (she has specific concerns re: bathing and dressing), and energy conservation; Paged MD and Case Mgr with recommendations; Pt will benefit from skilled PT to increase their independence and safety with mobility to allow discharge to the venue listed below.       Follow Up Recommendations Home health PT;Supervision - Intermittent(HHOT and HHRN for chronic disease management)    Equipment Recommendations  Other (comment)(4 wheeled RW)    Recommendations for Other Services OT consult(ordered per protocol)     Precautions / Restrictions Precautions Precautions: Other (comment) Precaution Comments: Decr functional capacity Restrictions Weight Bearing Restrictions: No      Mobility  Bed Mobility Overal bed mobility: Modified Independent             General bed mobility comments: incr time  Transfers Overall transfer level: Needs assistance Equipment used: None Transfers: Sit to/from Stand Sit to Stand: Supervision          General transfer comment: Cues to self-monitor for activity tolerance  Ambulation/Gait Ambulation/Gait assistance: Min guard Gait Distance (Feet): 150 Feet Assistive device: None Gait Pattern/deviations: Step-through pattern;Decreased step length - right;Decreased step length - left;Decreased stride length Gait velocity: slowed   General Gait Details: Cues to self-monitor for activity tolernace; Walked on [employer] and O2 sats remained greater than or equal to 89%; Showing considerable fatigue towards the end of walk with incr lateral movement of [place], and DOE 3/4  Stairs            Wheelchair Mobility    Modified Rankin (Stroke Patients Only)       Balance Overall balance assessment: Mild deficits observed, not formally tested                                           Pertinent Vitals/Pain Pain Assessment: No/denies pain    Home Living Family/patient expects to be discharged to:: Private residence Living Arrangements: Alone Available Help at Discharge: Available PRN/intermittently(brother and neighbors) Type of Home: House Home Access: Stairs to enter Entrance Stairs-Rails: Psychiatric nurse of Steps: 3 Home Layout: One level Home Equipment: Shower seat;Hand held shower head;Grab bars - tub/shower Additional Comments: Unable to stay with sister this time    Prior Function Level of Independence: Independent;Independent with assistive device(s)         Comments: No need for assistive device until she used cane/RW approx 3 weeks prior to this admission     Hand Dominance        Extremity/Trunk Assessment  Upper Extremity Assessment Upper Extremity Assessment: Defer to OT evaluation    Lower Extremity Assessment Lower Extremity Assessment: Generalized weakness(Decr bil hip ROM due to body habitus)       Communication   Communication: No difficulties  Cognition Arousal/Alertness:  Awake/alert Behavior During Therapy: WFL for tasks assessed/performed Overall Cognitive Status: Within Functional Limits for tasks assessed                                        General Comments General comments (skin integrity, edema, etc.): O2 sats remained greater than or equal to 89% with amb on Room Air; HR range varried widely 109-133 with activity; Pt asked me about protonix; told her I would pass on her question to Dr. Erlinda Hong    Exercises     Assessment/Plan    PT Assessment Patient needs continued PT services  PT Problem List Decreased strength;Decreased range of motion;Decreased activity tolerance;Decreased balance;Decreased mobility;Cardiopulmonary status limiting activity;Decreased knowledge of use of DME;Obesity       PT Treatment Interventions DME instruction;Gait training;Stair training;Functional mobility training;Therapeutic activities;Therapeutic exercise;Balance training;Patient/family education    PT Goals (Current goals can be found in the Care Plan section)  Acute Rehab PT Goals Patient Stated Goal: to get home PT Goal Formulation: With patient Time For Goal Achievement: 12/20/17 Potential to Achieve Goals: Good    Frequency Min 3X/week   Barriers to discharge Decreased caregiver support;Other (comment) We had a lengthy discussion re: IADLs, safety concerns with shopping; Pt is to look into help from her brother and her neighbors; she is also looking into ordering groceries online, and perhaps meals on wheels    Co-evaluation               AM-PAC PT "6 Clicks" Daily Activity  Outcome Measure Difficulty turning over in bed (including adjusting bedclothes, sheets and blankets)?: None Difficulty moving from lying on back to sitting on the side of the bed? : A Little Difficulty sitting down on and standing up from a chair with arms (e.g., wheelchair, bedside commode, etc,.)?: A Little Help needed moving to and from a bed to chair  (including a wheelchair)?: A Little Help needed walking in hospital room?: A Little Help needed climbing 3-5 steps with a railing? : A Lot 6 Click Score: 18    End of Session Equipment Utilized During Treatment: Other (comment)(rolling vitals monitor) Activity Tolerance: Patient limited by fatigue;Other (comment)(Dyspneic with activity) Patient left: in chair;with call bell/phone within reach Nurse Communication: Mobility status PT Visit Diagnosis: Unsteadiness on feet (R26.81);Other abnormalities of gait and mobility (R26.89);Dizziness and giddiness (R42);Other (comment)(Decr functional capacity)    Time: 1045-1133(Minus approx 7 min for Dr. Stanford Breed to see pt) PT Time Calculation (min) (ACUTE ONLY): 48 min   Charges:   PT Evaluation $PT Eval Moderate Complexity: 1 Mod PT Treatments $Gait Training: 8-22 mins $Therapeutic Activity: 8-22 mins   PT G Codes:        [name], PT  Acute Rehabilitation Services Pager [phone] Office [phone]   [name] 12/06/2017, 12:00 PM

## 2017-12-06 NOTE — Progress Notes (Signed)
Echocardiogram 2D Echocardiogram has been performed.  Matilde Bash 12/06/2017, 10:07 AM

## 2017-12-06 NOTE — Consult Note (Addendum)
Cardiology Consult    Patient ID: MONTOYA WATKIN; [id number]; Dec 07, 1940   Admit date: 12/04/2017 Date of Consult: 12/06/2017  Primary Care Provider: Unk Pinto, MD Primary Cardiologist: Minus Breeding, MD  Primary Electrophysiologist: Dr. Rayann Heman  Patient Profile    Brittney Valentine is a 77 y.o. female with past medical history of paroxysmal atrial fibrillation, Hypothyroidism, cardiomyopathy (of unknown etiology, at 35-40% during 04/2017 with no ischemic evaluation pursued), and HTN who is being seen today for the evaluation of atrial fibrillation with RVR at the request of Dr. Erlinda Hong.   History of Present Illness    Brittney Valentine has been followed closely by the atrial fibrillation clinic and was previously on Flecainide for several years with great control of her arrhythmia but she developed recurrent AF and episode of VT during an admission in 04/2017, therefore Flecainide was discontinued. Has also been on Amiodarone since but this was discontinued in 06/2017 due to thyroid dysfunction.   At the time of her visit in 10/2017, she had stopped anticoagulation in anticipation of an endoscopy and colonoscopy the following week. On 12/03/2017, she reported this had been delayed due to the need for kidney stone/ureteral stent placement on 11/12/2017 and she had yet to resume anticoagulation as she was still planning to have her GI studies in the coming weeks. She was reporting more fatigue and was in agreement for hospitalization for Sotalol initiation once she had been on uninterrupted anticoagulation for 3 weeks. She did have swelling along her left leg, therefore an [place] was ordered to rule-out DVT.  This did show evidence of an acute DVT involving the femoral, popliteal, intramuscular gastrocnemius, and peroneal veins of the left lower extremity. Therefore, she was advised to go to the ED for further management.   Upon arrival to the ED, initial labs showed WBC 9.9, Hgb 15.6, platelets 388, Na+  137, K+ 4.9, and creatinine 1.37. TSH 9.266. BNP 289. CTA showed bilateral pulmonary emboli, right greater than left, with associated changes in the lower lobes bilaterally as well and pleural effusions. No right heart strain noted. EKG showed atrial fibrillation with RVR, HR 121.  She was started on IV Heparin upon admission and has been transitioned back to Eliquis (PE/DVT dosing at 10mg  BID). She was on Toprol-XL 200mg  daily at the time of admission but this was held on 6/29 due to hypotension. Scheduled to be resumed today at 12.5mg  daily. Most recent BP at 111/86. By review of telemetry, HR has been in the 90's to 110's over the past 24 hours.   In talking with the patient, she reports having significant fatigue prior to admission but denies any acute changes in her respiratory status. No chest pain. Has noticed worsening edema along her LLE for the past 3-4 weeks. Reports her fatigue is starting to improve. Overall, feeling much improved this morning.    Past Medical History:  Diagnosis Date  . Acute UTI   . Anemia   . Blood transfusion without reported diagnosis   . Cirrhosis ([place])   . Degenerative disk disease    knees  . Diverticulosis   . Dysrhythmia   . GERD (gastroesophageal reflux disease)   . Hemorrhoids   . History of kidney stones   . Hypertension   . Hypothyroidism   . Obesity   . Paroxysmal atrial fibrillation ([facility])   . Partial small bowel obstruction ([place])   . Personal history of colonic polyps 03/25/2012   8 mm rectal adenoma 03/2012  .  Portal hypertensive gastropathy ([place])   . Shingles   . Thyroid disease   . Varices, esophageal ([place])   . Zoster     Past Surgical History:  Procedure Laterality Date  . ABDOMINAL HYSTERECTOMY    . APPENDECTOMY  1991  . CHOLECYSTECTOMY  2000  . COLON SURGERY    . COLON SURGERY    . COLONOSCOPY    . CYSTOSCOPY     10-26-17 budyzn  . CYSTOSCOPY/URETEROSCOPY/HOLMIUM LASER/STENT PLACEMENT Left 10/26/2017   Procedure:  CYSTOSCOPY, URETEROSCOPY/RETROGRADE/STENT PLACEMENT;  Surgeon: Nickie Retort, MD;  Location: [facility];  Service: Urology;  Laterality: Left;  NEEDS DIGITAL URETEROSCOPE  . CYSTOSCOPY/URETEROSCOPY/HOLMIUM LASER/STENT PLACEMENT Left 11/12/2017   Procedure: CYSTOSCOPY LEFT RETROGRADE PYELOGRAM LEFT URETEROSCOPY HOLMIUM LASER AND LEFT URETERAL STENT EXCHANGE AND LASER ENDO URETEROTOMY;  Surgeon: Ardis Hughs, MD;  Location: [facility];  Service: Urology;  Laterality: Left;  . ESOPHAGOGASTRODUODENOSCOPY  multiple     Home Medications:  Prior to Admission medications   Medication Sig Start Date End Date Taking? Authorizing Provider  Cholecalciferol (VITAMIN D-3) 1000 UNITS CAPS Take 1,000 Units by mouth 2 (two) times daily.    Yes [provider]  furosemide (LASIX) 20 MG tablet Take 20 mg by mouth 2 (two) times daily.   Yes [provider]  levothyroxine (SYNTHROID, LEVOTHROID) 112 MCG tablet TAKE 1 TABLET BY MOUTH IN THE MORNING 09/05/17  Yes Unk Pinto, MD  metoprolol succinate (TOPROL-XL) 100 MG 24 hr tablet Take 2 tablets  daily Patient taking differently: Take 200 mg by mouth daily.  10/29/17  Yes Unk Pinto, MD  ranitidine (ZANTAC) 300 MG tablet Take 1 tablet (300 mg total) at bedtime by mouth. Patient taking differently: Take 300 mg by mouth at bedtime as needed for heartburn.  04/22/17 04/22/18 Yes Vicie Mutters, PA-C    Inpatient Medications: Scheduled Meds: . apixaban  10 mg Oral BID   Followed by  . [START ON 12/12/2017] apixaban  5 mg Oral BID  . cholecalciferol  1,000 Units Oral BID  . famotidine  20 mg Oral QHS  . furosemide  20 mg Oral BID  . lactulose  10 g Oral Daily  . levothyroxine  112 mcg Oral QAC breakfast  . metoprolol succinate  12.5 mg Oral Daily   Continuous Infusions:  PRN Meds: dextromethorphan-guaiFENesin, hydrALAZINE, levalbuterol, ondansetron **OR** ondansetron (ZOFRAN) IV, oxyCODONE, polyethylene glycol,  zolpidem  Allergies:    Allergies  Allergen Reactions  . Amoxicillin Palpitations  . Ciprofloxacin Swelling  . Sulfonamide Derivatives Other (See Comments)    See little dots, skin feels like its burning  . Robaxin [Methocarbamol] Palpitations  . Verapamil Palpitations    Social History:   Social History   Socioeconomic History  . Marital status: Single    Spouse name: Not on file  . Number of children: Not on file  . Years of education: Not on file  . Highest education level: Not on file  Occupational History  . Not on file  Social Needs  . Financial resource strain: Not on file  . Food insecurity:    Worry: Not on file    Inability: Not on file  . Transportation needs:    Medical: Not on file    Non-medical: Not on file  Tobacco Use  . Smoking status: Never Smoker  . Smokeless tobacco: Never Used  Substance and Sexual Activity  . Alcohol use: No  . Drug use: No  . Sexual activity: Not Currently    Partners:  Male  Lifestyle  . Physical activity:    Days per week: Not on file    Minutes per session: Not on file  . Stress: Not on file  Relationships  . Social connections:    Talks on phone: Not on file    Gets together: Not on file    Attends religious service: Not on file    Active member of club or organization: Not on file    Attends meetings of clubs or organizations: Not on file    Relationship status: Not on file  . Intimate partner violence:    Fear of current or ex partner: Not on file    Emotionally abused: Not on file    Physically abused: Not on file    Forced sexual activity: Not on file  Other Topics Concern  . Not on file  Social History Narrative  . Not on file     Family History:    Family History  Problem Relation Age of Onset  . Heart failure Mother        died from  . Hypertension Mother   . Heart attack Father        died from  . Hypertension Sister       Review of Systems    General:  No chills, fever, night sweats or  weight changes. Positive for fatigue.  Cardiovascular:  No chest pain, dyspnea on exertion, orthopnea,  paroxysmal nocturnal dyspnea. Positive for palpitations and edema.  Dermatological: No rash, lesions/masses Respiratory: No cough, dyspnea Urologic: No hematuria, dysuria Abdominal:   No nausea, vomiting, diarrhea, bright red blood per rectum, melena, or hematemesis Neurologic:  No visual changes, wkns, changes in mental status.  All other systems reviewed and are otherwise negative except as noted above.  Physical Exam/Data    Vitals:   12/06/17 0037 12/06/17 0038 12/06/17 0437 12/06/17 0524  BP: 98/77  (!) 86/71 98/66  Pulse: (!) 58  (!) 119 97  Resp: 19  20 19   Temp:  98.8 F (37.1 C) 98.8 F (37.1 C)   TempSrc:  Oral Oral   SpO2: 93%  96% 94%  Weight:      Height:        Intake/Output Summary (Last 24 hours) at 12/06/2017 0749 Last data filed at 12/05/2017 1900 Gross per 24 hour  Intake 737.39 ml  Output -  Net 737.39 ml   Filed Weights   12/04/17 1718 12/05/17 0421  Weight: 213 lb (96.6 kg) 220 lb 3.8 oz (99.9 kg)   Body mass index is 36.65 kg/m.   General: Pleasant, Caucasian female appearing in NAD Psych: Normal affect. Neuro: Alert and oriented X 3. Moves all extremities spontaneously. HEENT: Normal  Neck: Supple without bruits or JVD. Lungs:  Resp regular and unlabored, CTA without wheezing or rales. Heart: Irregularly irregular no s3, s4, or murmurs. Abdomen: Soft, non-tender, non-distended, BS + x 4.  Extremities: No clubbing or cyanosis. 2+ pitting edema along LLE. DP/PT/Radials 2+ and equal bilaterally.   Labs/Studies     Relevant CV Studies:  Echocardiogram: 04/12/2017 Study Conclusions  - Left ventricle: The cavity size was normal. Wall thickness was   increased in a pattern of mild LVH. Systolic function was   vigorous. The estimated ejection fraction was in the range of 65%   to 70%. Wall motion was normal; there were no regional wall    motion abnormalities. Left ventricular diastolic function   parameters were normal. - Mitral valve: Calcified annulus. -  Left atrium: The atrium was mildly dilated. - Right ventricle: The cavity size was mildly dilated. - Right atrium: The atrium was mildly dilated. - Pulmonary arteries: Systolic pressure was moderately increased.   PA peak pressure: 61 mm Hg (S).  Impressions:  - Vigorous LV systolic function; elevated velocity across aortic   valve (2.5 m/s) likely due to vigorous LV function as aortic   valve appears to open well; mild LAE/RAE/RVE; mild TR; moderate   pulmonary hypertension.  Echocardiogram: 04/14/2017 Study Conclusions  - Left ventricle: The cavity size was normal. Wall thickness was   increased in a pattern of mild LVH. Systolic function was   moderately reduced. The estimated ejection fraction was in the   range of 35% to 40%. Diffuse hypokinesis. Indeterminant diastolic   function (atrial fibrillation). - Aortic valve: There was no stenosis. - Mitral valve: Mildly calcified annulus. There was mild   regurgitation. - Left atrium: The atrium was moderately dilated. - Right ventricle: The cavity size was normal. Systolic function   was normal. - Right atrium: The atrium was mildly dilated. - Tricuspid valve: Peak RV-RA gradient (S): 24 mm Hg. - Systemic veins: IVC was not visualized.  Impressions:  - The patient was in atrial fibrillation. Normal LV size with mild   LV hypertrophy. EF 35-40%, diffuse hypokinesis. Normal RV size   and systolic function. Mild mitral regurgitation.  Laboratory Data:  Chemistry Recent Labs  Lab 12/03/17 1720 12/04/17 1743 12/04/17 1801 12/05/17 0258  NA 138 137 138 138  K 4.9 4.9 4.6 4.8  CL 107 108 107 112*  CO2 25 22  --  20*  GLUCOSE 89 101* 96 75  BUN 29* 29* 36* 31*  CREATININE 1.10* 1.37* 1.10* 1.24*  CALCIUM 9.5 9.8  --  9.3  GFRNONAA 49* 37*  --  42*  GFRAA 57* 43*  --  48*  ANIONGAP  --  7  --   6    Recent Labs  Lab 12/03/17 1720 12/04/17 1743  PROT 5.9* 6.3*  ALBUMIN  --  2.6*  AST 78* 99*  ALT 43* 50*  ALKPHOS  --  95  BILITOT 2.5* 2.7*   Hematology Recent Labs  Lab 12/03/17 1720 12/04/17 1743 12/04/17 1801 12/05/17 0258  WBC 10.7 9.9  --  9.8  RBC 4.85 5.15*  --  4.28  HGB 14.8 15.6* 17.7* 13.0  HCT 42.1 48.1* 52.0* 39.6  MCV 86.8 93.4  --  92.5  MCH 30.5 30.3  --  30.4  MCHC 35.2 32.4  --  32.8  RDW 18.0* 23.1*  --  22.6*  PLT 383 388  --  302   Cardiac EnzymesNo results for input(s): TROPONINI in the last 168 hours.  Recent Labs  Lab 12/04/17 1810  TROPIPOC 0.01    BNP Recent Labs  Lab 12/04/17 2142  BNP 289.5*    DDimer No results for input(s): DDIMER in the last 168 hours.  Radiology/Studies:  Ct Angio Chest Pe W/cm &/or Wo Cm  Result Date: 12/04/2017 CLINICAL DATA:  Shortness of breath on exertion, history of deep venous thrombosis. EXAM: CT ANGIOGRAPHY CHEST WITH CONTRAST TECHNIQUE: Multidetector CT imaging of the chest was performed using the standard protocol during bolus administration of intravenous contrast. Multiplanar CT image reconstructions and MIPs were obtained to evaluate the vascular anatomy. CONTRAST:  149mL ISOVUE-370 IOPAMIDOL (ISOVUE-370) INJECTION 76% COMPARISON:  09/09/2017 FINDINGS: Cardiovascular: Thoracic aorta demonstrates atherosclerotic calcifications without aneurysmal dilatation. Opacification is limited precluding evaluation for any  possible dissection. Mild cardiac enlargement is seen. No right heart strain is noted. The pulmonary artery shows a normal branching pattern with filling defects in the left lower and upper lobes as well as significant filling defects within the right lower lobe consistent with pulmonary emboli. Mediastinum/Nodes: The esophagus is within normal limits. No significant hilar or mediastinal adenopathy is noted. Thoracic inlet is within normal limits. Lungs/Pleura: Left lung is well aerated. Minimal  left pleural effusion is noted. Mild left basilar atelectasis is seen. The right lung is also well aerated. A large right-sided pleural effusion is noted which is increased in the interval from the prior CT of the abdomen and pelvis. Right lower lobe consolidation is noted related to the underlying pulmonary embolus. This has increased in the interval from the prior exam. Upper Abdomen: Visualized upper abdomen demonstrates cirrhotic change of the liver is well as ascites. There is at least 1 lesion identified within the left lobe of the liver stable in appearance from the prior exam. This has been previously evaluated utilizing MR the abdomen. No other significant upper abdominal abnormality is seen. Musculoskeletal: Degenerative changes of the thoracic spine are noted. Review of the MIP images confirms the above findings. IMPRESSION: Changes consistent with bilateral pulmonary emboli right greater than left with associated changes in the lower lobes bilaterally as well as bilateral pleural effusions right considerably greater than left. No right heart strain is noted. Changes in the upper abdomen consistent with cirrhosis and ascites. Stable benign hepatic lesion is noted. Aortic Atherosclerosis (ICD10-I70.0). Critical Value/emergent results were called by telephone at the time of interpretation on 12/04/2017 at 8:19 pm to [facility] , who verbally acknowledged these results. Electronically Signed   By: Inez Catalina M.D.   On: 12/04/2017 20:21     Assessment & Plan    1. Atrial Fibrillation with RVR - the patient has a known history of PAF and is currently admitted with an acute PE/DVT in the setting of holding anticoagulation for over 6 weeks due to multiple medical procedures. HR has overall been in the 90's to 110's over the past 24 hours by review of telemetry and Toprol-XL was held yesterday due to hypotension. She did receive Toprol-XL 200mg  on 6/28. - would expect her HR to be elevated in the  setting of her acute PE. BP has improved today, therefore would recommend titration of her Toprol-XL back to at least 25mg  daily (currently ordered as 12.5mg  daily) as she was on 200mg  daily PTA. Plan to titrate to 50mg  daily prior to discharge if BP remains stable. Can continue to titrate as an outpatient pending BP response. No indication for Amiodarone and would recommend to avoid this given her hypothyroidism and elevated LFT's.  - initially started on Heparin but has been transitioned to Eliquis 10mg  BID for 7 days then 5mg  BID.  2. Cardiomyopathy - EF was variable from 65-70% to 35-40% by imaging during her hospitalization in 04/2017. Has not undergone ischemic evaluation as an outpatient. A repeat echo has been ordered to assess LV function and to assess for right heart strain (not noted on CT imaging at time of admission). Remains on PO Lasix 20mg  BID and BB therapy. Not on ACE-I/ARB/ARNI given soft BP.    3. Hypothyroidism - TSH elevated to 9.266. Synthroid dose adjustment per admitting team.   4. Acute PE/DVT - lower extremity dopplers showed evidence of an acute DVT involving the femoral, popliteal, intramuscular gastrocnemius, and peroneal veins of the left lower extremity.  CTA showed bilateral pulmonary emboli, right greater than left. Initially placed on Heparin and has been restarted on Eliquis at 10mg  BID.    For questions or updates, please contact [place] Please consult [email or web address] for contact info under Cardiology/STEMI.  Signed, Erma Heritage, PA-C 12/06/2017, 7:49 AM Pager: [phone]  As above, patient seen and examined.  Briefly she is a 77 year old female with past medical history of persistent atrial fibrillation, cardiomyopathy of unknown etiology, hypothyroidism, cirrhosis with esophageal varices and now status post pulmonary embolus for evaluation of atrial fibrillation.  Previously treated with flecainide but this was discontinued because of an episode  of ventricular tachycardia.  Subsequently treated with amiodarone but this was discontinued secondary to cirrhosis and thyroid dysfunction.  Patient recently off of anticoagulation in anticipation of EGD and colonoscopy.  However this was delayed as she then developed nephrolithiasis and required ureteral stent.  She recently was at home and had a syncopal episode.  No preceding chest pain, palpitations, dyspnea or nausea.  She has very little recall of the event and does not know how long she was unconscious.  She subsequently was found to have left lower extremity edema and ultimately found to have a DVT.  CTA was performed and showed a pulmonary embolus.  She is noted to be in atrial fibrillation with rapid ventricular response and cardiology asked to evaluate.  Note she has been followed in atrial fibrillation clinic and plan at some point was to admit patient after 3 weeks of anticoagulation for initiation of sotalol.  Last echocardiogram November 2018 showed ejection fraction 35 to 40%.  This was new and she has not had further evaluation. Her electrocardiogram shows atrial fibrillation with PVCs or aberrantly conducted beats, incomplete left bundle branch block and nonspecific ST changes. CTA shows bilateral pulmonary emboli, bilateral pleural effusions right greater than left and ascites.  Laboratories with BUN 31 and creatinine 1.24.  BNP 289.  Albumin 2.6.   1 persistent atrial fibrillation-plan previously had been to admit patient following 3 weeks of anticoagulation with initiation of sotalol to help maintain sinus rhythm.  I think this will need to be delayed in light of recent pulmonary embolus as I think maintaining sinus rhythm at this point would be less likely.  This can be pursued in the future.  We will continue Toprol at 25 mg daily for rate control.  This is significantly lower dose than on at home.  Increase as needed to control heart rate and as tolerated by blood pressure.  Note  amiodarone was discontinued previously because of cirrhosis and thyroid dysfunction. CHADSvasc 4.  2 recent pulmonary embolus-following urological procedures.  Agree with apixaban.  3 cardiomyopathy-most recent ejection fraction decreased compared to previous.  Question etiology.  May be tachycardia mediated from atrial fibrillation.  When she recovers from present illness would likely pursue nuclear study for risk stratification and to exclude coronary disease.  Continue beta-blocker.  Would add ARB later if blood pressure allows.  Would need follow-up echo once sinus reestablished.  Continue present dose of Lasix.  Repeat echocardiogram pending.  4 cirrhosis-plan had been for EGD and colonoscopy.  She is not having active GI bleeding.  Follow-up with gastroenterology.  Note she does have right pleural effusion and ascites which may be related to her cirrhosis.  Would likely benefit from Spironolactone.  Will leave to primary care.  5 Chronic stage III kidney disease-follow renal function while in the hospital.  6 Syncope-recent syncopal episode of uncertain etiology; very  little recall of events; ? Related to pulmonary embolus; await echo; should not drive for 6 months.  Kirk Ruths, MD

## 2017-12-06 NOTE — Discharge Summary (Signed)
Discharge Summary  Brittney Valentine NGE:[id number] DOB: [birth year]  PCP: Unk Pinto, MD  Admit date: 12/04/2017 Discharge date: 12/06/2017  Time spent: 33mins, more than 50% time spent on coordination of care. Case discussed with cardiology.  Recommendations for Outpatient Follow-up:  1. F/u with PMD within a week  for hospital discharge follow up, repeat cbc/bmp at follow up 2. F/u with cardiology and EP on 7/2 3. F/u with GI in August  4. Home health arranged  Now meds at discharge: apixaban, spironolactone Synthroid dose increased Betablocker dose decreased   Discharge Diagnoses:  Active Hospital Problems   Diagnosis Date Noted  . PE (pulmonary thromboembolism) ([facility]) 12/04/2017  . GERD (gastroesophageal reflux disease) 12/04/2017  . DVT (deep venous thrombosis) ([facility]) 12/04/2017  . Pleural effusion 12/04/2017  . Ascites 09/29/2017  . Chronic combined systolic and diastolic CHF (congestive heart failure) ([facility])   . Essential hypertension 12/21/2014  . Hypothyroidism 12/21/2014  . Atrial fibrillation with RVR ([facility]) 05/11/2014  . Hepatic cirrhosis ([facility]) 12/31/2007    Resolved Hospital Problems  No resolved problems to display.    Discharge Condition: stable  Diet recommendation: heart healthy  Filed Weights   12/04/17 1718 12/05/17 0421  Weight: 96.6 kg (213 lb) 99.9 kg (220 lb 3.8 oz)    History of present illness:  PCP: Unk Pinto, MD   Patient coming from:  The patient is coming from home.  At baseline, pt is independent for most of ADL.   Chief Complaint: SOB and left leg swelling  HPI: Brittney Valentine is a 77 y.o. female with medical history significant of liver cirrhosis, ascites, esophageal varicose, portal hypertension, diverticulosis, obesity, PAF not on anticoagulants, CHF with EF of 35%, kidney stone (s/p of CYSTOSCOPY/URETEROSCOPY/HOLMIUM LASER/STENT PLACEMENT), who presents with shortness of breath, left leg swelling.  Patient states  that she underwent ureteral stent placement precedure due to kidney stone on 11/12/2017.  After that, she developed mild shortness of breath.  She has mild dry cough, but no chest pain. Few days ago she noted left leg swelling, which has been progressively getting worse.  Patient has nausea, but no vomiting, diarrhea or abdominal pain.  No symptoms of UTI or unilateral weakness.  Patient was seen by PCP and had doppler done in office, which showed a large left lower extremity DVT. She was to ED for further evaluation and treatment. She previously was on Eliquis for A fib for about 1 month, but she was taken off of this med for ureteral stentingprocedure, has not restarted yet.  ED Course: pt was found to have WBC 9.9, negative troponin, renal function close to baseline, temperature normal, tachycardia with heart rate up to 120s, no tachypnea, oxygen saturation 100% on room air. CT angiogram of chest showed bilateral PE without evidence of right heart strain, and also bilateral pleural effusions, right considerably greater than left. Patient is placed on stepdown for observation.    Hospital Course:  Principal Problem:   PE (pulmonary thromboembolism) ([facility]) Active Problems:   Hepatic cirrhosis (H[facility])   Atrial fibrillation with RVR (HCC)   Essential hypertension   Hypothyroidism   Chronic combined systolic and diastolic CHF (congestive heart failure) (H[facility])   Ascites   GERD (gastroesophageal reflux disease)   DVT (deep venous thrombosis) (H[facility])   Pleural effusion   Bilateral PE/left lower extremity DVT -she was on elliquis for afib/stroke prevention, she was taken off elliquis due to planned kidney stone removal on 6/6, she has been off eliquis in anticipating  colonoscopy/EGD in the near further. -she reports syncope episode x1  at home after the procedure on 6/6 which she though it might be related to anesthesia -she was sent to ED on 6/19 by her pmd due to generalized weakness and possible  uti, - she returned to cardiology office on 6/27 with left leg edema, she is found to have DVT and sent to the hospital  -she reports is up to date with mamogram, GI Dr Carlean Purl is planning to get colonoscopy and EGD while she is off anticoagulation  -she denies family history of clotting disorder, she does not smoke --echo "Normal LV function; mild LVH; mild MR; moderate biatrial  enlargement; mild TR; mild pulmonary hypertension." no right heart strain. -she is started on heparin drip, she is transitioned to eliquis. She feels better,she desires to go home.  Afib, h/o ventricular tachycardia ( was on amiodarone /flecainide) -she presented with afib/rvr, now rate improved, but Remain in afib -Per cardiology office note,  plan to do sotalol after 3weeks of anticoagulation -Due to low bp, betablocker dose  Decreased , she is restarted on eliquis. -cardiology consulted, input appreciated, she is to follow up with cardiology closely.  Chronic combined diastolic and systolic chf Last ef from 04/2017 was 35-40% Repeat echo lvef has improved.   Bilateral pleural effusions  right > left, + ascites  Likely from combination of chf and cirrhosis Continue lasix, start spironolactone   NASH/cirrhosis/h/o esophageal varices/ascites Mild chronically elevated lft/ammonia, No confusion/plt wnl Stable benign hepatic lesion  Denies h/o gi bleed She is started on low dose lactulose daily and spironolactone She is followed closely by GI Dr Carlean Purl  CKDII/III Renal dosing meds, monitor renal function   HTN:  Her blood pressure is low normal,  Betablocker reduced from 200mg  daily to 25mg  daily Patient reports her blood pressure has been running low at home She is advised to continue check her blood pressure at home She is to continue lasix, she is started on spironolactone this hospitalization    Hypothyroidism: tsh 9.26 Increase  Synthroid from 143mcg daily to 139mcg daily  FTT/  generalized weakness  PT eval recommended maximize home health, case manager consulted  Code Status: partial  Family Communication: patient   Disposition Plan: home with home health   Consultants:  cardiology  Procedures:  none  Antibiotics:  none    Discharge Exam: BP 107/80   Pulse 96   Temp 97.6 F (36.4 C) (Oral)   Resp 20   Ht 5\' 5"  (1.651 m)   Wt 99.9 kg (220 lb 3.8 oz)   SpO2 99%   BMI 36.65 kg/m     General:  Frail, chronically ill but NAD, aaox3, she is hard of hearing  Cardiovascular: IRRR  Respiratory: CTABL  Abdomen: Soft/ND/NT, positive BS  Musculoskeletal: left lower extremity Edema  Neuro: alert, oriented    Discharge Instructions You were cared for by a hospitalist during your hospital stay. If you have any questions about your discharge medications or the care you received while you were in the hospital after you are discharged, you can call the unit and asked to speak with the hospitalist on call if the hospitalist that took care of you is not available. Once you are discharged, your primary care physician will handle any further medical issues. Please note that NO REFILLS for any discharge medications will be authorized once you are discharged, as it is imperative that you return to your primary care physician (or establish a relationship  with a primary care physician if you do not have one) for your aftercare needs so that they can reassess your need for medications and monitor your lab values.  Discharge Instructions    Diet - low sodium heart healthy   Complete by:  As directed    Increase activity slowly   Complete by:  As directed      Allergies as of 12/06/2017      Reactions   Amoxicillin Palpitations   Ciprofloxacin Swelling   Sulfonamide Derivatives Other (See Comments)   See little dots, skin feels like its burning   Robaxin [methocarbamol] Palpitations   Verapamil Palpitations      Medication List    TAKE  these medications   apixaban 5 MG Tabs tablet Commonly known as:  ELIQUIS Take 2 tablets (10 mg total) by mouth 2 (two) times daily for 6 days.   apixaban 5 MG Tabs tablet Commonly known as:  ELIQUIS Take 1 tablet (5 mg total) by mouth 2 (two) times daily. Start taking on:  12/12/2017   furosemide 20 MG tablet Commonly known as:  LASIX Take 20 mg by mouth 2 (two) times daily.   lactulose 10 GM/15ML solution Commonly known as:  CHRONULAC Take 15 mLs (10 g total) by mouth daily. Start taking on:  12/07/2017   levothyroxine 150 MCG tablet Commonly known as:  SYNTHROID Take 1 tablet (150 mcg total) by mouth daily. What changed:    medication strength  how much to take  when to take this   metoprolol succinate 25 MG 24 hr tablet Commonly known as:  TOPROL-XL Take 1 tablet (25 mg total) by mouth daily. Start taking on:  12/07/2017 What changed:    medication strength  how much to take  how to take this  when to take this  additional instructions   ranitidine 300 MG tablet Commonly known as:  ZANTAC Take 1 tablet (300 mg total) at bedtime by mouth. What changed:    when to take this  reasons to take this   spironolactone 100 MG tablet Commonly known as:  ALDACTONE Take 1 tablet (100 mg total) by mouth daily.   Vitamin D-3 1000 units Caps Take 1,000 Units by mouth 2 (two) times daily.      Allergies  Allergen Reactions  . Amoxicillin Palpitations  . Ciprofloxacin Swelling  . Sulfonamide Derivatives Other (See Comments)    See little dots, skin feels like its burning  . Robaxin [Methocarbamol] Palpitations  . Verapamil Palpitations   Follow-up Information    Unk Pinto, MD Follow up today.   Specialty:  Internal Medicine Why:  hospital discharge follow up. repeat cbc/bmp at hospital follow up. Contact information: [address] [place] [address] [phone]        [facility] Follow up.   Specialty:   [place] Why:  HHRN/PT arranged- they will contact you for home visits Contact information: [address] [place] [address] [phone]            The results of significant diagnostics from this hospitalization (including imaging, microbiology, ancillary and laboratory) are listed below for reference.    Significant Diagnostic Studies: Ct Angio Chest Pe W/cm &/or Wo Cm  Result Date: 12/04/2017 CLINICAL DATA:  Shortness of breath on exertion, history of deep venous thrombosis. EXAM: CT ANGIOGRAPHY CHEST WITH CONTRAST TECHNIQUE: Multidetector CT imaging of the chest was performed using the standard protocol during bolus administration of intravenous contrast.  Multiplanar CT image reconstructions and MIPs were obtained to evaluate the vascular anatomy. CONTRAST:  189mL ISOVUE-370 IOPAMIDOL (ISOVUE-370) INJECTION 76% COMPARISON:  09/09/2017 FINDINGS: Cardiovascular: Thoracic aorta demonstrates atherosclerotic calcifications without aneurysmal dilatation. Opacification is limited precluding evaluation for any possible dissection. Mild cardiac enlargement is seen. No right heart strain is noted. The pulmonary artery shows a normal branching pattern with filling defects in the left lower and upper lobes as well as significant filling defects within the right lower lobe consistent with pulmonary emboli. Mediastinum/Nodes: The esophagus is within normal limits. No significant hilar or mediastinal adenopathy is noted. Thoracic inlet is within normal limits. Lungs/Pleura: Left lung is well aerated. Minimal left pleural effusion is noted. Mild left basilar atelectasis is seen. The right lung is also well aerated. A large right-sided pleural effusion is noted which is increased in the interval from the prior CT of the abdomen and pelvis. Right lower lobe consolidation is noted related to the underlying pulmonary embolus. This has increased in the interval from the prior exam. Upper  Abdomen: Visualized upper abdomen demonstrates cirrhotic change of the liver is well as ascites. There is at least 1 lesion identified within the left lobe of the liver stable in appearance from the prior exam. This has been previously evaluated utilizing MR the abdomen. No other significant upper abdominal abnormality is seen. Musculoskeletal: Degenerative changes of the thoracic spine are noted. Review of the MIP images confirms the above findings. IMPRESSION: Changes consistent with bilateral pulmonary emboli right greater than left with associated changes in the lower lobes bilaterally as well as bilateral pleural effusions right considerably greater than left. No right heart strain is noted. Changes in the upper abdomen consistent with cirrhosis and ascites. Stable benign hepatic lesion is noted. Aortic Atherosclerosis (ICD10-I70.0). Critical Value/emergent results were called by telephone at the time of interpretation on 12/04/2017 at 8:19 pm to [facility], PA , who verbally acknowledged these results. Electronically Signed   By: Inez Catalina M.D.   On: 12/04/2017 20:21   Dg C-arm 1-60 Min-no Report  Result Date: 11/12/2017 Fluoroscopy was utilized by the requesting physician.  No radiographic interpretation.    Microbiology: Recent Results (from the past 240 hour(s))  Urine Culture     Status: None   Collection Time: 12/04/17  8:37 AM  Result Value Ref Range Status   MICRO NUMBER: [id number]  Final   SPECIMEN QUALITY: ADEQUATE  Final   Sample Source URINE  Final   STATUS: FINAL  Final   Result:   Final    Multiple organisms present, each less than 10,000 CFU/mL. These organisms, commonly found on external and internal genitalia, are considered to be colonizers. No further testing performed.  MRSA PCR Screening     Status: None   Collection Time: 12/05/17  4:27 AM  Result Value Ref Range Status   MRSA by PCR NEGATIVE NEGATIVE Final    Comment:        The GeneXpert MRSA Assay  (FDA approved for NASAL specimens only), is one component of a comprehensive MRSA colonization surveillance program. It is not intended to diagnose MRSA infection nor to guide or monitor treatment for MRSA infections. Performed at [facility], [place] [address] [place], [place] [address]      Labs: Basic Metabolic Panel: Recent Labs  Lab 12/03/17 1720 12/04/17 1743 12/04/17 1801 12/05/17 0258  NA 138 137 138 138  K 4.9 4.9 4.6 4.8  CL 107 108 107 112*  CO2 25 22  --  20*  GLUCOSE 89 101* 96 75  BUN 29* 29* 36* 31*  CREATININE 1.10* 1.37* 1.10* 1.24*  CALCIUM 9.5 9.8  --  9.3   Liver Function Tests: Recent Labs  Lab 12/03/17 1720 12/04/17 1743  AST 78* 99*  ALT 43* 50*  ALKPHOS  --  95  BILITOT 2.5* 2.7*  PROT 5.9* 6.3*  ALBUMIN  --  2.6*   No results for input(s): LIPASE, AMYLASE in the last 168 hours. Recent Labs  Lab 12/04/17 2142  AMMONIA 41*   CBC: Recent Labs  Lab 12/03/17 1720 12/04/17 1743 12/04/17 1801 12/05/17 0258  WBC 10.7 9.9  --  9.8  NEUTROABS 7,362 7.4  --   --   HGB 14.8 15.6* 17.7* 13.0  HCT 42.1 48.1* 52.0* 39.6  MCV 86.8 93.4  --  92.5  PLT 383 388  --  302   Cardiac Enzymes: No results for input(s): CKTOTAL, CKMB, CKMBINDEX, TROPONINI in the last 168 hours. BNP: BNP (last 3 results) Recent Labs    04/14/17 1125 12/04/17 2142  BNP 603.4* 289.5*    ProBNP (last 3 results) No results for input(s): PROBNP in the last 8760 hours.  CBG: No results for input(s): GLUCAP in the last 168 hours.     Signed:  Florencia Reasons MD, PhD  [facility] 12/06/2017, 2:07 PM

## 2017-12-06 NOTE — Progress Notes (Signed)
PT Cancellation Note  Patient Details Name: Brittney Valentine MRN: [id number] DOB: [birth year]   Cancelled Treatment:    Reason Eval/Treat Not Completed: Patient at procedure or test/unavailable   [name], PT  Acute Rehabilitation Services Pager [phone] Office [phone]    [name] 12/06/2017, 10:23 AM

## 2017-12-08 ENCOUNTER — Ambulatory Visit (HOSPITAL_COMMUNITY): Payer: PPO

## 2017-12-08 ENCOUNTER — Ambulatory Visit (HOSPITAL_COMMUNITY): Payer: PPO | Admitting: Nurse Practitioner

## 2017-12-08 DIAGNOSIS — R188 Other ascites: Secondary | ICD-10-CM | POA: Diagnosis not present

## 2017-12-08 DIAGNOSIS — D649 Anemia, unspecified: Secondary | ICD-10-CM | POA: Diagnosis not present

## 2017-12-08 DIAGNOSIS — G4733 Obstructive sleep apnea (adult) (pediatric): Secondary | ICD-10-CM | POA: Diagnosis not present

## 2017-12-08 DIAGNOSIS — M5135 Other intervertebral disc degeneration, thoracolumbar region: Secondary | ICD-10-CM | POA: Diagnosis not present

## 2017-12-08 DIAGNOSIS — Z960 Presence of urogenital implants: Secondary | ICD-10-CM | POA: Diagnosis not present

## 2017-12-08 DIAGNOSIS — I5042 Chronic combined systolic (congestive) and diastolic (congestive) heart failure: Secondary | ICD-10-CM | POA: Diagnosis not present

## 2017-12-08 DIAGNOSIS — J45909 Unspecified asthma, uncomplicated: Secondary | ICD-10-CM | POA: Diagnosis not present

## 2017-12-08 DIAGNOSIS — J9611 Chronic respiratory failure with hypoxia: Secondary | ICD-10-CM | POA: Diagnosis not present

## 2017-12-08 DIAGNOSIS — R7303 Prediabetes: Secondary | ICD-10-CM | POA: Diagnosis not present

## 2017-12-08 DIAGNOSIS — I2699 Other pulmonary embolism without acute cor pulmonale: Secondary | ICD-10-CM | POA: Diagnosis not present

## 2017-12-08 DIAGNOSIS — I420 Dilated cardiomyopathy: Secondary | ICD-10-CM | POA: Diagnosis not present

## 2017-12-08 DIAGNOSIS — I82402 Acute embolism and thrombosis of unspecified deep veins of left lower extremity: Secondary | ICD-10-CM | POA: Diagnosis not present

## 2017-12-08 DIAGNOSIS — K7581 Nonalcoholic steatohepatitis (NASH): Secondary | ICD-10-CM | POA: Diagnosis not present

## 2017-12-08 DIAGNOSIS — N183 Chronic kidney disease, stage 3 (moderate): Secondary | ICD-10-CM | POA: Diagnosis not present

## 2017-12-08 DIAGNOSIS — Z87440 Personal history of urinary (tract) infections: Secondary | ICD-10-CM | POA: Diagnosis not present

## 2017-12-08 DIAGNOSIS — J90 Pleural effusion, not elsewhere classified: Secondary | ICD-10-CM | POA: Diagnosis not present

## 2017-12-08 DIAGNOSIS — E785 Hyperlipidemia, unspecified: Secondary | ICD-10-CM | POA: Diagnosis not present

## 2017-12-08 DIAGNOSIS — I130 Hypertensive heart and chronic kidney disease with heart failure and stage 1 through stage 4 chronic kidney disease, or unspecified chronic kidney disease: Secondary | ICD-10-CM | POA: Diagnosis not present

## 2017-12-08 DIAGNOSIS — I8510 Secondary esophageal varices without bleeding: Secondary | ICD-10-CM | POA: Diagnosis not present

## 2017-12-08 DIAGNOSIS — I480 Paroxysmal atrial fibrillation: Secondary | ICD-10-CM | POA: Diagnosis not present

## 2017-12-08 DIAGNOSIS — K766 Portal hypertension: Secondary | ICD-10-CM | POA: Diagnosis not present

## 2017-12-08 DIAGNOSIS — K219 Gastro-esophageal reflux disease without esophagitis: Secondary | ICD-10-CM | POA: Diagnosis not present

## 2017-12-08 DIAGNOSIS — Z8701 Personal history of pneumonia (recurrent): Secondary | ICD-10-CM | POA: Diagnosis not present

## 2017-12-08 DIAGNOSIS — E039 Hypothyroidism, unspecified: Secondary | ICD-10-CM | POA: Diagnosis not present

## 2017-12-08 DIAGNOSIS — K7460 Unspecified cirrhosis of liver: Secondary | ICD-10-CM | POA: Diagnosis not present

## 2017-12-13 ENCOUNTER — Inpatient Hospital Stay (HOSPITAL_COMMUNITY): Payer: PPO

## 2017-12-13 ENCOUNTER — Other Ambulatory Visit: Payer: Self-pay

## 2017-12-13 ENCOUNTER — Inpatient Hospital Stay (HOSPITAL_COMMUNITY)
Admission: EM | Admit: 2017-12-13 | Discharge: 2017-12-24 | DRG: 871 | Disposition: A | Discharge status: Skilled Nursing Facility | Payer: PPO | Attending: Family Medicine | Admitting: Family Medicine

## 2017-12-13 ENCOUNTER — Emergency Department (HOSPITAL_COMMUNITY): Payer: PPO

## 2017-12-13 ENCOUNTER — Encounter (HOSPITAL_COMMUNITY): Payer: Self-pay

## 2017-12-13 DIAGNOSIS — D62 Acute posthemorrhagic anemia: Secondary | ICD-10-CM | POA: Diagnosis present

## 2017-12-13 DIAGNOSIS — E538 Deficiency of other specified B group vitamins: Secondary | ICD-10-CM | POA: Diagnosis present

## 2017-12-13 DIAGNOSIS — M6389 Disorders of muscle in diseases classified elsewhere, multiple sites: Secondary | ICD-10-CM | POA: Diagnosis not present

## 2017-12-13 DIAGNOSIS — A0472 Enterocolitis due to Clostridium difficile, not specified as recurrent: Secondary | ICD-10-CM | POA: Diagnosis present

## 2017-12-13 DIAGNOSIS — Z9049 Acquired absence of other specified parts of digestive tract: Secondary | ICD-10-CM

## 2017-12-13 DIAGNOSIS — Z888 Allergy status to other drugs, medicaments and biological substances status: Secondary | ICD-10-CM | POA: Diagnosis not present

## 2017-12-13 DIAGNOSIS — Z882 Allergy status to sulfonamides status: Secondary | ICD-10-CM

## 2017-12-13 DIAGNOSIS — D509 Iron deficiency anemia, unspecified: Secondary | ICD-10-CM | POA: Diagnosis present

## 2017-12-13 DIAGNOSIS — I5043 Acute on chronic combined systolic (congestive) and diastolic (congestive) heart failure: Secondary | ICD-10-CM | POA: Diagnosis not present

## 2017-12-13 DIAGNOSIS — B954 Other streptococcus as the cause of diseases classified elsewhere: Secondary | ICD-10-CM | POA: Diagnosis present

## 2017-12-13 DIAGNOSIS — R8271 Bacteriuria: Secondary | ICD-10-CM | POA: Diagnosis not present

## 2017-12-13 DIAGNOSIS — E875 Hyperkalemia: Secondary | ICD-10-CM | POA: Diagnosis not present

## 2017-12-13 DIAGNOSIS — I447 Left bundle-branch block, unspecified: Secondary | ICD-10-CM | POA: Diagnosis present

## 2017-12-13 DIAGNOSIS — Z90710 Acquired absence of both cervix and uterus: Secondary | ICD-10-CM

## 2017-12-13 DIAGNOSIS — K3189 Other diseases of stomach and duodenum: Secondary | ICD-10-CM | POA: Diagnosis not present

## 2017-12-13 DIAGNOSIS — S40022A Contusion of left upper arm, initial encounter: Secondary | ICD-10-CM | POA: Diagnosis not present

## 2017-12-13 DIAGNOSIS — I951 Orthostatic hypotension: Secondary | ICD-10-CM | POA: Diagnosis not present

## 2017-12-13 DIAGNOSIS — K7581 Nonalcoholic steatohepatitis (NASH): Secondary | ICD-10-CM | POA: Diagnosis present

## 2017-12-13 DIAGNOSIS — I482 Chronic atrial fibrillation: Secondary | ICD-10-CM | POA: Diagnosis not present

## 2017-12-13 DIAGNOSIS — E039 Hypothyroidism, unspecified: Secondary | ICD-10-CM | POA: Diagnosis present

## 2017-12-13 DIAGNOSIS — I499 Cardiac arrhythmia, unspecified: Secondary | ICD-10-CM | POA: Diagnosis not present

## 2017-12-13 DIAGNOSIS — Z7901 Long term (current) use of anticoagulants: Secondary | ICD-10-CM

## 2017-12-13 DIAGNOSIS — Z8249 Family history of ischemic heart disease and other diseases of the circulatory system: Secondary | ICD-10-CM

## 2017-12-13 DIAGNOSIS — I480 Paroxysmal atrial fibrillation: Secondary | ICD-10-CM | POA: Diagnosis present

## 2017-12-13 DIAGNOSIS — A419 Sepsis, unspecified organism: Secondary | ICD-10-CM

## 2017-12-13 DIAGNOSIS — E861 Hypovolemia: Secondary | ICD-10-CM | POA: Diagnosis not present

## 2017-12-13 DIAGNOSIS — K7460 Unspecified cirrhosis of liver: Secondary | ICD-10-CM | POA: Diagnosis not present

## 2017-12-13 DIAGNOSIS — N3000 Acute cystitis without hematuria: Secondary | ICD-10-CM | POA: Diagnosis not present

## 2017-12-13 DIAGNOSIS — Z881 Allergy status to other antibiotic agents status: Secondary | ICD-10-CM | POA: Diagnosis not present

## 2017-12-13 DIAGNOSIS — D689 Coagulation defect, unspecified: Secondary | ICD-10-CM | POA: Diagnosis present

## 2017-12-13 DIAGNOSIS — R7881 Bacteremia: Secondary | ICD-10-CM | POA: Diagnosis not present

## 2017-12-13 DIAGNOSIS — J9811 Atelectasis: Secondary | ICD-10-CM | POA: Diagnosis not present

## 2017-12-13 DIAGNOSIS — K766 Portal hypertension: Secondary | ICD-10-CM | POA: Diagnosis present

## 2017-12-13 DIAGNOSIS — E8779 Other fluid overload: Secondary | ICD-10-CM | POA: Diagnosis not present

## 2017-12-13 DIAGNOSIS — N183 Chronic kidney disease, stage 3 (moderate): Secondary | ICD-10-CM | POA: Diagnosis not present

## 2017-12-13 DIAGNOSIS — D128 Benign neoplasm of rectum: Secondary | ICD-10-CM | POA: Diagnosis present

## 2017-12-13 DIAGNOSIS — Z6841 Body Mass Index (BMI) 40.0 and over, adult: Secondary | ICD-10-CM

## 2017-12-13 DIAGNOSIS — K922 Gastrointestinal hemorrhage, unspecified: Secondary | ICD-10-CM | POA: Diagnosis not present

## 2017-12-13 DIAGNOSIS — I4891 Unspecified atrial fibrillation: Secondary | ICD-10-CM | POA: Diagnosis not present

## 2017-12-13 DIAGNOSIS — I472 Ventricular tachycardia: Secondary | ICD-10-CM | POA: Diagnosis present

## 2017-12-13 DIAGNOSIS — R279 Unspecified lack of coordination: Secondary | ICD-10-CM | POA: Diagnosis not present

## 2017-12-13 DIAGNOSIS — I420 Dilated cardiomyopathy: Secondary | ICD-10-CM | POA: Diagnosis present

## 2017-12-13 DIAGNOSIS — N390 Urinary tract infection, site not specified: Secondary | ICD-10-CM | POA: Diagnosis not present

## 2017-12-13 DIAGNOSIS — R188 Other ascites: Secondary | ICD-10-CM | POA: Diagnosis present

## 2017-12-13 DIAGNOSIS — A4159 Other Gram-negative sepsis: Principal | ICD-10-CM | POA: Diagnosis present

## 2017-12-13 DIAGNOSIS — Y92230 Patient room in hospital as the place of occurrence of the external cause: Secondary | ICD-10-CM | POA: Diagnosis not present

## 2017-12-13 DIAGNOSIS — Z79899 Other long term (current) drug therapy: Secondary | ICD-10-CM

## 2017-12-13 DIAGNOSIS — I8510 Secondary esophageal varices without bleeding: Secondary | ICD-10-CM | POA: Diagnosis not present

## 2017-12-13 DIAGNOSIS — Z743 Need for continuous supervision: Secondary | ICD-10-CM | POA: Diagnosis not present

## 2017-12-13 DIAGNOSIS — R278 Other lack of coordination: Secondary | ICD-10-CM | POA: Diagnosis not present

## 2017-12-13 DIAGNOSIS — R600 Localized edema: Secondary | ICD-10-CM | POA: Diagnosis not present

## 2017-12-13 DIAGNOSIS — Z86711 Personal history of pulmonary embolism: Secondary | ICD-10-CM | POA: Diagnosis not present

## 2017-12-13 DIAGNOSIS — N3080 Other cystitis without hematuria: Secondary | ICD-10-CM | POA: Diagnosis present

## 2017-12-13 DIAGNOSIS — K7469 Other cirrhosis of liver: Secondary | ICD-10-CM | POA: Diagnosis present

## 2017-12-13 DIAGNOSIS — I481 Persistent atrial fibrillation: Secondary | ICD-10-CM | POA: Diagnosis not present

## 2017-12-13 DIAGNOSIS — R195 Other fecal abnormalities: Secondary | ICD-10-CM | POA: Diagnosis not present

## 2017-12-13 DIAGNOSIS — D649 Anemia, unspecified: Secondary | ICD-10-CM | POA: Diagnosis not present

## 2017-12-13 DIAGNOSIS — R161 Splenomegaly, not elsewhere classified: Secondary | ICD-10-CM | POA: Diagnosis present

## 2017-12-13 DIAGNOSIS — I8500 Esophageal varices without bleeding: Secondary | ICD-10-CM | POA: Diagnosis not present

## 2017-12-13 DIAGNOSIS — I130 Hypertensive heart and chronic kidney disease with heart failure and stage 1 through stage 4 chronic kidney disease, or unspecified chronic kidney disease: Secondary | ICD-10-CM | POA: Diagnosis present

## 2017-12-13 DIAGNOSIS — B9689 Other specified bacterial agents as the cause of diseases classified elsewhere: Secondary | ICD-10-CM | POA: Diagnosis not present

## 2017-12-13 DIAGNOSIS — I9589 Other hypotension: Secondary | ICD-10-CM | POA: Diagnosis not present

## 2017-12-13 DIAGNOSIS — I2699 Other pulmonary embolism without acute cor pulmonale: Secondary | ICD-10-CM | POA: Diagnosis present

## 2017-12-13 DIAGNOSIS — I5042 Chronic combined systolic (congestive) and diastolic (congestive) heart failure: Secondary | ICD-10-CM | POA: Diagnosis present

## 2017-12-13 DIAGNOSIS — R110 Nausea: Secondary | ICD-10-CM | POA: Diagnosis not present

## 2017-12-13 DIAGNOSIS — B955 Unspecified streptococcus as the cause of diseases classified elsewhere: Secondary | ICD-10-CM | POA: Diagnosis not present

## 2017-12-13 DIAGNOSIS — K649 Unspecified hemorrhoids: Secondary | ICD-10-CM | POA: Diagnosis present

## 2017-12-13 DIAGNOSIS — J90 Pleural effusion, not elsewhere classified: Secondary | ICD-10-CM | POA: Diagnosis not present

## 2017-12-13 DIAGNOSIS — A09 Infectious gastroenteritis and colitis, unspecified: Secondary | ICD-10-CM | POA: Diagnosis not present

## 2017-12-13 DIAGNOSIS — R2681 Unsteadiness on feet: Secondary | ICD-10-CM | POA: Diagnosis not present

## 2017-12-13 DIAGNOSIS — Z87442 Personal history of urinary calculi: Secondary | ICD-10-CM

## 2017-12-13 DIAGNOSIS — Z960 Presence of urogenital implants: Secondary | ICD-10-CM | POA: Diagnosis not present

## 2017-12-13 DIAGNOSIS — I959 Hypotension, unspecified: Secondary | ICD-10-CM | POA: Diagnosis not present

## 2017-12-13 DIAGNOSIS — G4733 Obstructive sleep apnea (adult) (pediatric): Secondary | ICD-10-CM | POA: Diagnosis present

## 2017-12-13 DIAGNOSIS — K219 Gastro-esophageal reflux disease without esophagitis: Secondary | ICD-10-CM | POA: Diagnosis present

## 2017-12-13 DIAGNOSIS — I82409 Acute embolism and thrombosis of unspecified deep veins of unspecified lower extremity: Secondary | ICD-10-CM | POA: Diagnosis not present

## 2017-12-13 DIAGNOSIS — Z86010 Personal history of colonic polyps: Secondary | ICD-10-CM

## 2017-12-13 DIAGNOSIS — N179 Acute kidney failure, unspecified: Secondary | ICD-10-CM | POA: Diagnosis not present

## 2017-12-13 DIAGNOSIS — A408 Other streptococcal sepsis: Secondary | ICD-10-CM | POA: Diagnosis not present

## 2017-12-13 DIAGNOSIS — R7303 Prediabetes: Secondary | ICD-10-CM | POA: Diagnosis present

## 2017-12-13 DIAGNOSIS — E559 Vitamin D deficiency, unspecified: Secondary | ICD-10-CM | POA: Diagnosis present

## 2017-12-13 DIAGNOSIS — M6281 Muscle weakness (generalized): Secondary | ICD-10-CM | POA: Diagnosis not present

## 2017-12-13 DIAGNOSIS — Z86718 Personal history of other venous thrombosis and embolism: Secondary | ICD-10-CM

## 2017-12-13 DIAGNOSIS — R41841 Cognitive communication deficit: Secondary | ICD-10-CM | POA: Diagnosis not present

## 2017-12-13 DIAGNOSIS — R1111 Vomiting without nausea: Secondary | ICD-10-CM | POA: Diagnosis not present

## 2017-12-13 DIAGNOSIS — R51 Headache: Secondary | ICD-10-CM | POA: Diagnosis not present

## 2017-12-13 DIAGNOSIS — I272 Pulmonary hypertension, unspecified: Secondary | ICD-10-CM | POA: Diagnosis present

## 2017-12-13 DIAGNOSIS — B372 Candidiasis of skin and nail: Secondary | ICD-10-CM | POA: Diagnosis not present

## 2017-12-13 DIAGNOSIS — Z79890 Hormone replacement therapy (postmenopausal): Secondary | ICD-10-CM

## 2017-12-13 HISTORY — DX: Urinary tract infection, site not specified: N39.0

## 2017-12-13 LAB — URINALYSIS, ROUTINE W REFLEX MICROSCOPIC
Bilirubin Urine: NEGATIVE
Glucose, UA: NEGATIVE mg/dL
Ketones, ur: NEGATIVE mg/dL
Nitrite: NEGATIVE
Protein, ur: 30 mg/dL — AB
Renal Epithelial: 8
Specific Gravity, Urine: 1.017 (ref 1.005–1.030)
WBC, UA: >50 WBC/hpf — ABNORMAL HIGH (ref 0–5)
pH: 5 (ref 5.0–8.0)

## 2017-12-13 LAB — I-STAT CG4 LACTIC ACID, ED
Lactic Acid, Venous: 3.26 mmol/L (ref 0.5–1.9)
Lactic Acid, Venous: 2.84 mmol/L (ref 0.5–1.9)

## 2017-12-13 LAB — CBC
HCT: 35.8 % — ABNORMAL LOW (ref 36.0–46.0)
HEMOGLOBIN: 11.8 g/dL — AB (ref 12.0–15.0)
MCH: 31 pg (ref 26.0–34.0)
MCHC: 33 g/dL (ref 30.0–36.0)
MCV: 94 fL (ref 78.0–100.0)
PLATELETS: 468 10*3/uL — AB (ref 150–400)
RBC: 3.81 MIL/uL — AB (ref 3.87–5.11)
RDW: 24.1 % — ABNORMAL HIGH (ref 11.5–15.5)
WBC: 38 10*3/uL — ABNORMAL HIGH (ref 4.0–10.5)

## 2017-12-13 LAB — BASIC METABOLIC PANEL
ANION GAP: 8 (ref 5–15)
BUN: 44 mg/dL — ABNORMAL HIGH (ref 8–23)
CALCIUM: 9.7 mg/dL (ref 8.9–10.3)
CO2: 20 mmol/L — AB (ref 22–32)
CREATININE: 1.64 mg/dL — AB (ref 0.44–1.00)
Chloride: 110 mmol/L (ref 98–111)
GFR, EST AFRICAN AMERICAN: 34 mL/min — AB (ref >60)
GFR, EST NON AFRICAN AMERICAN: 30 mL/min — AB (ref >60)
Glucose, Bld: 82 mg/dL (ref 70–99)
Potassium: 5 mmol/L (ref 3.5–5.1)
SODIUM: 138 mmol/L (ref 135–145)

## 2017-12-13 LAB — PROTIME-INR
INR: 3.5
PROTHROMBIN TIME: 34.9 s — AB (ref 11.4–15.2)

## 2017-12-13 LAB — LACTIC ACID, PLASMA: Lactic Acid, Venous: 2.8 mmol/L (ref 0.5–1.9)

## 2017-12-13 LAB — CBG MONITORING, ED: Glucose-Capillary: 81 mg/dL (ref 70–99)

## 2017-12-13 LAB — I-STAT TROPONIN, ED: Troponin i, poc: 0.07 ng/mL (ref 0.00–0.08)

## 2017-12-13 MED ORDER — ONDANSETRON HCL 4 MG/2ML IJ SOLN
4.0000 mg | Freq: Four times a day (QID) | INTRAMUSCULAR | Status: DC | PRN
  Administered 2017-12-14: 4 mg via INTRAVENOUS
  Filled 2017-12-13: qty 2

## 2017-12-13 MED ORDER — HEPARIN BOLUS VIA INFUSION
4400.0000 [IU] | Freq: Once | INTRAVENOUS | Status: AC
  Administered 2017-12-13: 4400 [IU] via INTRAVENOUS
  Filled 2017-12-13: qty 4400

## 2017-12-13 MED ORDER — SODIUM CHLORIDE 0.9 % IV BOLUS: 1000.0000 mL | Freq: Once | INTRAVENOUS | Status: DC

## 2017-12-13 MED ORDER — LEVOTHYROXINE SODIUM 150 MCG PO TABS
150.0000 ug | ORAL_TABLET | Freq: Every day | ORAL | Status: DC
  Filled 2017-12-13: qty 1

## 2017-12-13 MED ORDER — ONDANSETRON 4 MG PO TBDP
4.0000 mg | ORAL_TABLET | Freq: Once | ORAL | Status: AC
  Administered 2017-12-13: 4 mg via ORAL
  Filled 2017-12-13: qty 1

## 2017-12-13 MED ORDER — SODIUM CHLORIDE 0.9 % IV BOLUS
1000.0000 mL | Freq: Once | INTRAVENOUS | Status: AC
  Administered 2017-12-13: 1000 mL via INTRAVENOUS

## 2017-12-13 MED ORDER — SODIUM CHLORIDE 0.9 % IV SOLN
1.0000 g | Freq: Once | INTRAVENOUS | Status: AC
  Administered 2017-12-13: 1 g via INTRAVENOUS
  Filled 2017-12-13: qty 10

## 2017-12-13 MED ORDER — ONDANSETRON HCL 4 MG PO TABS: 4.0000 mg | ORAL_TABLET | Freq: Four times a day (QID) | ORAL | Status: DC | PRN

## 2017-12-13 MED ORDER — ACETAMINOPHEN 650 MG RE SUPP: 650.0000 mg | Freq: Four times a day (QID) | RECTAL | Status: DC | PRN

## 2017-12-13 MED ORDER — HEPARIN (PORCINE) IN NACL 100-0.45 UNIT/ML-% IJ SOLN
1300.0000 [IU]/h | INTRAMUSCULAR | Status: DC
  Administered 2017-12-13 – 2017-12-14 (×2): 1200 [IU]/h via INTRAVENOUS
  Filled 2017-12-13 (×2): qty 250

## 2017-12-13 MED ORDER — SODIUM CHLORIDE 0.9 % IV SOLN: 1.0000 g | Freq: Once | INTRAVENOUS | Status: DC

## 2017-12-13 MED ORDER — SODIUM CHLORIDE 0.9 % IV SOLN
2.0000 g | INTRAVENOUS | Status: DC
  Administered 2017-12-14 – 2017-12-17 (×4): 2 g via INTRAVENOUS
  Filled 2017-12-13 (×5): qty 20

## 2017-12-13 MED ORDER — ACETAMINOPHEN 325 MG PO TABS: 650.0000 mg | ORAL_TABLET | Freq: Four times a day (QID) | ORAL | Status: DC | PRN

## 2017-12-13 NOTE — ED Triage Notes (Signed)
Pt arrives to ED from home, neighbor came by to check on pt this afternoon and found pt to be hypotensive with bp 70s/40s, and so called EMS. EMS reports pt has hx of afib, rate of 170-120 en route, hypotensive with EMS as well, last pressure 88/52; 885ml NS given. Pt states she missed her dose of eliquis last night. Pt placed in position of comfort with bed locked and lowered, call bell in reach.

## 2017-12-13 NOTE — ED Provider Notes (Addendum)
[facility] EMERGENCY DEPARTMENT Provider Note  CSN: [id number] Arrival date & time: 12/13/17  1450  History   Chief Complaint Chief Complaint  Patient presents with  . Atrial Fibrillation  . Hypotension   HPI Brittney Valentine is a 77 y.o. female with a medical history of atrial fibrillation, PE/DVT, GERD, HTN, cirrhosis and hypothyroidism who presented to the ED for fatigue x1 week. Patient reports having decreased energy and PO intake since she was discharged from the hospital on 12/06/17. Endorses leg swelling and nausea. Denies fever, chest pain, SOB, palpitations, abdominal pain or urinary symptoms. She reports compliance with all medications, but states she has stopped taking Lactulose because of the diarrhea and having issues soiling herself.   Additional history obtained by medical chart. She was diagnosed with a PE at that time and is currently on Eliquis. Patient prescribed Lasix and Lactulose after hospitalization last week.  Past Medical History:  Diagnosis Date  . Acute UTI   . Anemia   . Blood transfusion without reported diagnosis   . Cirrhosis ([place])   . Degenerative disk disease    knees  . Diverticulosis   . Dysrhythmia   . GERD (gastroesophageal reflux disease)   . Hemorrhoids   . History of kidney stones   . Hypertension   . Hypothyroidism   . Obesity   . Paroxysmal atrial fibrillation (HCC)   . Partial small bowel obstruction ([place])   . Personal history of colonic polyps 03/25/2012   8 mm rectal adenoma 03/2012  . Portal hypertensive gastropathy ([place])   . Shingles   . Thyroid disease   . Varices, esophageal ([place])   . Zoster     Patient Active Problem List   Diagnosis Date Noted  . Sepsis ([place]) 12/13/2017  . GERD (gastroesophageal reflux disease) 12/04/2017  . DVT (deep venous thrombosis) ([place]) 12/04/2017  . PE (pulmonary thromboembolism) ([place]) 12/04/2017  . Pleural effusion 12/04/2017  . Acute pulmonary embolism without acute cor  pulmonale (HCC)   . Ascites 09/29/2017  . Chronic diarrhea 09/04/2017  . Long term current use of anticoagulant - apixaban 09/04/2017  . Fatigue 05/28/2017  . Chronic combined systolic and diastolic CHF (congestive heart failure) ([place])   . Dilated cardiomyopathy ([place])   . Contraindication to anticoagulation therapy   . Acute lower UTI 04/11/2017  . Reactive airway disease, mild intermittent, uncomplicated 84/13/2440  . Obstructive sleep apnea 01/08/2015  . Essential hypertension 12/21/2014  . Prediabetes 12/21/2014  . Hypothyroidism 12/21/2014  . Encounter for Medicare annual wellness exam 12/20/2014  . Medication management 09/12/2014  . Vitamin D deficiency 09/12/2014  . Atrial fibrillation with RVR ([place]) 05/11/2014  . Esophageal varices ([place]) 02/09/2014  . Left sided sciatica 02/09/2014  . History of colonic polyps 03/25/2012  . Obesity 09/27/2009  . History of small bowel obstruction 12/31/2007  . Hepatic cirrhosis ([place]) 12/31/2007  . PORTAL HYPERTENSION 12/31/2007    Past Surgical History:  Procedure Laterality Date  . ABDOMINAL HYSTERECTOMY    . APPENDECTOMY  1991  . CHOLECYSTECTOMY  2000  . COLON SURGERY    . COLON SURGERY    . COLONOSCOPY    . CYSTOSCOPY     10-26-17 budyzn  . CYSTOSCOPY/URETEROSCOPY/HOLMIUM LASER/STENT PLACEMENT Left 10/26/2017   Procedure: CYSTOSCOPY, URETEROSCOPY/RETROGRADE/STENT PLACEMENT;  Surgeon: Nickie Retort, MD;  Location: [facility];  Service: Urology;  Laterality: Left;  NEEDS DIGITAL URETEROSCOPE  . CYSTOSCOPY/URETEROSCOPY/HOLMIUM LASER/STENT PLACEMENT Left 11/12/2017   Procedure: CYSTOSCOPY LEFT RETROGRADE PYELOGRAM LEFT URETEROSCOPY HOLMIUM  LASER AND LEFT URETERAL STENT EXCHANGE AND LASER ENDO URETEROTOMY;  Surgeon: Ardis Hughs, MD;  Location: [facility];  Service: Urology;  Laterality: Left;  . ESOPHAGOGASTRODUODENOSCOPY  multiple     OB History   None      Home Medications    Prior to Admission medications   Medication  Sig Start Date End Date Taking? Authorizing Provider  apixaban (ELIQUIS) 5 MG TABS tablet Take 1 tablet (5 mg total) by mouth 2 (two) times daily. 12/12/17  Yes Florencia Reasons, MD  Cholecalciferol (VITAMIN D-3) 1000 UNITS CAPS Take 1,000 Units by mouth 2 (two) times daily.    Yes [provider]  furosemide (LASIX) 20 MG tablet Take 20 mg by mouth 2 (two) times daily.   Yes [provider]  lactulose (CHRONULAC) 10 GM/15ML solution Take 15 mLs (10 g total) by mouth daily. 12/07/17  Yes Florencia Reasons, MD  levothyroxine (SYNTHROID) 150 MCG tablet Take 1 tablet (150 mcg total) by mouth daily. 12/06/17 12/06/18 Yes Florencia Reasons, MD  metoprolol succinate (TOPROL-XL) 25 MG 24 hr tablet Take 1 tablet (25 mg total) by mouth daily. 12/07/17  Yes Florencia Reasons, MD  ranitidine (ZANTAC) 300 MG tablet Take 1 tablet (300 mg total) at bedtime by mouth. Patient taking differently: Take 300 mg by mouth at bedtime as needed for heartburn.  04/22/17 04/22/18 Yes Vicie Mutters, PA-C  spironolactone (ALDACTONE) 100 MG tablet Take 1 tablet (100 mg total) by mouth daily. 12/06/17  Yes Florencia Reasons, MD  apixaban (ELIQUIS) 5 MG TABS tablet Take 2 tablets (10 mg total) by mouth 2 (two) times daily for 6 days. Patient not taking: Reported on 12/13/2017 12/06/17 12/13/17  Florencia Reasons, MD    Family History Family History  Problem Relation Age of Onset  . Heart failure Mother        died from  . Hypertension Mother   . Heart attack Father        died from  . Hypertension Sister     Social History Social History   Tobacco Use  . Smoking status: Never Smoker  . Smokeless tobacco: Never Used  Substance Use Topics  . Alcohol use: No  . Drug use: No     Allergies   Amoxicillin; Ciprofloxacin; Sulfonamide derivatives; Robaxin [methocarbamol]; and Verapamil   Review of Systems Review of Systems  Constitutional: Positive for activity change, appetite change and fatigue. Negative for chills and fever.  HENT: Negative.     Respiratory: Negative for cough, shortness of breath and wheezing.   Cardiovascular: Positive for leg swelling. Negative for chest pain and palpitations.  Gastrointestinal: Positive for nausea. Negative for abdominal pain, constipation, diarrhea and vomiting.  Genitourinary: Negative for decreased urine volume, dysuria, frequency, hematuria and urgency.  Skin: Negative.   Allergic/Immunologic: Negative.   Neurological: Positive for weakness. Negative for dizziness, light-headedness, numbness and headaches.  Hematological: Bruises/bleeds easily.       On Eliquis     Physical Exam Updated Vital Signs BP 102/76   Pulse (!) 138   Temp 97.9 F (36.6 C) (Oral)   Resp (!) 22   Ht 5\' 5"  (1.651 m)   Wt 99.8 kg (220 lb)   SpO2 95%   BMI 36.61 kg/m   Physical Exam  Constitutional: She is cooperative. She appears ill.  HENT:  Mouth/Throat: Uvula is midline and oropharynx is clear and moist. Mucous membranes are dry.  Eyes: Pupils are equal, round, and reactive to light. Conjunctivae, EOM and lids  are normal.  Neck: Normal range of motion and full passive range of motion without pain. Neck supple.  Cardiovascular: An irregular rhythm present. Tachycardia present.  Pulses:      Radial pulses are 2+ on the right side, and 2+ on the left side.  Difficult to palpate lower extremity pulses due to swelling bilaterally.  Pulmonary/Chest: Effort normal and breath sounds normal. No accessory muscle usage. No tachypnea. She has no decreased breath sounds. She has no wheezes. She has no rales.  Abdominal: Soft. Normal appearance and bowel sounds are normal. There is no hepatomegaly. There is no tenderness.  Musculoskeletal:  2+ pitting edema in lower extremities bilaterally to the shin.  Neurological: She is alert. She has normal strength. No cranial nerve deficit or sensory deficit. She exhibits normal muscle tone.  Skin: Skin is warm and intact. Capillary refill takes 2 to 3 seconds. No abrasion  noted. No erythema.     ED Treatments / Results  Labs (all labs ordered are listed, but only abnormal results are displayed) Labs Reviewed  BASIC METABOLIC PANEL - Abnormal; Notable for the following components:      Result Value   CO2 20 (*)    BUN 44 (*)    Creatinine, Ser 1.64 (*)    GFR calc non Af Amer 30 (*)    GFR calc Af Amer 34 (*)    All other components within normal limits  CBC - Abnormal; Notable for the following components:   WBC 38.0 (*)    RBC 3.81 (*)    Hemoglobin 11.8 (*)    HCT 35.8 (*)    RDW 24.1 (*)    Platelets 468 (*)    All other components within normal limits  PROTIME-INR - Abnormal; Notable for the following components:   Prothrombin Time 34.9 (*)    All other components within normal limits  URINALYSIS, ROUTINE W REFLEX MICROSCOPIC - Abnormal; Notable for the following components:   Color, Urine AMBER (*)    APPearance CLOUDY (*)    Hgb urine dipstick MODERATE (*)    Protein, ur 30 (*)    Leukocytes, UA LARGE (*)    WBC, UA >50 (*)    Bacteria, UA MANY (*)    All other components within normal limits  I-STAT CG4 LACTIC ACID, ED - Abnormal; Notable for the following components:   Lactic Acid, Venous 3.26 (*)    All other components within normal limits  I-STAT CG4 LACTIC ACID, ED - Abnormal; Notable for the following components:   Lactic Acid, Venous 2.84 (*)    All other components within normal limits  CULTURE, BLOOD (ROUTINE X 2)  CULTURE, BLOOD (ROUTINE X 2)  BRAIN NATRIURETIC PEPTIDE  CBC WITH DIFFERENTIAL/PLATELET  COMPREHENSIVE METABOLIC PANEL  LACTIC ACID, PLASMA  LACTIC ACID, PLASMA  PROCALCITONIN  PROTIME-INR  APTT  MAGNESIUM  TSH  TROPONIN I  TROPONIN I  TROPONIN I  HEPATIC FUNCTION PANEL  LIPASE, BLOOD  I-STAT TROPONIN, ED  CBG MONITORING, ED    EKG EKG Interpretation  Date/Time:  Sunday December 13 2017 14:56:09 EDT Ventricular Rate:  148 PR Interval:    QRS Duration: 130 QT Interval:  332 QTC  Calculation: 521 R Axis:   -30 Text Interpretation:  tachycardia with wide qrs ST-t wave abnormality Abnormal ekg Confirmed by Carmin Muskrat [phone]) on 12/13/2017 3:38:13 PM   Radiology Dg Chest 2 View  Result Date: 12/13/2017 CLINICAL DATA:  Hypotension today. EXAM: CHEST - 2 VIEW COMPARISON:  CT chest 12/04/2017. The single view of the chest 04/14/2017. FINDINGS: There small to moderate bilateral pleural effusions and basilar atelectasis. Cardiomegaly without edema is identified. No pneumothorax. Aortic atherosclerosis is seen. No acute bony abnormality. IMPRESSION: Small to moderate bilateral pleural effusions and basilar atelectasis, greater on the right. Cardiomegaly without edema. Atherosclerosis. Electronically Signed   By: Inge Rise M.D.   On: 12/13/2017 15:46    Procedures .Critical Care Performed by: Romie Jumper, PA-C Authorized by: Romie Jumper, PA-C   Critical care provider statement:    Critical care time (minutes):  90   Critical care start time:  12/13/2017 5:30 PM   Critical care end time:  12/13/2017 8:20 PM   Critical care was necessary to treat or prevent imminent or life-threatening deterioration of the following conditions:  Dehydration and sepsis   Critical care was time spent personally by me on the following activities:  Discussions with consultants, ordering and review of laboratory studies, re-evaluation of patient's condition, review of old charts, examination of patient, evaluation of patient's response to treatment, blood draw for specimens and development of treatment plan with patient or surrogate   I assumed direction of critical care for this patient from another provider in my specialty: no     (including critical care time)  Medications Ordered in ED Medications  cefTRIAXone (ROCEPHIN) 1 g in sodium chloride 0.9 % 100 mL IVPB (has no administration in time range)  levothyroxine (SYNTHROID, LEVOTHROID) tablet 150 mcg (has no  administration in time range)  acetaminophen (TYLENOL) tablet 650 mg (has no administration in time range)    Or  acetaminophen (TYLENOL) suppository 650 mg (has no administration in time range)  ondansetron (ZOFRAN) tablet 4 mg (has no administration in time range)    Or  ondansetron (ZOFRAN) injection 4 mg (has no administration in time range)  cefTRIAXone (ROCEPHIN) 2 g in sodium chloride 0.9 % 100 mL IVPB (has no administration in time range)  sodium chloride 0.9 % bolus 1,000 mL (0 mLs Intravenous Stopped 12/13/17 1644)  ondansetron (ZOFRAN-ODT) disintegrating tablet 4 mg (4 mg Oral Given 12/13/17 1704)  sodium chloride 0.9 % bolus 1,000 mL (0 mLs Intravenous Stopped 12/13/17 1928)     Initial Impression / Assessment and Plan / ED Course  Triage vital signs and the nursing notes have been reviewed.  Pertinent labs & imaging results that were available during care of the patient were reviewed and considered in medical decision making (see chart for details).  Patient presents tachycardic and hypotensive. Hypotension is possibly due to hypovolemia. Patient has been volume down since discharge from the hospital due to Lasix and Lactulose administration with lack of PO intake. This is likely the primary contributor to her decreased BP and tachycardia. Patient is only on rate control for a-fib so not sure how effective cardioversion would be at this stage. Will re-evaluate need for cardioversion after fluid administration.  Clinical Course as of Dec 13 2116  Nancy Fetter Dec 13, 2017  1532 Suspicion that tachycardia and hypotension are compensatory functions given patient's hypovolemia 2/2 Lasix and Lactulose taken since discharge. Will order 1L NS bolus to see if tachycardia and BP are affected.   [GM]  9528 Patient re-evaluated after 1st bag of Ns. Systolic BP increased 41+ systolic and HR was in [id number] while this provider was evaluating. She reports feeling better overall. Will order 2nd NS bolus to  assist with volume status.   [GM]  2725 Remaining labs are concerning. Elevated WBC  at 3 without other s/s of infection at this time. Will order lactic acid.   [GM]  1730 Lactic acid 3.26. Ordered UA and blood cultures to identify a source of infection or to determine if lactic acidosis is 2/2 to severe hypovolemia. CXR normal. No signs of infection or other acute pulm processes.   [GM]  3299 Case discussed with cardiology to discuss indications for cardioversion. At this time, it was determined based on her EKG that sinus tachycardia is appropriate given her lab values and the possibility for infection. Given sinus rhythm, cardiology stated that cardioversion is not indicated in this patient and that IV fluids can be continued. Cardiology may be consulted by [facility] while patient is admitted.   [GM]  1910 UA consistent with UTI. Will initiate IV Rocephin. Given labs and clinical presentation, inpatient hospitalization is recommended for continued IV antibiotics treatment as well as volume replenishment. Consult placed to [facility] for admission.   [GM]  1922 Spoke with [facility] and discussed case. Given lab values, [facility] recommend patient be admitted to critical care.    [GM]  2002 Case discussed with critical care who states that patient can tolerate 3L NS due to her weight. Critical care will not accept patient since she does not require ventilation currently and has been able to maintain BP and has responded positively to fluid administration. [facility] re-consulted who will accept and admit the patient.   [GM]    Clinical Course User Index [GM] Mariajose Mow, Jonelle Sports, PA-C    Final Clinical Impressions(s) / ED Diagnoses  1. Sepsis. Source is urine given UA results. Pending blood cultures. Inpatient hospitalization recommended given severe volume depletion and for IV antibiotics. 2. Hypotension. 3L NS given in the ED.  Dispo: Admit. Case  discussed with [facility] who will admit patient.  Final diagnoses:  Urinary tract infection without hematuria, site unspecified  Hypotension due to hypovolemia    ED Discharge Orders    None       Junita Push 12/13/17 2118    Junita Push 12/13/17 2121    Carmin Muskrat, MD 12/14/17 2124

## 2017-12-13 NOTE — ED Notes (Signed)
Grey urine culture tube collected and sent to lab with UA with temp label.

## 2017-12-13 NOTE — ED Notes (Signed)
Pt placed on pure wick at this time.  

## 2017-12-13 NOTE — H&P (Addendum)
History and Physical    Brittney Valentine WUJ:[id number] DOB: 09-17-1940 DOA: 12/13/2017  PCP: Unk Pinto, MD  Patient coming from: Home.  Chief Complaint: Weakness.  HPI: Brittney Valentine is a 77 y.o. female with history of cirrhosis secondary to [place], A. fib, recently diagnosed pulmonary embolism and DVT of the left lower extremity, hypothyroidism, she has systolic CHF improved during last 2D echo done a week ago presents to the ER with complaints of having feeling weak since discharge last week.  Patient also has benign persistent nausea vomiting over the last 24 hours unable to keep in anything.  Denies any abdominal pain diarrhea chest pain or shortness of breath.  Patient has had recent left ureteral stent placed which was removed last month.  ED Course: In the ER patient is found to be hypotensive with elevated lactate and WBC count.  Patient was in A. fib with RVR.  Patient was given 3 L normal saline bolus for possible sepsis following which blood pressure improved.  Patient is still tachycardic at the time of my exam.  Abdomen appears benign on exam.  Still feeling nauseated but able to drink clear liquids.  UA is consistent with UTI and patient is placed on ceftriaxone for sepsis secondary to UTI.  Review of Systems: As per HPI, rest all negative.   Past Medical History:  Diagnosis Date  . Acute UTI   . Anemia   . Blood transfusion without reported diagnosis   . Cirrhosis ([place])   . Degenerative disk disease    knees  . Diverticulosis   . Dysrhythmia   . GERD (gastroesophageal reflux disease)   . Hemorrhoids   . History of kidney stones   . Hypertension   . Hypothyroidism   . Obesity   . Paroxysmal atrial fibrillation (HCC)   . Partial small bowel obstruction ([place])   . Personal history of colonic polyps 03/25/2012   8 mm rectal adenoma 03/2012  . Portal hypertensive gastropathy ([place])   . Shingles   . Thyroid disease   . Varices, esophageal ([place])   . Zoster      Past Surgical History:  Procedure Laterality Date  . ABDOMINAL HYSTERECTOMY    . APPENDECTOMY  1991  . CHOLECYSTECTOMY  2000  . COLON SURGERY    . COLON SURGERY    . COLONOSCOPY    . CYSTOSCOPY     10-26-17 budyzn  . CYSTOSCOPY/URETEROSCOPY/HOLMIUM LASER/STENT PLACEMENT Left 10/26/2017   Procedure: CYSTOSCOPY, URETEROSCOPY/RETROGRADE/STENT PLACEMENT;  Surgeon: Nickie Retort, MD;  Location: [facility];  Service: Urology;  Laterality: Left;  NEEDS DIGITAL URETEROSCOPE  . CYSTOSCOPY/URETEROSCOPY/HOLMIUM LASER/STENT PLACEMENT Left 11/12/2017   Procedure: CYSTOSCOPY LEFT RETROGRADE PYELOGRAM LEFT URETEROSCOPY HOLMIUM LASER AND LEFT URETERAL STENT EXCHANGE AND LASER ENDO URETEROTOMY;  Surgeon: Ardis Hughs, MD;  Location: [facility];  Service: Urology;  Laterality: Left;  . ESOPHAGOGASTRODUODENOSCOPY  multiple     reports that she has never smoked. She has never used smokeless tobacco. She reports that she does not drink alcohol or use drugs.  Allergies  Allergen Reactions  . Amoxicillin Palpitations    Has patient had a PCN reaction causing immediate rash, facial/tongue/throat swelling, SOB or lightheadedness with hypotension: No Has patient had a PCN reaction causing severe rash involving mucus membranes or skin necrosis: No Has patient had a PCN reaction that required hospitalization: No Has patient had a PCN reaction occurring within the last 10 years: No If all of the above answers are "NO", then may  proceed with Cephalosporin use.   . Ciprofloxacin Swelling  . Sulfonamide Derivatives Other (See Comments)    See little dots, skin feels like its burning  . Robaxin [Methocarbamol] Palpitations  . Verapamil Palpitations    Family History  Problem Relation Age of Onset  . Heart failure Mother        died from  . Hypertension Mother   . Heart attack Father        died from  . Hypertension Sister     Prior to Admission medications   Medication Sig Start Date End Date  Taking? Authorizing Provider  apixaban (ELIQUIS) 5 MG TABS tablet Take 1 tablet (5 mg total) by mouth 2 (two) times daily. 12/12/17  Yes Florencia Reasons, MD  Cholecalciferol (VITAMIN D-3) 1000 UNITS CAPS Take 1,000 Units by mouth 2 (two) times daily.    Yes [provider]  furosemide (LASIX) 20 MG tablet Take 20 mg by mouth 2 (two) times daily.   Yes [provider]  lactulose (CHRONULAC) 10 GM/15ML solution Take 15 mLs (10 g total) by mouth daily. 12/07/17  Yes Florencia Reasons, MD  levothyroxine (SYNTHROID) 150 MCG tablet Take 1 tablet (150 mcg total) by mouth daily. 12/06/17 12/06/18 Yes Florencia Reasons, MD  metoprolol succinate (TOPROL-XL) 25 MG 24 hr tablet Take 1 tablet (25 mg total) by mouth daily. 12/07/17  Yes Florencia Reasons, MD  ranitidine (ZANTAC) 300 MG tablet Take 1 tablet (300 mg total) at bedtime by mouth. Patient taking differently: Take 300 mg by mouth at bedtime as needed for heartburn.  04/22/17 04/22/18 Yes Vicie Mutters, PA-C  spironolactone (ALDACTONE) 100 MG tablet Take 1 tablet (100 mg total) by mouth daily. 12/06/17  Yes Florencia Reasons, MD  apixaban (ELIQUIS) 5 MG TABS tablet Take 2 tablets (10 mg total) by mouth 2 (two) times daily for 6 days. Patient not taking: Reported on 12/13/2017 12/06/17 12/13/17  Florencia Reasons, MD    Physical Exam: Vitals:   12/13/17 1915 12/13/17 1924 12/13/17 2045 12/13/17 2100  BP: (!) 114/92  102/76   Pulse: (!) 139  (!) 138   Resp: (!) 24  (!) 27 (!) 22  Temp:  97.9 F (36.6 C)    TempSrc:  Oral    SpO2: 96%  95%   Weight:      Height:          Constitutional: Moderately built and nourished. Vitals:   12/13/17 1915 12/13/17 1924 12/13/17 2045 12/13/17 2100  BP: (!) 114/92  102/76   Pulse: (!) 139  (!) 138   Resp: (!) 24  (!) 27 (!) 22  Temp:  97.9 F (36.6 C)    TempSrc:  Oral    SpO2: 96%  95%   Weight:      Height:       Eyes: Anicteric no pallor. ENMT: No discharge from the ears eyes nose or mouth. Neck: No mass palpated no neck rigidity no JVD  appreciated. Respiratory: No rhonchi or crepitations. Cardiovascular: S1-S2 heard no murmurs appreciated. Abdomen: Soft nontender bowel sounds present. Musculoskeletal: Left lower extremity edema. Skin: No rash. Neurologic: Alert awake oriented to time place and person.  Moves all extremities. Psychiatric: Appears normal per normal affect.   Labs on Admission: I have personally reviewed following labs and imaging studies  CBC: Recent Labs  Lab 12/13/17 1514  WBC 38.0*  HGB 11.8*  HCT 35.8*  MCV 94.0  PLT 419*   Basic Metabolic Panel: Recent Labs  Lab 12/13/17  1514  NA 138  K 5.0  CL 110  CO2 20*  GLUCOSE 82  BUN 44*  CREATININE 1.64*  CALCIUM 9.7   GFR: Estimated Creatinine Clearance: 35.2 mL/min (A) (by C-G formula based on SCr of 1.64 mg/dL (H)). Liver Function Tests: No results for input(s): AST, ALT, ALKPHOS, BILITOT, PROT, ALBUMIN in the last 168 hours. No results for input(s): LIPASE, AMYLASE in the last 168 hours. No results for input(s): AMMONIA in the last 168 hours. Coagulation Profile: Recent Labs  Lab 12/13/17 1514  INR 3.50   Cardiac Enzymes: No results for input(s): CKTOTAL, CKMB, CKMBINDEX, TROPONINI in the last 168 hours. BNP (last 3 results) No results for input(s): PROBNP in the last 8760 hours. HbA1C: No results for input(s): HGBA1C in the last 72 hours. CBG: Recent Labs  Lab 12/13/17 1501  GLUCAP 81   Lipid Profile: No results for input(s): CHOL, HDL, LDLCALC, TRIG, CHOLHDL, LDLDIRECT in the last 72 hours. Thyroid Function Tests: No results for input(s): TSH, T4TOTAL, FREET4, T3FREE, THYROIDAB in the last 72 hours. Anemia Panel: No results for input(s): VITAMINB12, FOLATE, FERRITIN, TIBC, IRON, RETICCTPCT in the last 72 hours. Urine analysis:    Component Value Date/Time   COLORURINE AMBER (A) 12/13/2017 1732   APPEARANCEUR CLOUDY (A) 12/13/2017 1732   LABSPEC 1.017 12/13/2017 1732   PHURINE 5.0 12/13/2017 1732   GLUCOSEU  NEGATIVE 12/13/2017 1732   HGBUR MODERATE (A) 12/13/2017 1732   BILIRUBINUR NEGATIVE 12/13/2017 1732   KETONESUR NEGATIVE 12/13/2017 1732   PROTEINUR 30 (A) 12/13/2017 1732   UROBILINOGEN 1 12/21/2014 1004   NITRITE NEGATIVE 12/13/2017 1732   LEUKOCYTESUR LARGE (A) 12/13/2017 1732   Sepsis Labs: @LABRCNTIP (procalcitonin:4,lacticidven:4) ) Recent Results (from the past 240 hour(s))  Urine Culture     Status: None   Collection Time: 12/04/17  8:37 AM  Result Value Ref Range Status   MICRO NUMBER: [id number]  Final   SPECIMEN QUALITY: ADEQUATE  Final   Sample Source URINE  Final   STATUS: FINAL  Final   Result:   Final    Multiple organisms present, each less than 10,000 CFU/mL. These organisms, commonly found on external and internal genitalia, are considered to be colonizers. No further testing performed.  MRSA PCR Screening     Status: None   Collection Time: 12/05/17  4:27 AM  Result Value Ref Range Status   MRSA by PCR NEGATIVE NEGATIVE Final    Comment:        The GeneXpert MRSA Assay (FDA approved for NASAL specimens only), is one component of a comprehensive MRSA colonization surveillance program. It is not intended to diagnose MRSA infection nor to guide or monitor treatment for MRSA infections. Performed at [facility], [place] [address], [place], [place] [address]      Radiological Exams on Admission: Dg Chest 2 View  Result Date: 12/13/2017 CLINICAL DATA:  Hypotension today. EXAM: CHEST - 2 VIEW COMPARISON:  CT chest 12/04/2017. The single view of the chest 04/14/2017. FINDINGS: There small to moderate bilateral pleural effusions and basilar atelectasis. Cardiomegaly without edema is identified. No pneumothorax. Aortic atherosclerosis is seen. No acute bony abnormality. IMPRESSION: Small to moderate bilateral pleural effusions and basilar atelectasis, greater on the right. Cardiomegaly without edema. Atherosclerosis. Electronically Signed   By: Inge Rise  M.D.   On: 12/13/2017 15:46    EKG: Independently reviewed.  A. fib with RVR.  Assessment/Plan Principal Problem:   Sepsis ([place]) Active Problems:   Hepatic cirrhosis ([place])  Esophageal varices (HCC)   Atrial fibrillation with RVR (HCC)   Hypothyroidism   Obstructive sleep apnea   Acute lower UTI   DVT (deep venous thrombosis) (HCC)   PE (pulmonary thromboembolism) ([place])    1. Sepsis likely from UTI.  Since patient has had recent left ureteral stent placed on a monitor CT renal study to make sure there is no obstruction.  Follow cultures.  Hesitant to give more fluids since patient looks mildly fluid overloaded and chest x-ray shows pleural effusion.  Follow lactate pro calcitonin levels. 2. A. fib with RVR -likely precipitated by sepsis.  Patient unable to take orals at this time due to vomiting has been placed on heparin.  Discussed with cardiologist will be seeing patient in consult with regard to A. fib with RVR with low normal blood pressure.  Cycle cardiac markers check TSH. 3. Anemia with drop in hemoglobin from recent past.  Will check stool for occult blood.  Follow CBC type and screen. 4. Recently diagnosed PE and DVT.  Will keep patient on heparin infusion since patient cannot take orally due to vomiting. 5. Persistent nausea vomiting over the last 24 hours.  Follow CT renal stone study to rule out any obstruction in the ureter on GI issues.  Follow LFTs and lipase. 6. History of cirrhosis of the liversecondary to [place].  Holding Lasix and spironolactone due to low normal blood pressure.  Closely follow respiratory status. 7. Hypothyroidism on Synthroid.  Will dose IV if patient cannot take orally.   DVT prophylaxis: Heparin. Code Status: Partial. Family Communication: Discussed with patient. Disposition Plan: Home. Consults called: Cardiology. Admission status: Inpatient.   Rise Patience MD [facility] Pager [phone].  If 7PM-7AM, please contact  night-coverage [email or web address] Password [email or web address]  12/13/2017, 9:05 PM

## 2017-12-13 NOTE — ED Notes (Signed)
Dr Vanita Panda informed of lactic acid values 2.84

## 2017-12-13 NOTE — Consult Note (Signed)
Reason for Consult: afib   Requesting Physician/Service: [facility] Hal Hope   PCP:  Unk Pinto, MD Primary Cardiologist:Hochrein/Alred  HPI:  This is a 77 YO woman, with history of persistent AF, previous cardiomyopathy most recent EF recovered, hypothyroidism, CKD, cirrhosis with varices, recent pulmonary embolism--she is being admitted for presumed urosepsis and we are consulted for atrial fibrillation.  Regarding her history, she was previously treated with flecainide although this was discontinued due to VT.  Amiodarone also discontinued in the past due to cirrhosis and thyroid dysfunction.  As an outpatient, there was a plan to initiate her on sotalol after being on anticoagulation for 3 weeks.  However, this had been postponed due to a recent admission for pulmonary embolism.  The most recent strategy was rate control with 25 mg of metoprolol and anticoagulation with Eliquis (PE dosing initially).  Echocardiogram 2018 in November showed EF was 35 to 40%, this was improved during most recent admission to 55 to 60%.  No etiology of the original reduction was known and this was suspected to be rate related.  She presented to the ED for fatigue, decreased energy, poor intake, leg swelling, nausea.  Lead work revealed creatinine 1.64, potassium 5.0, white blood cells 38, lactic acid 3.26.  Initial troponin was normal.  Initial vital signs include A. fib rate 130s with blood pressure 85/57.  This improved after multiple liters of fluid to a peak of 114/92 with a pulse of 139.  ECG with a left bundle branch block rate 140s.  Left bundle branch block is not new.  She was admitted to hospitalist for presumed urosepsis.  We are consulted for atrial fibrillation management.  On interview, she states that she feels dramatically better after receiving fluids.  No chest pain, shortness of breath.  She notes left worse than right lower extremity edema (left always worse given recent DVT).  Right  lower extremity swelling slightly worse than normal.  No abdominal pain.  She wonders if her UTI problems are related to a large hemorrhoid that she currently has.  She is able to lay flat comfortably.     Past Medical History:  Diagnosis Date  . Acute UTI   . Anemia   . Blood transfusion without reported diagnosis   . Cirrhosis ([facility])   . Degenerative disk disease    knees  . Diverticulosis   . Dysrhythmia   . GERD (gastroesophageal reflux disease)   . Hemorrhoids   . History of kidney stones   . Hypertension   . Hypothyroidism   . Obesity   . Paroxysmal atrial fibrillation ([facility])   . Partial small bowel obstruction ([place])   . Personal history of colonic polyps 03/25/2012   8 mm rectal adenoma 03/2012  . Portal hypertensive gastropathy ([place])   . Shingles   . Thyroid disease   . Varices, esophageal ([place])   . Zoster     Past Surgical History:  Procedure Laterality Date  . ABDOMINAL HYSTERECTOMY    . APPENDECTOMY  1991  . CHOLECYSTECTOMY  2000  . COLON SURGERY    . COLON SURGERY    . COLONOSCOPY    . CYSTOSCOPY     10-26-17 budyzn  . CYSTOSCOPY/URETEROSCOPY/HOLMIUM LASER/STENT PLACEMENT Left 10/26/2017   Procedure: CYSTOSCOPY, URETEROSCOPY/RETROGRADE/STENT PLACEMENT;  Surgeon: Nickie Retort, MD;  Location: [facility];  Service: Urology;  Laterality: Left;  NEEDS DIGITAL URETEROSCOPE  . CYSTOSCOPY/URETEROSCOPY/HOLMIUM LASER/STENT PLACEMENT Left 11/12/2017   Procedure: CYSTOSCOPY LEFT RETROGRADE PYELOGRAM LEFT URETEROSCOPY HOLMIUM LASER AND  LEFT URETERAL STENT EXCHANGE AND LASER ENDO URETEROTOMY;  Surgeon: Ardis Hughs, MD;  Location: [facility];  Service: Urology;  Laterality: Left;  . ESOPHAGOGASTRODUODENOSCOPY  multiple    Family History  Problem Relation Age of Onset  . Heart failure Mother        died from  . Hypertension Mother   . Heart attack Father        died from  . Hypertension Sister    Social History:  reports that she has never smoked. She has never  used smokeless tobacco. She reports that she does not drink alcohol or use drugs.  Allergies:  Allergies  Allergen Reactions  . Amoxicillin Palpitations    Has patient had a PCN reaction causing immediate rash, facial/tongue/throat swelling, SOB or lightheadedness with hypotension: No Has patient had a PCN reaction causing severe rash involving mucus membranes or skin necrosis: No Has patient had a PCN reaction that required hospitalization: No Has patient had a PCN reaction occurring within the last 10 years: No If all of the above answers are "NO", then may proceed with Cephalosporin use.   . Ciprofloxacin Swelling  . Sulfonamide Derivatives Other (See Comments)    See little dots, skin feels like its burning  . Robaxin [Methocarbamol] Palpitations  . Verapamil Palpitations    No current facility-administered medications on file prior to encounter.    Current Outpatient Medications on File Prior to Encounter  Medication Sig Dispense Refill  . apixaban (ELIQUIS) 5 MG TABS tablet Take 1 tablet (5 mg total) by mouth 2 (two) times daily. 60 tablet 0  . Cholecalciferol (VITAMIN D-3) 1000 UNITS CAPS Take 1,000 Units by mouth 2 (two) times daily.     . furosemide (LASIX) 20 MG tablet Take 20 mg by mouth 2 (two) times daily.    Marland Kitchen lactulose (CHRONULAC) 10 GM/15ML solution Take 15 mLs (10 g total) by mouth daily. 240 mL 0  . levothyroxine (SYNTHROID) 150 MCG tablet Take 1 tablet (150 mcg total) by mouth daily. 30 tablet 11  . metoprolol succinate (TOPROL-XL) 25 MG 24 hr tablet Take 1 tablet (25 mg total) by mouth daily. 30 tablet 0  . ranitidine (ZANTAC) 300 MG tablet Take 1 tablet (300 mg total) at bedtime by mouth. (Patient taking differently: Take 300 mg by mouth at bedtime as needed for heartburn. ) 30 tablet 1  . spironolactone (ALDACTONE) 100 MG tablet Take 1 tablet (100 mg total) by mouth daily. 30 tablet 0  . apixaban (ELIQUIS) 5 MG TABS tablet Take 2 tablets (10 mg total) by mouth 2  (two) times daily for 6 days. (Patient not taking: Reported on 12/13/2017) 24 tablet 0    @medshecduled @ @medinfusions @  Results for orders placed or performed during the hospital encounter of 12/13/17 (from the past 48 hour(s))  CBG monitoring, ED     Status: None   Collection Time: 12/13/17  3:01 PM  Result Value Ref Range   Glucose-Capillary 81 70 - 99 mg/dL  Basic metabolic panel     Status: Abnormal   Collection Time: 12/13/17  3:14 PM  Result Value Ref Range   Sodium 138 135 - 145 mmol/L   Potassium 5.0 3.5 - 5.1 mmol/L   Chloride 110 98 - 111 mmol/L    Comment: Please note change in reference range.   CO2 20 (L) 22 - 32 mmol/L   Glucose, Bld 82 70 - 99 mg/dL    Comment: Please note change in reference range.  BUN 44 (H) 8 - 23 mg/dL    Comment: Please note change in reference range.   Creatinine, Ser 1.64 (H) 0.44 - 1.00 mg/dL   Calcium 9.7 8.9 - 10.3 mg/dL   GFR calc non Af Amer 30 (L) >60 mL/min   GFR calc Af Amer 34 (L) >60 mL/min    Comment: (NOTE) The eGFR has been calculated using the CKD EPI equation. This calculation has not been validated in all clinical situations. eGFR's persistently <60 mL/min signify possible Chronic Kidney Disease.    Anion gap 8 5 - 15    Comment: Performed at [facility] [address], [place], [place] [address]  CBC     Status: Abnormal   Collection Time: 12/13/17  3:14 PM  Result Value Ref Range   WBC 38.0 (H) 4.0 - 10.5 K/uL   RBC 3.81 (L) 3.87 - 5.11 MIL/uL   Hemoglobin 11.8 (L) 12.0 - 15.0 g/dL   HCT 35.8 (L) 36.0 - 46.0 %   MCV 94.0 78.0 - 100.0 fL   MCH 31.0 26.0 - 34.0 pg   MCHC 33.0 30.0 - 36.0 g/dL   RDW 24.1 (H) 11.5 - 15.5 %   Platelets 468 (H) 150 - 400 K/uL    Comment: Performed at [facility] [address], [place], [place] [address]  Protime-INR- (order if Patient is taking Coumadin / Warfarin)     Status: Abnormal   Collection Time: 12/13/17  3:14 PM  Result Value Ref Range   Prothrombin Time  34.9 (H) 11.4 - 15.2 seconds   INR 3.50     Comment: Performed at [facility] [address], [place], [place] [address]  I-stat troponin, ED     Status: None   Collection Time: 12/13/17  3:18 PM  Result Value Ref Range   Troponin i, poc 0.07 0.00 - 0.08 ng/mL   Comment 3            Comment: Due to the release kinetics of cTnI, a negative result within the first hours of the onset of symptoms does not rule out myocardial infarction with certainty. If myocardial infarction is still suspected, repeat the test at appropriate intervals.   I-Stat CG4 Lactic Acid, ED     Status: Abnormal   Collection Time: 12/13/17  5:13 PM  Result Value Ref Range   Lactic Acid, Venous 3.26 (HH) 0.5 - 1.9 mmol/L   Comment NOTIFIED PHYSICIAN   Urinalysis, Routine w reflex microscopic     Status: Abnormal   Collection Time: 12/13/17  5:32 PM  Result Value Ref Range   Color, Urine AMBER (A) YELLOW    Comment: BIOCHEMICALS MAY BE AFFECTED BY COLOR   APPearance CLOUDY (A) CLEAR   Specific Gravity, Urine 1.017 1.005 - 1.030   pH 5.0 5.0 - 8.0   Glucose, UA NEGATIVE NEGATIVE mg/dL   Hgb urine dipstick MODERATE (A) NEGATIVE   Bilirubin Urine NEGATIVE NEGATIVE   Ketones, ur NEGATIVE NEGATIVE mg/dL   Protein, ur 30 (A) NEGATIVE mg/dL   Nitrite NEGATIVE NEGATIVE   Leukocytes, UA LARGE (A) NEGATIVE   RBC / HPF 21-50 0 - 5 RBC/hpf   WBC, UA >50 (H) 0 - 5 WBC/hpf   Bacteria, UA MANY (A) NONE SEEN   Squamous Epithelial / LPF 21-50 0 - 5   Renal Epithelial 8    Mucus PRESENT     Comment: Performed at [facility], [address]  [place], [place] [address]  I-Stat CG4 Lactic Acid, ED     Status: Abnormal   Collection Time: 12/13/17  7:38 PM  Result Value Ref Range   Lactic Acid, Venous 2.84 (HH) 0.5 - 1.9 mmol/L   Comment NOTIFIED PHYSICIAN    Dg Chest 2 View  Result Date: 12/13/2017 CLINICAL DATA:  Hypotension today. EXAM: CHEST - 2 VIEW COMPARISON:  CT chest 12/04/2017. The single view of  the chest 04/14/2017. FINDINGS: There small to moderate bilateral pleural effusions and basilar atelectasis. Cardiomegaly without edema is identified. No pneumothorax. Aortic atherosclerosis is seen. No acute bony abnormality. IMPRESSION: Small to moderate bilateral pleural effusions and basilar atelectasis, greater on the right. Cardiomegaly without edema. Atherosclerosis. Electronically Signed   By: Inge Rise M.D.   On: 12/13/2017 15:46    ECG/TELE: Left bundle branch block rate 140s  ROS: As above. Otherwise, review of systems is negative unless per above HPI  Vitals:   12/13/17 1915 12/13/17 1924 12/13/17 2045 12/13/17 2100  BP: (!) 114/92  102/76   Pulse: (!) 139  (!) 138   Resp: (!) 24  (!) 27 (!) 22  Temp:  97.9 F (36.6 C)    TempSrc:  Oral    SpO2: 96%  95%   Weight:      Height:       Wt Readings from Last 10 Encounters:  12/13/17 99.8 kg (220 lb)  12/05/17 99.9 kg (220 lb 3.8 oz)  12/03/17 96.6 kg (213 lb)  12/03/17 97 kg (213 lb 12.8 oz)  11/25/17 93.4 kg (206 lb)  11/25/17 93.8 kg (206 lb 12.8 oz)  11/12/17 94.4 kg (208 lb 2 oz)  10/29/17 94.3 kg (208 lb)  10/26/17 92.5 kg (204 lb)  10/22/17 92.5 kg (204 lb)    PE:  General: No acute distress HEENT: Atraumatic, EOMI, JVD at clavicle at 45 degrees. Mild HJR. CV: tachy irr irr no murmurs, gallops.  Respiratory: Clear, no crackles. Normal work of breathing ABD: obeses, NTTD Extremities: 2+ radial pulses bilaterally. 2+ RLE, 1+ LLE edema. Neuro/Psych: CN grossly intact, alert and oriented  Assessment/Plan 1. Atrial Fibrillation with RVR - Would expect her HR to be elevated in the setting of her presumed sepsis and recent PE. BP improved with fluids.  Recent EF normal.  Would tolerate higher heart rates overnight (130s-140s) given improved BP and symptoms, if worsening rates with stable BP can give home Toprol 25 mg XL.  Rate control strategy but will tolerate higher rates.   - Avoid amio this given her  hypothyroidism and elevated LFT's.  - Continue Eliquis, or switch to heparin if suspect need for invasive procedures - ACLS for acute unstable rhythms  2. History of Cardiomyopathy, presumed rate related in the past; recently preserved EF  - EF was variable from 65-70% to 35-40% by imaging during her hospitalization in 04/2017. Has not undergone ischemic evaluation as an outpatient. Remains on PO Lasix 70m BID and BB therapy as an outpatient. Not on ACE-I/ARB/ARNI given soft BP.   - Hold home lasix and BB for now as above and can re-introduce BB once BP more stable - Fluid challenge as needed for sepsis despite LE edema.  Lungs are clear  3. Hypothyroidism - Per triad   4. Recent PE/DVT - Lower extremity dopplers showed evidence of an acute DVT involving the femoral, popliteal, intramuscular gastrocnemius, and peroneal veins of the left lower extremity. CTA showed bilateral pulmonary emboli, right greater than left. No right heart strain  at that time.  - As above with Eliquis or heparin   Lolita Cram Dempsy Damiano  MD 12/13/2017, 9:10 PM

## 2017-12-13 NOTE — Progress Notes (Addendum)
ANTICOAGULATION CONSULT NOTE - Initial Consult  Pharmacy Consult for heparin Indication: pulmonary embolus  Allergies  Allergen Reactions  . Amoxicillin Palpitations    Has patient had Brittney PCN reaction causing immediate rash, facial/tongue/throat swelling, SOB or lightheadedness with hypotension: No Has patient had Brittney PCN reaction causing severe rash involving mucus membranes or skin necrosis: No Has patient had Brittney PCN reaction that required hospitalization: No Has patient had Brittney PCN reaction occurring within the last 10 years: No If all of the above answers are "NO", then may proceed with Cephalosporin use.   . Ciprofloxacin Swelling  . Sulfonamide Derivatives Other (See Comments)    See little dots, skin feels like its burning  . Robaxin [Methocarbamol] Palpitations  . Verapamil Palpitations    Patient Measurements: Height: 5\' 2"  (157.5 cm) Weight: 219 lb 9.3 oz (99.6 kg) IBW/kg (Calculated) : 50.1 Heparin Dosing Weight: 73.7 kg  Vital Signs: Temp: 98.7 F (37.1 C) (07/07 2128) Temp Source: Oral (07/07 2128) BP: 98/68 (07/07 2128) Pulse Rate: 138 (07/07 2128)  Labs: Recent Labs    12/13/17 1514  HGB 11.8*  HCT 35.8*  PLT 468*  LABPROT 34.9*  INR 3.50  CREATININE 1.64*    Estimated Creatinine Clearance: 33.2 mL/min (Brittney) (by C-G formula based on SCr of 1.64 mg/dL (H)).   Medical History: Past Medical History:  Diagnosis Date  . Acute UTI   . Anemia   . Blood transfusion without reported diagnosis   . Cirrhosis ([facility])   . Degenerative disk disease    knees  . Diverticulosis   . Dysrhythmia   . GERD (gastroesophageal reflux disease)   . Hemorrhoids   . History of kidney stones   . Hypertension   . Hypothyroidism   . Obesity   . Paroxysmal atrial fibrillation ([facility])   . Partial small bowel obstruction ([place])   . Personal history of colonic polyps 03/25/2012   8 mm rectal adenoma 03/2012  . Portal hypertensive gastropathy ([place])   . Shingles   . Thyroid  disease   . Varices, esophageal ([place])   . Zoster     Medications:  Scheduled:  . [START ON 12/14/2017] levothyroxine  150 mcg Oral QAC breakfast    Assessment: 70 YOF admitted with PE. Currently on Eliquis for Afib/PE/DVT per MD note. Last dose of Eliquis 7/6 1900. Pharmacy consulted to dose heparin for PE. H/H/PLTs wnl.  Goal of Therapy:  Heparin level 0.3-0.7 units/ml aPTT 66-102 seconds Monitor platelets by anticoagulation protocol: Yes   Plan:  Give 4,400 units bolus x 1 Start heparin infusion at 1,200 units/hr Check anti-Xa level in 8 hours and daily while on heparin Continue to monitor H&H and platelets  Brittney Valentine Brittney Valentine 12/13/2017,9:35 PM

## 2017-12-14 ENCOUNTER — Telehealth: Payer: Self-pay | Admitting: *Deleted

## 2017-12-14 ENCOUNTER — Encounter (HOSPITAL_COMMUNITY): Payer: Self-pay | Admitting: General Practice

## 2017-12-14 DIAGNOSIS — I4891 Unspecified atrial fibrillation: Secondary | ICD-10-CM

## 2017-12-14 DIAGNOSIS — A419 Sepsis, unspecified organism: Secondary | ICD-10-CM

## 2017-12-14 LAB — COMPREHENSIVE METABOLIC PANEL
ALT: 32 U/L (ref 0–44)
ANION GAP: 5 (ref 5–15)
AST: 62 U/L — ABNORMAL HIGH (ref 15–41)
Albumin: 1.8 g/dL — ABNORMAL LOW (ref 3.5–5.0)
Alkaline Phosphatase: 59 U/L (ref 38–126)
BUN: 50 mg/dL — ABNORMAL HIGH (ref 8–23)
CALCIUM: 9.3 mg/dL (ref 8.9–10.3)
CHLORIDE: 113 mmol/L — AB (ref 98–111)
CO2: 21 mmol/L — ABNORMAL LOW (ref 22–32)
CREATININE: 1.63 mg/dL — AB (ref 0.44–1.00)
GFR calc non Af Amer: 30 mL/min — ABNORMAL LOW (ref >60)
GFR, EST AFRICAN AMERICAN: 35 mL/min — AB (ref >60)
Glucose, Bld: 105 mg/dL — ABNORMAL HIGH (ref 70–99)
Potassium: 4.8 mmol/L (ref 3.5–5.1)
Sodium: 139 mmol/L (ref 135–145)
Total Bilirubin: 1.9 mg/dL — ABNORMAL HIGH (ref 0.3–1.2)
Total Protein: 4.7 g/dL — ABNORMAL LOW (ref 6.5–8.1)

## 2017-12-14 LAB — CBC WITH DIFFERENTIAL/PLATELET
BASOS PCT: 0 %
Basophils Absolute: 0 10*3/uL (ref 0.0–0.1)
EOS PCT: 0 %
Eosinophils Absolute: 0 10*3/uL (ref 0.0–0.7)
HCT: 32.2 % — ABNORMAL LOW (ref 36.0–46.0)
Hemoglobin: 10.7 g/dL — ABNORMAL LOW (ref 12.0–15.0)
LYMPHS ABS: 1.7 10*3/uL (ref 0.7–4.0)
Lymphocytes Relative: 7 %
MCH: 30.7 pg (ref 26.0–34.0)
MCHC: 33.2 g/dL (ref 30.0–36.0)
MCV: 92.3 fL (ref 78.0–100.0)
MONO ABS: 1.9 10*3/uL — AB (ref 0.1–1.0)
Monocytes Relative: 8 %
NEUTROS ABS: 20.1 10*3/uL — AB (ref 1.7–7.7)
Neutrophils Relative %: 85 %
PLATELETS: 379 10*3/uL (ref 150–400)
RBC: 3.49 MIL/uL — ABNORMAL LOW (ref 3.87–5.11)
RDW: 24.3 % — ABNORMAL HIGH (ref 11.5–15.5)
WBC: 23.7 10*3/uL — ABNORMAL HIGH (ref 4.0–10.5)

## 2017-12-14 LAB — BLOOD CULTURE ID PANEL (REFLEXED)

## 2017-12-14 LAB — IRON AND TIBC
Iron: 18 ug/dL — ABNORMAL LOW (ref 28–170)
Saturation Ratios: 9 % — ABNORMAL LOW (ref 10.4–31.8)
TIBC: 193 ug/dL — ABNORMAL LOW (ref 250–450)
UIBC: 175 ug/dL

## 2017-12-14 LAB — CBC
HEMATOCRIT: 32 % — AB (ref 36.0–46.0)
Hemoglobin: 10.8 g/dL — ABNORMAL LOW (ref 12.0–15.0)
MCH: 31.1 pg (ref 26.0–34.0)
MCHC: 33.8 g/dL (ref 30.0–36.0)
MCV: 92.2 fL (ref 78.0–100.0)
PLATELETS: 365 10*3/uL (ref 150–400)
RBC: 3.47 MIL/uL — AB (ref 3.87–5.11)
RDW: 24.3 % — ABNORMAL HIGH (ref 11.5–15.5)
WBC: 24.6 10*3/uL — ABNORMAL HIGH (ref 4.0–10.5)

## 2017-12-14 LAB — TYPE AND SCREEN
ABO/RH(D): A POS
Antibody Screen: NEGATIVE

## 2017-12-14 LAB — LACTIC ACID, PLASMA
LACTIC ACID, VENOUS: 2 mmol/L — AB (ref 0.5–1.9)
LACTIC ACID, VENOUS: 2.4 mmol/L — AB (ref 0.5–1.9)
Lactic Acid, Venous: 2.3 mmol/L (ref 0.5–1.9)
Lactic Acid, Venous: 1.9 mmol/L (ref 0.5–1.9)

## 2017-12-14 LAB — BASIC METABOLIC PANEL
ANION GAP: 9 (ref 5–15)
BUN: 53 mg/dL — ABNORMAL HIGH (ref 8–23)
CO2: 20 mmol/L — AB (ref 22–32)
CREATININE: 1.62 mg/dL — AB (ref 0.44–1.00)
Calcium: 9.2 mg/dL (ref 8.9–10.3)
Chloride: 107 mmol/L (ref 98–111)
GFR calc Af Amer: 35 mL/min — ABNORMAL LOW (ref >60)
GFR calc non Af Amer: 30 mL/min — ABNORMAL LOW (ref >60)
Glucose, Bld: 112 mg/dL — ABNORMAL HIGH (ref 70–99)
POTASSIUM: 4.3 mmol/L (ref 3.5–5.1)
Sodium: 136 mmol/L (ref 135–145)

## 2017-12-14 LAB — APTT
APTT: 43 s — AB (ref 24–36)
APTT: 59 s — AB (ref 24–36)
aPTT: 106 seconds — ABNORMAL HIGH (ref 24–36)

## 2017-12-14 LAB — TROPONIN I
Troponin I: 0.11 ng/mL (ref <0.03)
Troponin I: 0.1 ng/mL (ref <0.03)

## 2017-12-14 LAB — ABO/RH: ABO/RH(D): A POS

## 2017-12-14 LAB — C DIFFICILE QUICK SCREEN W PCR REFLEX
C DIFFICILE (CDIFF) INTERP: DETECTED
C DIFFICILE (CDIFF) TOXIN: POSITIVE — AB
C DIFFICLE (CDIFF) ANTIGEN: POSITIVE — AB

## 2017-12-14 LAB — FOLATE: FOLATE: 4.9 ng/mL — AB (ref >5.9)

## 2017-12-14 LAB — HEMOGLOBIN AND HEMATOCRIT, BLOOD
HEMATOCRIT: 32.5 % — AB (ref 36.0–46.0)
HEMOGLOBIN: 10.8 g/dL — AB (ref 12.0–15.0)

## 2017-12-14 LAB — PROTIME-INR
INR: 4.13
PROTHROMBIN TIME: 39.7 s — AB (ref 11.4–15.2)

## 2017-12-14 LAB — OCCULT BLOOD X 1 CARD TO LAB, STOOL: Fecal Occult Bld: POSITIVE — AB

## 2017-12-14 LAB — TSH: TSH: 8.258 u[IU]/mL — AB (ref 0.350–4.500)

## 2017-12-14 LAB — HEPARIN LEVEL (UNFRACTIONATED): Heparin Unfractionated: >2.2 IU/mL — ABNORMAL HIGH (ref 0.30–0.70)

## 2017-12-14 LAB — BRAIN NATRIURETIC PEPTIDE: B Natriuretic Peptide: 408.3 pg/mL — ABNORMAL HIGH (ref 0.0–100.0)

## 2017-12-14 LAB — VITAMIN B12: VITAMIN B 12: 1019 pg/mL — AB (ref 180–914)

## 2017-12-14 LAB — FERRITIN: Ferritin: 212 ng/mL (ref 11–307)

## 2017-12-14 MED ORDER — FOLIC ACID 1 MG PO TABS
1.0000 mg | ORAL_TABLET | Freq: Every day | ORAL | Status: DC
  Administered 2017-12-14 – 2017-12-24 (×11): 1 mg via ORAL
  Filled 2017-12-14 (×11): qty 1

## 2017-12-14 MED ORDER — METOPROLOL TARTRATE 25 MG PO TABS
25.0000 mg | ORAL_TABLET | Freq: Two times a day (BID) | ORAL | Status: DC
  Administered 2017-12-14 – 2017-12-17 (×6): 25 mg via ORAL
  Filled 2017-12-14 (×6): qty 1

## 2017-12-14 MED ORDER — HYDROCORTISONE ACETATE 25 MG RE SUPP: 25.0000 mg | Freq: Two times a day (BID) | RECTAL | Status: DC

## 2017-12-14 MED ORDER — VITAMIN K1 10 MG/ML IJ SOLN
1.0000 mg | Freq: Once | INTRAVENOUS | Status: DC
  Filled 2017-12-14: qty 0.1

## 2017-12-14 MED ORDER — VANCOMYCIN HCL 10 G IV SOLR
1500.0000 mg | Freq: Once | INTRAVENOUS | Status: DC
  Administered 2017-12-14: 1500 mg via INTRAVENOUS
  Filled 2017-12-14: qty 1500

## 2017-12-14 MED ORDER — METOPROLOL TARTRATE 12.5 MG HALF TABLET
12.5000 mg | ORAL_TABLET | Freq: Two times a day (BID) | ORAL | Status: DC
  Administered 2017-12-14: 12.5 mg via ORAL
  Filled 2017-12-14: qty 1

## 2017-12-14 MED ORDER — SODIUM CHLORIDE 0.9 % IV BOLUS
500.0000 mL | Freq: Once | INTRAVENOUS | Status: AC
  Administered 2017-12-14: 500 mL via INTRAVENOUS

## 2017-12-14 MED ORDER — VANCOMYCIN 50 MG/ML ORAL SOLUTION
125.0000 mg | Freq: Four times a day (QID) | ORAL | Status: DC
  Administered 2017-12-14 – 2017-12-24 (×37): 125 mg via ORAL
  Filled 2017-12-14 (×42): qty 2.5

## 2017-12-14 MED ORDER — HEPARIN BOLUS VIA INFUSION
1500.0000 [IU] | Freq: Once | INTRAVENOUS | Status: AC
  Administered 2017-12-14: 1500 [IU] via INTRAVENOUS
  Filled 2017-12-14: qty 1500

## 2017-12-14 MED ORDER — LEVOTHYROXINE SODIUM 100 MCG IV SOLR: 75.0000 ug | Freq: Every day | INTRAVENOUS | Status: DC

## 2017-12-14 MED ORDER — HYDROCORTISONE ACETATE 25 MG RE SUPP
25.0000 mg | Freq: Two times a day (BID) | RECTAL | Status: DC
  Administered 2017-12-14 – 2017-12-15 (×3): 25 mg via RECTAL
  Filled 2017-12-14 (×3): qty 1

## 2017-12-14 MED ORDER — LEVOTHYROXINE SODIUM 75 MCG PO TABS
150.0000 ug | ORAL_TABLET | Freq: Every day | ORAL | Status: DC
  Administered 2017-12-15 – 2017-12-24 (×10): 150 ug via ORAL
  Filled 2017-12-14 (×10): qty 2

## 2017-12-14 MED ORDER — VANCOMYCIN HCL 10 G IV SOLR: 1250.0000 mg | INTRAVENOUS | Status: DC

## 2017-12-14 MED ORDER — SODIUM CHLORIDE 0.9 % IV SOLN
INTRAVENOUS | Status: DC
  Administered 2017-12-14 – 2017-12-16 (×4): via INTRAVENOUS

## 2017-12-14 MED ORDER — METOPROLOL SUCCINATE ER 25 MG PO TB24
25.0000 mg | ORAL_TABLET | Freq: Every day | ORAL | Status: DC
  Filled 2017-12-14: qty 1

## 2017-12-14 MED ORDER — LOPERAMIDE HCL 2 MG PO CAPS
2.0000 mg | ORAL_CAPSULE | Freq: Once | ORAL | Status: AC
  Administered 2017-12-14: 2 mg via ORAL
  Filled 2017-12-14: qty 1

## 2017-12-14 NOTE — Progress Notes (Signed)
CRITICAL VALUE ALERT  Critical Value:  INR- 4.13  Date & Time Notied: 12/14/17 1208   Provider Notified: Hal Hope MD   Orders Received/Actions taken: Continue to monitor   CRITICAL VALUE ALERT  Critical Value:  Lactic acid- 2.8  Date & Time Notied: 12/14/17   Provider Notified: Hal Hope MD  Orders Received/Actions taken: Continue to monitor  Fransico Michael, RN

## 2017-12-14 NOTE — Progress Notes (Addendum)
Progress Note  Patient Name: Brittney Valentine Date of Encounter: 12/14/2017  Primary Cardiologist: Minus Breeding, MD   Subjective   Resting comfortably in bed but can still feel palpitations. No CP or dyspnea.   Inpatient Medications    Scheduled Meds: . folic acid  1 mg Oral Daily  . hydrocortisone  25 mg Rectal BID  . [START ON 12/15/2017] levothyroxine  150 mcg Oral QAC breakfast  . metoprolol tartrate  12.5 mg Oral BID   Continuous Infusions: . sodium chloride 100 mL/hr at 12/14/17 1451  . cefTRIAXone (ROCEPHIN)  IV    . heparin 1,100 Units/hr (12/14/17 1451)  . phytonadione (VITAMIN K) IV Stopped (12/14/17 1003)   PRN Meds: acetaminophen **OR** acetaminophen, ondansetron **OR** ondansetron (ZOFRAN) IV   Vital Signs    Vitals:   12/13/17 2128 12/14/17 0005 12/14/17 0436 12/14/17 0900  BP: 98/68 93/74 (!) 102/49 108/72  Pulse: (!) 138 (!) 129 (!) 129   Resp: (!) 21 (!) 24 18   Temp: 98.7 F (37.1 C) 97.9 F (36.6 C) (!) 97.4 F (36.3 C) 97.7 F (36.5 C)  TempSrc: Oral Oral Oral Oral  SpO2: 95% 93% 95%   Weight: 219 lb 9.3 oz (99.6 kg)     Height: 5\' 2"  (1.575 m)       Intake/Output Summary (Last 24 hours) at 12/14/2017 1509 Last data filed at 12/14/2017 1451 Gross per 24 hour  Intake 2261.74 ml  Output 20 ml  Net 2241.74 ml   Filed Weights   12/13/17 1500 12/13/17 2128  Weight: 220 lb (99.8 kg) 219 lb 9.3 oz (99.6 kg)    Telemetry    Atrial fibrillation w/ RVR - Personally Reviewed  ECG    Afib, LBBB 140s - Personally Reviewed  Physical Exam   GEN: No acute distress.   Neck: No JVD Cardiac: irreguarllly irregular, tachy rate  Respiratory: Clear to auscultation bilaterally. GI: Soft, nontender, non-distended  MS: 2+ edema on the left (DVT), no edema on the right.  Neuro:  Nonfocal  Psych: Normal affect   Labs    Chemistry Recent Labs  Lab 12/13/17 1514 12/14/17 0338  NA 138 139  K 5.0 4.8  CL 110 113*  CO2 20* 21*  GLUCOSE 82 105*    BUN 44* 50*  CREATININE 1.64* 1.63*  CALCIUM 9.7 9.3  PROT  --  4.7*  ALBUMIN  --  1.8*  AST  --  62*  ALT  --  32  ALKPHOS  --  59  BILITOT  --  1.9*  GFRNONAA 30* 30*  GFRAA 34* 35*  ANIONGAP 8 5     Hematology Recent Labs  Lab 12/13/17 1514 12/14/17 0338 12/14/17 0749  WBC 38.0* 23.7* 24.6*  RBC 3.81* 3.49* 3.47*  HGB 11.8* 10.7* 10.8*  HCT 35.8* 32.2* 32.0*  MCV 94.0 92.3 92.2  MCH 31.0 30.7 31.1  MCHC 33.0 33.2 33.8  RDW 24.1* 24.3* 24.3*  PLT 468* 379 365    Cardiac Enzymes Recent Labs  Lab 12/14/17 0338 12/14/17 0749  TROPONINI 0.11* 0.10*    Recent Labs  Lab 12/13/17 1518  TROPIPOC 0.07     BNP Recent Labs  Lab 12/14/17 0338  BNP 408.3*     DDimer No results for input(s): DDIMER in the last 168 hours.   Radiology    Dg Chest 2 View  Result Date: 12/13/2017 CLINICAL DATA:  Hypotension today. EXAM: CHEST - 2 VIEW COMPARISON:  CT chest 12/04/2017. The single  view of the chest 04/14/2017. FINDINGS: There small to moderate bilateral pleural effusions and basilar atelectasis. Cardiomegaly without edema is identified. No pneumothorax. Aortic atherosclerosis is seen. No acute bony abnormality. IMPRESSION: Small to moderate bilateral pleural effusions and basilar atelectasis, greater on the right. Cardiomegaly without edema. Atherosclerosis. Electronically Signed   By: Inge Rise M.D.   On: 12/13/2017 15:46   Ct Renal Stone Study  Result Date: 12/13/2017 CLINICAL DATA:  Nausea, vomiting, weakness, history cirrhosis, GERD, kidney stones, hypertension EXAM: CT ABDOMEN AND PELVIS WITHOUT CONTRAST TECHNIQUE: Multidetector CT imaging of the abdomen and pelvis was performed following the standard protocol without IV contrast. Sagittal and coronal MPR images reconstructed from axial data set. Oral contrast was not administered. COMPARISON:  09/09/2017 FINDINGS: Lower chest: Biatrial enlargement. Bibasilar pleural effusions moderate RIGHT and small LEFT.  Bibasilar atelectasis. Hepatobiliary: Small cirrhotic liver. Post cholecystectomy. Low-attenuation lesion with peripheral calcification lateral segment LEFT lobe liver 2.0 x 1.9 cm not significantly changed. Pancreas: Atrophic pancreas without mass Spleen: Minimally enlarged, 15.8 x 12.9 x 6.8 cm (volume = 730 cm^3). No focal lesion. Adrenals/Urinary Tract: Unremarkable adrenal glands. Atrophic kidneys with minimal chronic nodularity. Persistent mid LEFT ureteral calculus 7 mm diameter. Bladder decompressed. No additional urinary tract calcification. Stomach/Bowel: Prior bowel resection with anastomotic staple line in RIGHT abdomen likely ileocecectomy. Mild diffuse colonic wall thickening versus artifact from underdistention. Additional staple line in the RIGHT upper quadrant at a small bowel loop. This loop demonstrates diffuse bowel wall thickening and is immediately caudal to the transverse colon. Multiple additional small bowel loops appear dilated and demonstrate diffuse wall thickening. Overall appearance is nonspecific. 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 Vascular/Lymphatic: Scattered atherosclerotic calcifications aorta and iliac arteries. Aorta normal caliber. Reproductive: Uterus surgically absent. Nonvisualization of ovaries. Other: Significant ascites unchanged.  No free air.  No hernia. Musculoskeletal: Osseous demineralization. IMPRESSION: Cirrhotic liver with significant ascites and splenomegaly. Diffuse bowel wall thickening of large and small bowel loops throughout abdomen, present since 09/09/2017; differential diagnosis includes enteritis such as from infection or inflammatory bowel disease, portal enteropathy in the setting of cirrhosis, ischemia not excluded but considered less likely due to  chronicity. Bibasilar effusions and atelectasis greater on RIGHT. Chronic mid LEFT ureteral calculus without hydronephrosis/hydroureter. Electronically Signed   By: Lavonia Dana M.D.   On: 12/13/2017 22:55    Cardiac Studies   2D Echo 11/2017 Study Conclusions  - Left ventricle: The cavity size was normal. Wall thickness was   increased in a pattern of mild LVH. Systolic function was normal.   The estimated ejection fraction was in the range of 55% to 60%.   Wall motion was normal; there were no regional wall motion   abnormalities. - Mitral valve: Calcified annulus. There was mild regurgitation. - Left atrium: The atrium was moderately dilated. - Right atrium: The atrium was moderately dilated. - Pulmonary arteries: Systolic pressure was mildly increased. PA   peak pressure: 34 mm Hg (S).  Impressions:  - Normal LV function; mild LVH; mild MR; moderate biatrial   enlargement; mild TR; mild pulmonary hypertension.   Patient Profile     This is a 77 YO woman, with history of persistent AF, previous cardiomyopathy most recent EF recovered, hypothyroidism, CKD, cirrhosis with varices, recent pulmonary embolism--she is being admitted for presumed urosepsis and cardiology was consulted  for atrial fibrillation management.   Regarding her history, she was previously treated with flecainide although this was discontinued due to VT. Amiodarone also discontinued in the past due to cirrhosis and thyroid dysfunction.  As an outpatient, there was a plan to initiate her on sotalol after being on anticoagulation for 3 weeks.  However, this had been postponed due to a recent admission for pulmonary embolism.  The most recent strategy was rate control with 25 mg of metoprolol and anticoagulation with Eliquis (PE dosing initially).   On admit, HR was in the 140s. 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 Now on antibiotics for UTI.   Assessment & Plan     1. Atrial Fibrillation: known persistent atrial fibrillation  but admitted with RVR in the setting of urosepsis/UTI. Now on antibiotics. TSH is high at 8.25 (on levothyroxine). K WNL. Troponin mildly elevated at 0.11. Repeat troponin 0.10. Also with acute anemia w/ gradual drop in Hgb from 17.7 to 10.8 over the last 10 days.  Pt notes recent hemorrhoidal bleeding and melena. FOBT +. Eliquis on hold and IV heparin initiated, in the event that she may need invasive w/u. Hgb today has remained stable at 10.08 and repeat level at 10.7. Rate remains elevated in the 130s. She is only on low dose metoprolol, 12.5 mg however we are limited in our ability to up titrate rate control agents due to soft BP. This is likely in part due to rapid afib, acute blood loss anemia and sepsis. Continue to treat urosepsis and hopefully rates will improve. In the meantime, we will try to further push metoprolol to 25 mg BID and will monitor BP closely. As outlined above in pt profile, AAD options are also limited. Not a good candidate for amiodarone due to cirrhosis and thyroid dysfunction. Pt also had proarrthymias with Flecainide (VT).  As an outpatient, there was a plan to initiate her on sotalol after being on anticoagulation for 3 weeks.  However, this had been postponed due to a recent admission for pulmonary embolism. We will continue to follow along with you.   2. PE/DVT: Eliquis on hold due to suspected GIB. May need invasive w/u. She is now on IV heparin but H/H has remained stable today at 10.7.   3. Urosepsis: antibiotics per IM.      For questions or updates, please contact [place] Please consult [email or web address] for contact info under Cardiology/STEMI.      Signed, Lyda Jester, PA-C  12/14/2017, 3:09 PM     Attending Note:   The patient was seen and examined.  Agree with assessment and plan as noted above.  Changes made to the above note as needed.  Patient seen and independently examined with Ellen Henri, PA .   We discussed all aspects of the  encounter. I agree with the assessment and plan as stated above.  1.   Rapid atrial fib:   Most likely her rapid heart rate with this atrial fibrillation is due to her urosepsis.  She still has an elevated lactic acid level.  Her heart rate was previously fairly well controlled on metoprolol XL 25 mg a day.  We will increase her current dose of metoprolol up to 25 mg twice a day.  We will need to watch closely for hypotension.  Since she has it mostly bedrest, I do not think that she will have syncope or be symptomatic otherwise.  Her Eliquis is currently on hold because of her GI bleed.  She is not a good  candidate for antiarrhythmics.  She had ventricular tachycardia with flecainide.  She has significant liver disease and so is not a good candidate for amiodarone.  It seems that we will have to proceed with a rate control strategy.  We will restart anticoagulation once GI bleeding issues have resolved/stabilized.   I have spent a total of 40 minutes with patient reviewing hospital  notes , telemetry, EKGs, labs and examining patient as well as establishing an assessment and plan that was discussed with the patient. > 50% of time was spent in direct patient care.    Thayer Headings, Brooke Bonito., MD, [facility] 12/14/2017, 4:03 PM [address],  [place] Pager [phone]

## 2017-12-14 NOTE — Progress Notes (Signed)
CRITICAL VALUE ALERT  Critical Value:  Lactic acid 2.4  Date & Time Notied:  12/14/17 11:35 am  Provider Notified: Dr. Loletha Grayer. [place]  Orders Received/Actions taken: orders implemented as appropriate

## 2017-12-14 NOTE — Progress Notes (Signed)
ANTICOAGULATION CONSULT NOTE - Initial Consult  Pharmacy Consult for heparin Indication: pulmonary embolus  Allergies  Allergen Reactions  . Amoxicillin Palpitations    Has patient had a PCN reaction causing immediate rash, facial/tongue/throat swelling, SOB or lightheadedness with hypotension: No Has patient had a PCN reaction causing severe rash involving mucus membranes or skin necrosis: No Has patient had a PCN reaction that required hospitalization: No Has patient had a PCN reaction occurring within the last 10 years: No If all of the above answers are "NO", then may proceed with Cephalosporin use.   . Ciprofloxacin Swelling  . Sulfonamide Derivatives Other (See Comments)    See little dots, skin feels like its burning  . Robaxin [Methocarbamol] Palpitations  . Verapamil Palpitations    Patient Measurements: Height: 5\' 2"  (157.5 cm) Weight: 219 lb 9.3 oz (99.6 kg) IBW/kg (Calculated) : 50.1 Heparin Dosing Weight: 73.7 kg  Vital Signs: Temp: 97.4 F (36.3 C) (07/08 0436) Temp Source: Oral (07/08 0436) BP: 102/49 (07/08 0436) Pulse Rate: 129 (07/08 0436)  Labs: Recent Labs    12/13/17 1514 12/13/17 2259 12/14/17 0338 12/14/17 0720 12/14/17 0749  HGB 11.8*  --  10.7*  --  10.8*  HCT 35.8*  --  32.2*  --  32.0*  PLT 468*  --  379  --  365  APTT  --  43*  --  106*  --   LABPROT 34.9* 39.7*  --   --   --   INR 3.50 4.13*  --   --   --   HEPARINUNFRC  --   --   --  >2.20*  --   CREATININE 1.64*  --  1.63*  --   --   TROPONINI  --   --  0.11*  --  0.10*    Estimated Creatinine Clearance: 33.4 mL/min (A) (by C-G formula based on SCr of 1.63 mg/dL (H)).   Medical History: Past Medical History:  Diagnosis Date  . Acute UTI   . Anemia   . Blood transfusion without reported diagnosis   . Cirrhosis ([place])   . Degenerative disk disease    knees  . Diverticulosis   . Dysrhythmia   . GERD (gastroesophageal reflux disease)   . Hemorrhoids   . History of kidney  stones   . Hypertension   . Hypothyroidism   . Obesity   . Paroxysmal atrial fibrillation ([facility])   . Partial small bowel obstruction ([place])   . Personal history of colonic polyps 03/25/2012   8 mm rectal adenoma 03/2012  . Portal hypertensive gastropathy ([place])   . Shingles   . Thyroid disease   . UTI (urinary tract infection) 12/13/2017  . Varices, esophageal ([place])   . Zoster     Medications:  Scheduled:  . [START ON 12/15/2017] levothyroxine  75 mcg Intravenous Daily    Assessment: 37 YOF admitted with PE. Currently on Eliquis for Afib/PE/DVT per MD note. Last dose of Eliquis 7/6 1900. Pharmacy consulted to dose heparin for PE. H/H/PLTs wnl.  Goal of Therapy:  Heparin level 0.3-0.7 units/ml aPTT 66-102 seconds Monitor platelets by anticoagulation protocol: Yes   Plan:  Decrease heparin infusion to 1,100 units/hr Check aPTT in 6 hours Continue to monitor H/H and platelets  Vertis Kelch, PharmD PGY1 Pharmacy Resident Phone [phone] 12/14/2017       9:06 AM

## 2017-12-14 NOTE — Telephone Encounter (Signed)
Called patient on 12/08/17 , 4:12 PM in an attempt to reach the patient for a hospital follow up.  Admit date: 12/04/17 Discharge date: 12/06/17.    LMOM ON 12/09/17

## 2017-12-14 NOTE — Progress Notes (Addendum)
PROGRESS NOTE    Brittney Valentine  XQJ:[id number] DOB: [birth year] DOA: 12/13/2017 PCP: Unk Pinto, MD  Brief Narrative:  Brittney Valentine is Brittney Valentine 77 y.o. female with history of cirrhosis secondary to Brittney Valentine, Brittney Valentine. fib, recently diagnosed pulmonary embolism and DVT of the left lower extremity, hypothyroidism, she has systolic CHF improved during last 2D echo done Brittney Valentine week ago presents to the ER with complaints of having feeling weak since discharge last week.  Patient also has benign persistent nausea vomiting over the last 24 hours unable to keep in anything.  Denies any abdominal pain diarrhea chest pain or shortness of breath.  Assessment & Plan:   Principal Problem:   Sepsis ([place]) Active Problems:   Hepatic cirrhosis (HCC)   Esophageal varices (HCC)   Atrial fibrillation with RVR (HCC)   Hypothyroidism   Obstructive sleep apnea   Acute lower UTI   DVT (deep venous thrombosis) (HCC)   PE (pulmonary thromboembolism) ([place])   1. Sepsis 2/2 Gram Positive Bacteremia  Possibly 2/2 UTI:   1. UA with may bacteria, RBC's, WBC's, many squams.  At this point, seems most likely source for bacteremia - unfortunately, no culture pending (will try to add on urine cx) 2. 2/2 blood cultures growing gram positive cocci in chains -> BCID is pending 3. Lactate downtrending.  S/p 2 L NS.  Pt with LE edema.  Follow repeat lactate.  Will add some maintenance IVF x 24 hrs, follow exam closely for signs of overload. 4. Will broaden from ceftriaxone to ceftriaxone/vanc.  Increase ceftriaxone to 2 grams.  Discussed with pharmacy.  Awaiting BCID. 5. CT renal study with chronic mid L ureteral calculus without hydronephrosis/hydroureter (also cirrhotic liver with ascites and splenomegaly).  Diffuse bowel wall thickening, present since 09/2017.  No abdominal pain on exam 6. SBP is on differential for her given cirrhosis/ascites, low suspicion given absence of abdominal pain.  Will hold off on diagnostic tap at this time  given lack of abdominal sx and likely UTI as source.  2. Brittney Valentine. fib with RVR - Heparin gtt, appreciate cards recs.   1. BP's still soft, will hold off on restarting metop right now -> BP's improved in afternoon, start metop 12.5 BID 2. Per cards, tolerate higher rates.  Avoid amiodarone.  Continuing anticoagulation.   3. Anemia with drop in hemoglobin from recent past.    1. FOBT + (of note, pt with hemorrhoid) 2. H/H stable today from yesterday, but has dropped about 2 points from last discharge. 3. Possible component of dilution, will trend H/H, c/s GI as needed.   4. Elevated INR 1. I think this is most likely 2/2 her cirrhosis, will give Brittney Valentine dose of vitamin K and follow 2. As coagulopathy likely 2/2 cirrhosis, will continue heparin for anticogulation  5. Recently diagnosed PE and DVT.   1. Heparin instead of eliquis while poorly able to tolerate PO  6. Nausea and vomiting: likely 2/2 #1.  Abdominal CT did have chronic diffuse bowel wall thickening, but possibly 2/2 portal enteropathy in setting of cirrhosis.    7. History of [place] Cirrhosis  1. Holding lasix and spironolactone 2. Hold lactulose for now, pt has watery stools per nursing  8. Acute Kidney Injury:  1. I/O's (pt incontinent, will place foley for now) 2. UA with 30 mg/dl protein, 21-50 RBC's in setting of suspected UTI 3. Holding lasix, spironolactone 4. MIVF x 24 hrs as noted above  9. Hypothyroidism on Synthroid.   1. Will dose  IV if patient cannot take orally. 2. Abnormal TSH, follow outpatient  # Hemorrhoids: anusol, may be cause of positive FOBT  # Diarrhea: likely 2/2 lactulose use.  Addendum -> nursing called with 3 watery BM, on further discussions, she hasn't had lactulose since last week.  Will check c diff.   DVT prophylaxis: heparin gtt Code Status: partial (no CPR, ok with intubation) Family Communication: none at bedside Disposition Plan: pending   Consultants:   cardiology  Procedures:    none  Antimicrobials:  Anti-infectives (From admission, onward)   Start     Dose/Rate Route Frequency Ordered Stop   12/15/17 1000  vancomycin (VANCOCIN) 1,250 mg in sodium chloride 0.9 % 250 mL IVPB     1,250 mg 166.7 mL/hr over 90 Minutes Intravenous Every 24 hours 12/14/17 0932     12/14/17 2200  cefTRIAXone (ROCEPHIN) 2 g in sodium chloride 0.9 % 100 mL IVPB     2 g 200 mL/hr over 30 Minutes Intravenous Every 24 hours 12/13/17 2104     12/14/17 1000  vancomycin (VANCOCIN) 1,500 mg in sodium chloride 0.9 % 500 mL IVPB     1,500 mg 250 mL/hr over 120 Minutes Intravenous  Once 12/14/17 0932     12/13/17 2145  cefTRIAXone (ROCEPHIN) 1 g in sodium chloride 0.9 % 100 mL IVPB     1 g 200 mL/hr over 30 Minutes Intravenous  Once 12/13/17 2131 12/14/17 0012   12/13/17 2100  cefTRIAXone (ROCEPHIN) 1 g in sodium chloride 0.9 % 100 mL IVPB  Status:  Discontinued     1 g 200 mL/hr over 30 Minutes Intravenous  Once 12/13/17 2051 12/13/17 2131      Subjective: Presented with n/v, low BP. Came from home. Feeling Brittney Valentine little better now.  Objective: Vitals:   12/13/17 2100 12/13/17 2128 12/14/17 0005 12/14/17 0436  BP:  98/68 93/74 (!) 102/49  Pulse:  (!) 138 (!) 129 (!) 129  Resp: (!) 22 (!) 21 (!) 24 18  Temp:  98.7 F (37.1 C) 97.9 F (36.6 C) (!) 97.4 F (36.3 C)  TempSrc:  Oral Oral Oral  SpO2:  95% 93% 95%  Weight:  99.6 kg (219 lb 9.3 oz)    Height:  5\' 2"  (1.575 m)      Intake/Output Summary (Last 24 hours) at 12/14/2017 0927 Last data filed at 12/13/2017 2300 Gross per 24 hour  Intake 1120.4 ml  Output 20 ml  Net 1100.4 ml   Filed Weights   12/13/17 1500 12/13/17 2128  Weight: 99.8 kg (220 lb) 99.6 kg (219 lb 9.3 oz)    Examination:  General exam: Appears calm and comfortable  Respiratory system: Clear to auscultation. Respiratory effort normal. Cardiovascular system: S1 & S2 heard, tachycardic, irregular. No murmurs, rubs, gallops.  Gastrointestinal system:  Abdomen is mildly distened, soft and nontender. No organomegaly or masses felt.  Central nervous system: Alert and oriented. No focal neurological deficits. Extremities: L>R LEE Skin: No rashes, lesions or ulcers Psychiatry: Judgement and insight appear normal. Mood & affect appropriate.     Data Reviewed: I have personally reviewed following labs and imaging studies  CBC: Recent Labs  Lab 12/13/17 1514 12/14/17 0338 12/14/17 0749  WBC 38.0* 23.7* 24.6*  NEUTROABS  --  20.1*  --   HGB 11.8* 10.7* 10.8*  HCT 35.8* 32.2* 32.0*  MCV 94.0 92.3 92.2  PLT 468* 379 767   Basic Metabolic Panel: Recent Labs  Lab 12/13/17 1514 12/14/17 0338  NA  138 139  K 5.0 4.8  CL 110 113*  CO2 20* 21*  GLUCOSE 82 105*  BUN 44* 50*  CREATININE 1.64* 1.63*  CALCIUM 9.7 9.3   GFR: Estimated Creatinine Clearance: 33.4 mL/min (Ensley Blas) (by C-G formula based on SCr of 1.63 mg/dL (H)). Liver Function Tests: Recent Labs  Lab 12/14/17 0338  AST 62*  ALT 32  ALKPHOS 59  BILITOT 1.9*  PROT 4.7*  ALBUMIN 1.8*   No results for input(s): LIPASE, AMYLASE in the last 168 hours. No results for input(s): AMMONIA in the last 168 hours. Coagulation Profile: Recent Labs  Lab 12/13/17 1514 12/13/17 2259  INR 3.50 4.13*   Cardiac Enzymes: Recent Labs  Lab 12/14/17 0338 12/14/17 0749  TROPONINI 0.11* 0.10*   BNP (last 3 results) No results for input(s): PROBNP in the last 8760 hours. HbA1C: No results for input(s): HGBA1C in the last 72 hours. CBG: Recent Labs  Lab 12/13/17 1501  GLUCAP 81   Lipid Profile: No results for input(s): CHOL, HDL, LDLCALC, TRIG, CHOLHDL, LDLDIRECT in the last 72 hours. Thyroid Function Tests: Recent Labs    12/14/17 0338  TSH 8.258*   Anemia Panel: No results for input(s): VITAMINB12, FOLATE, FERRITIN, TIBC, IRON, RETICCTPCT in the last 72 hours. Sepsis Labs: Recent Labs  Lab 12/13/17 1713 12/13/17 1938 12/13/17 2259 12/14/17 0338  LATICACIDVEN  3.26* 2.84* 2.8* 2.0*    Recent Results (from the past 240 hour(s))  MRSA PCR Screening     Status: None   Collection Time: 12/05/17  4:27 AM  Result Value Ref Range Status   MRSA by PCR NEGATIVE NEGATIVE Final    Comment:        The GeneXpert MRSA Assay (FDA approved for NASAL specimens only), is one component of Krissie Merrick comprehensive MRSA colonization surveillance program. It is not intended to diagnose MRSA infection nor to guide or monitor treatment for MRSA infections. Performed at [facility], [place] [address] [place], [place] [address]   Blood culture (routine x 2)     Status: None (Preliminary result)   Collection Time: 12/13/17  5:47 PM  Result Value Ref Range Status   Specimen Description BLOOD LEFT HAND  Final   Special Requests   Final    BOTTLES DRAWN AEROBIC AND ANAEROBIC Blood Culture adequate volume   Culture  Setup Time   Final    GRAM POSITIVE COCCI IN CHAINS ANAEROBIC BOTTLE ONLY Organism ID to follow Performed at [facility], [place] [address], [place], [place] [address]    Culture PENDING  Incomplete   Report Status PENDING  Incomplete  Blood culture (routine x 2)     Status: None (Preliminary result)   Collection Time: 12/13/17  5:47 PM  Result Value Ref Range Status   Specimen Description BLOOD LEFT HAND  Final   Special Requests   Final    BOTTLES DRAWN AEROBIC AND ANAEROBIC Blood Culture adequate volume   Culture  Setup Time   Final    GRAM POSITIVE COCCI IN CHAINS ANAEROBIC BOTTLE ONLY CRITICAL VALUE NOTED.  VALUE IS CONSISTENT WITH PREVIOUSLY REPORTED AND CALLED VALUE. Performed at [facility], [place] [address] [place], [place] [address]    Culture GRAM POSITIVE COCCI  Final   Report Status PENDING  Incomplete         Radiology Studies: Dg Chest 2 View  Result Date: 12/13/2017 CLINICAL DATA:  Hypotension today. EXAM: CHEST - 2 VIEW COMPARISON:  CT chest 12/04/2017.  The single view of the chest 04/14/2017. FINDINGS: There  small to moderate bilateral pleural effusions and basilar atelectasis. Cardiomegaly without edema is identified. No pneumothorax. Aortic atherosclerosis is seen. No acute bony abnormality. IMPRESSION: Small to moderate bilateral pleural effusions and basilar atelectasis, greater on the right. Cardiomegaly without edema. Atherosclerosis. Electronically Signed   By: Inge Rise M.D.   On: 12/13/2017 15:46   Ct Renal Stone Study  Result Date: 12/13/2017 CLINICAL DATA:  Nausea, vomiting, weakness, history cirrhosis, GERD, kidney stones, hypertension EXAM: CT ABDOMEN AND PELVIS WITHOUT CONTRAST TECHNIQUE: Multidetector CT imaging of the abdomen and pelvis was performed following the standard protocol without IV contrast. Sagittal and coronal MPR images reconstructed from axial data set. Oral contrast was not administered. COMPARISON:  09/09/2017 FINDINGS: Lower chest: Biatrial enlargement. Bibasilar pleural effusions moderate RIGHT and small LEFT. Bibasilar atelectasis. Hepatobiliary: Small cirrhotic liver. Post cholecystectomy. Low-attenuation lesion with peripheral calcification lateral segment LEFT lobe liver 2.0 x 1.9 cm not significantly changed. Pancreas: Atrophic pancreas without mass Spleen: Minimally enlarged, 15.8 x 12.9 x 6.8 cm (volume = 730 cm^3). No focal lesion. Adrenals/Urinary Tract: Unremarkable adrenal glands. Atrophic kidneys with minimal chronic nodularity. Persistent mid LEFT ureteral calculus 7 mm diameter. Bladder decompressed. No additional urinary tract calcification. Stomach/Bowel: Prior bowel resection with anastomotic staple line in RIGHT abdomen likely ileocecectomy. Mild diffuse colonic wall thickening versus artifact from underdistention. Additional staple line in the RIGHT upper quadrant at Tiffannie Sloss small bowel loop. This loop demonstrates diffuse bowel wall thickening and is immediately caudal to the transverse colon. Multiple additional small bowel loops appear dilated and demonstrate  diffuse wall thickening. Overall appearance is nonspecific. This could reflect diffuse enteritis from infection or inflammatory bowel disease, portal enteropathy related to cirrhosis, ischemia not excluded though this appears to be Gavriella Hearst chronic finding since the prior study making ischemia less likely. Vascular/Lymphatic: Scattered atherosclerotic calcifications aorta and iliac arteries. Aorta normal caliber. Reproductive: Uterus surgically absent. Nonvisualization of ovaries. Other: Significant ascites unchanged.  No free air.  No hernia. Musculoskeletal: Osseous demineralization. IMPRESSION: Cirrhotic liver with significant ascites and splenomegaly. Diffuse bowel wall thickening of large and small bowel loops throughout abdomen, present since 09/09/2017; differential diagnosis includes enteritis such as from infection or inflammatory bowel disease, portal enteropathy in the setting of cirrhosis, ischemia not excluded but considered less likely due to chronicity. Bibasilar effusions and atelectasis greater on RIGHT. Chronic mid LEFT ureteral calculus without hydronephrosis/hydroureter. Electronically Signed   By: Lavonia Dana M.D.   On: 12/13/2017 22:55        Scheduled Meds: . [START ON 12/15/2017] levothyroxine  75 mcg Intravenous Daily  . metoprolol succinate  25 mg Oral Daily   Continuous Infusions: . cefTRIAXone (ROCEPHIN)  IV    . heparin 1,200 Units/hr (12/14/17 0007)  . phytonadione (VITAMIN K) IV 1 mg (12/14/17 0904)     LOS: 1 day    Time spent: over 35 minutes of critical care time.  Pt critically ill with aki, afib with RVR, nash cirrhosis, bacteremia.    Fayrene Helper, MD [facility] Pager [phone]  If 7PM-7AM, please contact night-coverage [email or web address] Password [facility] 12/14/2017, 9:27 AM

## 2017-12-14 NOTE — Progress Notes (Addendum)
ANTICOAGULATION CONSULT NOTE - Follow-up Consult  Pharmacy Consult for heparin Indication: pulmonary embolus  Allergies  Allergen Reactions  . Amoxicillin Palpitations    Has patient had a PCN reaction causing immediate rash, facial/tongue/throat swelling, SOB or lightheadedness with hypotension: No Has patient had a PCN reaction causing severe rash involving mucus membranes or skin necrosis: No Has patient had a PCN reaction that required hospitalization: No Has patient had a PCN reaction occurring within the last 10 years: No If all of the above answers are "NO", then may proceed with Cephalosporin use.   . Ciprofloxacin Swelling  . Sulfonamide Derivatives Other (See Comments)    See little dots, skin feels like its burning  . Robaxin [Methocarbamol] Palpitations  . Verapamil Palpitations    Patient Measurements: Height: 5\' 2"  (157.5 cm) Weight: 219 lb 9.3 oz (99.6 kg) IBW/kg (Calculated) : 50.1 Heparin Dosing Weight: 73.7 kg  Vital Signs: Temp: 97.7 F (36.5 C) (07/08 0900) Temp Source: Oral (07/08 0900) BP: 108/72 (07/08 0900)  Labs: Recent Labs    12/13/17 1514 12/13/17 2259 12/14/17 0338 12/14/17 0720 12/14/17 0749 12/14/17 1634  HGB 11.8*  --  10.7*  --  10.8* 10.8*  HCT 35.8*  --  32.2*  --  32.0* 32.5*  PLT 468*  --  379  --  365  --   APTT  --  43*  --  106*  --  59*  LABPROT 34.9* 39.7*  --   --   --   --   INR 3.50 4.13*  --   --   --   --   HEPARINUNFRC  --   --   --  >2.20*  --   --   CREATININE 1.64*  --  1.63*  --   --  1.62*  TROPONINI  --   --  0.11*  --  0.10*  --     Estimated Creatinine Clearance: 33.6 mL/min (A) (by C-G formula based on SCr of 1.62 mg/dL (H)).   Medical History: Past Medical History:  Diagnosis Date  . Acute UTI   . Anemia   . Blood transfusion without reported diagnosis   . Cirrhosis ([place])   . Degenerative disk disease    knees  . Diverticulosis   . Dysrhythmia   . GERD (gastroesophageal reflux disease)   .  Hemorrhoids   . History of kidney stones   . Hypertension   . Hypothyroidism   . Obesity   . Paroxysmal atrial fibrillation (HCC)   . Partial small bowel obstruction ([place])   . Personal history of colonic polyps 03/25/2012   8 mm rectal adenoma 03/2012  . Portal hypertensive gastropathy ([place])   . Shingles   . Thyroid disease   . UTI (urinary tract infection) 12/13/2017  . Varices, esophageal ([place])   . Zoster     Medications:  Scheduled:  . folic acid  1 mg Oral Daily  . hydrocortisone  25 mg Rectal BID  . [START ON 12/15/2017] levothyroxine  150 mcg Oral QAC breakfast  . metoprolol tartrate  25 mg Oral BID    Assessment: 73 YOF admitted with PE. Currently on Eliquis for Afib/PE/DVT per MD note. Last dose of Eliquis 7/6 1900. Pharmacy consulted to dose heparin for PE. H/H/PLTs wnl. APTT below range at 59 sec.    Goal of Therapy:  Heparin level 0.3-0.7 units/ml aPTT 66-102 seconds Monitor platelets by anticoagulation protocol: Yes   Plan:  Heparin 1500 unit bolus Increase heparin to 1300  units/hr aPTT in 8 hours  Jade Burright A. Levada Dy, PharmD, [place] Pager: [phone] Please utilize Amion for appropriate phone number to reach the unit pharmacist ([place])    12/14/2017,5:58 PM

## 2017-12-14 NOTE — Progress Notes (Signed)
PHARMACY - PHYSICIAN COMMUNICATION CRITICAL VALUE ALERT - BLOOD CULTURE IDENTIFICATION (BCID)  Brittney Valentine is an 77 y.o. female who presented to [facility] on 12/13/2017 with a chief complaint of weakness following discharge from hospital last week for acute PE.  Assessment: Suspected sepsis upon admission to ED, possible urinary source 2/2 history of ureteral stent placement and recent removal.  Name of physician (or Provider) Contacted: Dr. Florene Glen  Current antibiotics: Vancomycin and ceftriaxone  Changes to prescribed antibiotics recommended: Discontinue vancomycin  Recommendations accepted by provider  Results for orders placed or performed during the hospital encounter of 12/13/17  Blood Culture ID Panel (Reflexed) (Collected: 12/13/2017  5:47 PM)  Result Value Ref Range   Enterococcus species NOT DETECTED NOT DETECTED   Listeria monocytogenes NOT DETECTED NOT DETECTED   Staphylococcus species NOT DETECTED NOT DETECTED   Staphylococcus aureus NOT DETECTED NOT DETECTED   Streptococcus species DETECTED (A) NOT DETECTED   Streptococcus agalactiae NOT DETECTED NOT DETECTED   Streptococcus pneumoniae NOT DETECTED NOT DETECTED   Streptococcus pyogenes NOT DETECTED NOT DETECTED   Acinetobacter baumannii NOT DETECTED NOT DETECTED   Enterobacteriaceae species NOT DETECTED NOT DETECTED   Enterobacter cloacae complex NOT DETECTED NOT DETECTED   Escherichia coli NOT DETECTED NOT DETECTED   Klebsiella oxytoca NOT DETECTED NOT DETECTED   Klebsiella pneumoniae NOT DETECTED NOT DETECTED   Proteus species NOT DETECTED NOT DETECTED   Serratia marcescens NOT DETECTED NOT DETECTED   Haemophilus influenzae NOT DETECTED NOT DETECTED   Neisseria meningitidis NOT DETECTED NOT DETECTED   Pseudomonas aeruginosa NOT DETECTED NOT DETECTED   Candida albicans NOT DETECTED NOT DETECTED   Candida glabrata NOT DETECTED NOT DETECTED   Candida krusei NOT DETECTED NOT DETECTED   Candida parapsilosis NOT  DETECTED NOT DETECTED   Candida tropicalis NOT DETECTED NOT DETECTED    Erin N. Gerarda Fraction, PharmD PGY2 Infectious Disease Pharmacy Resident Phone: [phone] 12/14/2017  10:50 AM

## 2017-12-14 NOTE — Progress Notes (Signed)
Text paged K. Schorr N.P that stool came back positive for C-Diff

## 2017-12-14 NOTE — Telephone Encounter (Signed)
Called patient on 12/14/2017 , 4:13 PM in an attempt to reach the patient for a hospital follow up.  Admit date: 12/04/17 Discharge date: 12/06/17.  TRIED TO CONTACT PT AGAIN & LOOKS LIKE SHE IS BACK IN THE HOSP AGAIN ON 12/14/17

## 2017-12-14 NOTE — Progress Notes (Addendum)
Pharmacy Antibiotic Note  Brittney Valentine is a 77 y.o. female admitted on 12/13/2017 with sepsis.  Pharmacy has been consulted for vancomycin dosing.  GPC chains in blood x 2  Plan: Vancomycin 1,500 mg IV x 1 followed by vancomycin 1,250 mg IV every 24 hours Monitor C/S, Scr, temp, CBC  Height: 5\' 2"  (157.5 cm) Weight: 219 lb 9.3 oz (99.6 kg) IBW/kg (Calculated) : 50.1  Temp (24hrs), Avg:98.1 F (36.7 C), Min:97.4 F (36.3 C), Max:98.7 F (37.1 C)  Recent Labs  Lab 12/13/17 1514 12/13/17 1713 12/13/17 1938 12/13/17 2259 12/14/17 0338 12/14/17 0749  WBC 38.0*  --   --   --  23.7* 24.6*  CREATININE 1.64*  --   --   --  1.63*  --   LATICACIDVEN  --  3.26* 2.84* 2.8* 2.0*  --     Estimated Creatinine Clearance: 33.4 mL/min (A) (by C-G formula based on SCr of 1.63 mg/dL (H)).    Allergies  Allergen Reactions  . Amoxicillin Palpitations    Has patient had a PCN reaction causing immediate rash, facial/tongue/throat swelling, SOB or lightheadedness with hypotension: No Has patient had a PCN reaction causing severe rash involving mucus membranes or skin necrosis: No Has patient had a PCN reaction that required hospitalization: No Has patient had a PCN reaction occurring within the last 10 years: No If all of the above answers are "NO", then may proceed with Cephalosporin use.   . Ciprofloxacin Swelling  . Sulfonamide Derivatives Other (See Comments)    See little dots, skin feels like its burning  . Robaxin [Methocarbamol] Palpitations  . Verapamil Palpitations    Thank you for allowing pharmacy to be a part of this patient's care.  Vertis Kelch, PharmD PGY1 Pharmacy Resident Phone [phone] 12/14/2017       9:30 AM   I discussed / reviewed the pharmacy note by Dr. Anabel Bene and I agree with the resident's findings and plans as documented. Thank you Anette Guarneri, PharmD [phone]

## 2017-12-15 DIAGNOSIS — R7881 Bacteremia: Secondary | ICD-10-CM

## 2017-12-15 DIAGNOSIS — R195 Other fecal abnormalities: Secondary | ICD-10-CM

## 2017-12-15 DIAGNOSIS — B955 Unspecified streptococcus as the cause of diseases classified elsewhere: Secondary | ICD-10-CM | POA: Diagnosis present

## 2017-12-15 DIAGNOSIS — A0472 Enterocolitis due to Clostridium difficile, not specified as recurrent: Secondary | ICD-10-CM | POA: Diagnosis present

## 2017-12-15 DIAGNOSIS — I82409 Acute embolism and thrombosis of unspecified deep veins of unspecified lower extremity: Secondary | ICD-10-CM

## 2017-12-15 DIAGNOSIS — I2699 Other pulmonary embolism without acute cor pulmonale: Secondary | ICD-10-CM

## 2017-12-15 DIAGNOSIS — D689 Coagulation defect, unspecified: Secondary | ICD-10-CM

## 2017-12-15 DIAGNOSIS — A09 Infectious gastroenteritis and colitis, unspecified: Secondary | ICD-10-CM

## 2017-12-15 LAB — PROTIME-INR
INR: 2.9
Prothrombin Time: 30.1 seconds — ABNORMAL HIGH (ref 11.4–15.2)

## 2017-12-15 LAB — APTT
aPTT: >200 seconds (ref 24–36)
aPTT: 46 seconds — ABNORMAL HIGH (ref 24–36)

## 2017-12-15 LAB — CBC
HCT: 32 % — ABNORMAL LOW (ref 36.0–46.0)
HEMOGLOBIN: 10.2 g/dL — AB (ref 12.0–15.0)
MCH: 30.9 pg (ref 26.0–34.0)
MCHC: 31.9 g/dL (ref 30.0–36.0)
MCV: 97 fL (ref 78.0–100.0)
Platelets: 375 10*3/uL (ref 150–400)
RBC: 3.3 MIL/uL — AB (ref 3.87–5.11)
RDW: 23.9 % — ABNORMAL HIGH (ref 11.5–15.5)
WBC: 18.2 10*3/uL — ABNORMAL HIGH (ref 4.0–10.5)

## 2017-12-15 LAB — HEPATIC FUNCTION PANEL
ALK PHOS: 65 U/L (ref 38–126)
ALT: 28 U/L (ref 0–44)
AST: 83 U/L — ABNORMAL HIGH (ref 15–41)
Albumin: 1.7 g/dL — ABNORMAL LOW (ref 3.5–5.0)
BILIRUBIN DIRECT: 0.6 mg/dL — AB (ref 0.0–0.2)
BILIRUBIN TOTAL: 1.7 mg/dL — AB (ref 0.3–1.2)
Indirect Bilirubin: 1.1 mg/dL — ABNORMAL HIGH (ref 0.3–0.9)
Total Protein: 4.4 g/dL — ABNORMAL LOW (ref 6.5–8.1)

## 2017-12-15 LAB — HEMOGLOBIN AND HEMATOCRIT, BLOOD
HCT: 31.2 % — ABNORMAL LOW (ref 36.0–46.0)
HEMATOCRIT: 31.7 % — AB (ref 36.0–46.0)
HEMOGLOBIN: 10.7 g/dL — AB (ref 12.0–15.0)
Hemoglobin: 10 g/dL — ABNORMAL LOW (ref 12.0–15.0)

## 2017-12-15 LAB — HEPARIN LEVEL (UNFRACTIONATED)
Heparin Unfractionated: >2.2 IU/mL — ABNORMAL HIGH (ref 0.30–0.70)
Heparin Unfractionated: 1.16 IU/mL — ABNORMAL HIGH (ref 0.30–0.70)

## 2017-12-15 LAB — POTASSIUM: POTASSIUM: 4.2 mmol/L (ref 3.5–5.1)

## 2017-12-15 LAB — GLUCOSE, CAPILLARY: GLUCOSE-CAPILLARY: 83 mg/dL (ref 70–99)

## 2017-12-15 LAB — MAGNESIUM: Magnesium: 2.1 mg/dL (ref 1.7–2.4)

## 2017-12-15 MED ORDER — SODIUM CHLORIDE 0.9 % IV SOLN
8.0000 mg/h | INTRAVENOUS | Status: DC
  Administered 2017-12-15 – 2017-12-17 (×5): 8 mg/h via INTRAVENOUS
  Filled 2017-12-15 (×8): qty 80

## 2017-12-15 MED ORDER — HEPARIN (PORCINE) IN NACL 100-0.45 UNIT/ML-% IJ SOLN
1100.0000 [IU]/h | INTRAMUSCULAR | Status: DC
  Administered 2017-12-15: 1150 [IU]/h via INTRAVENOUS
  Filled 2017-12-15: qty 250

## 2017-12-15 MED ORDER — HEPARIN (PORCINE) IN NACL 100-0.45 UNIT/ML-% IJ SOLN: 1150.0000 [IU]/h | INTRAMUSCULAR | Status: DC

## 2017-12-15 MED ORDER — SODIUM CHLORIDE 0.9 % IV SOLN
80.0000 mg | Freq: Once | INTRAVENOUS | Status: AC
  Administered 2017-12-15: 80 mg via INTRAVENOUS
  Filled 2017-12-15: qty 80

## 2017-12-15 MED ORDER — VITAMIN K1 10 MG/ML IJ SOLN
1.0000 mg | Freq: Once | INTRAMUSCULAR | Status: AC
  Administered 2017-12-15: 1 mg via INTRAVENOUS
  Filled 2017-12-15: qty 0.5

## 2017-12-15 NOTE — Progress Notes (Addendum)
PROGRESS NOTE    Brittney Valentine  ENI:[id number] DOB: 1940-11-28 DOA: 12/13/2017 PCP: Unk Pinto, MD  Brief Narrative:  Brittney Valentine is Brittney Valentine 77 y.o. female with history of cirrhosis secondary to Brittney Valentine, Brittney Valentine. fib, recently diagnosed pulmonary embolism and DVT of the left lower extremity, hypothyroidism, she has systolic CHF improved during last 2D echo done Brittney Valentine Six week ago presents to the ER with complaints of having feeling weak since discharge last week.  Patient also has benign persistent nausea vomiting over the last 24 hours unable to keep in anything.  Denies any abdominal pain diarrhea chest pain or shortness of breath.  Assessment & Plan:   Principal Problem:   Sepsis ([place]) Active Problems:   Hepatic cirrhosis (HCC)   Esophageal varices (HCC)   Atrial fibrillation with RVR (HCC)   Hypothyroidism   Obstructive sleep apnea   Acute lower UTI   DVT (deep venous thrombosis) (HCC)   PE (pulmonary thromboembolism) ([place])   1. Sepsis 2/2 Gram Positive Bacteremia  Possibly 2/2 UTI:   1. UA with may bacteria, RBC's, WBC's, many squams.  At this point, seems most likely source for bacteremia - urine cx pending 2. 2/2 blood cultures growing gram positive cocci in chains -> BCID with strep 3. Lactate normalized.  S/p 2 L NS and IVF.  Pt with LE edema, will continue IVF for now with improved UOP and BP's and continued diarrhea, but will likely need lasix. 4. Vanc x 1 dose 7/8 5. Ceftriaxone 7/7 - present 6. Leukocytosis improving 7. CT renal study with chronic mid L ureteral calculus without hydronephrosis/hydroureter (also cirrhotic liver with ascites and splenomegaly).  Diffuse bowel wall thickening, present since 09/2017.  No abdominal pain on exam 8. SBP is on differential for her given cirrhosis/ascites, low suspicion given absence of abdominal pain.  Will hold off on diagnostic tap at this time given lack of abdominal sx and likely UTI as source.  Addendum:  Urine cx returned with  klebsiella, but blood cx with strep.  Discussed above with ID on call, who noted if no abdominal sx, unlikely peritonitis, likely unnecessary to tap at this time.  Will hold off for now.  No need to repeat echo, unless concerning sx for endocarditis.  Discussed 7-10 days of abx.      2. C diff Colitis:  1. Vanc PO, given abx use above, will likely need to treat additional 7 days after completion of abx course for above  3. Brittney Valentine. fib with RVR - Heparin gtt, appreciate cards recs.   1. BP's and HR improved 2. Metop increased to 25 mg BID.  Avoid amiodarone.  Continuing anticoagulation.   4. Anemia with drop in hemoglobin from recent past.    1. FOBT +  2. Nursing notes stools black in color 3. Iron def anemia, folate deficiency 4. H/H relatively stable, but in setting of black stools, positive hemoccult, cirrhosis, and need for anticoagulation, will c/s GI  5. Elevated INR 1. Improved with vitamin K, repeat 2. As coagulopathy likely 2/2 cirrhosis, will continue heparin for anticoagulation.  Will c/s heme for formal recommendations regarding anticoagulation in setting of coagulopathy with cirrhosis.    6. Recently diagnosed PE and DVT.   1. Heparin instead of eliquis while poorly able to tolerate PO  7. Nausea and vomiting: likely 2/2 #1.  Abdominal CT did have chronic diffuse bowel wall thickening, but possibly 2/2 portal enteropathy in setting of cirrhosis.    8. History of Brittney Valentine Cirrhosis  1. Holding lasix and spironolactone 2. Hold lactulose for now, pt has watery stools per nursing  9. Acute Kidney Injury:  Improving 1. I/O's (initially with low UOP after foley placement yesterday, but now improved, continue IVF for now) 2. UA with 30 mg/dl protein, 21-50 RBC's in setting of suspected UTI 3. Holding lasix, spironolactone 4. MIVF x 24 hrs as noted above  10. Hypothyroidism on Synthroid.   1. Synthroid 2. Abnormal TSH, follow outpatient in setting of acute infection  # Hemorrhoids:  acute diarrhea is likely cause of worsened sx in setting of hemorrhoids  # Hyperkalemia: follow repeat K  DVT prophylaxis: heparin gtt Code Status: partial (no CPR, ok with intubation) Family Communication: none at bedside Disposition Plan: pending   Consultants:   cardiology  Procedures:   none  Antimicrobials:  Anti-infectives (From admission, onward)   Start     Dose/Rate Route Frequency Ordered Stop   12/15/17 1000  vancomycin (VANCOCIN) 1,250 mg in sodium chloride 0.9 % 250 mL IVPB  Status:  Discontinued     1,250 mg 166.7 mL/hr over 90 Minutes Intravenous Every 24 hours 12/14/17 0932 12/14/17 1050   12/14/17 2200  cefTRIAXone (ROCEPHIN) 2 g in sodium chloride 0.9 % 100 mL IVPB     2 g 200 mL/hr over 30 Minutes Intravenous Every 24 hours 12/13/17 2104     12/14/17 2200  vancomycin (VANCOCIN) 50 mg/mL oral solution 125 mg     125 mg Oral 4 times daily 12/14/17 2013 12/24/17 2159   12/14/17 1000  vancomycin (VANCOCIN) 1,500 mg in sodium chloride 0.9 % 500 mL IVPB  Status:  Discontinued     1,500 mg 250 mL/hr over 120 Minutes Intravenous  Once 12/14/17 0932 12/14/17 1320   12/13/17 2145  cefTRIAXone (ROCEPHIN) 1 g in sodium chloride 0.9 % 100 mL IVPB     1 g 200 mL/hr over 30 Minutes Intravenous  Once 12/13/17 2131 12/14/17 0012   12/13/17 2100  cefTRIAXone (ROCEPHIN) 1 g in sodium chloride 0.9 % 100 mL IVPB  Status:  Discontinued     1 g 200 mL/hr over 30 Minutes Intravenous  Once 12/13/17 2051 12/13/17 2131      Subjective: Embarrassed due to diarrhea. No abdominal pain.   Feels weak. Can't say how much diarrhea she's having, persistent.  Objective: Vitals:   12/15/17 0400 12/15/17 0511 12/15/17 0600 12/15/17 0811  BP: (!) 111/57   (!) 104/52  Pulse: 100   (!) 106  Resp: 20   19  Temp: 97.6 F (36.4 C) 97.6 F (36.4 C)  97.6 F (36.4 C)  TempSrc: Oral Oral  Oral  SpO2:    95%  Weight:   102.5 kg (225 lb 15.5 oz)   Height:        Intake/Output Summary  (Last 24 hours) at 12/15/2017 0926 Last data filed at 12/15/2017 0640 Gross per 24 hour  Intake 3367.68 ml  Output 670 ml  Net 2697.68 ml   Filed Weights   12/13/17 1500 12/13/17 2128 12/15/17 0600  Weight: 99.8 kg (220 lb) 99.6 kg (219 lb 9.3 oz) 102.5 kg (225 lb 15.5 oz)    Examination:  General: No acute distress. Cardiovascular: Heart sounds show an irregular rate and rhythm.  No murmur appreciated. Lungs: Clear to auscultation bilaterally. No rales, rhonchi or wheezes. Abdomen: Soft, nontender, nondistended Neurological: Alert and oriented 3. Moves all extremities 4. Cranial nerves II through XII grossly intact. Skin: Warm and dry. No rashes or lesions.  No appreciated splinter hemorrhages, janeway lesions, osler nodes.  Bruising over L toes, she notes she stubbed her toes. Extremities: No clubbing or cyanosis. L>R LEE Psychiatric: Mood and affect are normal. Insight and judgment are appropriate.   Data Reviewed: I have personally reviewed following labs and imaging studies  CBC: Recent Labs  Lab 12/13/17 1514 12/14/17 0338 12/14/17 0749 12/14/17 1634 12/15/17 0333  WBC 38.0* 23.7* 24.6*  --  18.2*  NEUTROABS  --  20.1*  --   --   --   HGB 11.8* 10.7* 10.8* 10.8* 10.2*  HCT 35.8* 32.2* 32.0* 32.5* 32.0*  MCV 94.0 92.3 92.2  --  97.0  PLT 468* 379 365  --  998   Basic Metabolic Panel: Recent Labs  Lab 12/13/17 1514 12/14/17 0338 12/14/17 1634 12/15/17 0333  NA 138 139 136 135  K 5.0 4.8 4.3 5.5*  CL 110 113* 107 111  CO2 20* 21* 20* 23  GLUCOSE 82 105* 112* 81  BUN 44* 50* 53* 51*  CREATININE 1.64* 1.63* 1.62* 1.50*  CALCIUM 9.7 9.3 9.2 8.8*  MG  --   --   --  2.1   GFR: Estimated Creatinine Clearance: 36.9 mL/min (Brittney Valentine) (by C-G formula based on SCr of 1.5 mg/dL (H)). Liver Function Tests: Recent Labs  Lab 12/14/17 0338 12/15/17 0333  AST 62* 83*  ALT 32 28  ALKPHOS 59 65  BILITOT 1.9* 1.7*  PROT 4.7* 4.4*  ALBUMIN 1.8* 1.7*   No results for  input(s): LIPASE, AMYLASE in the last 168 hours. No results for input(s): AMMONIA in the last 168 hours. Coagulation Profile: Recent Labs  Lab 12/13/17 1514 12/13/17 2259 12/15/17 0333  INR 3.50 4.13* 2.90   Cardiac Enzymes: Recent Labs  Lab 12/14/17 0338 12/14/17 0749  TROPONINI 0.11* 0.10*   BNP (last 3 results) No results for input(s): PROBNP in the last 8760 hours. HbA1C: No results for input(s): HGBA1C in the last 72 hours. CBG: Recent Labs  Lab 12/13/17 1501  GLUCAP 81   Lipid Profile: No results for input(s): CHOL, HDL, LDLCALC, TRIG, CHOLHDL, LDLDIRECT in the last 72 hours. Thyroid Function Tests: Recent Labs    12/14/17 0338  TSH 8.258*   Anemia Panel: Recent Labs    12/14/17 1023 12/14/17 1038  VITAMINB12  --  1,019*  FOLATE 4.9*  --   FERRITIN 212  --   TIBC 193*  --   IRON 18*  --    Sepsis Labs: Recent Labs  Lab 12/14/17 0338 12/14/17 1023 12/14/17 1233 12/14/17 1634  LATICACIDVEN 2.0* 2.4* 2.3* 1.9    Recent Results (from the past 240 hour(s))  Blood culture (routine x 2)     Status: None (Preliminary result)   Collection Time: 12/13/17  5:47 PM  Result Value Ref Range Status   Specimen Description BLOOD LEFT HAND  Final   Special Requests   Final    BOTTLES DRAWN AEROBIC AND ANAEROBIC Blood Culture adequate volume   Culture  Setup Time   Final    GRAM POSITIVE COCCI IN CHAINS IN BOTH AEROBIC AND ANAEROBIC BOTTLES Organism ID to follow CRITICAL RESULT CALLED TO, READ BACK BY AND VERIFIED WITH: Asencion Islam PharmD 10:25 12/14/17 ([email or web address]) Performed at [facility], [place] [address] [place], [place] [address]    Culture GRAM POSITIVE COCCI IN CHAINS  Final   Report Status PENDING  Incomplete  Blood culture (routine x 2)     Status: None (Preliminary result)  Collection Time: 12/13/17  5:47 PM  Result Value Ref Range Status   Specimen Description BLOOD LEFT HAND  Final   Special Requests   Final    BOTTLES DRAWN AEROBIC AND  ANAEROBIC Blood Culture adequate volume   Culture  Setup Time   Final    GRAM POSITIVE COCCI IN CHAINS IN BOTH AEROBIC AND ANAEROBIC BOTTLES CRITICAL VALUE NOTED.  VALUE IS CONSISTENT WITH PREVIOUSLY REPORTED AND CALLED VALUE. Performed at [facility], [place] [address], [place], [place] [address]    Culture GRAM POSITIVE COCCI  Final   Report Status PENDING  Incomplete  Blood Culture ID Panel (Reflexed)     Status: Abnormal   Collection Time: 12/13/17  5:47 PM  Result Value Ref Range Status   Enterococcus species NOT DETECTED NOT DETECTED Final   Listeria monocytogenes NOT DETECTED NOT DETECTED Final   Staphylococcus species NOT DETECTED NOT DETECTED Final   Staphylococcus aureus NOT DETECTED NOT DETECTED Final   Streptococcus species DETECTED (Nerine Pulse) NOT DETECTED Final    Comment: Not Enterococcus species, Streptococcus agalactiae, Streptococcus pyogenes, or Streptococcus pneumoniae. CRITICAL RESULT CALLED TO, READ BACK BY AND VERIFIED WITH: Asencion Islam PharmD 10:25 12/14/17 ([email or web address])    Streptococcus agalactiae NOT DETECTED NOT DETECTED Final   Streptococcus pneumoniae NOT DETECTED NOT DETECTED Final   Streptococcus pyogenes NOT DETECTED NOT DETECTED Final   Acinetobacter baumannii NOT DETECTED NOT DETECTED Final   Enterobacteriaceae species NOT DETECTED NOT DETECTED Final   Enterobacter cloacae complex NOT DETECTED NOT DETECTED Final   Escherichia coli NOT DETECTED NOT DETECTED Final   Klebsiella oxytoca NOT DETECTED NOT DETECTED Final   Klebsiella pneumoniae NOT DETECTED NOT DETECTED Final   Proteus species NOT DETECTED NOT DETECTED Final   Serratia marcescens NOT DETECTED NOT DETECTED Final   Haemophilus influenzae NOT DETECTED NOT DETECTED Final   Neisseria meningitidis NOT DETECTED NOT DETECTED Final   Pseudomonas aeruginosa NOT DETECTED NOT DETECTED Final   Candida albicans NOT DETECTED NOT DETECTED Final   Candida glabrata NOT DETECTED NOT DETECTED Final   Candida krusei  NOT DETECTED NOT DETECTED Final   Candida parapsilosis NOT DETECTED NOT DETECTED Final   Candida tropicalis NOT DETECTED NOT DETECTED Final    Comment: Performed at [facility], [place] [address] [place], [place] [address]  C difficile quick scan w PCR reflex     Status: Abnormal   Collection Time: 12/14/17  6:43 PM  Result Value Ref Range Status   C Diff antigen POSITIVE (Dasani Crear) NEGATIVE Final   C Diff toxin POSITIVE (Jadis Mika) NEGATIVE Final   C Diff interpretation Toxin producing C. difficile detected.  Final    Comment: CRITICAL RESULT CALLED TO, READ BACK BY AND VERIFIED WITH: Miguel Dibble RN 2006 12/14/17 Brittney Valentine Performed at [facility], [place] [address], [place], [place] [address]          Radiology Studies: Dg Chest 2 View  Result Date: 12/13/2017 CLINICAL DATA:  Hypotension today. EXAM: CHEST - 2 VIEW COMPARISON:  CT chest 12/04/2017. The single view of the chest 04/14/2017. FINDINGS: There small to moderate bilateral pleural effusions and basilar atelectasis. Cardiomegaly without edema is identified. No pneumothorax. Aortic atherosclerosis is seen. No acute bony abnormality. IMPRESSION: Small to moderate bilateral pleural effusions and basilar atelectasis, greater on the right. Cardiomegaly without edema. Atherosclerosis. Electronically Signed   By: Inge Rise M.D.   On: 12/13/2017 15:46   Ct Renal Stone Study  Result Date: 12/13/2017 CLINICAL  DATA:  Nausea, vomiting, weakness, history cirrhosis, GERD, kidney stones, hypertension EXAM: CT ABDOMEN AND PELVIS WITHOUT CONTRAST TECHNIQUE: Multidetector CT imaging of the abdomen and pelvis was performed following the standard protocol without IV contrast. Sagittal and coronal MPR images reconstructed from axial data set. Oral contrast was not administered. COMPARISON:  09/09/2017 FINDINGS: Lower chest: Biatrial enlargement. Bibasilar pleural effusions moderate RIGHT and small LEFT. Bibasilar atelectasis. Hepatobiliary: Small  cirrhotic liver. Post cholecystectomy. Low-attenuation lesion with peripheral calcification lateral segment LEFT lobe liver 2.0 x 1.9 cm not significantly changed. Pancreas: Atrophic pancreas without mass Spleen: Minimally enlarged, 15.8 x 12.9 x 6.8 cm (volume = 730 cm^3). No focal lesion. Adrenals/Urinary Tract: Unremarkable adrenal glands. Atrophic kidneys with minimal chronic nodularity. Persistent mid LEFT ureteral calculus 7 mm diameter. Bladder decompressed. No additional urinary tract calcification. Stomach/Bowel: Prior bowel resection with anastomotic staple line in RIGHT abdomen likely ileocecectomy. Mild diffuse colonic wall thickening versus artifact from underdistention. Additional staple line in the RIGHT upper quadrant at Rea Reser small bowel loop. This loop demonstrates diffuse bowel wall thickening and is immediately caudal to the transverse colon. Multiple additional small bowel loops appear dilated and demonstrate diffuse wall thickening. Overall appearance is nonspecific. This could reflect diffuse enteritis from infection or inflammatory bowel disease, portal enteropathy related to cirrhosis, ischemia not excluded though this appears to be Corlene Sabia chronic finding since the prior study making ischemia less likely. Vascular/Lymphatic: Scattered atherosclerotic calcifications aorta and iliac arteries. Aorta normal caliber. Reproductive: Uterus surgically absent. Nonvisualization of ovaries. Other: Significant ascites unchanged.  No free air.  No hernia. Musculoskeletal: Osseous demineralization. IMPRESSION: Cirrhotic liver with significant ascites and splenomegaly. Diffuse bowel wall thickening of large and small bowel loops throughout abdomen, present since 09/09/2017; differential diagnosis includes enteritis such as from infection or inflammatory bowel disease, portal enteropathy in the setting of cirrhosis, ischemia not excluded but considered less likely due to chronicity. Bibasilar effusions and  atelectasis greater on RIGHT. Chronic mid LEFT ureteral calculus without hydronephrosis/hydroureter. Electronically Signed   By: Lavonia Dana M.D.   On: 12/13/2017 22:55        Scheduled Meds: . folic acid  1 mg Oral Daily  . hydrocortisone  25 mg Rectal BID  . levothyroxine  150 mcg Oral QAC breakfast  . metoprolol tartrate  25 mg Oral BID  . vancomycin  125 mg Oral QID   Continuous Infusions: . sodium chloride 100 mL/hr at 12/15/17 0909  . cefTRIAXone (ROCEPHIN)  IV Stopped (12/14/17 2234)  . heparin 1,150 Units/hr (12/15/17 3382)  . phytonadione (VITAMIN K) IV Stopped (12/14/17 1003)     LOS: 2 days    Time spent: over 33 min   Fayrene Helper, MD [facility] Pager [phone]  If 7PM-7AM, please contact night-coverage [email or web address] Password [email or web address] 12/15/2017, 9:26 AM

## 2017-12-15 NOTE — Progress Notes (Signed)
ANTICOAGULATION CONSULT NOTE - Follow-up Consult  Pharmacy Consult for Heparin Indication: pulmonary embolus  Allergies  Allergen Reactions  . Amoxicillin Palpitations    Has patient had a PCN reaction causing immediate rash, facial/tongue/throat swelling, SOB or lightheadedness with hypotension: No Has patient had a PCN reaction causing severe rash involving mucus membranes or skin necrosis: No Has patient had a PCN reaction that required hospitalization: No Has patient had a PCN reaction occurring within the last 10 years: No If all of the above answers are "NO", then may proceed with Cephalosporin use.   . Ciprofloxacin Swelling  . Sulfonamide Derivatives Other (See Comments)    See little dots, skin feels like its burning  . Robaxin [Methocarbamol] Palpitations  . Verapamil Palpitations   Patient Measurements: Height: 5\' 2"  (157.5 cm) Weight: 225 lb 15.5 oz (102.5 kg) IBW/kg (Calculated) : 50.1 Heparin Dosing Weight: 73.7 kg  Vital Signs: Temp: 98 F (36.7 C) (07/09 1319) Temp Source: Oral (07/09 1319) BP: 107/70 (07/09 1319) Pulse Rate: 97 (07/09 1319)  Labs: Recent Labs    12/13/17 1514  12/13/17 2259 12/14/17 0338 12/14/17 0720 12/14/17 0749 12/14/17 1634 12/15/17 0333 12/15/17 1404 12/15/17 1525  HGB 11.8*  --   --  10.7*  --  10.8* 10.8* 10.2* 10.0*  --   HCT 35.8*  --   --  32.2*  --  32.0* 32.5* 32.0* 31.2*  --   PLT 468*  --   --  379  --  365  --  375  --   --   APTT  --    < > 43*  --  106*  --  59* >200*  --  46*  LABPROT 34.9*  --  39.7*  --   --   --   --  30.1*  --   --   INR 3.50  --  4.13*  --   --   --   --  2.90  --   --   HEPARINUNFRC  --   --   --   --  >2.20*  --   --  >2.20*  --  1.16*  CREATININE 1.64*  --   --  1.63*  --   --  1.62* 1.50*  --   --   TROPONINI  --   --   --  0.11*  --  0.10*  --   --   --   --    < > = values in this interval not displayed.    Estimated Creatinine Clearance: 36.9 mL/min (A) (by C-G formula based on  SCr of 1.5 mg/dL (H)).   Medical History: Past Medical History:  Diagnosis Date  . Acute UTI   . Anemia   . Blood transfusion without reported diagnosis   . Cirrhosis ([place])   . Degenerative disk disease    knees  . Diverticulosis   . Dysrhythmia   . GERD (gastroesophageal reflux disease)   . Hemorrhoids   . History of kidney stones   . Hypertension   . Hypothyroidism   . Obesity   . Paroxysmal atrial fibrillation (HCC)   . Partial small bowel obstruction ([place])   . Personal history of colonic polyps 03/25/2012   8 mm rectal adenoma 03/2012  . Portal hypertensive gastropathy ([place])   . Shingles   . Thyroid disease   . UTI (urinary tract infection) 12/13/2017  . Varices, esophageal ([place])   . Zoster     Medications:  Scheduled:  .  folic acid  1 mg Oral Daily  . levothyroxine  150 mcg Oral QAC breakfast  . metoprolol tartrate  25 mg Oral BID  . vancomycin  125 mg Oral QID    Assessment: Brittney Valentine admitted with PE. Currently on Eliquis for Afib/PE/DVT per MD note. Last dose of Eliquis 7/6 1900. Pharmacy consulted to dose heparin for PE. H/H/PLTs wnl. APTT below range at 59 sec.   7/9 pm update: aPTT now low, heparin level still elevated due to recent apixaban use. FOBT + with black stools, MD favors continuing anticoagulation for now. GI has been consulted. INR elevated due to liver disease, vitamin k given this afternoon. Will not bolus given possible ongoing bleeding     Goal of Therapy:  Heparin level 0.3-0.7 units/ml aPTT 66-102 seconds Monitor platelets by anticoagulation protocol: Yes   Plan:  -Increase heparin to 1250/hr -Recheck labs in am -Stop time entered for endoscopy tomorrow  Erin Hearing PharmD., BCPS Clinical Pharmacist 12/15/2017 5:03 PM

## 2017-12-15 NOTE — Progress Notes (Signed)
ANTICOAGULATION CONSULT NOTE - Follow-up Consult  Pharmacy Consult for Heparin Indication: pulmonary embolus  Allergies  Allergen Reactions  . Amoxicillin Palpitations    Has patient had a PCN reaction causing immediate rash, facial/tongue/throat swelling, SOB or lightheadedness with hypotension: No Has patient had a PCN reaction causing severe rash involving mucus membranes or skin necrosis: No Has patient had a PCN reaction that required hospitalization: No Has patient had a PCN reaction occurring within the last 10 years: No If all of the above answers are "NO", then may proceed with Cephalosporin use.   . Ciprofloxacin Swelling  . Sulfonamide Derivatives Other (See Comments)    See little dots, skin feels like its burning  . Robaxin [Methocarbamol] Palpitations  . Verapamil Palpitations   Patient Measurements: Height: 5\' 2"  (157.5 cm) Weight: 219 lb 9.3 oz (99.6 kg) IBW/kg (Calculated) : 50.1 Heparin Dosing Weight: 73.7 kg  Vital Signs: Temp: 97.6 F (36.4 C) (07/09 0511) Temp Source: Oral (07/09 0511) BP: 111/51 (07/09 0347) Pulse Rate: 103 (07/09 0347)  Labs: Recent Labs    12/13/17 1514  12/13/17 2259 12/14/17 6269 12/14/17 0720 12/14/17 0749 12/14/17 1634 12/15/17 0333  HGB 11.8*  --   --  10.7*  --  10.8* 10.8* 10.2*  HCT 35.8*  --   --  32.2*  --  32.0* 32.5* 32.0*  PLT 468*  --   --  379  --  365  --  375  APTT  --    < > 43*  --  106*  --  59* >200*  LABPROT 34.9*  --  39.7*  --   --   --   --  30.1*  INR 3.50  --  4.13*  --   --   --   --  2.90  HEPARINUNFRC  --   --   --   --  >2.20*  --   --  >2.20*  CREATININE 1.64*  --   --  1.63*  --   --  1.62* 1.50*  TROPONINI  --   --   --  0.11*  --  0.10*  --   --    < > = values in this interval not displayed.    Estimated Creatinine Clearance: 36.3 mL/min (A) (by C-G formula based on SCr of 1.5 mg/dL (H)).   Medical History: Past Medical History:  Diagnosis Date  . Acute UTI   . Anemia   . Blood  transfusion without reported diagnosis   . Cirrhosis ([place])   . Degenerative disk disease    knees  . Diverticulosis   . Dysrhythmia   . GERD (gastroesophageal reflux disease)   . Hemorrhoids   . History of kidney stones   . Hypertension   . Hypothyroidism   . Obesity   . Paroxysmal atrial fibrillation (HCC)   . Partial small bowel obstruction ([place])   . Personal history of colonic polyps 03/25/2012   8 mm rectal adenoma 03/2012  . Portal hypertensive gastropathy ([place])   . Shingles   . Thyroid disease   . UTI (urinary tract infection) 12/13/2017  . Varices, esophageal ([place])   . Zoster     Medications:  Scheduled:  . folic acid  1 mg Oral Daily  . hydrocortisone  25 mg Rectal BID  . levothyroxine  150 mcg Oral QAC breakfast  . metoprolol tartrate  25 mg Oral BID  . vancomycin  125 mg Oral QID    Assessment: 42 YOF admitted with  PE. Currently on Eliquis for Afib/PE/DVT per MD note. Last dose of Eliquis 7/6 1900. Pharmacy consulted to dose heparin for PE. H/H/PLTs wnl. APTT below range at 59 sec.   7/9 AM update: aPTT is supra-therapeutic, confirmed with RN/pt that lab was drawn from the arm opposite of the heparin infusion    Goal of Therapy:  Heparin level 0.3-0.7 units/ml aPTT 66-102 seconds Monitor platelets by anticoagulation protocol: Yes   Plan:  -Hold heparin x 1 hr -Re-start heparin at 1150 units/hr at 0600 -1400 heparin level/aPTT  Narda Bonds, PharmD, BCPS Clinical Pharmacist Phone: [phone]

## 2017-12-15 NOTE — Progress Notes (Signed)
Heme/Onc consult received.  Case and recommendations discussed with Dr Florene Glen. Patient will be seen later today.  Sullivan Lone MD MS

## 2017-12-15 NOTE — Progress Notes (Signed)
HEMATOLOGY/ONCOLOGY CONSULTATION NOTE  Date of Service: 12/15/2017  Patient Care Team: Unk Pinto, MD as PCP - General (Internal Medicine) Minus Breeding, MD as PCP - Cardiology (Cardiology) Rutherford Guys, MD as Consulting Physician (Ophthalmology) Gatha Mayer, MD as Consulting Physician (Gastroenterology) Deterding, Jeneen Rinks, MD as Consulting Physician (Nephrology) Jettie Booze, MD as Consulting Physician (Cardiology)  CHIEF COMPLAINTS/PURPOSE OF CONSULTATION:  Recommendation regarding anticoagulation for recent PE/DVT in the setting of liver cirrhosis with elevated PT  HISTORY OF PRESENTING ILLNESS:   Brittney Valentine is a wonderful 77 y.o. female who has been referred to [place] by Dr Florene Glen for Recommendation regarding anticoagulation for recent PE/DVT in the setting of liver cirrhosis with elevated PT. There was no family members at bedside during clinical visit.  Patient has a h/o liver cirrhosis due to NASH, afib, hypothyroidism. Systolic CHF and with recently diagnosed with DVT and pulmonary embolism.  CTA chest on 12/04/2017: Changes consistent with bilateral pulmonary emboli right greater than left with associated changes in the lower lobes bilaterally as well as bilateral pleural effusions right considerably greater than left. No right heart strain is noted. Patient   [place] ext 12/04/2017: Right: No evidence of common femoral vein obstruction. Left: There is evidence of acute DVT in the Femoral vein, Popliteal vein, Peroneal veins, and Gastrocnemius vein. There is evidence of acute superficial thrombosis of the small saphenous vein. No cystic structure found in the popliteal fossa.  Patient was started on Eliquis for VTE and discharged.  She has been readmitted with sepsis ? Related to urinary source and clostridium colitis on PO vancomycin  Most recent lab results (12/15/17) of CBC  is as follows: all values are WNL except for WBC at 18.2k, RBC at 3.30, HGB at 10.2,  HCT at 32.0, RDW at 23.9. PT/INR was elevated at 39.1/4.13 likely from cirrhosis + vit K deficiency (in setting of Abx and c diff diarrhea). Improved to 30.1/2.9 with a dose of vit K.  Patient has been started on IV heparin for the VTE and Eliquis currently on hold.  GI is following for heme positive stools with dark stools.  I discussed this findings with the patient and had a discussion regarding anticoagulation considerations.  MEDICAL HISTORY:  Past Medical History:  Diagnosis Date  . Acute UTI   . Anemia   . Blood transfusion without reported diagnosis   . Cirrhosis ([place])   . Degenerative disk disease    knees  . Diverticulosis   . Dysrhythmia   . GERD (gastroesophageal reflux disease)   . Hemorrhoids   . History of kidney stones   . Hypertension   . Hypothyroidism   . Obesity   . Paroxysmal atrial fibrillation (HCC)   . Partial small bowel obstruction ([place])   . Personal history of colonic polyps 03/25/2012   8 mm rectal adenoma 03/2012  . Portal hypertensive gastropathy ([place])   . Shingles   . Thyroid disease   . UTI (urinary tract infection) 12/13/2017  . Varices, esophageal ([place])   . Zoster     SURGICAL HISTORY: Past Surgical History:  Procedure Laterality Date  . ABDOMINAL HYSTERECTOMY    . APPENDECTOMY  1991  . CHOLECYSTECTOMY  2000  . COLON SURGERY    . COLON SURGERY    . COLONOSCOPY    . CYSTOSCOPY     10-26-17 budyzn  . CYSTOSCOPY/URETEROSCOPY/HOLMIUM LASER/STENT PLACEMENT Left 10/26/2017   Procedure: CYSTOSCOPY, URETEROSCOPY/RETROGRADE/STENT PLACEMENT;  Surgeon: Nickie Retort, MD;  Location: [facility];  Service: Urology;  Laterality: Left;  NEEDS DIGITAL URETEROSCOPE  . CYSTOSCOPY/URETEROSCOPY/HOLMIUM LASER/STENT PLACEMENT Left 11/12/2017   Procedure: CYSTOSCOPY LEFT RETROGRADE PYELOGRAM LEFT URETEROSCOPY HOLMIUM LASER AND LEFT URETERAL STENT EXCHANGE AND LASER ENDO URETEROTOMY;  Surgeon: Ardis Hughs, MD;  Location: [facility];  Service: Urology;   Laterality: Left;  . ESOPHAGOGASTRODUODENOSCOPY  multiple    SOCIAL HISTORY: Social History   Socioeconomic History  . Marital status: Single    Spouse name: Not on file  . Number of children: Not on file  . Years of education: Not on file  . Highest education level: Not on file  Occupational History  . Not on file  Social Needs  . Financial resource strain: Not on file  . Food insecurity:    Worry: Not on file    Inability: Not on file  . Transportation needs:    Medical: Not on file    Non-medical: Not on file  Tobacco Use  . Smoking status: Never Smoker  . Smokeless tobacco: Never Used  Substance and Sexual Activity  . Alcohol use: No  . Drug use: No  . Sexual activity: Not Currently    Partners: Male  Lifestyle  . Physical activity:    Days per week: Not on file    Minutes per session: Not on file  . Stress: Not on file  Relationships  . Social connections:    Talks on phone: Not on file    Gets together: Not on file    Attends religious service: Not on file    Active member of club or organization: Not on file    Attends meetings of clubs or organizations: Not on file    Relationship status: Not on file  . Intimate partner violence:    Fear of current or ex partner: Not on file    Emotionally abused: Not on file    Physically abused: Not on file    Forced sexual activity: Not on file  Other Topics Concern  . Not on file  Social History Narrative  . Not on file    FAMILY HISTORY: Family History  Problem Relation Age of Onset  . Heart failure Mother        died from  . Hypertension Mother   . Heart attack Father        died from  . Hypertension Sister     ALLERGIES:  is allergic to amoxicillin; ciprofloxacin; sulfonamide derivatives; robaxin [methocarbamol]; and verapamil.  MEDICATIONS:  Current Facility-Administered Medications  Medication Dose Route Frequency Provider Last Rate Last Dose  . 0.9 %  sodium chloride infusion   Intravenous  Continuous Elodia Florence., MD 100 mL/hr at 12/15/17 [phone]    . acetaminophen (TYLENOL) tablet 650 mg  650 mg Oral Q6H PRN Rise Patience, MD       Or  . acetaminophen (TYLENOL) suppository 650 mg  650 mg Rectal Q6H PRN Rise Patience, MD      . cefTRIAXone (ROCEPHIN) 2 g in sodium chloride 0.9 % 100 mL IVPB  2 g Intravenous Q24H Rise Patience, MD   Stopped at 12/14/17 2234  . folic acid (FOLVITE) tablet 1 mg  1 mg Oral Daily Elodia Florence., MD   1 mg at 12/15/17 0904  . heparin ADULT infusion 100 units/mL (25000 units/248mL sodium chloride 0.45%)  1,150 Units/hr Intravenous Continuous Elodia Florence., MD 11.5 mL/hr at 12/15/17 0614 1,150 Units/hr at 12/15/17 0614  . levothyroxine (SYNTHROID, LEVOTHROID)  tablet 150 mcg  150 mcg Oral QAC breakfast Elodia Florence., MD   150 mcg at 12/15/17 0610  . metoprolol tartrate (LOPRESSOR) tablet 25 mg  25 mg Oral BID Lyda Jester M, PA-C   25 mg at 12/15/17 6789  . ondansetron (ZOFRAN) tablet 4 mg  4 mg Oral Q6H PRN Rise Patience, MD       Or  . ondansetron [facility]) injection 4 mg  4 mg Intravenous Q6H PRN Rise Patience, MD   4 mg at 12/14/17 3810  . pantoprazole (PROTONIX) 80 mg in sodium chloride 0.9 % 100 mL IVPB  80 mg Intravenous Once Elodia Florence., MD      . pantoprazole (PROTONIX) 80 mg in sodium chloride 0.9 % 250 mL (0.32 mg/mL) infusion  8 mg/hr Intravenous Continuous Elodia Florence., MD      . phytonadione (VITAMIN K) 1 mg in dextrose 5 % 50 mL IVPB  1 mg Intravenous Once Elodia Florence., MD   Stopped at 12/14/17 1003  . vancomycin (VANCOCIN) 50 mg/mL oral solution 125 mg  125 mg Oral QID Schorr, Rhetta Mura, NP   125 mg at 12/15/17 1522    REVIEW OF SYSTEMS:    10 Point review of Systems was done is negative except as noted above.  PHYSICAL EXAMINATION: ECOG PERFORMANCE STATUS: 3 - Symptomatic, >50% confined to bed  . Vitals:   12/15/17 0811 12/15/17  1319  BP: (!) 104/52 107/70  Pulse: (!) 106 97  Resp: 19 (!) 26  Temp: 97.6 F (36.4 C) 98 F (36.7 C)  SpO2: 95% 96%   Filed Weights   12/13/17 1500 12/13/17 2128 12/15/17 0600  Weight: 220 lb (99.8 kg) 219 lb 9.3 oz (99.6 kg) 225 lb 15.5 oz (102.5 kg)   .Body mass index is 41.33 kg/m.  GENERAL:alert, in no acute distress and comfortable SKIN: no acute rashes, no significant lesions EYES: conjunctiva are pink and non-injected, sclera anicteric OROPHARYNX: MMM, no exudates, no oropharyngeal erythema or ulceration NECK: supple, no JVD LYMPH:  no palpable lymphadenopathy in the cervical, axillary or inguinal regions LUNGS: clear to auscultation b/l with normal respiratory effort HEART: irregular. ABDOMEN:  normoactive bowel sounds , non tender, not distended. Extremity: LLE 3+ edema . RLE 1+ edema PSYCH: alert & oriented x 3 with fluent speech NEURO: no focal motor/sensory deficits  LABORATORY DATA:  I have reviewed the data as listed  . CBC Latest Ref Rng & Units 12/15/2017 12/15/2017 12/15/2017  WBC 4.0 - 10.5 K/uL - - 18.2(H)  Hemoglobin 12.0 - 15.0 g/dL 10.7(L) 10.0(L) 10.2(L)  Hematocrit 36.0 - 46.0 % 31.7(L) 31.2(L) 32.0(L)  Platelets 150 - 400 K/uL - - 375    . CMP Latest Ref Rng & Units 12/15/2017 12/15/2017 12/14/2017  Glucose 70 - 99 mg/dL - 81 112(H)  BUN 8 - 23 mg/dL - 51(H) 53(H)  Creatinine 0.44 - 1.00 mg/dL - 1.50(H) 1.62(H)  Sodium 135 - 145 mmol/L - 135 136  Potassium 3.5 - 5.1 mmol/L 4.2 5.5(H) 4.3  Chloride 98 - 111 mmol/L - 111 107  CO2 22 - 32 mmol/L - 23 20(L)  Calcium 8.9 - 10.3 mg/dL - 8.8(L) 9.2  Total Protein 6.5 - 8.1 g/dL - 4.4(L) -  Total Bilirubin 0.3 - 1.2 mg/dL - 1.7(H) -  Alkaline Phos 38 - 126 U/L - 65 -  AST 15 - 41 U/L - 83(H) -  ALT 0 - 44 U/L - 28 -  Component     Latest Ref Rng & Units 04/12/2017 12/13/2017 12/13/2017 12/15/2017          3:14 PM 10:59 PM   Prothrombin Time     11.4 - 15.2 seconds 19.3 (H) 34.9 (H) 39.7 (H) 30.1 (H)   INR      1.64 3.50 4.13 (HH) 2.90    . Lab Results  Component Value Date   IRON 18 (L) 12/14/2017   TIBC 193 (L) 12/14/2017   IRONPCTSAT 9 (L) 12/14/2017   (Iron and TIBC)  Lab Results  Component Value Date   FERRITIN 212 12/14/2017     RADIOGRAPHIC STUDIES: I have personally reviewed the radiological images as listed and agreed with the findings in the report. Dg Chest 2 View  Result Date: 12/13/2017 CLINICAL DATA:  Hypotension today. EXAM: CHEST - 2 VIEW COMPARISON:  CT chest 12/04/2017. The single view of the chest 04/14/2017. FINDINGS: There small to moderate bilateral pleural effusions and basilar atelectasis. Cardiomegaly without edema is identified. No pneumothorax. Aortic atherosclerosis is seen. No acute bony abnormality. IMPRESSION: Small to moderate bilateral pleural effusions and basilar atelectasis, greater on the right. Cardiomegaly without edema. Atherosclerosis. Electronically Signed   By: Inge Rise M.D.   On: 12/13/2017 15:46   Ct Angio Chest Pe W/cm &/or Wo Cm  Result Date: 12/04/2017 CLINICAL DATA:  Shortness of breath on exertion, history of deep venous thrombosis. EXAM: CT ANGIOGRAPHY CHEST WITH CONTRAST TECHNIQUE: Multidetector CT imaging of the chest was performed using the standard protocol during bolus administration of intravenous contrast. Multiplanar CT image reconstructions and MIPs were obtained to evaluate the vascular anatomy. CONTRAST:  126mL ISOVUE-370 IOPAMIDOL (ISOVUE-370) INJECTION 76% COMPARISON:  09/09/2017 FINDINGS: Cardiovascular: Thoracic aorta demonstrates atherosclerotic calcifications without aneurysmal dilatation. Opacification is limited precluding evaluation for any possible dissection. Mild cardiac enlargement is seen. No right heart strain is noted. The pulmonary artery shows a normal branching pattern with filling defects in the left lower and upper lobes as well as significant filling defects within the right lower lobe consistent  with pulmonary emboli. Mediastinum/Nodes: The esophagus is within normal limits. No significant hilar or mediastinal adenopathy is noted. Thoracic inlet is within normal limits. Lungs/Pleura: Left lung is well aerated. Minimal left pleural effusion is noted. Mild left basilar atelectasis is seen. The right lung is also well aerated. A large right-sided pleural effusion is noted which is increased in the interval from the prior CT of the abdomen and pelvis. Right lower lobe consolidation is noted related to the underlying pulmonary embolus. This has increased in the interval from the prior exam. Upper Abdomen: Visualized upper abdomen demonstrates cirrhotic change of the liver is well as ascites. There is at least 1 lesion identified within the left lobe of the liver stable in appearance from the prior exam. This has been previously evaluated utilizing MR the abdomen. No other significant upper abdominal abnormality is seen. Musculoskeletal: Degenerative changes of the thoracic spine are noted. Review of the MIP images confirms the above findings. IMPRESSION: Changes consistent with bilateral pulmonary emboli right greater than left with associated changes in the lower lobes bilaterally as well as bilateral pleural effusions right considerably greater than left. No right heart strain is noted. Changes in the upper abdomen consistent with cirrhosis and ascites. Stable benign hepatic lesion is noted. Aortic Atherosclerosis (ICD10-I70.0). Critical Value/emergent results were called by telephone at the time of interpretation on 12/04/2017 at 8:19 pm to [facility] , who verbally acknowledged these results. Electronically  Signed   By: Inez Catalina M.D.   On: 12/04/2017 20:21   Ct Renal Stone Study  Result Date: 12/13/2017 CLINICAL DATA:  Nausea, vomiting, weakness, history cirrhosis, GERD, kidney stones, hypertension EXAM: CT ABDOMEN AND PELVIS WITHOUT CONTRAST TECHNIQUE: Multidetector CT imaging of the abdomen  and pelvis was performed following the standard protocol without IV contrast. Sagittal and coronal MPR images reconstructed from axial data set. Oral contrast was not administered. COMPARISON:  09/09/2017 FINDINGS: Lower chest: Biatrial enlargement. Bibasilar pleural effusions moderate RIGHT and small LEFT. Bibasilar atelectasis. Hepatobiliary: Small cirrhotic liver. Post cholecystectomy. Low-attenuation lesion with peripheral calcification lateral segment LEFT lobe liver 2.0 x 1.9 cm not significantly changed. Pancreas: Atrophic pancreas without mass Spleen: Minimally enlarged, 15.8 x 12.9 x 6.8 cm (volume = 730 cm^3). No focal lesion. Adrenals/Urinary Tract: Unremarkable adrenal glands. Atrophic kidneys with minimal chronic nodularity. Persistent mid LEFT ureteral calculus 7 mm diameter. Bladder decompressed. No additional urinary tract calcification. Stomach/Bowel: Prior bowel resection with anastomotic staple line in RIGHT abdomen likely ileocecectomy. Mild diffuse colonic wall thickening versus artifact from underdistention. Additional staple line in the RIGHT upper quadrant at a small bowel loop. This loop demonstrates diffuse bowel wall thickening and is immediately caudal to the transverse colon. Multiple additional small bowel loops appear dilated and demonstrate diffuse wall thickening. Overall appearance is nonspecific. 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 Vascular/Lymphatic: Scattered atherosclerotic calcifications aorta and iliac arteries. Aorta normal caliber. Reproductive: Uterus surgically absent. Nonvisualization of ovaries. Other: Significant ascites unchanged.  No free air.  No hernia. Musculoskeletal: Osseous demineralization. IMPRESSION: Cirrhotic liver with significant ascites and splenomegaly.  Diffuse bowel wall thickening of large and small bowel loops throughout abdomen, present since 09/09/2017; differential diagnosis includes enteritis such as from infection or inflammatory bowel disease, portal enteropathy in the setting of cirrhosis, ischemia not excluded but considered less likely due to chronicity. Bibasilar effusions and atelectasis greater on RIGHT. Chronic mid LEFT ureteral calculus without hydronephrosis/hydroureter. Electronically Signed   By: Lavonia Dana M.D.   On: 12/13/2017 22:55    ASSESSMENT & PLAN:   77 y.o. female with  1. Recent LLE DVT and b/l acute Pulmonary embolism  In the setting of hospitalization and multiple risk factors - CHF, immobility  2. Elevated PT/INR - likely from NASH + vit K deficiency (in the setting of C diff diarrhea and Antibiotic use)  PLAN -patient has multiple risk factors for VTE currently CHF, immobility, sepsis, c diff/infections etc.. -her liver cirrhosis puts her at risk of bleeding (coagulopathy/varices/AVM etc) and increased risk of clotting (depletion of protein C/protein S, dysfibrinogenemia). -given improvement in PT/INR with vit K would continue vit K 5mg  po daily for 1-2 weeks to evaluate for further improvement and assessment baseline PT/INR from liver cirrhosis. -cannot use anticoagulants that are based on INR monitoring (no coumadin) -use of anticoagulation is a risk vs benefit discussion based on any concerns for bleeding. -currently on IV heparin which is reasonable. Once stable if no bleeding issues could restart on Eliquis 2.5mg  po BID (if creatine >= 1.5) and is liver cirrhosis < Child Pugh Class C. Alternatively could use renally adjusted lovenox   3. Sepsis 4. C diff colitis . Patient Active Problem List   Diagnosis Date Noted  . Bacteremia   . Heme positive stool   . Clostridium difficile colitis   .  Diarrhea of infectious origin   . Sepsis ([place]) 12/13/2017  . GERD (gastroesophageal reflux disease)  12/04/2017  . DVT (deep venous thrombosis) ([place]) 12/04/2017  . PE (pulmonary thromboembolism) ([place]) 12/04/2017  . Pleural effusion 12/04/2017  . Acute pulmonary embolism without acute cor pulmonale (HCC)   . Ascites 09/29/2017  . Chronic diarrhea 09/04/2017  . Long term current use of anticoagulant - apixaban 09/04/2017  . Fatigue 05/28/2017  . Chronic combined systolic and diastolic CHF (congestive heart failure) ([place])   . Dilated cardiomyopathy ([place])   . Contraindication to anticoagulation therapy   . Acute lower UTI 04/11/2017  . Reactive airway disease, mild intermittent, uncomplicated 10/62/6948  . Obstructive sleep apnea 01/08/2015  . Essential hypertension 12/21/2014  . Prediabetes 12/21/2014  . Hypothyroidism 12/21/2014  . Encounter for Medicare annual wellness exam 12/20/2014  . Medication management 09/12/2014  . Vitamin D deficiency 09/12/2014  . Atrial fibrillation with RVR ([place]) 05/11/2014  . Esophageal varices ([place]) 02/09/2014  . Left sided sciatica 02/09/2014  . History of colonic polyps 03/25/2012  . Obesity 09/27/2009  . History of small bowel obstruction 12/31/2007  . Hepatic cirrhosis ([place]) 12/31/2007  . PORTAL HYPERTENSION 12/31/2007  -Mx per hospitalist.   All of the patients questions were answered with apparent satisfaction. The patient knows to call the clinic with any problems, questions or concerns.  The total time spent in the appt was 80 minutes and more than 50% was on counseling and direct patient cares and discussion with hospitalist    Sullivan Lone MD [place] AAHIVMS [facility] Hematology/Oncology Physician [facility]  (Office):       [phone] (Work cell):  [phone] (Fax):           [phone]  12/15/2017 4:10 PM

## 2017-12-15 NOTE — Progress Notes (Signed)
Patient tearful this afternoon stating her L arm is very sore from blood draws and swollen. L arm is edematous and bruised. She is also feeling overwhelmed with illness. Gave patient opportunity to talk for a bit and stated she felt a bit better. Asked if she had any family in the area or close friends and it does not appear so. She did mention and neighbor but no one else.

## 2017-12-15 NOTE — H&P (View-Only) (Signed)
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 She has dark heme positive stools / Anemia. Slowly declining hgb on anticoagulation . Hgb down 3 grams since late June.  -INR quite elevated at 4.13. Vit K given yesterday and INR down to 2.9.  Repeat H+H pending.  -She had small varices on last EGD in 2013. On exam, patient incontinent of iquid black stool. She will need EGD this admission, likely tomorrow at noon with heparin being held 5 hours prior. The risks and benefits of EGD were discussed and the patient agrees to proceed.  -start IV PPI.  -already on Rocephin which should cover her for SBP prophylaxis  Sepsis, reason for admission. Blood Cx growing gram positive cocci in chains BUT urine cx >> Klebsiella Pneumonae (gram neg).  -If no clear cut source of sepsis then should get u/s to check for ascites and do diagnostic paracentesis but INR still up and on heparin gtt .  She is on rocephin.   C-diff infection. Mild colonic wall thickening on non-contrast CT scan, could be colitis. -On Vanco 125 mg QID -will add florastor bid  Recently diagnosed DVT / PE on Eliquis at home. Currently getting heparin gtt  AFIB with RVR. Rate improved. On Metoprolol  AKI, improving  Ureteral stone extraction early June    Hx of rectal adenoma, due for surveillance colonoscopy   HPI: Brittney Valentine is a 77 y.o. female with multiple medical problems. Hospitalized last month with LLE DVT and PE. She is knonw to [place] for hx of chronic diarrhea, rectal adenoma 2013 and cryptogenic cirrhosis complicated by portal hypertension   Brittney Valentine was hospitalized late June with DVT / PE. Discharged home 6/30, Returned to ED two days ago with N/V and  weakness. Hypotensive and tachycardic in ED, lactate and WBC elevated. Blood cx  >>> gram + cocci in chains but urine cx + Klebsiella. Nurse noticed stools being black this admission.   [name] reports black stools started during recent hospitalization and continued at home. On Eliquis. No nsaids. She began having terrible diarrhea upon discharge. She began having N / V and eventually became very weak. Stool is positive for C-diff. No significant abdominal pain. No hematemesis.   Past Medical History:  Diagnosis Date  . Acute UTI   . Anemia   . Blood transfusion without reported diagnosis   . Cirrhosis ([place])   . Degenerative disk disease    knees  . Diverticulosis   . Dysrhythmia   . GERD (gastroesophageal reflux disease)   . Hemorrhoids   . History of kidney stones   . Hypertension   . Hypothyroidism   . Obesity   . Paroxysmal atrial fibrillation ([facility])   . Partial small bowel obstruction ([place])   . Personal history of colonic polyps 03/25/2012   8 mm rectal adenoma 03/2012  . Portal hypertensive gastropathy ([place])   . Shingles   . Thyroid disease   . UTI (urinary tract infection) 12/13/2017  . Varices, esophageal ([place])   . Zoster     Past Surgical History:  Procedure Laterality Date  . ABDOMINAL HYSTERECTOMY    . APPENDECTOMY  1991  . CHOLECYSTECTOMY  2000  . COLON SURGERY    . COLON SURGERY    .  COLONOSCOPY    . CYSTOSCOPY     10-26-17 budyzn  . CYSTOSCOPY/URETEROSCOPY/HOLMIUM LASER/STENT PLACEMENT Left 10/26/2017   Procedure: CYSTOSCOPY, URETEROSCOPY/RETROGRADE/STENT PLACEMENT;  Surgeon: Nickie Retort, MD;  Location: [facility];  Service: Urology;  Laterality: Left;  NEEDS DIGITAL URETEROSCOPE  . CYSTOSCOPY/URETEROSCOPY/HOLMIUM LASER/STENT PLACEMENT Left 11/12/2017   Procedure: CYSTOSCOPY LEFT RETROGRADE PYELOGRAM LEFT URETEROSCOPY HOLMIUM LASER AND LEFT URETERAL STENT EXCHANGE AND LASER ENDO URETEROTOMY;  Surgeon: Ardis Hughs, MD;  Location: [facility];  Service:  Urology;  Laterality: Left;  . ESOPHAGOGASTRODUODENOSCOPY  multiple    Prior to Admission medications   Medication Sig Start Date End Date Taking? Authorizing Provider  apixaban (ELIQUIS) 5 MG TABS tablet Take 1 tablet (5 mg total) by mouth 2 (two) times daily. 12/12/17  Yes Florencia Reasons, MD  Cholecalciferol (VITAMIN D-3) 1000 UNITS CAPS Take 1,000 Units by mouth 2 (two) times daily.    Yes [provider]  furosemide (LASIX) 20 MG tablet Take 20 mg by mouth 2 (two) times daily.   Yes [provider]  lactulose (CHRONULAC) 10 GM/15ML solution Take 15 mLs (10 g total) by mouth daily. 12/07/17  Yes Florencia Reasons, MD  levothyroxine (SYNTHROID) 150 MCG tablet Take 1 tablet (150 mcg total) by mouth daily. 12/06/17 12/06/18 Yes Florencia Reasons, MD  metoprolol succinate (TOPROL-XL) 25 MG 24 hr tablet Take 1 tablet (25 mg total) by mouth daily. 12/07/17  Yes Florencia Reasons, MD  ranitidine (ZANTAC) 300 MG tablet Take 1 tablet (300 mg total) at bedtime by mouth. Patient taking differently: Take 300 mg by mouth at bedtime as needed for heartburn.  04/22/17 04/22/18 Yes Vicie Mutters, PA-C  spironolactone (ALDACTONE) 100 MG tablet Take 1 tablet (100 mg total) by mouth daily. 12/06/17  Yes Florencia Reasons, MD  apixaban (ELIQUIS) 5 MG TABS tablet Take 2 tablets (10 mg total) by mouth 2 (two) times daily for 6 days. Patient not taking: Reported on 12/13/2017 12/06/17 12/13/17  Florencia Reasons, MD    Current Facility-Administered Medications  Medication Dose Route Frequency Provider Last Rate Last Dose  . 0.9 %  sodium chloride infusion   Intravenous Continuous Elodia Florence., MD 100 mL/hr at 12/15/17 [phone]    . acetaminophen (TYLENOL) tablet 650 mg  650 mg Oral Q6H PRN Rise Patience, MD       Or  . acetaminophen (TYLENOL) suppository 650 mg  650 mg Rectal Q6H PRN Rise Patience, MD      . cefTRIAXone (ROCEPHIN) 2 g in sodium chloride 0.9 % 100 mL IVPB  2 g Intravenous Q24H Rise Patience, MD   Stopped at  12/14/17 2234  . folic acid (FOLVITE) tablet 1 mg  1 mg Oral Daily Elodia Florence., MD   1 mg at 12/15/17 0904  . heparin ADULT infusion 100 units/mL (25000 units/216mL sodium chloride 0.45%)  1,150 Units/hr Intravenous Continuous Erenest Blank, RPH 11.5 mL/hr at 12/15/17 0614 1,150 Units/hr at 12/15/17 0614  . levothyroxine (SYNTHROID, LEVOTHROID) tablet 150 mcg  150 mcg Oral QAC breakfast Elodia Florence., MD   150 mcg at 12/15/17 0610  . metoprolol tartrate (LOPRESSOR) tablet 25 mg  25 mg Oral BID Lyda Jester M, PA-C   25 mg at 12/15/17 6387  . ondansetron (ZOFRAN) tablet 4 mg  4 mg Oral Q6H PRN Rise Patience, MD       Or  . ondansetron [facility]) injection 4 mg  4 mg Intravenous Q6H PRN Rise Patience,  MD   4 mg at 12/14/17 0852  . phytonadione (VITAMIN K) 1 mg in dextrose 5 % 50 mL IVPB  1 mg Intravenous Once Elodia Florence., MD   Stopped at 12/14/17 1003  . vancomycin (VANCOCIN) 50 mg/mL oral solution 125 mg  125 mg Oral QID Schorr, Rhetta Mura, NP   125 mg at 12/15/17 0905    Allergies as of 12/13/2017 - Review Complete 12/13/2017  Allergen Reaction Noted  . Amoxicillin Palpitations 11/25/2017  . Ciprofloxacin Swelling 05/17/2013  . Sulfonamide derivatives Other (See Comments) 12/31/2007  . Robaxin [methocarbamol] Palpitations 02/09/2014  . Verapamil Palpitations 02/09/2014    Family History  Problem Relation Age of Onset  . Heart failure Mother        died from  . Hypertension Mother   . Heart attack Father        died from  . Hypertension Sister     Social History   Socioeconomic History  . Marital status: Single    Spouse name: Not on file  . Number of children: Not on file  . Years of education: Not on file  . Highest education level: Not on file  Occupational History  . Not on file  Social Needs  . Financial resource strain: Not on file  . Food insecurity:    Worry: Not on file    Inability: Not on file  .  Transportation needs:    Medical: Not on file    Non-medical: Not on file  Tobacco Use  . Smoking status: Never Smoker  . Smokeless tobacco: Never Used  Substance and Sexual Activity  . Alcohol use: No  . Drug use: No  . Sexual activity: Not Currently    Partners: Male  Lifestyle  . Physical activity:    Days per week: Not on file    Minutes per session: Not on file  . Stress: Not on file  Relationships  . Social connections:    Talks on phone: Not on file    Gets together: Not on file    Attends religious service: Not on file    Active member of club or organization: Not on file    Attends meetings of clubs or organizations: Not on file    Relationship status: Not on file  . Intimate partner violence:    Fear of current or ex partner: Not on file    Emotionally abused: Not on file    Physically abused: Not on file    Forced sexual activity: Not on file  Other Topics Concern  . Not on file  Social History Narrative  . Not on file    Review of Systems: All systems reviewed and negative except where noted in HPI.  Physical Exam: Vital signs in last 24 hours: Temp:  [97.6 F (36.4 C)-98.4 F (36.9 C)] 97.6 F (36.4 C) (07/09 0811) Pulse Rate:  [99-108] 106 (07/09 0811) Resp:  [16-23] 19 (07/09 0811) BP: (89-111)/(49-70) 104/52 (07/09 0811) SpO2:  [94 %-96 %] 95 % (07/09 0811) Weight:  [225 lb 15.5 oz (102.5 kg)] 225 lb 15.5 oz (102.5 kg) (07/09 0600) Last BM Date: 12/14/17 General:   Pleasant obese female in NAD Psych:  Pleasant, cooperative. Normal mood and affect. Eyes:  Pupils equal, sclera clear, no icterus.   Conjunctiva pink. Ears:  Slightly diminished hearing. . Nose:  No deformity, discharge,  or lesions. Neck:  Supple; no masses Lungs:  Clear throughout to auscultation.  Marland Kitchen  Heart:  Irregular  rate and rhythm;  Abdomen:  Soft, non-distended, nontender, BS active, no palp mass    Rectal:  Incontinent of liquid black stool  Msk:  Symmetrical without gross  deformities. . Neurologic:  Alert and  oriented x4;  grossly normal neurologically. Skin:  Intact without significant lesions or rashes..   Intake/Output from previous day: 07/08 0701 - 07/09 0700 In: 3367.7 [P.O.:240; I.V.:2135.6; IV Piggyback:992.1] Out: 670 [Urine:670] Intake/Output this shift: No intake/output data recorded.  Lab Results: Recent Labs    12/14/17 0338 12/14/17 0749 12/14/17 1634 12/15/17 0333  WBC 23.7* 24.6*  --  18.2*  HGB 10.7* 10.8* 10.8* 10.2*  HCT 32.2* 32.0* 32.5* 32.0*  PLT 379 365  --  375   BMET Recent Labs    12/14/17 0338 12/14/17 1634 12/15/17 0333 12/15/17 0807  NA 139 136 135  --   K 4.8 4.3 5.5* 4.2  CL 113* 107 111  --   CO2 21* 20* 23  --   GLUCOSE 105* 112* 81  --   BUN 50* 53* 51*  --   CREATININE 1.63* 1.62* 1.50*  --   CALCIUM 9.3 9.2 8.8*  --    LFT Recent Labs    12/15/17 0333  PROT 4.4*  ALBUMIN 1.7*  AST 83*  ALT 28  ALKPHOS 65  BILITOT 1.7*  BILIDIR 0.6*  IBILI 1.1*   PT/INR Recent Labs    12/13/17 2259 12/15/17 0333  LABPROT 39.7* 30.1*  INR 4.13* 2.90    Studies/Results: Dg Chest 2 View  Result Date: 12/13/2017 CLINICAL DATA:  Hypotension today. EXAM: CHEST - 2 VIEW COMPARISON:  CT chest 12/04/2017. The single view of the chest 04/14/2017. FINDINGS: There small to moderate bilateral pleural effusions and basilar atelectasis. Cardiomegaly without edema is identified. No pneumothorax. Aortic atherosclerosis is seen. No acute bony abnormality. IMPRESSION: Small to moderate bilateral pleural effusions and basilar atelectasis, greater on the right. Cardiomegaly without edema. Atherosclerosis. Electronically Signed   By: Inge Rise M.D.   On: 12/13/2017 15:46   Ct Renal Stone Study  Result Date: 12/13/2017 CLINICAL DATA:  Nausea, vomiting, weakness, history cirrhosis, GERD, kidney stones, hypertension EXAM: CT ABDOMEN AND PELVIS WITHOUT CONTRAST TECHNIQUE: Multidetector CT imaging of the abdomen and  pelvis was performed following the standard protocol without IV contrast. Sagittal and coronal MPR images reconstructed from axial data set. Oral contrast was not administered. COMPARISON:  09/09/2017 FINDINGS: Lower chest: Biatrial enlargement. Bibasilar pleural effusions moderate RIGHT and small LEFT. Bibasilar atelectasis. Hepatobiliary: Small cirrhotic liver. Post cholecystectomy. Low-attenuation lesion with peripheral calcification lateral segment LEFT lobe liver 2.0 x 1.9 cm not significantly changed. Pancreas: Atrophic pancreas without mass Spleen: Minimally enlarged, 15.8 x 12.9 x 6.8 cm (volume = 730 cm^3). No focal lesion. Adrenals/Urinary Tract: Unremarkable adrenal glands. Atrophic kidneys with minimal chronic nodularity. Persistent mid LEFT ureteral calculus 7 mm diameter. Bladder decompressed. No additional urinary tract calcification. Stomach/Bowel: Prior bowel resection with anastomotic staple line in RIGHT abdomen likely ileocecectomy. Mild diffuse colonic wall thickening versus artifact from underdistention. Additional staple line in the RIGHT upper quadrant at a small bowel loop. This loop demonstrates diffuse bowel wall thickening and is immediately caudal to the transverse colon. Multiple additional small bowel loops appear dilated and demonstrate diffuse wall thickening. Overall appearance is nonspecific. 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 Vascular/Lymphatic:  Scattered atherosclerotic calcifications aorta and iliac arteries. Aorta normal caliber. Reproductive: Uterus surgically absent. Nonvisualization of ovaries. Other: Significant ascites unchanged.  No free air.  No hernia. Musculoskeletal: Osseous demineralization. IMPRESSION: Cirrhotic liver with significant ascites and splenomegaly.  Diffuse bowel wall thickening of large and small bowel loops throughout abdomen, present since 09/09/2017; differential diagnosis includes enteritis such as from infection or inflammatory bowel disease, portal enteropathy in the setting of cirrhosis, ischemia not excluded but considered less likely due to chronicity. Bibasilar effusions and atelectasis greater on RIGHT. Chronic mid LEFT ureteral calculus without hydronephrosis/hydroureter. Electronically Signed   By: Lavonia Dana M.D.   On: 12/13/2017 22:55     Tye Savoy, NP-C @  12/15/2017, 12:53 PM   Attending physician's note   I have taken a history, examined the patient and reviewed the chart. I agree with the Advanced Practitioner's note, impression and recommendations. 59 yr F with cirrhosis, NASH, C.diff colitis and bacteremia (Gram positive cocci), having dark liquid diarrhea.  Patient had any episode of incontinent bowel movement while I was in the room, appeared dark green not melenic, heme positive. Hgb drop likely multifactorial.  INR 2.9, on heparin gtt Plan for diagnostic egd tomorrow. Prefer INR below 2, around 1.5. Will recheck in the AM.  Will hold off reversing INR unless has active GI hemorrhage with hemodynamic instability Continue PO Vancomycin The risks and benefits as well as alternatives of endoscopic procedure(s) have been discussed and reviewed. All questions answered. The patient agrees to proceed.    Damaris Hippo , MD [phone]

## 2017-12-15 NOTE — Progress Notes (Signed)
Spoke with patient's nurse instructed to request order from MD and PICC nurse will further assess for PICC appropriateness. Fran Lowes, RN VAST

## 2017-12-15 NOTE — Progress Notes (Addendum)
Progress Note  Patient Name: Brittney Valentine Date of Encounter: 12/15/2017  Primary Cardiologist: Minus Breeding, MD   Subjective   Still can feel palpitations. Also feels weak. Having frequent diarrhea.   Her heart rate is generally slower and she seems to be doing well from a cardiac standpoint.  She still has significant GI issues.  Inpatient Medications    Scheduled Meds: . folic acid  1 mg Oral Daily  . hydrocortisone  25 mg Rectal BID  . levothyroxine  150 mcg Oral QAC breakfast  . metoprolol tartrate  25 mg Oral BID  . vancomycin  125 mg Oral QID   Continuous Infusions: . sodium chloride 100 mL/hr at 12/14/17 2238  . cefTRIAXone (ROCEPHIN)  IV Stopped (12/14/17 2234)  . heparin 1,150 Units/hr (12/15/17 3295)  . phytonadione (VITAMIN K) IV Stopped (12/14/17 1003)   PRN Meds: acetaminophen **OR** acetaminophen, ondansetron **OR** ondansetron (ZOFRAN) IV   Vital Signs    Vitals:   12/15/17 0347 12/15/17 0400 12/15/17 0511 12/15/17 0600  BP: (!) 111/51 (!) 111/57    Pulse: (!) 103 100    Resp: 16 20    Temp:  97.6 F (36.4 C) 97.6 F (36.4 C)   TempSrc:  Oral Oral   SpO2: 95%     Weight:    225 lb 15.5 oz (102.5 kg)  Height:        Intake/Output Summary (Last 24 hours) at 12/15/2017 0800 Last data filed at 12/15/2017 0640 Gross per 24 hour  Intake 3367.68 ml  Output 670 ml  Net 2697.68 ml   Filed Weights   12/13/17 1500 12/13/17 2128 12/15/17 0600  Weight: 220 lb (99.8 kg) 219 lb 9.3 oz (99.6 kg) 225 lb 15.5 oz (102.5 kg)    Telemetry    Atrial fibrillation in the 110s - Personally Reviewed  ECG    Admit EKG Atrial fibrillation in the 140s - Personally Reviewed  Physical Exam   GEN: No acute distress.   Neck: No JVD Cardiac: irreguarlly irregular rhythm, mildly tachy rate, no murmurs, rubs, or gallops.  Respiratory: Clear to auscultation bilaterally. GI: Soft, nontender, non-distended  MS: 2+ edema on the left LE (DVT) Neuro:  Nonfocal    Psych: Normal affect   Labs    Chemistry Recent Labs  Lab 12/14/17 0338 12/14/17 1634 12/15/17 0333  NA 139 136 135  K 4.8 4.3 5.5*  CL 113* 107 111  CO2 21* 20* 23  GLUCOSE 105* 112* 81  BUN 50* 53* 51*  CREATININE 1.63* 1.62* 1.50*  CALCIUM 9.3 9.2 8.8*  PROT 4.7*  --  4.4*  ALBUMIN 1.8*  --  1.7*  AST 62*  --  83*  ALT 32  --  28  ALKPHOS 59  --  65  BILITOT 1.9*  --  1.7*  GFRNONAA 30* 30* 33*  GFRAA 35* 35* 38*  ANIONGAP 5 9 1*     Hematology Recent Labs  Lab 12/14/17 0338 12/14/17 0749 12/14/17 1634 12/15/17 0333  WBC 23.7* 24.6*  --  18.2*  RBC 3.49* 3.47*  --  3.30*  HGB 10.7* 10.8* 10.8* 10.2*  HCT 32.2* 32.0* 32.5* 32.0*  MCV 92.3 92.2  --  97.0  MCH 30.7 31.1  --  30.9  MCHC 33.2 33.8  --  31.9  RDW 24.3* 24.3*  --  23.9*  PLT 379 365  --  375    Cardiac Enzymes Recent Labs  Lab 12/14/17 0338 12/14/17 0749  TROPONINI  0.11* 0.10*    Recent Labs  Lab 12/13/17 1518  TROPIPOC 0.07     BNP Recent Labs  Lab 12/14/17 0338  BNP 408.3*     DDimer No results for input(s): DDIMER in the last 168 hours.   Radiology    Dg Chest 2 View  Result Date: 12/13/2017 CLINICAL DATA:  Hypotension today. EXAM: CHEST - 2 VIEW COMPARISON:  CT chest 12/04/2017. The single view of the chest 04/14/2017. FINDINGS: There small to moderate bilateral pleural effusions and basilar atelectasis. Cardiomegaly without edema is identified. No pneumothorax. Aortic atherosclerosis is seen. No acute bony abnormality. IMPRESSION: Small to moderate bilateral pleural effusions and basilar atelectasis, greater on the right. Cardiomegaly without edema. Atherosclerosis. Electronically Signed   By: Inge Rise M.D.   On: 12/13/2017 15:46   Ct Renal Stone Study  Result Date: 12/13/2017 CLINICAL DATA:  Nausea, vomiting, weakness, history cirrhosis, GERD, kidney stones, hypertension EXAM: CT ABDOMEN AND PELVIS WITHOUT CONTRAST TECHNIQUE: Multidetector CT imaging of the  abdomen and pelvis was performed following the standard protocol without IV contrast. Sagittal and coronal MPR images reconstructed from axial data set. Oral contrast was not administered. COMPARISON:  09/09/2017 FINDINGS: Lower chest: Biatrial enlargement. Bibasilar pleural effusions moderate RIGHT and small LEFT. Bibasilar atelectasis. Hepatobiliary: Small cirrhotic liver. Post cholecystectomy. Low-attenuation lesion with peripheral calcification lateral segment LEFT lobe liver 2.0 x 1.9 cm not significantly changed. Pancreas: Atrophic pancreas without mass Spleen: Minimally enlarged, 15.8 x 12.9 x 6.8 cm (volume = 730 cm^3). No focal lesion. Adrenals/Urinary Tract: Unremarkable adrenal glands. Atrophic kidneys with minimal chronic nodularity. Persistent mid LEFT ureteral calculus 7 mm diameter. Bladder decompressed. No additional urinary tract calcification. Stomach/Bowel: Prior bowel resection with anastomotic staple line in RIGHT abdomen likely ileocecectomy. Mild diffuse colonic wall thickening versus artifact from underdistention. Additional staple line in the RIGHT upper quadrant at a small bowel loop. This loop demonstrates diffuse bowel wall thickening and is immediately caudal to the transverse colon. Multiple additional small bowel loops appear dilated and demonstrate diffuse wall thickening. Overall appearance is nonspecific. 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 Vascular/Lymphatic: Scattered atherosclerotic calcifications aorta and iliac arteries. Aorta normal caliber. Reproductive: Uterus surgically absent. Nonvisualization of ovaries. Other: Significant ascites unchanged.  No free air.  No hernia. Musculoskeletal: Osseous demineralization. IMPRESSION: Cirrhotic liver with significant ascites and  splenomegaly. Diffuse bowel wall thickening of large and small bowel loops throughout abdomen, present since 09/09/2017; differential diagnosis includes enteritis such as from infection or inflammatory bowel disease, portal enteropathy in the setting of cirrhosis, ischemia not excluded but considered less likely due to chronicity. Bibasilar effusions and atelectasis greater on RIGHT. Chronic mid LEFT ureteral calculus without hydronephrosis/hydroureter. Electronically Signed   By: Lavonia Dana M.D.   On: 12/13/2017 22:55    Cardiac Studies   2D Echo 11/2017 Study Conclusions  - Left ventricle: The cavity size was normal. Wall thickness was increased in a pattern of mild LVH. Systolic function was normal. The estimated ejection fraction was in the range of 55% to 60%. Wall motion was normal; there were no regional wall motion abnormalities. - Mitral valve: Calcified annulus. There was mild regurgitation. - Left atrium: The atrium was moderately dilated. - Right atrium: The atrium was moderately dilated. - Pulmonary arteries: Systolic pressure was mildly increased. PA peak pressure: 34 mm Hg (S).  Impressions:  - Normal LV function; mild LVH; mild MR; moderate biatrial enlargement; mild TR; mild pulmonary hypertension.    Patient Profile     This is a 77 YO woman, with history of persistent AF,previous cardiomyopathy most recent EF recovered, hypothyroidism, CKD, cirrhosis with varices, recent pulmonary embolism--she is being admitted for presumed urosepsis and cardiology was consulted for atrial fibrillation management.   Regarding her history, she was previously treated with flecainide although this was discontinued due to VT. Amiodarone also discontinued in the past due to cirrhosis and thyroid dysfunction. As an outpatient, there was a plan to initiate her on sotalol after being on anticoagulation for 3 weeks. However, this had been postponed due to a recent admission  for pulmonary embolism.The most recent strategy was rate control with 25 mg of metoprolol and anticoagulation with Eliquis (PE dosing initially).   On admit, HR was in the 140s. 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 Now on antibiotics for UTI.   Also with anemia and + FOBT  Tested + for C-diff   Assessment & Plan    1. Atrial Fibrillation w/ RVR: known persistent atrial fibrillation but admitted with RVR in the setting of urosepsis. Now on antibiotics. Suspect her rapid ventricular response is largely driven by urosepsis. Recommendations are to continue to treat urosepsis. Cardiology will continue with rate control management only. As outlined above, she is not a good candidate for AAD strategy. She was previously treated with flecainide although this was discontinued due to VT. Amiodarone also discontinued in the past due to cirrhosis and thyroid dysfunction. As an outpatient, there was a plan to initiate her on sotalol after being on anticoagulation for 3 weeks. However, this had been postponed due to a recent admission for pulmonary embolism.We will continue with metoprolol. Dose was increased on 7/8 to 25 mg BID and rates are better today, running in the low 100s-110s (130s yesterday). EF is normal, but she would not be able to tolerate addition of Cardizem at this time due to soft BP. We will continue with just metoprolol. She is on IV heparin for a/c. Will transition back to Eliquis once she is able to better tolerate PO.  2. PE/DVT: recent diagnosis. She was placed on Eliquis but this is now on hold and pt currently getting IV heparin, due to difficulty tolerating PO  3. Urosepsis: management per IM. Currently on antibiotics.   4. Anemia: drop in Hgb over the last week, from 14 to 10. FOBT + but also in the setting of recent hemorrhoidal bleeding. H/H has remained fairly stable in the last 24 hrs at 10.2, while still on IV heparin. IM will continue to monitor and will consult GI if needed.   5.  Hyperkalemia: K 5.5 today. She is having frequent diarrhea due to C-diff, so suspect K level to drop, but will still need to monitor closely.   C-Diff: stool +. Antibiotic Management per IM.   For questions or updates, please contact [place] Please consult [email or web address] for contact info under Cardiology/STEMI.      Signed, Lyda Jester, PA-C  12/15/2017, 8:00 AM    Attending Note:   The patient was seen and examined.  Agree with assessment and plan as noted above.  Changes made to the above note as needed.  Patient seen and independently examined with Ellen Henri, PA .   We discussed all aspects of the encounter. I agree with the assessment and plan as stated above.  1.  Atrial fibrillation with a rapid  ventricular response: She seems to be tolerating the metoprolol 25 mg twice a day.  Her heart rate is generally slower.  She still recovering from her urosepsis and now has developed some GI issues. Both of these issues will make maintaining a controlled ventricular response a challenge.     I have spent a total of 40 minutes with patient reviewing hospital  notes , telemetry, EKGs, labs and examining patient as well as establishing an assessment and plan that was discussed with the patient. > 50% of time was spent in direct patient care.    Thayer Headings, Brooke Bonito., MD, [facility] 12/15/2017, 10:05 AM [address]  [place] Pager [phone]

## 2017-12-15 NOTE — Consult Note (Addendum)
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 She has dark heme positive stools / Anemia. Slowly declining hgb on anticoagulation . Hgb down 3 grams since late June.  -INR quite elevated at 4.13. Vit K given yesterday and INR down to 2.9.  Repeat H+H pending.  -She had small varices on last EGD in 2013. On exam, patient incontinent of iquid black stool. She will need EGD this admission, likely tomorrow at noon with heparin being held 5 hours prior. The risks and benefits of EGD were discussed and the patient agrees to proceed.  -start IV PPI.  -already on Rocephin which should cover her for SBP prophylaxis  Sepsis, reason for admission. Blood Cx growing gram positive cocci in chains BUT urine cx >> Klebsiella Pneumonae (gram neg).  -If no clear cut source of sepsis then should get u/s to check for ascites and do diagnostic paracentesis but INR still up and on heparin gtt .  She is on rocephin.   C-diff infection. Mild colonic wall thickening on non-contrast CT scan, could be colitis. -On Vanco 125 mg QID -will add florastor bid  Recently diagnosed DVT / PE on Eliquis at home. Currently getting heparin gtt  AFIB with RVR. Rate improved. On Metoprolol  AKI, improving  Ureteral stone extraction early June    Hx of rectal adenoma, due for surveillance colonoscopy   HPI: Brittney Valentine is a 77 y.o. female with multiple medical problems. Hospitalized last month with LLE DVT and PE. She is knonw to [place] for hx of chronic diarrhea, rectal adenoma 2013 and cryptogenic cirrhosis complicated by portal hypertension   Enez was hospitalized late June with DVT / PE. Discharged home 6/30, Returned to ED two days ago with N/V and  weakness. Hypotensive and tachycardic in ED, lactate and WBC elevated. Blood cx  >>> gram + cocci in chains but urine cx + Klebsiella. Nurse noticed stools being black this admission.   [name] reports black stools started during recent hospitalization and continued at home. On Eliquis. No nsaids. She began having terrible diarrhea upon discharge. She began having N / V and eventually became very weak. Stool is positive for C-diff. No significant abdominal pain. No hematemesis.   Past Medical History:  Diagnosis Date  . Acute UTI   . Anemia   . Blood transfusion without reported diagnosis   . Cirrhosis ([place])   . Degenerative disk disease    knees  . Diverticulosis   . Dysrhythmia   . GERD (gastroesophageal reflux disease)   . Hemorrhoids   . History of kidney stones   . Hypertension   . Hypothyroidism   . Obesity   . Paroxysmal atrial fibrillation ([facility])   . Partial small bowel obstruction ([place])   . Personal history of colonic polyps 03/25/2012   8 mm rectal adenoma 03/2012  . Portal hypertensive gastropathy ([place])   . Shingles   . Thyroid disease   . UTI (urinary tract infection) 12/13/2017  . Varices, esophageal ([place])   . Zoster     Past Surgical History:  Procedure Laterality Date  . ABDOMINAL HYSTERECTOMY    . APPENDECTOMY  1991  . CHOLECYSTECTOMY  2000  . COLON SURGERY    . COLON SURGERY    .  COLONOSCOPY    . CYSTOSCOPY     10-26-17 budyzn  . CYSTOSCOPY/URETEROSCOPY/HOLMIUM LASER/STENT PLACEMENT Left 10/26/2017   Procedure: CYSTOSCOPY, URETEROSCOPY/RETROGRADE/STENT PLACEMENT;  Surgeon: Nickie Retort, MD;  Location: [facility];  Service: Urology;  Laterality: Left;  NEEDS DIGITAL URETEROSCOPE  . CYSTOSCOPY/URETEROSCOPY/HOLMIUM LASER/STENT PLACEMENT Left 11/12/2017   Procedure: CYSTOSCOPY LEFT RETROGRADE PYELOGRAM LEFT URETEROSCOPY HOLMIUM LASER AND LEFT URETERAL STENT EXCHANGE AND LASER ENDO URETEROTOMY;  Surgeon: Ardis Hughs, MD;  Location: [facility];  Service:  Urology;  Laterality: Left;  . ESOPHAGOGASTRODUODENOSCOPY  multiple    Prior to Admission medications   Medication Sig Start Date End Date Taking? Authorizing Provider  apixaban (ELIQUIS) 5 MG TABS tablet Take 1 tablet (5 mg total) by mouth 2 (two) times daily. 12/12/17  Yes Florencia Reasons, MD  Cholecalciferol (VITAMIN D-3) 1000 UNITS CAPS Take 1,000 Units by mouth 2 (two) times daily.    Yes [provider]  furosemide (LASIX) 20 MG tablet Take 20 mg by mouth 2 (two) times daily.   Yes [provider]  lactulose (CHRONULAC) 10 GM/15ML solution Take 15 mLs (10 g total) by mouth daily. 12/07/17  Yes Florencia Reasons, MD  levothyroxine (SYNTHROID) 150 MCG tablet Take 1 tablet (150 mcg total) by mouth daily. 12/06/17 12/06/18 Yes Florencia Reasons, MD  metoprolol succinate (TOPROL-XL) 25 MG 24 hr tablet Take 1 tablet (25 mg total) by mouth daily. 12/07/17  Yes Florencia Reasons, MD  ranitidine (ZANTAC) 300 MG tablet Take 1 tablet (300 mg total) at bedtime by mouth. Patient taking differently: Take 300 mg by mouth at bedtime as needed for heartburn.  04/22/17 04/22/18 Yes Vicie Mutters, PA-C  spironolactone (ALDACTONE) 100 MG tablet Take 1 tablet (100 mg total) by mouth daily. 12/06/17  Yes Florencia Reasons, MD  apixaban (ELIQUIS) 5 MG TABS tablet Take 2 tablets (10 mg total) by mouth 2 (two) times daily for 6 days. Patient not taking: Reported on 12/13/2017 12/06/17 12/13/17  Florencia Reasons, MD    Current Facility-Administered Medications  Medication Dose Route Frequency Provider Last Rate Last Dose  . 0.9 %  sodium chloride infusion   Intravenous Continuous Elodia Florence., MD 100 mL/hr at 12/15/17 [phone]    . acetaminophen (TYLENOL) tablet 650 mg  650 mg Oral Q6H PRN Rise Patience, MD       Or  . acetaminophen (TYLENOL) suppository 650 mg  650 mg Rectal Q6H PRN Rise Patience, MD      . cefTRIAXone (ROCEPHIN) 2 g in sodium chloride 0.9 % 100 mL IVPB  2 g Intravenous Q24H Rise Patience, MD   Stopped at  12/14/17 2234  . folic acid (FOLVITE) tablet 1 mg  1 mg Oral Daily Elodia Florence., MD   1 mg at 12/15/17 0904  . heparin ADULT infusion 100 units/mL (25000 units/258mL sodium chloride 0.45%)  1,150 Units/hr Intravenous Continuous Erenest Blank, RPH 11.5 mL/hr at 12/15/17 0614 1,150 Units/hr at 12/15/17 0614  . levothyroxine (SYNTHROID, LEVOTHROID) tablet 150 mcg  150 mcg Oral QAC breakfast Elodia Florence., MD   150 mcg at 12/15/17 0610  . metoprolol tartrate (LOPRESSOR) tablet 25 mg  25 mg Oral BID Lyda Jester M, PA-C   25 mg at 12/15/17 2536  . ondansetron (ZOFRAN) tablet 4 mg  4 mg Oral Q6H PRN Rise Patience, MD       Or  . ondansetron [facility]) injection 4 mg  4 mg Intravenous Q6H PRN Rise Patience,  MD   4 mg at 12/14/17 0852  . phytonadione (VITAMIN K) 1 mg in dextrose 5 % 50 mL IVPB  1 mg Intravenous Once Elodia Florence., MD   Stopped at 12/14/17 1003  . vancomycin (VANCOCIN) 50 mg/mL oral solution 125 mg  125 mg Oral QID Schorr, Rhetta Mura, NP   125 mg at 12/15/17 0905    Allergies as of 12/13/2017 - Review Complete 12/13/2017  Allergen Reaction Noted  . Amoxicillin Palpitations 11/25/2017  . Ciprofloxacin Swelling 05/17/2013  . Sulfonamide derivatives Other (See Comments) 12/31/2007  . Robaxin [methocarbamol] Palpitations 02/09/2014  . Verapamil Palpitations 02/09/2014    Family History  Problem Relation Age of Onset  . Heart failure Mother        died from  . Hypertension Mother   . Heart attack Father        died from  . Hypertension Sister     Social History   Socioeconomic History  . Marital status: Single    Spouse name: Not on file  . Number of children: Not on file  . Years of education: Not on file  . Highest education level: Not on file  Occupational History  . Not on file  Social Needs  . Financial resource strain: Not on file  . Food insecurity:    Worry: Not on file    Inability: Not on file  .  Transportation needs:    Medical: Not on file    Non-medical: Not on file  Tobacco Use  . Smoking status: Never Smoker  . Smokeless tobacco: Never Used  Substance and Sexual Activity  . Alcohol use: No  . Drug use: No  . Sexual activity: Not Currently    Partners: Male  Lifestyle  . Physical activity:    Days per week: Not on file    Minutes per session: Not on file  . Stress: Not on file  Relationships  . Social connections:    Talks on phone: Not on file    Gets together: Not on file    Attends religious service: Not on file    Active member of club or organization: Not on file    Attends meetings of clubs or organizations: Not on file    Relationship status: Not on file  . Intimate partner violence:    Fear of current or ex partner: Not on file    Emotionally abused: Not on file    Physically abused: Not on file    Forced sexual activity: Not on file  Other Topics Concern  . Not on file  Social History Narrative  . Not on file    Review of Systems: All systems reviewed and negative except where noted in HPI.  Physical Exam: Vital signs in last 24 hours: Temp:  [97.6 F (36.4 C)-98.4 F (36.9 C)] 97.6 F (36.4 C) (07/09 0811) Pulse Rate:  [99-108] 106 (07/09 0811) Resp:  [16-23] 19 (07/09 0811) BP: (89-111)/(49-70) 104/52 (07/09 0811) SpO2:  [94 %-96 %] 95 % (07/09 0811) Weight:  [225 lb 15.5 oz (102.5 kg)] 225 lb 15.5 oz (102.5 kg) (07/09 0600) Last BM Date: 12/14/17 General:   Pleasant obese female in NAD Psych:  Pleasant, cooperative. Normal mood and affect. Eyes:  Pupils equal, sclera clear, no icterus.   Conjunctiva pink. Ears:  Slightly diminished hearing. . Nose:  No deformity, discharge,  or lesions. Neck:  Supple; no masses Lungs:  Clear throughout to auscultation.  Marland Kitchen  Heart:  Irregular  rate and rhythm;  Abdomen:  Soft, non-distended, nontender, BS active, no palp mass    Rectal:  Incontinent of liquid black stool  Msk:  Symmetrical without gross  deformities. . Neurologic:  Alert and  oriented x4;  grossly normal neurologically. Skin:  Intact without significant lesions or rashes..   Intake/Output from previous day: 07/08 0701 - 07/09 0700 In: 3367.7 [P.O.:240; I.V.:2135.6; IV Piggyback:992.1] Out: 670 [Urine:670] Intake/Output this shift: No intake/output data recorded.  Lab Results: Recent Labs    12/14/17 0338 12/14/17 0749 12/14/17 1634 12/15/17 0333  WBC 23.7* 24.6*  --  18.2*  HGB 10.7* 10.8* 10.8* 10.2*  HCT 32.2* 32.0* 32.5* 32.0*  PLT 379 365  --  375   BMET Recent Labs    12/14/17 0338 12/14/17 1634 12/15/17 0333 12/15/17 0807  NA 139 136 135  --   K 4.8 4.3 5.5* 4.2  CL 113* 107 111  --   CO2 21* 20* 23  --   GLUCOSE 105* 112* 81  --   BUN 50* 53* 51*  --   CREATININE 1.63* 1.62* 1.50*  --   CALCIUM 9.3 9.2 8.8*  --    LFT Recent Labs    12/15/17 0333  PROT 4.4*  ALBUMIN 1.7*  AST 83*  ALT 28  ALKPHOS 65  BILITOT 1.7*  BILIDIR 0.6*  IBILI 1.1*   PT/INR Recent Labs    12/13/17 2259 12/15/17 0333  LABPROT 39.7* 30.1*  INR 4.13* 2.90    Studies/Results: Dg Chest 2 View  Result Date: 12/13/2017 CLINICAL DATA:  Hypotension today. EXAM: CHEST - 2 VIEW COMPARISON:  CT chest 12/04/2017. The single view of the chest 04/14/2017. FINDINGS: There small to moderate bilateral pleural effusions and basilar atelectasis. Cardiomegaly without edema is identified. No pneumothorax. Aortic atherosclerosis is seen. No acute bony abnormality. IMPRESSION: Small to moderate bilateral pleural effusions and basilar atelectasis, greater on the right. Cardiomegaly without edema. Atherosclerosis. Electronically Signed   By: Inge Rise M.D.   On: 12/13/2017 15:46   Ct Renal Stone Study  Result Date: 12/13/2017 CLINICAL DATA:  Nausea, vomiting, weakness, history cirrhosis, GERD, kidney stones, hypertension EXAM: CT ABDOMEN AND PELVIS WITHOUT CONTRAST TECHNIQUE: Multidetector CT imaging of the abdomen and  pelvis was performed following the standard protocol without IV contrast. Sagittal and coronal MPR images reconstructed from axial data set. Oral contrast was not administered. COMPARISON:  09/09/2017 FINDINGS: Lower chest: Biatrial enlargement. Bibasilar pleural effusions moderate RIGHT and small LEFT. Bibasilar atelectasis. Hepatobiliary: Small cirrhotic liver. Post cholecystectomy. Low-attenuation lesion with peripheral calcification lateral segment LEFT lobe liver 2.0 x 1.9 cm not significantly changed. Pancreas: Atrophic pancreas without mass Spleen: Minimally enlarged, 15.8 x 12.9 x 6.8 cm (volume = 730 cm^3). No focal lesion. Adrenals/Urinary Tract: Unremarkable adrenal glands. Atrophic kidneys with minimal chronic nodularity. Persistent mid LEFT ureteral calculus 7 mm diameter. Bladder decompressed. No additional urinary tract calcification. Stomach/Bowel: Prior bowel resection with anastomotic staple line in RIGHT abdomen likely ileocecectomy. Mild diffuse colonic wall thickening versus artifact from underdistention. Additional staple line in the RIGHT upper quadrant at a small bowel loop. This loop demonstrates diffuse bowel wall thickening and is immediately caudal to the transverse colon. Multiple additional small bowel loops appear dilated and demonstrate diffuse wall thickening. Overall appearance is nonspecific. 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 Vascular/Lymphatic:  Scattered atherosclerotic calcifications aorta and iliac arteries. Aorta normal caliber. Reproductive: Uterus surgically absent. Nonvisualization of ovaries. Other: Significant ascites unchanged.  No free air.  No hernia. Musculoskeletal: Osseous demineralization. IMPRESSION: Cirrhotic liver with significant ascites and splenomegaly.  Diffuse bowel wall thickening of large and small bowel loops throughout abdomen, present since 09/09/2017; differential diagnosis includes enteritis such as from infection or inflammatory bowel disease, portal enteropathy in the setting of cirrhosis, ischemia not excluded but considered less likely due to chronicity. Bibasilar effusions and atelectasis greater on RIGHT. Chronic mid LEFT ureteral calculus without hydronephrosis/hydroureter. Electronically Signed   By: Lavonia Dana M.D.   On: 12/13/2017 22:55     Tye Savoy, NP-C @  12/15/2017, 12:53 PM   Attending physician's note   I have taken a history, examined the patient and reviewed the chart. I agree with the Advanced Practitioner's note, impression and recommendations. 30 yr F with cirrhosis, NASH, C.diff colitis and bacteremia (Gram positive cocci), having dark liquid diarrhea.  Patient had any episode of incontinent bowel movement while I was in the room, appeared dark green not melenic, heme positive. Hgb drop likely multifactorial.  INR 2.9, on heparin gtt Plan for diagnostic egd tomorrow. Prefer INR below 2, around 1.5. Will recheck in the AM.  Will hold off reversing INR unless has active GI hemorrhage with hemodynamic instability Continue PO Vancomycin The risks and benefits as well as alternatives of endoscopic procedure(s) have been discussed and reviewed. All questions answered. The patient agrees to proceed.    Damaris Hippo , MD [phone]

## 2017-12-15 NOTE — Progress Notes (Signed)
PTT > 200 Pharm .made notified see orders.

## 2017-12-16 ENCOUNTER — Encounter (HOSPITAL_COMMUNITY): Payer: Self-pay | Admitting: *Deleted

## 2017-12-16 ENCOUNTER — Inpatient Hospital Stay (HOSPITAL_COMMUNITY): Payer: PPO | Admitting: Anesthesiology

## 2017-12-16 ENCOUNTER — Encounter (HOSPITAL_COMMUNITY)
Admission: EM | Disposition: A | Discharge status: Skilled Nursing Facility | Payer: Self-pay | Source: Home / Self Care | Attending: Family Medicine

## 2017-12-16 DIAGNOSIS — A408 Other streptococcal sepsis: Secondary | ICD-10-CM

## 2017-12-16 DIAGNOSIS — K766 Portal hypertension: Secondary | ICD-10-CM

## 2017-12-16 DIAGNOSIS — K3189 Other diseases of stomach and duodenum: Secondary | ICD-10-CM

## 2017-12-16 DIAGNOSIS — D689 Coagulation defect, unspecified: Secondary | ICD-10-CM

## 2017-12-16 HISTORY — PX: ESOPHAGOGASTRODUODENOSCOPY: SHX5428

## 2017-12-16 LAB — HEPATIC FUNCTION PANEL
ALT: 33 U/L (ref 0–44)
AST: 56 U/L — ABNORMAL HIGH (ref 15–41)
Albumin: 1.6 g/dL — ABNORMAL LOW (ref 3.5–5.0)
Alkaline Phosphatase: 71 U/L (ref 38–126)
BILIRUBIN DIRECT: 0.4 mg/dL — AB (ref 0.0–0.2)
BILIRUBIN INDIRECT: 0.6 mg/dL (ref 0.3–0.9)
BILIRUBIN TOTAL: 1 mg/dL (ref 0.3–1.2)
Total Protein: 4.5 g/dL — ABNORMAL LOW (ref 6.5–8.1)

## 2017-12-16 LAB — CULTURE, BLOOD (ROUTINE X 2)
Special Requests: ADEQUATE
Special Requests: ADEQUATE

## 2017-12-16 LAB — BASIC METABOLIC PANEL
ANION GAP: 5 (ref 5–15)
Anion gap: 1 — ABNORMAL LOW (ref 5–15)
BUN: 51 mg/dL — AB (ref 8–23)
BUN: 46 mg/dL — ABNORMAL HIGH (ref 8–23)
CALCIUM: 8.8 mg/dL — AB (ref 8.9–10.3)
CHLORIDE: 111 mmol/L (ref 98–111)
CO2: 23 mmol/L (ref 22–32)
CO2: 19 mmol/L — AB (ref 22–32)
Calcium: 8.8 mg/dL — ABNORMAL LOW (ref 8.9–10.3)
Chloride: 114 mmol/L — ABNORMAL HIGH (ref 98–111)
Creatinine, Ser: 1.5 mg/dL — ABNORMAL HIGH (ref 0.44–1.00)
Creatinine, Ser: 1.32 mg/dL — ABNORMAL HIGH (ref 0.44–1.00)
GFR calc Af Amer: 38 mL/min — ABNORMAL LOW (ref >60)
GFR calc Af Amer: 45 mL/min — ABNORMAL LOW (ref >60)
GFR calc non Af Amer: 33 mL/min — ABNORMAL LOW (ref >60)
GFR, EST NON AFRICAN AMERICAN: 39 mL/min — AB (ref >60)
GLUCOSE: 81 mg/dL (ref 70–99)
GLUCOSE: 95 mg/dL (ref 70–99)
POTASSIUM: 5.5 mmol/L — AB (ref 3.5–5.1)
Potassium: 4.1 mmol/L (ref 3.5–5.1)
Sodium: 135 mmol/L (ref 135–145)
Sodium: 138 mmol/L (ref 135–145)

## 2017-12-16 LAB — FIBRINOGEN: Fibrinogen: 286 mg/dL (ref 210–475)

## 2017-12-16 LAB — HEPARIN LEVEL (UNFRACTIONATED)
Heparin Unfractionated: 0.91 IU/mL — ABNORMAL HIGH (ref 0.30–0.70)
Heparin Unfractionated: 0.19 IU/mL — ABNORMAL LOW (ref 0.30–0.70)

## 2017-12-16 LAB — CBC
HEMATOCRIT: 32.1 % — AB (ref 36.0–46.0)
Hemoglobin: 10.6 g/dL — ABNORMAL LOW (ref 12.0–15.0)
MCH: 30.5 pg (ref 26.0–34.0)
MCHC: 33 g/dL (ref 30.0–36.0)
MCV: 92.5 fL (ref 78.0–100.0)
Platelets: 370 10*3/uL (ref 150–400)
RBC: 3.47 MIL/uL — ABNORMAL LOW (ref 3.87–5.11)
RDW: 24.1 % — AB (ref 11.5–15.5)
WBC: 13.9 10*3/uL — AB (ref 4.0–10.5)

## 2017-12-16 LAB — PROTIME-INR
INR: 1.77
PROTHROMBIN TIME: 20.5 s — AB (ref 11.4–15.2)

## 2017-12-16 LAB — MAGNESIUM: MAGNESIUM: 2 mg/dL (ref 1.7–2.4)

## 2017-12-16 LAB — APTT: aPTT: 148 seconds — ABNORMAL HIGH (ref 24–36)

## 2017-12-16 SURGERY — EGD (ESOPHAGOGASTRODUODENOSCOPY)
Anesthesia: Monitor Anesthesia Care

## 2017-12-16 MED ORDER — HEPARIN (PORCINE) IN NACL 100-0.45 UNIT/ML-% IJ SOLN
1250.0000 [IU]/h | INTRAMUSCULAR | Status: DC
  Administered 2017-12-16: 1100 [IU]/h via INTRAVENOUS
  Filled 2017-12-16: qty 250

## 2017-12-16 MED ORDER — PROPOFOL 500 MG/50ML IV EMUL
INTRAVENOUS | Status: DC | PRN
  Administered 2017-12-16: 75 ug/kg/min via INTRAVENOUS

## 2017-12-16 MED ORDER — PHENYLEPHRINE HCL 10 MG/ML IJ SOLN
INTRAMUSCULAR | Status: DC | PRN
  Administered 2017-12-16: 80 ug via INTRAVENOUS

## 2017-12-16 MED ORDER — SACCHAROMYCES BOULARDII 250 MG PO CAPS
250.0000 mg | ORAL_CAPSULE | Freq: Two times a day (BID) | ORAL | Status: DC
  Administered 2017-12-16 – 2017-12-24 (×16): 250 mg via ORAL
  Filled 2017-12-16 (×16): qty 1

## 2017-12-16 MED ORDER — SODIUM CHLORIDE 0.9 % IV SOLN
INTRAVENOUS | Status: DC | PRN
  Administered 2017-12-16: 14:00:00 via INTRAVENOUS

## 2017-12-16 MED ORDER — PHYTONADIONE 5 MG PO TABS
5.0000 mg | ORAL_TABLET | Freq: Every day | ORAL | Status: AC
  Administered 2017-12-17 – 2017-12-20 (×4): 5 mg via ORAL
  Filled 2017-12-16 (×4): qty 1

## 2017-12-16 MED ORDER — ONDANSETRON HCL 4 MG/2ML IJ SOLN
INTRAMUSCULAR | Status: DC | PRN
  Administered 2017-12-16: 4 mg via INTRAVENOUS

## 2017-12-16 NOTE — Plan of Care (Signed)
  Problem: Education: Goal: Knowledge of General Education information will improve Outcome: Progressing   Problem: Health Behavior/Discharge Planning: Goal: Ability to manage health-related needs will improve Outcome: Progressing   Problem: Clinical Measurements: Goal: Ability to maintain clinical measurements within normal limits will improve Outcome: Progressing   

## 2017-12-16 NOTE — Interval H&P Note (Signed)
History and Physical Interval Note:  12/16/2017 12:31 PM  Brittney Valentine  has presented today for surgery, with the diagnosis of gastrointestinal bleeding  The various methods of treatment have been discussed with the patient and family. After consideration of risks, benefits and other options for treatment, the patient has consented to  Procedure(s): ESOPHAGOGASTRODUODENOSCOPY (EGD) (N/A) as a surgical intervention .  The patient's history has been reviewed, patient examined, no change in status, stable for surgery.  I have reviewed the patient's chart and labs.  Questions were answered to the patient's satisfaction.     Kavitha Nandigam

## 2017-12-16 NOTE — Anesthesia Procedure Notes (Signed)
Procedure Name: MAC Date/Time: 12/16/2017 2:06 PM Performed by: Neldon Newport, CRNA Pre-anesthesia Checklist: Timeout performed, Patient being monitored, Suction available, Emergency Drugs available and Patient identified Oxygen Delivery Method: Nasal cannula

## 2017-12-16 NOTE — Progress Notes (Signed)
ANTICOAGULATION CONSULT NOTE - Follow-up Consult  Pharmacy Consult for Heparin Indication: pulmonary embolus  Patient Measurements: Height: 5\' 2"  (157.5 cm) Weight: 228 lb 13.4 oz (103.8 kg) IBW/kg (Calculated) : 50.1 Heparin Dosing Weight: 73.7 kg  Vital Signs: Temp: 97.4 F (36.3 C) (07/10 2316) Temp Source: Oral (07/10 2316) BP: 104/69 (07/10 2316) Pulse Rate: 103 (07/10 2030)  Labs: Recent Labs    12/14/17 0338  12/14/17 0749 12/14/17 1634 12/15/17 0333 12/15/17 1404 12/15/17 1525 12/15/17 2200 12/16/17 0258 12/16/17 2201  HGB 10.7*  --  10.8* 10.8* 10.2* 10.0*  --  10.7* 10.6*  --   HCT 32.2*  --  32.0* 32.5* 32.0* 31.2*  --  31.7* 32.1*  --   PLT 379  --  365  --  375  --   --   --  370  --   APTT  --    < >  --  59* >200*  --  46*  --  148*  --   LABPROT  --   --   --   --  30.1*  --   --   --  20.5*  --   INR  --   --   --   --  2.90  --   --   --  1.77  --   HEPARINUNFRC  --    < >  --   --  >2.20*  --  1.16*  --  0.91* 0.19*  CREATININE 1.63*  --   --  1.62* 1.50*  --   --   --  1.32*  --   TROPONINI 0.11*  --  0.10*  --   --   --   --   --   --   --    < > = values in this interval not displayed.    Estimated Creatinine Clearance: 42.3 mL/min (A) (by C-G formula based on SCr of 1.32 mg/dL (H)).   Medications:  Scheduled:  . folic acid  1 mg Oral Daily  . levothyroxine  150 mcg Oral QAC breakfast  . metoprolol tartrate  25 mg Oral BID  . [START ON 12/17/2017] phytonadione  5 mg Oral Daily  . saccharomyces boulardii  250 mg Oral BID  . vancomycin  125 mg Oral QID    Assessment: 71 YOF admitted with PE. Currently on Eliquis for Afib/PE/DVT per MD note. Last dose of Eliquis 7/6 1900. Pharmacy consulted to dose heparin for PE.  She is now s/p upper GI endoscopy with esophageal varices. Heparin was restarted post procedure at ~ 2:30pm  Heparin level this evening resulted as SUBtherapeutic (HL 0.19). No issues with the drip or bleeding noted per RN.    Goal of Therapy:  Heparin level 0.3-0.7 units/ml aPTT 66-102 seconds Monitor platelets by anticoagulation protocol: Yes   Plan:  - Increase Heparin to 1250 units/hr (12.5 ml/hr) - Will continue to monitor for any signs/symptoms of bleeding and will follow up with heparin level in 8 hours   Thank you for allowing pharmacy to be a part of this patient's care.  Alycia Rossetti, PharmD, BCPS Clinical Pharmacist Pager: [phone] If after 3:30p, please call main pharmacy at: [phone] 12/16/2017 11:56 PM

## 2017-12-16 NOTE — Progress Notes (Signed)
ANTICOAGULATION CONSULT NOTE - Follow-up Consult  Pharmacy Consult for Heparin Indication: pulmonary embolus  Patient Measurements: Height: 5\' 2"  (157.5 cm) Weight: 225 lb 15.5 oz (102.5 kg) IBW/kg (Calculated) : 50.1 Heparin Dosing Weight: 73.7 kg  Vital Signs: Temp: 97.4 F (36.3 C) (07/09 2300) Temp Source: Oral (07/09 2300) BP: 103/60 (07/09 2300) Pulse Rate: 89 (07/09 2300)  Labs: Recent Labs    12/13/17 2259 12/14/17 0338  12/14/17 0749 12/14/17 1634 12/15/17 0333 12/15/17 1404 12/15/17 1525 12/15/17 2200 12/16/17 0258  HGB  --  10.7*  --  10.8* 10.8* 10.2* 10.0*  --  10.7* 10.6*  HCT  --  32.2*  --  32.0* 32.5* 32.0* 31.2*  --  31.7* 32.1*  PLT  --  379  --  365  --  375  --   --   --  370  APTT 43*  --    < >  --  59* >200*  --  46*  --  148*  LABPROT 39.7*  --   --   --   --  30.1*  --   --   --  20.5*  INR 4.13*  --   --   --   --  2.90  --   --   --  1.77  HEPARINUNFRC  --   --    < >  --   --  >2.20*  --  1.16*  --  0.91*  CREATININE  --  1.63*  --   --  1.62* 1.50*  --   --   --  1.32*  TROPONINI  --  0.11*  --  0.10*  --   --   --   --   --   --    < > = values in this interval not displayed.    Estimated Creatinine Clearance: 42 mL/min (A) (by C-G formula based on SCr of 1.32 mg/dL (H)).   Medications:  Scheduled:  . folic acid  1 mg Oral Daily  . levothyroxine  150 mcg Oral QAC breakfast  . metoprolol tartrate  25 mg Oral BID  . vancomycin  125 mg Oral QID    Assessment: 30 YOF admitted with PE. Currently on Eliquis for Afib/PE/DVT per MD note. Last dose of Eliquis 7/6 1900. Pharmacy consulted to dose heparin for PE.  aPTT this morning is SUPRAtherapeutic (aPTT 148, goal of 66-102). CBC stable - no overt bleeding noted. Noted plans to hold Heparin at 0700 for Endoscopy.   Goal of Therapy:  Heparin level 0.3-0.7 units/ml aPTT 66-102 seconds Monitor platelets by anticoagulation protocol: Yes   Plan:  - Reduce Heparin to 1100 units/hr (11  ml/hr) - Heparin with stop time of 0700 for Endoscopy - Will follow-up plans for anticoagulation post-procedure  Thank you for allowing pharmacy to be a part of this patient's care.  Alycia Rossetti, PharmD, BCPS Clinical Pharmacist Pager: [phone] If after 3:30p, please call main pharmacy at: [phone] 12/16/2017 4:11 AM

## 2017-12-16 NOTE — Progress Notes (Addendum)
ANTICOAGULATION CONSULT NOTE - Follow-up Consult  Pharmacy Consult for Heparin Indication: pulmonary embolus  Patient Measurements: Height: 5\' 2"  (157.5 cm) Weight: 228 lb 13.4 oz (103.8 kg) IBW/kg (Calculated) : 50.1 Heparin Dosing Weight: 73.7 kg  Vital Signs: Temp: 97.7 F (36.5 C) (07/10 1424) Temp Source: Oral (07/10 1424) BP: 113/87 (07/10 1700) Pulse Rate: 87 (07/10 1700)  Labs: Recent Labs    12/13/17 2259  12/14/17 0338  12/14/17 0749 12/14/17 1634 12/15/17 0333 12/15/17 1404 12/15/17 1525 12/15/17 2200 12/16/17 0258  HGB  --    < > 10.7*  --  10.8* 10.8* 10.2* 10.0*  --  10.7* 10.6*  HCT  --    < > 32.2*  --  32.0* 32.5* 32.0* 31.2*  --  31.7* 32.1*  PLT  --    < > 379  --  365  --  375  --   --   --  370  APTT 43*  --   --    < >  --  59* >200*  --  46*  --  148*  LABPROT 39.7*  --   --   --   --   --  30.1*  --   --   --  20.5*  INR 4.13*  --   --   --   --   --  2.90  --   --   --  1.77  HEPARINUNFRC  --   --   --    < >  --   --  >2.20*  --  1.16*  --  0.91*  CREATININE  --    < > 1.63*  --   --  1.62* 1.50*  --   --   --  1.32*  TROPONINI  --   --  0.11*  --  0.10*  --   --   --   --   --   --    < > = values in this interval not displayed.    Estimated Creatinine Clearance: 42.3 mL/min (A) (by C-G formula based on SCr of 1.32 mg/dL (H)).   Medications:  Scheduled:  . folic acid  1 mg Oral Daily  . levothyroxine  150 mcg Oral QAC breakfast  . metoprolol tartrate  25 mg Oral BID  . [START ON 12/17/2017] phytonadione  5 mg Oral Daily  . saccharomyces boulardii  250 mg Oral BID  . vancomycin  125 mg Oral QID    Assessment: 7 YOF admitted with PE. Currently on Eliquis for Afib/PE/DVT per MD note. Last dose of Eliquis 7/6 1900. Pharmacy consulted to dose heparin for PE.  She is now s/p upper GI endoscopy with esophageal varices. Heparin was restarted post procedure at ~ 2:30pm  Goal of Therapy:  Heparin level 0.3-0.7 units/ml aPTT 66-102  seconds Monitor platelets by anticoagulation protocol: Yes   Plan:  -Continue heparin at 1100 units/hr -heparin level in 8 hrs after restart -Heparin level and CBC daily  Thank you for allowing pharmacy to be a part of this patient's care. Hildred Laser, PharmD Clinical Pharmacist Please check Amion for pharmacy contact number 12/16/2017 5:58 PM

## 2017-12-16 NOTE — Progress Notes (Signed)
Patient off floor to Endo.   

## 2017-12-16 NOTE — Anesthesia Preprocedure Evaluation (Addendum)
Anesthesia Evaluation  Patient identified by MRN, date of birth, ID band Patient awake    Reviewed: Allergy & Precautions, NPO status , Patient's Chart, lab work & pertinent test results  Airway Mallampati: III  TM Distance: >3 FB Neck ROM: Full    Dental  (+) Dental Advidsory Given, Caps,    Pulmonary PE   Pulmonary exam normal breath sounds clear to auscultation       Cardiovascular hypertension, Pt. on home beta blockers +CHF and + DVT  Normal cardiovascular exam+ dysrhythmias Atrial Fibrillation  Rhythm:Regular Rate:Normal  ECG: A-fib, LBBB, LAD  ECHO: Normal LV function; mild LVH; mild MR; moderate biatrial enlargement; mild TR; mild pulmonary hypertension.   Sees cardiologist (Allred/Hochrein)    Neuro/Psych negative neurological ROS  negative psych ROS   GI/Hepatic GERD  Medicated and Controlled,(+) Cirrhosis   Esophageal Varices    , Varices, esophageal   Endo/Other  Hypothyroidism Morbid obesity  Renal/GU negative Renal ROS     Musculoskeletal negative musculoskeletal ROS (+)   Abdominal (+) + obese,   Peds  Hematology  (+) anemia ,   Anesthesia Other Findings gastrointestinal bleeding  Reproductive/Obstetrics                           Anesthesia Physical Anesthesia Plan  ASA: IV  Anesthesia Plan: MAC   Post-op Pain Management:    Induction: Intravenous  PONV Risk Score and Plan: 2 and Propofol infusion and Treatment may vary due to age or medical condition  Airway Management Planned: Natural Airway  Additional Equipment:   Intra-op Plan:   Post-operative Plan:   Informed Consent: I have reviewed the patients History and Physical, chart, labs and discussed the procedure including the risks, benefits and alternatives for the proposed anesthesia with the patient or authorized representative who has indicated his/her understanding and acceptance.   Dental  advisory given and Dental Advisory Given  Plan Discussed with: CRNA  Anesthesia Plan Comments:       Anesthesia Quick Evaluation

## 2017-12-16 NOTE — Anesthesia Postprocedure Evaluation (Signed)
Anesthesia Post Note  Patient: Brittney Valentine  Procedure(s) Performed: ESOPHAGOGASTRODUODENOSCOPY (EGD) (N/A )     Patient location during evaluation: PACU Anesthesia Type: MAC Level of consciousness: awake and alert Pain management: pain level controlled Vital Signs Assessment: post-procedure vital signs reviewed and stable Respiratory status: spontaneous breathing, nonlabored ventilation, respiratory function stable and patient connected to nasal cannula oxygen Cardiovascular status: stable and blood pressure returned to baseline Postop Assessment: no apparent nausea or vomiting Anesthetic complications: no    Last Vitals:  Vitals:   12/16/17 1430 12/16/17 1500  BP: 111/63 102/61  Pulse: 95 88  Resp: 18 19  Temp:    SpO2: 100% 97%    Last Pain:  Vitals:   12/16/17 1438  TempSrc:   PainSc: 0-No pain                 Ryan P Ellender

## 2017-12-16 NOTE — Progress Notes (Signed)
PROGRESS NOTE  Brittney Valentine TZG:[id number] DOB: 1940-07-20 DOA: 12/13/2017 PCP: Unk Pinto, MD  Brief Narrative: 77 year old woman PMH cirrhosis, atrial fibrillation, PE, DVT, presented with weakness, nausea, vomiting.  Admitted for suspected sepsis secondary to UTI, atrial fibrillation with rapid ventricular response  Discharge 6/30, treated for bilateral PE, left lower extremity DVT in the context of recent kidney stone removal, patient off Eliquis in anticipation of colonoscopy and EGD.  Assessment/Plan Sepsis secondary to Klebsiella, Enterococcus faecalis UTI --Afebrile, vital signs stable.  Continue ceftriaxone; will f/u sensitivities but given clinical improvement will not add vanc at this point.  Strep viridans bacteremia  --Afebrile, vital signs stable.  Continue ceftriaxone.  C diff colitis --7 stools yesterday.  Continue PO vanc  Atrial fibrillation with rapid ventricular response --Continue beta-blocker.  Avoid amiodarone given hypothyroidism, elevated LFTs per cardiology. --Resume Eliquis when okay with GI  Anemia acute on chronic; folate deficiency.  Fecal occult blood positive. --continue folate. Hgb stable. Endoscopy noted, EGD without evidence of bleeding.  Acute kidney injury --improving  NASH cirrhosis, esophageal varices, ascites, splenomegaly, coagulopathy/elevated INR --per oncology: continue vit K 5mg  po daily for 1-2 weeks   Atrial fibrillation, PMH ventricular tachycardia --Cardiology has recommended rate control strategy, not a candidate for amiodarone secondary to liver disease, has had ventricular tachycardia flecainide. --resume apixaban per oncology   Chronic combined diastolic, systolic CHF --Appears stable.  Mild peripheral edema noted.  Hypothyroidism, abnormal TSH, follow-up as an outpatient  Recent dx PE/DVT --Heparin.  Resume apixaban when okay with GI.  DVT prophylaxis: heparin Code Status: partial Family Communication:  none Disposition Plan: pending    [name], MD  [facility] Direct contact: [phone] --Via amion app OR  --[email or web address]; password [email or web address]  7PM-7AM contact night coverage as above 12/16/2017, 3:17 PM  LOS: 3 days   Consultants:  Cardiology  GI  Hematology   Procedures:  EGD Impression:               - Grade II esophageal varices.                           - Esophagogastric landmarks identified.                           - Portal hypertensive gastropathy.                           - Normal examined duodenum.                           - No specimens collected.  Antimicrobials:  Ceftriaxone  Vancomycin  Interval history/Subjective: Feels fine, sleepy, no complaints.  No pain.  Breathing fine.  Objective: Vitals:  Vitals:   12/16/17 1430 12/16/17 1500  BP: 111/63 102/61  Pulse: 95 88  Resp: 18 19  Temp:    SpO2: 100% 97%    Exam:  Constitutional:  . Appears calm and comfortable Eyes:  . pupils appear normal . Normal lids  ENMT:  . grossly normal hearing  . Lips appear normal Respiratory:  . CTA bilaterally, no w/r/r.  . Respiratory effort normal.  Cardiovascular:  . RRR, no m/r/g . 2+ bilateral LE extremity edema   Abdomen:  . Obese, soft, nontender Skin:  . No rashes, lesions, ulcers Psychiatric:  . Mental status o Mood, affect appropriate . judgment  and insight appear intact     I have personally reviewed the following:   Labs:  Potassium within normal limits.  BUN, creatinine trending down.  46/1.32.  Magnesium within normal limits.  Hemoglobin stable 10.6.   Scheduled Meds: . folic acid  1 mg Oral Daily  . levothyroxine  150 mcg Oral QAC breakfast  . metoprolol tartrate  25 mg Oral BID  . saccharomyces boulardii  250 mg Oral BID  . vancomycin  125 mg Oral QID   Continuous Infusions: . cefTRIAXone (ROCEPHIN)  IV Stopped (12/15/17 2203)  . pantoprozole (PROTONIX) infusion 8 mg/hr (12/16/17 1503)  . phytonadione  (VITAMIN K) IV Stopped (12/14/17 1003)    Principal Problem:   Sepsis ([place]) Active Problems:   Hepatic cirrhosis (HCC)   Esophageal varices (HCC)   Atrial fibrillation with RVR (HCC)   Hypothyroidism   Obstructive sleep apnea   Acute lower UTI   DVT (deep venous thrombosis) (HCC)   PE (pulmonary thromboembolism) (HCC)   Bacteremia   Heme positive stool   Clostridium difficile colitis   Diarrhea of infectious origin   Coagulopathy ([place])   LOS: 3 days

## 2017-12-16 NOTE — Progress Notes (Signed)
Patient returned from Endo in NAD. VS stable and patient free from pain.

## 2017-12-16 NOTE — Op Note (Signed)
[facility] Patient Name: Brittney Valentine Procedure Date : 12/16/2017 MRN: [id number] Attending MD: Mauri Pole , MD Date of Birth: [birth year] CSN: [id number] Age: 77 Admit Type: Inpatient Procedure:                Upper GI endoscopy Indications:              Recent gastrointestinal bleeding, Suspected upper                            gastrointestinal bleeding, Gastrointestinal                            bleeding of unknown origin Providers:                Mauri Pole, MDLisa Marissa Calamity, RN, Laurena Spies, Technician Referring MD:              Medicines:                Monitored Anesthesia Care Complications:            No immediate complications. Estimated Blood Loss:     Estimated blood loss: none. Procedure:                Pre-Anesthesia Assessment:                           - Prior to the procedure, a History and Physical                            was performed, and patient medications and                            allergies were reviewed. The patient's tolerance of                            previous anesthesia was also reviewed. The risks                            and benefits of the procedure and the sedation                            options and risks were discussed with the patient.                            All questions were answered, and informed consent                            was obtained. Prior Anticoagulants: The patient                            last took heparin on the day of the procedure. ASA                            Grade Assessment:  III - A patient with severe                            systemic disease. After reviewing the risks and                            benefits, the patient was deemed in satisfactory                            condition to undergo the procedure.                           After obtaining informed consent, the endoscope was                            passed under direct vision.  Throughout the                            procedure, the patient's blood pressure, pulse, and                            oxygen saturations were monitored continuously. The                            EG-[id number] ([id number]) scope was introduced through the                            mouth, and advanced to the second part of duodenum.                            The upper GI endoscopy was accomplished without                            difficulty. The patient tolerated the procedure                            well. Scope In: Scope Out: Findings:      Grade II varices were found in the middle third of the esophagus and in       the lower third of the esophagus. They were less than 5 mm in largest       diameter.      Esophagogastric landmarks were identified: the Z-line was found at 40 cm       from the incisors.      Mild portal hypertensive gastropathy was found in the entire examined       stomach.      The examined duodenum was normal. Impression:               - Grade II esophageal varices.                           - Esophagogastric landmarks identified.                           - Portal hypertensive gastropathy.                           -  Normal examined duodenum.                           - No specimens collected. Recommendation:           - Patient has a contact number available for                            emergencies. The signs and symptoms of potential                            delayed complications were discussed with the                            patient. Return to normal activities tomorrow.                            Written discharge instructions were provided to the                            patient.                           - Resume previous diet.                           - Continue present medications.                           - Resume heparin at prior dose today. Refer to                            managing physician for further adjustment of                             therapy.                           - Non selective beta blocker (Propranolol 20mg  BID)                            and titrate dose to goal HR <60 Procedure Code(s):        --- Professional ---                           [phone], Esophagogastroduodenoscopy, flexible,                            transoral; diagnostic, including collection of                            specimen(s) by brushing or washing, when performed                            (separate procedure) Diagnosis Code(s):        --- Professional ---  I85.00, Esophageal varices without bleeding                           K76.6, Portal hypertension                           K31.89, Other diseases of stomach and duodenum                           K92.2, Gastrointestinal hemorrhage, unspecified CPT copyright 2017 American Medical Association. All rights reserved. The codes documented in this report are preliminary and upon coder review may  be revised to meet current compliance requirements. Mauri Pole, MD 12/16/2017 2:35:12 PM This report has been signed electronically. Number of Addenda: 0

## 2017-12-16 NOTE — Progress Notes (Addendum)
Progress Note  Patient Name: Brittney Valentine Date of Encounter: 12/16/2017  Primary Cardiologist: Minus Breeding, MD   Subjective   Pt states she feels well but is occasionally dizzy. We discussed this may be from her anemia vs BP/afib. Pressures 22-979G systolic.  Inpatient Medications    Scheduled Meds: . folic acid  1 mg Oral Daily  . levothyroxine  150 mcg Oral QAC breakfast  . metoprolol tartrate  25 mg Oral BID  . vancomycin  125 mg Oral QID   Continuous Infusions: . sodium chloride 100 mL/hr at 12/15/17 2216  . cefTRIAXone (ROCEPHIN)  IV Stopped (12/15/17 2203)  . pantoprozole (PROTONIX) infusion 8 mg/hr (12/16/17 0411)  . phytonadione (VITAMIN K) IV Stopped (12/14/17 1003)   PRN Meds: acetaminophen **OR** acetaminophen, ondansetron **OR** ondansetron (ZOFRAN) IV   Vital Signs    Vitals:   12/15/17 1933 12/15/17 2300 12/16/17 0505 12/16/17 0507  BP: 116/68 103/60 (!) 96/52   Pulse: 85 89 81   Resp: 20 17 19    Temp: (!) 97.5 F (36.4 C) (!) 97.4 F (36.3 C) 98 F (36.7 C) 98 F (36.7 C)  TempSrc: Oral Oral Oral   SpO2: 94% 95% 95%   Weight:    228 lb 13.4 oz (103.8 kg)  Height:        Intake/Output Summary (Last 24 hours) at 12/16/2017 0747 Last data filed at 12/16/2017 9211 Gross per 24 hour  Intake 2255.72 ml  Output 250 ml  Net 2005.72 ml   Filed Weights   12/13/17 2128 12/15/17 0600 12/16/17 0507  Weight: 219 lb 9.3 oz (99.6 kg) 225 lb 15.5 oz (102.5 kg) 228 lb 13.4 oz (103.8 kg)    Telemetry    Afib in the 90-100s - Personally Reviewed  ECG    No new tracings - Personally Reviewed  Physical Exam   GEN: No acute distress.   Neck: No JVD Cardiac: irregular rhythm, regular rate  Respiratory: Clear to auscultation bilaterally. GI: Soft, nontender, non-distended  MS: 2+ pitting edema on left LE, trace edema R LE Neuro:  Nonfocal  Psych: Normal affect   Labs    Chemistry Recent Labs  Lab 12/14/17 0338 12/14/17 1634  12/15/17 0333 12/15/17 0807 12/16/17 0258  NA 139 136 135  --  138  K 4.8 4.3 5.5* 4.2 4.1  CL 113* 107 111  --  114*  CO2 21* 20* 23  --  19*  GLUCOSE 105* 112* 81  --  95  BUN 50* 53* 51*  --  46*  CREATININE 1.63* 1.62* 1.50*  --  1.32*  CALCIUM 9.3 9.2 8.8*  --  8.8*  PROT 4.7*  --  4.4*  --  4.5*  ALBUMIN 1.8*  --  1.7*  --  1.6*  AST 62*  --  83*  --  56*  ALT 32  --  28  --  33  ALKPHOS 59  --  65  --  71  BILITOT 1.9*  --  1.7*  --  1.0  GFRNONAA 30* 30* 33*  --  39*  GFRAA 35* 35* 38*  --  45*  ANIONGAP 5 9 1*  --  5     Hematology Recent Labs  Lab 12/14/17 0749  12/15/17 0333 12/15/17 1404 12/15/17 2200 12/16/17 0258  WBC 24.6*  --  18.2*  --   --  13.9*  RBC 3.47*  --  3.30*  --   --  3.47*  HGB 10.8*   < >  10.2* 10.0* 10.7* 10.6*  HCT 32.0*   < > 32.0* 31.2* 31.7* 32.1*  MCV 92.2  --  97.0  --   --  92.5  MCH 31.1  --  30.9  --   --  30.5  MCHC 33.8  --  31.9  --   --  33.0  RDW 24.3*  --  23.9*  --   --  24.1*  PLT 365  --  375  --   --  370   < > = values in this interval not displayed.    Cardiac Enzymes Recent Labs  Lab 12/14/17 0338 12/14/17 0749  TROPONINI 0.11* 0.10*    Recent Labs  Lab 12/13/17 1518  TROPIPOC 0.07     BNP Recent Labs  Lab 12/14/17 0338  BNP 408.3*     DDimer No results for input(s): DDIMER in the last 168 hours.   Radiology    No results found.  Cardiac Studies   2D Echo 11/2017 Study Conclusions  - Left ventricle: The cavity size was normal. Wall thickness was increased in a pattern of mild LVH. Systolic function was normal. The estimated ejection fraction was in the range of 55% to 60%. Wall motion was normal; there were no regional wall motion abnormalities. - Mitral valve: Calcified annulus. There was mild regurgitation. - Left atrium: The atrium was moderately dilated. - Right atrium: The atrium was moderately dilated. - Pulmonary arteries: Systolic pressure was mildly increased.  PA peak pressure: 34 mm Hg (S).  Impressions:  - Normal LV function; mild LVH; mild MR; moderate biatrial enlargement; mild TR; mild pulmonary hypertension.  Patient Profile     77 y.o. female with history of persistent AF,previous cardiomyopathy most recent EF recovered, hypothyroidism, CKD, cirrhosis with varices, recent pulmonary embolism--she is being admitted for presumed urosepsis andcardiology wasconsulted for atrial fibrillation management.  Regarding her history, she was previously treated with flecainide although this was discontinued due to VT. Amiodarone also discontinued in the past due to cirrhosis and thyroid dysfunction. As an outpatient, there was a plan to initiate her on sotalol after being on anticoagulation for 3 weeks. However, this had been postponed due to a recent admission for pulmonary embolism.The most recent strategy was rate control with 25 mg of metoprolol and anticoagulation with Eliquis (PE dosing initially).  On admit, HR was in the 140s. 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 Now on antibiotics for UTI.Also with anemia and + FOBT  Tested + for C-diff   Assessment & Plan    1. Atrial fibrillation with RVR - previously failed flecainide (VT) and amiodarone (thyroid and cirrhosis) - previous plan was for sotalol after 3 weeks of anticoagulation - currently, rate-control strategy with lopressor - on 25 mg lopressor BID with rates in the 63-149F and systolic pressure 02-637C - anticoagulation with IV heparin - now paused for EGD toay - transition to eliquis when tolerating POs and no further procedures planned   2. PE/DVT - anticoagulated with IV heparin while not tolerating POs - significant edema in left lower extremity   3. Urosepsis - likely driving RVR - per primary, on ABX   4. Anemia - + FOBT in setting of hemorrhoids - Hb today 10.6 - stable - GI planning EGD today       For questions or updates, please contact [place] Please consult [email or web address] for contact info under Cardiology/STEMI.      Signed, Ledora Bottcher, PA  12/16/2017, 7:47 AM    Attending Note:  The patient was seen and examined.  Agree with assessment and plan as noted above.  Changes made to the above note as needed.  Patient seen and independently examined with Doreene Adas, PA .   We discussed all aspects of the encounter. I agree with the assessment and plan as stated above.  1.   Atrial fib with RVR. HR is better.  The rapid ventricular response is likely due to her C. difficile colitis. No additional cardiac recommendations. He still having GI procedures.  Would restart her Eliquis as soon is we are through doing procedures on her.  She will follow-up with Dr. Percival Spanish.   I have spent a total of 40 minutes with patient reviewing hospital  notes , telemetry, EKGs, labs and examining patient as well as establishing an assessment and plan that was discussed with the patient. > 50% of time was spent in direct patient care.  [facility] will sign off.   Medication Recommendations:  Restart eliquis when she is through with procedures.    Other recommendations (labs, testing, etc):   Follow up as an outpatient:  Follow up with Dr. Percival Spanish.    Thayer Headings, Brooke Bonito., MD, [facility] 12/16/2017, 9:27 AM [address],  [place] Pager [phone]

## 2017-12-16 NOTE — Transfer of Care (Signed)
Immediate Anesthesia Transfer of Care Note  Patient: Brittney Valentine  Procedure(s) Performed: ESOPHAGOGASTRODUODENOSCOPY (EGD) (N/A )  Patient Location: Endoscopy Unit  Anesthesia Type:MAC  Level of Consciousness: awake, alert  and oriented  Airway & Oxygen Therapy: Patient Spontanous Breathing and Patient connected to nasal cannula oxygen  Post-op Assessment: Report given to RN, Post -op Vital signs reviewed and stable and Patient moving all extremities X 4  Post vital signs: Reviewed and stable  Last Vitals:  Vitals Value Taken Time  BP 105/71 12/16/2017  2:24 PM  Temp    Pulse 95 12/16/2017  2:26 PM  Resp 18 12/16/2017  2:26 PM  SpO2 98 % 12/16/2017  2:26 PM  Vitals shown include unvalidated device data.  Last Pain:  Vitals:   12/16/17 1424  TempSrc: Oral  PainSc: 0-No pain      Patients Stated Pain Goal: 0 (97/41/63 8453)  Complications: No apparent anesthesia complications

## 2017-12-16 NOTE — Consult Note (Signed)
   [facility] CM Inpatient Consult   12/16/2017  Brittney Valentine 1940/10/21 [id number]    Patient screened for potential [facility] Care Management program due to unplanned readmission risk score of 24% (high).  Went to bedside to speak with patient about [place] Management program. However, she just recently arrived back to room from endo. Discussed that writer will follow up at later time.  Left [facility] Care Management brochure and 24-hr nurse advice line magnet.  Made inpatient RNCM aware of bedside visit attempt.   [name], MSN-Ed, RN,BSN [facility] Liaison [phone]

## 2017-12-17 ENCOUNTER — Encounter (HOSPITAL_COMMUNITY): Payer: Self-pay | Admitting: Physician Assistant

## 2017-12-17 DIAGNOSIS — K7460 Unspecified cirrhosis of liver: Secondary | ICD-10-CM

## 2017-12-17 DIAGNOSIS — B955 Unspecified streptococcus as the cause of diseases classified elsewhere: Secondary | ICD-10-CM

## 2017-12-17 DIAGNOSIS — Z881 Allergy status to other antibiotic agents status: Secondary | ICD-10-CM

## 2017-12-17 DIAGNOSIS — R7881 Bacteremia: Secondary | ICD-10-CM

## 2017-12-17 DIAGNOSIS — R188 Other ascites: Secondary | ICD-10-CM

## 2017-12-17 DIAGNOSIS — B954 Other streptococcus as the cause of diseases classified elsewhere: Secondary | ICD-10-CM

## 2017-12-17 DIAGNOSIS — A0472 Enterocolitis due to Clostridium difficile, not specified as recurrent: Secondary | ICD-10-CM

## 2017-12-17 DIAGNOSIS — Z86711 Personal history of pulmonary embolism: Secondary | ICD-10-CM

## 2017-12-17 DIAGNOSIS — I8510 Secondary esophageal varices without bleeding: Secondary | ICD-10-CM

## 2017-12-17 DIAGNOSIS — R161 Splenomegaly, not elsewhere classified: Secondary | ICD-10-CM

## 2017-12-17 DIAGNOSIS — Z882 Allergy status to sulfonamides status: Secondary | ICD-10-CM

## 2017-12-17 DIAGNOSIS — Z86718 Personal history of other venous thrombosis and embolism: Secondary | ICD-10-CM

## 2017-12-17 DIAGNOSIS — I482 Chronic atrial fibrillation: Secondary | ICD-10-CM

## 2017-12-17 DIAGNOSIS — Z960 Presence of urogenital implants: Secondary | ICD-10-CM

## 2017-12-17 DIAGNOSIS — R8271 Bacteriuria: Secondary | ICD-10-CM

## 2017-12-17 DIAGNOSIS — R51 Headache: Secondary | ICD-10-CM

## 2017-12-17 DIAGNOSIS — Z888 Allergy status to other drugs, medicaments and biological substances status: Secondary | ICD-10-CM

## 2017-12-17 LAB — CBC
HCT: 33.2 % — ABNORMAL LOW (ref 36.0–46.0)
Hemoglobin: 11.3 g/dL — ABNORMAL LOW (ref 12.0–15.0)
MCH: 31.3 pg (ref 26.0–34.0)
MCHC: 34 g/dL (ref 30.0–36.0)
MCV: 92 fL (ref 78.0–100.0)
PLATELETS: 247 10*3/uL (ref 150–400)
RBC: 3.61 MIL/uL — ABNORMAL LOW (ref 3.87–5.11)
RDW: 24.2 % — AB (ref 11.5–15.5)
WBC: 15.6 10*3/uL — ABNORMAL HIGH (ref 4.0–10.5)

## 2017-12-17 LAB — URINE CULTURE

## 2017-12-17 LAB — HEPARIN LEVEL (UNFRACTIONATED): Heparin Unfractionated: 0.68 IU/mL (ref 0.30–0.70)

## 2017-12-17 MED ORDER — APIXABAN 5 MG PO TABS
5.0000 mg | ORAL_TABLET | Freq: Two times a day (BID) | ORAL | Status: DC
  Administered 2017-12-17 – 2017-12-24 (×15): 5 mg via ORAL
  Filled 2017-12-17 (×15): qty 1

## 2017-12-17 MED ORDER — PANTOPRAZOLE SODIUM 40 MG PO TBEC
40.0000 mg | DELAYED_RELEASE_TABLET | Freq: Every day | ORAL | Status: DC
  Administered 2017-12-18 – 2017-12-24 (×7): 40 mg via ORAL
  Filled 2017-12-17 (×7): qty 1

## 2017-12-17 MED ORDER — FUROSEMIDE 20 MG PO TABS
20.0000 mg | ORAL_TABLET | Freq: Every day | ORAL | Status: DC
  Administered 2017-12-17 – 2017-12-18 (×2): 20 mg via ORAL
  Filled 2017-12-17 (×2): qty 1

## 2017-12-17 MED ORDER — PROPRANOLOL HCL 40 MG PO TABS
20.0000 mg | ORAL_TABLET | Freq: Two times a day (BID) | ORAL | Status: DC
  Administered 2017-12-18 – 2017-12-24 (×12): 20 mg via ORAL
  Filled 2017-12-17 (×14): qty 1

## 2017-12-17 MED ORDER — SPIRONOLACTONE 25 MG PO TABS
25.0000 mg | ORAL_TABLET | Freq: Every day | ORAL | Status: DC
  Administered 2017-12-17 – 2017-12-24 (×8): 25 mg via ORAL
  Filled 2017-12-17 (×8): qty 1

## 2017-12-17 NOTE — Consult Note (Addendum)
Brittney Valentine for Infectious Disease    Date of Admission:  12/13/2017   Total days of antibiotics 5        Day 5: Ceftriaxone         Day 4: Vancomycin        Reason for Consult: Strep viridans bacteremia, C. difficile     Referring Provider: Dr. Sarajane Jews Primary Care Provider: Dr. 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   Brittney Valentine has been experiencing severe diarrhea that is FOBT positive for the past 13 days that has continued to the point of incontinence. She denies ever having similar symptoms in the past or diarrhea that required antibiotic treatment. Upon admission, she was found to have a very elevated WBC and positive C. Diff stool studies. At this time, she began treatment with IV Vancomycin and switched to oral Vanc the next day. Her WBC has steadily decreased since admission and Mrs. Burrowes reports improvement in symptoms. Since this is her first infection, it would be adequate to continue PO Vancomycin for an additional 10 days, so total treatment duration will have been 14 days.   An elevated lactic acid with hypotension and tachycardia prompted blood cultures in the ED on 12/13/2017. Cultures grew Strep viridans. This type of bacteremia is more common in older patients that have comorbidities such as chronic liver disease. Susceptibility testing showed Ceftriaxone is adequate treatment. Brittney Valentine is responding well. Continue Ceftriaxone for an additional 2 days to complete a 7 day course. This works with her Vancomycin timeline as well, as Vanc will be continued for another week after stopping Ceftriaxone.   Urine cultures on admission grew Klebsiella and Enterococcus faecalis. However, Brittney Valentine reports she has been asymptomatic. It is not necessary to change or add treatment for asymptomatic cystitis only.  Vancomycin will be able to cover Enterococcus and Ceftriaxone will cover Klebsiella.   Plan: 1. Continue PO Vancomycin until 12/26/2017 2. Continue IV Ceftriaxone for an additional two days.   Principal Problem:   Sepsis ([place]) Active Problems:   Hepatic cirrhosis (HCC)   Esophageal varices (HCC)   Atrial fibrillation with RVR (HCC)   Hypothyroidism   Acute lower UTI   DVT (deep venous thrombosis) (HCC)   PE (pulmonary thromboembolism) (HCC)   Bacteremia   Heme positive stool   Clostridium difficile colitis   Coagulopathy (HCC)   Scheduled Meds: . apixaban  5 mg Oral BID  . folic acid  1 mg Oral Daily  . levothyroxine  150 mcg Oral QAC breakfast  . metoprolol tartrate  25 mg Oral BID  . [START ON 12/18/2017] pantoprazole  40 mg Oral Daily  . phytonadione  5 mg Oral Daily  . saccharomyces boulardii  250 mg Oral BID  . vancomycin  125 mg Oral QID   Continuous Infusions: . cefTRIAXone (ROCEPHIN)  IV 2 g (12/16/17 2142)   PRN Meds:.acetaminophen **OR** acetaminophen, ondansetron **OR** ondansetron (ZOFRAN) IV  HPI: Brittney Valentine is a 77 y.o. female with a PMH of hepatic cirrhosis c/b esophageal varices and ascites, Afib with RVR and recent DVT with bilateral PE that presented to the ED on 12/13/2017 with c/o decreased energy for the past week. Mrs. Garza states that she began to have diarrhea with black stool on the day she was admitted two weeks ago on  12/04/2017. Symptoms have continued and worsened since, to the point she has bowel incontinence. Prior to coming to the ED on 12/13/2017, Mrs. Closson says she felt feverish with shaking chills and nausea with vomiting. Today, she endorses continued chills and nausea, but denies further vomiting. She denies dysuria, frequency and hematuria prior to admission. She reports some dysuria when the foley catheter was placed.   Mrs. Gardella underwent two cystoscopy procedures, one on 10/26/2017 and another one 11/12/2017. She states her  urologist placed her on an antibiotic after the 11/12/2017 procedure, but she cannot recall the name. On 11/25/2017 she went to the ED with c/o dehydration; at that time, she had a urine culture that grew Klebsiella and Mrs. Miranda was given a one time Rocephin injection and discharged with 5 days of Keflex. She reports compliance with the treatment. On 12/04/2017, she presented to the ED and was found to have a left lower extremity DVT with bilateral PE.   Review of Systems: Review of Systems  Constitutional: Positive for chills, fever (resolved) and malaise/fatigue.  Respiratory: Positive for shortness of breath (only with exertion). Negative for cough.   Cardiovascular: Positive for leg swelling. Negative for chest pain.  Gastrointestinal: Positive for diarrhea, melena and nausea. Negative for abdominal pain and vomiting.  Genitourinary: Negative for dysuria and hematuria.  Musculoskeletal: Negative for myalgias.  Neurological: Positive for headaches. Negative for dizziness.    Past Medical History:  Diagnosis Date  . Acute UTI   . Anemia   . Blood transfusion without reported diagnosis   . Cirrhosis ([place])   . Degenerative disk disease    knees  . Diverticulosis   . Dysrhythmia   . GERD (gastroesophageal reflux disease)   . Hemorrhoids   . History of kidney stones   . Hypertension   . Hypothyroidism   . Obesity   . Paroxysmal atrial fibrillation (HCC)   . Partial small bowel obstruction ([place])   . Personal history of colonic polyps 03/25/2012   8 mm rectal adenoma 03/2012  . Portal hypertensive gastropathy ([place])   . Shingles   . Thyroid disease   . UTI (urinary tract infection) 12/13/2017  . Varices, esophageal ([place])   . Zoster     Social History   Tobacco Use  . Smoking status: Never Smoker  . Smokeless tobacco: Never Used  Substance Use Topics  . Alcohol use: No  . Drug use: No    Family History  Problem Relation Age of Onset  . Heart failure Mother        died  from  . Hypertension Mother   . Heart attack Father        died from  . Hypertension Sister    Allergies  Allergen Reactions  . Amoxicillin Palpitations    Has patient had a PCN reaction causing immediate rash, facial/tongue/throat swelling, SOB or lightheadedness with hypotension: No Has patient had a PCN reaction causing severe rash involving mucus membranes or skin necrosis: No Has patient had a PCN reaction that required hospitalization: No Has patient had a PCN reaction occurring within the last 10 years: No If all of the above answers are "NO", then may proceed with Cephalosporin use.   . Ciprofloxacin Swelling  . Sulfonamide Derivatives Other (See Comments)    See little dots, skin feels like its burning  . Robaxin [Methocarbamol] Palpitations  . Verapamil Palpitations    OBJECTIVE: Blood pressure (!) 92/49, pulse 99, temperature 98.3 F (36.8 C), temperature source Oral, resp. rate Marland Kitchen)  22, height 5\' 2"  (1.575 m), weight 105.3 kg (232 lb 2.3 oz), SpO2 97 %.  Physical Exam  Constitutional: She appears well-developed and well-nourished. No distress.  Cardiovascular: Normal rate. An irregularly irregular rhythm present. Exam reveals no gallop.  No murmur heard. Pulmonary/Chest: Effort normal and breath sounds normal. No stridor. No respiratory distress. She has no wheezes. She has no rales.  Abdominal: Soft. Bowel sounds are normal. She exhibits no distension. There is no tenderness. There is no rebound and no guarding.  Musculoskeletal: She exhibits edema (Lower extremity: bilateral pitting edema that is significantly worse on the left. ).  Neurological: She is alert.  Skin: Bruising (Left arm brusing secondary to blood draws) noted. She is not diaphoretic.    Lab Results Lab Results  Component Value Date   WBC 15.6 (H) 12/17/2017   HGB 11.3 (L) 12/17/2017   HCT 33.2 (L) 12/17/2017   MCV 92.0 12/17/2017   PLT 247 12/17/2017    Lab Results  Component Value Date    CREATININE 1.32 (H) 12/16/2017   BUN 46 (H) 12/16/2017   NA 138 12/16/2017   K 4.1 12/16/2017   CL 114 (H) 12/16/2017   CO2 19 (L) 12/16/2017    Lab Results  Component Value Date   ALT 33 12/16/2017   AST 56 (H) 12/16/2017   ALKPHOS 71 12/16/2017   BILITOT 1.0 12/16/2017     Microbiology: Recent Results (from the past 240 hour(s))  Blood culture (routine x 2)     Status: Abnormal   Collection Time: 12/13/17  5:47 PM  Result Value Ref Range Status   Specimen Description BLOOD LEFT HAND  Final   Special Requests   Final    BOTTLES DRAWN AEROBIC AND ANAEROBIC Blood Culture adequate volume   Culture  Setup Time   Final    GRAM POSITIVE COCCI IN CHAINS IN BOTH AEROBIC AND ANAEROBIC BOTTLES CRITICAL RESULT CALLED TO, READ BACK BY AND VERIFIED WITH: Asencion Islam PharmD 10:25 12/14/17 ([email or web address]) Performed at [facility], [address], [place], [place] [address]    Culture VIRIDANS STREPTOCOCCUS (A)  Final   Report Status 12/16/2017 FINAL  Final   Organism ID, Bacteria VIRIDANS STREPTOCOCCUS  Final      Susceptibility   Viridans streptococcus - MIC*    TETRACYCLINE 0.5 SENSITIVE Sensitive     VANCOMYCIN 0.5 SENSITIVE Sensitive     CLINDAMYCIN <=0.25 SENSITIVE Sensitive     PENICILLIN Value in next row Intermediate      INTERMEDIATE0.25    CEFTRIAXONE Value in next row Sensitive      SENSITIVE0.25    * VIRIDANS STREPTOCOCCUS  Blood culture (routine x 2)     Status: Abnormal   Collection Time: 12/13/17  5:47 PM  Result Value Ref Range Status   Specimen Description BLOOD LEFT HAND  Final   Special Requests   Final    BOTTLES DRAWN AEROBIC AND ANAEROBIC Blood Culture adequate volume   Culture  Setup Time   Final    GRAM POSITIVE COCCI IN CHAINS IN BOTH AEROBIC AND ANAEROBIC BOTTLES CRITICAL VALUE NOTED.  VALUE IS CONSISTENT WITH PREVIOUSLY REPORTED AND CALLED VALUE.    Culture (A)  Final    VIRIDANS STREPTOCOCCUS SUSCEPTIBILITIES PERFORMED ON PREVIOUS CULTURE WITHIN  THE LAST 5 DAYS. Performed at [facility], [place] [address] [place], [place] [address]    Report Status 12/16/2017 FINAL  Final  Blood Culture ID Panel (Reflexed)     Status:  Abnormal   Collection Time: 12/13/17  5:47 PM  Result Value Ref Range Status   Enterococcus species NOT DETECTED NOT DETECTED Final   Listeria monocytogenes NOT DETECTED NOT DETECTED Final   Staphylococcus species NOT DETECTED NOT DETECTED Final   Staphylococcus aureus NOT DETECTED NOT DETECTED Final   Streptococcus species DETECTED (A) NOT DETECTED Final    Comment: Not Enterococcus species, Streptococcus agalactiae, Streptococcus pyogenes, or Streptococcus pneumoniae. CRITICAL RESULT CALLED TO, READ BACK BY AND VERIFIED WITH: Asencion Islam PharmD 10:25 12/14/17 ([email or web address])    Streptococcus agalactiae NOT DETECTED NOT DETECTED Final   Streptococcus pneumoniae NOT DETECTED NOT DETECTED Final   Streptococcus pyogenes NOT DETECTED NOT DETECTED Final   Acinetobacter baumannii NOT DETECTED NOT DETECTED Final   Enterobacteriaceae species NOT DETECTED NOT DETECTED Final   Enterobacter cloacae complex NOT DETECTED NOT DETECTED Final   Escherichia coli NOT DETECTED NOT DETECTED Final   Klebsiella oxytoca NOT DETECTED NOT DETECTED Final   Klebsiella pneumoniae NOT DETECTED NOT DETECTED Final   Proteus species NOT DETECTED NOT DETECTED Final   Serratia marcescens NOT DETECTED NOT DETECTED Final   Haemophilus influenzae NOT DETECTED NOT DETECTED Final   Neisseria meningitidis NOT DETECTED NOT DETECTED Final   Pseudomonas aeruginosa NOT DETECTED NOT DETECTED Final   Candida albicans NOT DETECTED NOT DETECTED Final   Candida glabrata NOT DETECTED NOT DETECTED Final   Candida krusei NOT DETECTED NOT DETECTED Final   Candida parapsilosis NOT DETECTED NOT DETECTED Final   Candida tropicalis NOT DETECTED NOT DETECTED Final    Comment: Performed at [facility], [place] [address] [place], [place] [address]  Culture, Urine      Status: Abnormal   Collection Time: 12/13/17  6:00 PM  Result Value Ref Range Status   Specimen Description URINE, RANDOM  Final   Special Requests   Final    NONE Performed at [facility], [place] [address], [place], [place] [address]    Culture (A)  Final    >=100,000 COLONIES/mL KLEBSIELLA PNEUMONIAE >=100,000 COLONIES/mL ENTEROCOCCUS FAECALIS    Report Status 12/17/2017 FINAL  Final   Organism ID, Bacteria KLEBSIELLA PNEUMONIAE (A)  Final   Organism ID, Bacteria ENTEROCOCCUS FAECALIS (A)  Final      Susceptibility   Enterococcus faecalis - MIC*    AMPICILLIN <=2 SENSITIVE Sensitive     LEVOFLOXACIN 1 SENSITIVE Sensitive     NITROFURANTOIN <=16 SENSITIVE Sensitive     VANCOMYCIN 1 SENSITIVE Sensitive     * >=100,000 COLONIES/mL ENTEROCOCCUS FAECALIS   Klebsiella pneumoniae - MIC*    AMPICILLIN >=32 RESISTANT Resistant     CEFAZOLIN <=4 SENSITIVE Sensitive     CEFTRIAXONE <=1 SENSITIVE Sensitive     CIPROFLOXACIN 1 SENSITIVE Sensitive     GENTAMICIN <=1 SENSITIVE Sensitive     IMIPENEM <=0.25 SENSITIVE Sensitive     NITROFURANTOIN 64 INTERMEDIATE Intermediate     TRIMETH/SULFA >=320 RESISTANT Resistant     AMPICILLIN/SULBACTAM 4 SENSITIVE Sensitive     PIP/TAZO <=4 SENSITIVE Sensitive     Extended ESBL NEGATIVE Sensitive     * >=100,000 COLONIES/mL KLEBSIELLA PNEUMONIAE  C difficile quick scan w PCR reflex     Status: Abnormal   Collection Time: 12/14/17  6:43 PM  Result Value Ref Range Status   C Diff antigen POSITIVE (A) NEGATIVE Final   C Diff toxin POSITIVE (A) NEGATIVE Final   C Diff interpretation Toxin producing C. difficile detected.  Final    Comment: CRITICAL  RESULT CALLED TO, READ BACK BY AND VERIFIED WITH: Miguel Dibble RN 2006 12/14/17 A BROWNING Performed at [facility], [place] [address] [place], [place] [address]   Culture, blood (routine x 2)     Status: None (Preliminary result)   Collection Time: 12/15/17  3:25 AM  Result Value Ref Range  Status   Specimen Description BLOOD LEFT ANTECUBITAL  Final   Special Requests   Final    BOTTLES DRAWN AEROBIC ONLY Blood Culture results may not be optimal due to an inadequate volume of blood received in culture bottles   Culture   Final    NO GROWTH 2 DAYS Performed at [facility], [place] [address] [place], [place] [address]    Report Status PENDING  Incomplete  Culture, blood (routine x 2)     Status: None (Preliminary result)   Collection Time: 12/15/17  3:30 AM  Result Value Ref Range Status   Specimen Description BLOOD LEFT ANTECUBITAL  Final   Special Requests   Final    BOTTLES DRAWN AEROBIC ONLY Blood Culture results may not be optimal due to an inadequate volume of blood received in culture bottles   Culture   Final    NO GROWTH 2 DAYS Performed at [facility], [place] [address], [place], [place] [address]    Report Status PENDING  Incomplete    Jose Persia, MS4 Dr. Michel Bickers, [place] for [place] [phone] pager   [phone] cell 12/17/2017, 1:32 PM

## 2017-12-17 NOTE — Care Management Note (Signed)
Case Management Note Marvetta Gibbons RN, BSN Unit 4E-Case Manager [phone]  Patient Details  Name: Brittney Valentine MRN: [id number] Date of Birth: 08/30/1940  Subjective/Objective:  Pt admitted with sepsis, secondary to Klebsiella, Enterococcus faecalis UTI, c-diff.                   Action/Plan: PTA Pt lived at home recently discharged 6/30 with [facility] services HHRN/PT referral to [facility]- will need resumption order for discharge- [facility] to f/u with pt for possible services. CM will follow for transition of care needs  Expected Discharge Date:                  Expected Discharge Plan:  [place]  In-House Referral:  NA  Discharge planning Services  CM Consult  Post Acute Care Choice:  Home Health, Resumption of Svcs/PTA Provider Choice offered to:  Patient  DME Arranged:    DME Agency:     HH Arranged:    [place] Agency:  [facility]  Status of Service:  In process, will continue to follow  If discussed at Long Length of Stay Meetings, dates discussed:    Discharge Disposition:   Additional Comments:  Dawayne Patricia, RN 12/17/2017, 3:51 PM

## 2017-12-17 NOTE — Progress Notes (Signed)
ANTICOAGULATION CONSULT NOTE - Follow-up Consult  Pharmacy Consult for Heparin Indication: pulmonary embolus  Patient Measurements: Height: 5\' 2"  (157.5 cm) Weight: 232 lb 2.3 oz (105.3 kg) IBW/kg (Calculated) : 50.1 Heparin Dosing Weight: 73.7 kg  Vital Signs: Temp: 98.3 F (36.8 C) (07/11 0417) Temp Source: Oral (07/11 0417) BP: 92/49 (07/11 0800) Pulse Rate: 99 (07/11 0800)  Labs: Recent Labs    12/14/17 1634  12/15/17 0333  12/15/17 1525 12/15/17 2200 12/16/17 0258 12/16/17 2201 12/17/17 0431 12/17/17 0825  HGB 10.8*  --  10.2*   < >  --  10.7* 10.6*  --  11.3*  --   HCT 32.5*  --  32.0*   < >  --  31.7* 32.1*  --  33.2*  --   PLT  --   --  375  --   --   --  370  --  247  --   APTT 59*  --  >200*  --  46*  --  148*  --   --   --   LABPROT  --   --  30.1*  --   --   --  20.5*  --   --   --   INR  --   --  2.90  --   --   --  1.77  --   --   --   HEPARINUNFRC  --    < > >2.20*  --  1.16*  --  0.91* 0.19*  --  0.68  CREATININE 1.62*  --  1.50*  --   --   --  1.32*  --   --   --    < > = values in this interval not displayed.    Estimated Creatinine Clearance: 42.6 mL/min (A) (by C-G formula based on SCr of 1.32 mg/dL (H)).   Medications:  Scheduled:  . apixaban  5 mg Oral BID  . folic acid  1 mg Oral Daily  . levothyroxine  150 mcg Oral QAC breakfast  . metoprolol tartrate  25 mg Oral BID  . [START ON 12/18/2017] pantoprazole  40 mg Oral Daily  . phytonadione  5 mg Oral Daily  . saccharomyces boulardii  250 mg Oral BID  . vancomycin  125 mg Oral QID    Assessment: 15 YOF admitted with PE. Currently on Eliquis for Afib/PE/DVT per MD note. Last dose of Eliquis 7/6 1900. Pharmacy consulted to dose heparin for PE.  Heparin level this morning resulted as therapeutic (HL 0.68). No issues with the drip or bleeding noted per RN. CBC stable  Goal of Therapy:  Heparin level 0.3-0.7 units/ml Monitor platelets by anticoagulation protocol: Yes   Plan:  Dc  heparin Resume apixaban 5 mg BID  Levester Fresh, PharmD, BCPS, BCCCP Clinical Pharmacist [phone]  Please check AMION for all [place] numbers  12/17/2017 1:02 PM

## 2017-12-17 NOTE — Progress Notes (Signed)
[facility] Infusion Coordinator will follow pt with ID team to provide Home Infusion Pharmacy services at DC for home IV ABX as ordered.   Pt was active with [facility] prior to this admission.   If patient discharges after hours, please call [phone].   Larry Sierras 12/17/2017, 11:44 PM

## 2017-12-17 NOTE — Progress Notes (Addendum)
Daily Rounding Note  12/17/2017, 11:50 AM  LOS: 4 days   SUBJECTIVE:   Chief complaint: only 3 watery to liquid, brown stools yesterday, 1 today.  Down from 10 to 15 daily at peak.  No abd pain.  C/o difficult blood draws and phlebotomy associated bruises Appetite fair.  No nausea.  Still feels weak.    OBJECTIVE:         Vital signs in last 24 hours:    Temp:  [96.6 F (35.9 C)-98.3 F (36.8 C)] 98.3 F (36.8 C) (07/11 0417) Pulse Rate:  [87-103] 99 (07/11 0800) Resp:  [15-24] 22 (07/11 0614) BP: (85-113)/(39-87) 92/49 (07/11 0800) SpO2:  [94 %-100 %] 97 % (07/11 0614) Weight:  [232 lb 2.3 oz (105.3 kg)] 232 lb 2.3 oz (105.3 kg) (07/11 0553) Last BM Date: 12/16/17 Filed Weights   12/15/17 0600 12/16/17 0507 12/17/17 0553  Weight: 225 lb 15.5 oz (102.5 kg) 228 lb 13.4 oz (103.8 kg) 232 lb 2.3 oz (105.3 kg)   General: looks chronically ill.  obese   Heart: RRR Chest: clear bil.  Some labored breathing with speaking Abdomen: obese, soft, NT.  Active BS  Extremities: + bil LE and UE edema.   Multiple large bruises on left arm Neuro/Psych:  Oriented  X 3.  Alert.    Intake/Output from previous day: 07/10 0701 - 07/11 0700 In: 1520.2 [P.O.:360; I.V.:1060.2; IV Piggyback:100] Out: 751 [Urine:750; Blood:1]  Intake/Output this shift: No intake/output data recorded.  Lab Results: Recent Labs    12/15/17 0333  12/15/17 2200 12/16/17 0258 12/17/17 0431  WBC 18.2*  --   --  13.9* 15.6*  HGB 10.2*   < > 10.7* 10.6* 11.3*  HCT 32.0*   < > 31.7* 32.1* 33.2*  PLT 375  --   --  370 247   < > = values in this interval not displayed.   BMET Recent Labs    12/14/17 1634 12/15/17 0333 12/15/17 0807 12/16/17 0258  NA 136 135  --  138  K 4.3 5.5* 4.2 4.1  CL 107 111  --  114*  CO2 20* 23  --  19*  GLUCOSE 112* 81  --  95  BUN 53* 51*  --  46*  CREATININE 1.62* 1.50*  --  1.32*  CALCIUM 9.2 8.8*  --  8.8*    LFT Recent Labs    12/15/17 0333 12/16/17 0258  PROT 4.4* 4.5*  ALBUMIN 1.7* 1.6*  AST 83* 56*  ALT 28 33  ALKPHOS 65 71  BILITOT 1.7* 1.0  BILIDIR 0.6* 0.4*  IBILI 1.1* 0.6   PT/INR Recent Labs    12/15/17 0333 12/16/17 0258  LABPROT 30.1* 20.5*  INR 2.90 1.77   Scheduled Meds: . folic acid  1 mg Oral Daily  . levothyroxine  150 mcg Oral QAC breakfast  . metoprolol tartrate  25 mg Oral BID  . phytonadione  5 mg Oral Daily  . saccharomyces boulardii  250 mg Oral BID  . vancomycin  125 mg Oral QID   Continuous Infusions: . cefTRIAXone (ROCEPHIN)  IV 2 g (12/16/17 2142)  . heparin 1,250 Units/hr (12/17/17 0010)  . pantoprozole (PROTONIX) infusion 8 mg/hr (12/17/17 1113)   PRN Meds:.acetaminophen **OR** acetaminophen, ondansetron **OR** ondansetron (ZOFRAN) IV   ASSESMENT:   *  FOBT+ dark stools.  Anemia.   12/16/17 EGD: grade 2, non-bleeding esophageal varices not banded.  Portal hypertensive gastropathy.   No  blood transfusions to date.     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    *   Coagulopathy.  INT 4.1 >> IV Vit K 7/8 and 7/9 >> 1.7.  PO  Vitamin K initiated 7/11 - 7/16.     *  Ascites.  Small to moderate per CT on 09/09/17.  Aldactone 100 mg, Lasix 40 mg daily PTA.  Does she need ultrasound to reassess and if significant, send fluid for r/o SBP?   *   Rectal adenoma 03/2012, overdue for surveillance colonoscopy (was due 03/2017).    *   C diff.  On po Vanc, florastor.    *    Left LE DVT, PE diagnosed 11/2017.  Eliquis since then, now covered with Heparin gtt.     *   Klebsiella and Enterococcal sepsis.  No clear source.  On Rocephin.  ID consult pending.    *  Protein depletion.  Suspect PCM.     PLAN   *  Switch to Eliquis, off Heparin.    *  Non-selective BB preferable for pt's with esophageal varices. Dr Sarajane Jews will make change: Propanolol 20 mg BID.  Will need to make sure her BP and pulse tolerate this but currently pulse in  90s to low 100s so have wiggle room there.  However SBPs in 80s to low 100s, DBPs in 30s to 60s.      *  Stop PPI drip.  Start PO Protonix.    *  ? Duration of Vancomycin?  Should probably be continued for at least 7 days beyond completion of any other abx (currently that's Rocephin) ID will be seeing pt later today.      Brittney Valentine  12/17/2017, 11:50 AM Phone [phone]   Attending physician's note   I have taken an interval history, reviewed the chart and examined the patient. I agree with the Advanced Practitioner's note, impression and recommendations.  Grade 2 nonbleeding esophageal varices, on nonselective beta-blocker, titrate as tolerated to goal heart rate less than 60 Okay to discontinue PPI gtt, switch to oral Protonix daily Antibiotic management per ID and primary team We will sign off, available if have any questions  K. Denzil Magnuson , MD [phone]

## 2017-12-17 NOTE — Progress Notes (Addendum)
PROGRESS NOTE  Brittney Valentine XBM:[id number] DOB: Apr 18, 1941 DOA: 12/13/2017 PCP: Unk Pinto, MD  Brief Narrative: 77 year old woman PMH cirrhosis, atrial fibrillation, PE, DVT, presented with weakness, nausea, vomiting.  Admitted for suspected sepsis secondary to UTI, atrial fibrillation with rapid ventricular response  Discharged 6/30, treated for bilateral PE, left lower extremity DVT in the context of recent kidney stone removal, patient off Eliquis in anticipation of colonoscopy and EGD.  Assessment/Plan Sepsis secondary to Klebsiella, Enterococcus faecalis UTI --Remains afebrile, vitals stable.  Mild leukocytosis noted.  We will continue ceftriaxone.  Will consult ID for further antimicrobial recommendations.  Hold off on specifically treating enterococcus as the patient has clinically improved, pending infectious disease recommendations.  Strep viridans bacteremia  --Asymptomatic.  Continue ceftriaxone.  C diff colitis --only one stool recorded last 24 hours.  Subjective improvement reported.  Continue oral vancomycin.  Atrial fibrillation with rapid ventricular response --Okay to resume apixaban per GI.  Nonselective beta-blocker recommended by GI, propranolol.  Change to p.o. Protonix. Avoid amiodarone given hypothyroidism, elevated LFTs, per cardiology.  Anemia acute on chronic; folate deficiency.  Fecal occult blood positive. --Hemoglobin remained stable.  Continue folate.  EGD without evidence of bleeding.  Report below.    Acute kidney injury --Continues to trend down.  NASH cirrhosis, esophageal varices, ascites, splenomegaly, coagulopathy/elevated INR --per oncology: Continue Vit K 5mg  po daily for 1-2 weeks   Atrial fibrillation, PMH ventricular tachycardia --Cardiology has recommended rate control strategy, not a candidate for amiodarone secondary to liver disease, has had ventricular tachycardia flecainide. --Continue apixaban  Chronic combined diastolic,  systolic CHF --Appears stable from a pulmonary standpoint.  Has volume overload bilateral lower legs, patient reports this is chronic.  Diuresis difficult with blood pressure.  Will restart Lasix and spironolactone at lower doses.  Fluid balance positive reportedly 6 L.  Watch creatinine.  Hypothyroidism, abnormal TSH, follow-up as an outpatient  Recent dx PE/DVT --Continue apixaban  Given complex case, will ask infectious disease to help guide antibiotic therapy for multiple infections.  DVT prophylaxis: Apixaban Code Status: partial Family Communication: none Disposition Plan: pending    Brittney Hodgkins, MD  [facility] Direct contact: [phone] --Via amion app OR  --[email or web address]; password [email or web address]  7PM-7AM contact night coverage as above 12/17/2017, 9:25 AM  LOS: 4 days   Consultants:  Cardiology  GI  Hematology   Procedures:  EGD Impression:               - Grade II esophageal varices.                           - Esophagogastric landmarks identified.                           - Portal hypertensive gastropathy.                           - Normal examined duodenum.                           - No specimens collected.  Antimicrobials:  Ceftriaxone  Vancomycin  Interval history/Subjective: Feels okay today.  Objective: Vitals:  Vitals:   12/17/17 0614 12/17/17 0800  BP: (!) 100/39 (!) 92/49  Pulse: 95 99  Resp: (!) 22   Temp:    SpO2: 97%     Exam: Constitutional:   .  Appears calm and comfortable Respiratory:  . CTA bilaterally, no w/r/r.  . Respiratory effort normal.  Cardiovascular:  . RRR, no m/r/g . 3+ bilateral LE extremity edema   Abdomen:  . Soft, nontender Psychiatric:  . Mental status o Mood, affect appropriate  I have personally reviewed the following:   Labs:  Creatinine trending down, 1.32.  BUN trending down, 46.  AST trending down.  WBC without significant change, 15.6.  Hemoglobin stable 11.3.  Platelets within  normal limits.  Repeat blood culture 7/9, no growth to date  Scheduled Meds: . folic acid  1 mg Oral Daily  . levothyroxine  150 mcg Oral QAC breakfast  . metoprolol tartrate  25 mg Oral BID  . phytonadione  5 mg Oral Daily  . saccharomyces boulardii  250 mg Oral BID  . vancomycin  125 mg Oral QID   Continuous Infusions: . cefTRIAXone (ROCEPHIN)  IV 2 g (12/16/17 2142)  . heparin 1,250 Units/hr (12/17/17 0010)  . pantoprozole (PROTONIX) infusion 8 mg/hr (12/17/17 0600)    Principal Problem:   Sepsis ([place]) Active Problems:   Hepatic cirrhosis (HCC)   Esophageal varices (HCC)   Atrial fibrillation with RVR (HCC)   Hypothyroidism   Acute lower UTI   DVT (deep venous thrombosis) (HCC)   PE (pulmonary thromboembolism) (HCC)   Bacteremia   Heme positive stool   Clostridium difficile colitis   Coagulopathy ([place])   LOS: 4 days   Time spent 35 minutes, greater than 50% in discussion with patient, GI, infectious disease and follow-up.

## 2017-12-17 NOTE — Progress Notes (Signed)
ANTICOAGULATION CONSULT NOTE - Follow-up Consult  Pharmacy Consult for Heparin Indication: pulmonary embolus  Patient Measurements: Height: 5\' 2"  (157.5 cm) Weight: 232 lb 2.3 oz (105.3 kg) IBW/kg (Calculated) : 50.1 Heparin Dosing Weight: 73.7 kg  Vital Signs: Temp: 98.3 F (36.8 C) (07/11 0417) Temp Source: Oral (07/11 0417) BP: 92/49 (07/11 0800) Pulse Rate: 99 (07/11 0800)  Labs: Recent Labs    12/14/17 1634  12/15/17 0333  12/15/17 1525 12/15/17 2200 12/16/17 0258 12/16/17 2201 12/17/17 0431 12/17/17 0825  HGB 10.8*  --  10.2*   < >  --  10.7* 10.6*  --  11.3*  --   HCT 32.5*  --  32.0*   < >  --  31.7* 32.1*  --  33.2*  --   PLT  --   --  375  --   --   --  370  --  247  --   APTT 59*  --  >200*  --  46*  --  148*  --   --   --   LABPROT  --   --  30.1*  --   --   --  20.5*  --   --   --   INR  --   --  2.90  --   --   --  1.77  --   --   --   HEPARINUNFRC  --    < > >2.20*  --  1.16*  --  0.91* 0.19*  --  0.68  CREATININE 1.62*  --  1.50*  --   --   --  1.32*  --   --   --    < > = values in this interval not displayed.    Estimated Creatinine Clearance: 42.6 mL/min (A) (by C-G formula based on SCr of 1.32 mg/dL (H)).   Medications:  Scheduled:   folic acid  1 mg Oral Daily   levothyroxine  150 mcg Oral QAC breakfast   metoprolol tartrate  25 mg Oral BID   phytonadione  5 mg Oral Daily   saccharomyces boulardii  250 mg Oral BID   vancomycin  125 mg Oral QID    Assessment: 45 YOF admitted with PE. Currently on Eliquis for Afib/PE/DVT per MD note. Last dose of Eliquis 7/6 1900. Pharmacy consulted to dose heparin for PE.  Heparin level this morning resulted as therapeutic (HL 0.68). No issues with the drip or bleeding noted per RN. CBC stable  Goal of Therapy:  Heparin level 0.3-0.7 units/ml Monitor platelets by anticoagulation protocol: Yes   Plan:  - Continue Heparin drip 1250 units/hr (12.5 ml/hr) - Will continue to monitor for any  signs/symptoms of bleeding and heparin level in 8 hours - Will monitor daily CBC  Thank you for allowing pharmacy to be a part of this patients care.  Juanell Fairly, PharmD PGY1 Pharmacy Resident Phone [phone] 12/17/2017  10:51 AM

## 2017-12-18 ENCOUNTER — Encounter: Payer: Self-pay | Admitting: *Deleted

## 2017-12-18 DIAGNOSIS — I5042 Chronic combined systolic (congestive) and diastolic (congestive) heart failure: Secondary | ICD-10-CM

## 2017-12-18 DIAGNOSIS — R110 Nausea: Secondary | ICD-10-CM

## 2017-12-18 DIAGNOSIS — B9689 Other specified bacterial agents as the cause of diseases classified elsewhere: Secondary | ICD-10-CM

## 2017-12-18 DIAGNOSIS — N3000 Acute cystitis without hematuria: Secondary | ICD-10-CM

## 2017-12-18 LAB — CBC
HCT: 33.1 % — ABNORMAL LOW (ref 36.0–46.0)
HEMOGLOBIN: 11.4 g/dL — AB (ref 12.0–15.0)
MCH: 31.4 pg (ref 26.0–34.0)
MCHC: 34.4 g/dL (ref 30.0–36.0)
MCV: 91.2 fL (ref 78.0–100.0)
Platelets: 185 10*3/uL (ref 150–400)
RBC: 3.63 MIL/uL — ABNORMAL LOW (ref 3.87–5.11)
RDW: 24.3 % — AB (ref 11.5–15.5)
WBC: 13.1 10*3/uL — ABNORMAL HIGH (ref 4.0–10.5)

## 2017-12-18 MED ORDER — GERHARDT'S BUTT CREAM
TOPICAL_CREAM | Freq: Three times a day (TID) | CUTANEOUS | Status: DC
  Administered 2017-12-18 – 2017-12-24 (×17): via TOPICAL
  Filled 2017-12-18 (×3): qty 1

## 2017-12-18 MED ORDER — FLUCONAZOLE 100 MG PO TABS
100.0000 mg | ORAL_TABLET | Freq: Every day | ORAL | Status: AC
  Administered 2017-12-18 – 2017-12-22 (×5): 100 mg via ORAL
  Filled 2017-12-18 (×5): qty 1

## 2017-12-18 MED ORDER — FUROSEMIDE 10 MG/ML IJ SOLN
40.0000 mg | Freq: Every day | INTRAMUSCULAR | Status: DC
  Administered 2017-12-18 – 2017-12-21 (×4): 40 mg via INTRAVENOUS
  Filled 2017-12-18 (×4): qty 4

## 2017-12-18 NOTE — Progress Notes (Signed)
[facility] Infectious [facility]  Date of Admission:  12/13/2017   Total days of antibiotics: 6         Day 6: Ceftriaxone        Day 5: Vancomycin          ASSESSMENT: Brittney Valentine is a 77 y/o female with a PMH of hepatic cirrhosis complicated by esophageal varices, splenomegaly, ascites and recent DVT with bilateral PE.   Brittney Valentine is responding well to treatment thus far. She is clinically improving (no fevers, less diarrhea) and her WBC # has decreased. Her repeat blood cultures are no growth to date. At this time, it would be okay to discontinue Ceftriaxone. It is most important  to treat her C. Diff since both the bacteremia and cystitis were asymptomatic. Without Ceftriaxone on board, Brittney Valentine will have a faster recovery from the C. Diff. She is tolerating oral Vancomycin well without major side effects. The plan for now is to continue Vancomycin for another 9 days (last dose should be on 12/26/2017), which will complete her 14 day treatment for first occurrence C. Difficile infection.   At this time, we will sign off. We are available for questions or additional concerns.   PLAN: 1. Discontinue Ceftriaxone  2. Continue oral Vancomycin for an additional 9 days till 12/26/2017  Active Problems:   Bacteremia due to Streptococcus (Viridans)    Clostridium difficile colitis   Bacterial cystitis   Hepatic cirrhosis (HCC)   Esophageal varices (HCC)   Atrial fibrillation with RVR (HCC)   Hypothyroidism   DVT (deep venous thrombosis) (HCC)   PE (pulmonary thromboembolism) (HCC)   Heme positive stool   Coagulopathy (HCC)   Scheduled Meds: . apixaban  5 mg Oral BID  . folic acid  1 mg Oral Daily  . furosemide  20 mg Oral Daily  . Gerhardt's butt cream   Topical TID  . levothyroxine  150 mcg Oral QAC breakfast  . pantoprazole  40 mg Oral Daily  . phytonadione  5 mg Oral Daily  . propranolol  20 mg Oral BID  . saccharomyces boulardii  250 mg Oral BID    . spironolactone  25 mg Oral Daily  . vancomycin  125 mg Oral QID   Continuous Infusions: . cefTRIAXone (ROCEPHIN)  IV Stopped (12/17/17 2328)   PRN Meds:.acetaminophen **OR** acetaminophen, ondansetron **OR** ondansetron (ZOFRAN) IV   SUBJECTIVE: Brittney Valentine reports she is feeling better today. Overnight, she did experience some mild nausea, but denies vomiting and abdominal pain. She is still having diarrhea but it has continually improved each day. She denies SOB, chest pain and leg pain. She still feels her legs are tight from the swelling but expects the swelling to improve once she is more mobile.   Review of Systems: Review of Systems  Constitutional: Negative for chills, fever and malaise/fatigue.  Respiratory: Negative for cough and shortness of breath.   Cardiovascular: Positive for leg swelling. Negative for chest pain.  Gastrointestinal: Positive for diarrhea and nausea. Negative for abdominal pain and vomiting.  Musculoskeletal: Negative for joint pain.    Allergies  Allergen Reactions  . Amoxicillin Palpitations    Has patient had a PCN reaction causing immediate rash, facial/tongue/throat swelling, SOB or lightheadedness with hypotension: No Has patient had a PCN reaction causing severe rash involving mucus membranes or skin necrosis: No Has patient had a PCN reaction that required hospitalization: No Has patient had a PCN reaction  occurring within the last 10 years: No If all of the above answers are "NO", then may proceed with Cephalosporin use.   . Ciprofloxacin Swelling  . Sulfonamide Derivatives Other (See Comments)    See little dots, skin feels like its burning  . Robaxin [Methocarbamol] Palpitations  . Verapamil Palpitations    OBJECTIVE: Vitals:   12/17/17 1957 12/17/17 2029 12/17/17 2100 12/18/17 0344  BP: (!) 88/67 (!) 92/58  108/68  Pulse:  80  93  Resp:  (!) 21  15  Temp:    98.1 F (36.7 C)  TempSrc:    Oral  SpO2:  99%  96%  Weight:    106.8 kg (235 lb 7.2 oz)   Height:       Body mass index is 43.06 kg/m.  Physical Exam  Constitutional: She appears well-developed and well-nourished. No distress.  Cardiovascular: Regular rhythm.  No extrasystoles are present. Tachycardia present.  No murmur heard. Pulmonary/Chest: Effort normal and breath sounds normal. No respiratory distress. She has no wheezes. She has no rales.  Abdominal: Soft. Bowel sounds are normal. There is no tenderness. There is no guarding.  Musculoskeletal: She exhibits edema (2+ pitting edema of bilateral lower extremities, with the left side worse than the right.). She exhibits no tenderness.  Neurological: She is alert.  Skin: Skin is dry. She is not diaphoretic.  Bilateral feet felt colder than upper body.     Lab Results Lab Results  Component Value Date   WBC 13.1 (H) 12/18/2017   HGB 11.4 (L) 12/18/2017   HCT 33.1 (L) 12/18/2017   MCV 91.2 12/18/2017   PLT 185 12/18/2017    Lab Results  Component Value Date   CREATININE 1.32 (H) 12/16/2017   BUN 46 (H) 12/16/2017   NA 138 12/16/2017   K 4.1 12/16/2017   CL 114 (H) 12/16/2017   CO2 19 (L) 12/16/2017    Lab Results  Component Value Date   ALT 33 12/16/2017   AST 56 (H) 12/16/2017   ALKPHOS 71 12/16/2017   BILITOT 1.0 12/16/2017     Microbiology: Recent Results (from the past 240 hour(s))  Blood culture (routine x 2)     Status: Abnormal   Collection Time: 12/13/17  5:47 PM  Result Value Ref Range Status   Specimen Description BLOOD LEFT HAND  Final   Special Requests   Final    BOTTLES DRAWN AEROBIC AND ANAEROBIC Blood Culture adequate volume   Culture  Setup Time   Final    GRAM POSITIVE COCCI IN CHAINS IN BOTH AEROBIC AND ANAEROBIC BOTTLES CRITICAL RESULT CALLED TO, READ BACK BY AND VERIFIED WITH: Asencion Islam PharmD 10:25 12/14/17 ([email or web address]) Performed at [facility], [address], [place], [place] [address]    Culture VIRIDANS STREPTOCOCCUS (A)  Final   Report  Status 12/16/2017 FINAL  Final   Organism ID, Bacteria VIRIDANS STREPTOCOCCUS  Final      Susceptibility   Viridans streptococcus - MIC*    TETRACYCLINE 0.5 SENSITIVE Sensitive     VANCOMYCIN 0.5 SENSITIVE Sensitive     CLINDAMYCIN <=0.25 SENSITIVE Sensitive     PENICILLIN Value in next row Intermediate      INTERMEDIATE0.25    CEFTRIAXONE Value in next row Sensitive      SENSITIVE0.25    * VIRIDANS STREPTOCOCCUS  Blood culture (routine x 2)     Status: Abnormal   Collection Time: 12/13/17  5:47 PM  Result Value Ref Range Status  Specimen Description BLOOD LEFT HAND  Final   Special Requests   Final    BOTTLES DRAWN AEROBIC AND ANAEROBIC Blood Culture adequate volume   Culture  Setup Time   Final    GRAM POSITIVE COCCI IN CHAINS IN BOTH AEROBIC AND ANAEROBIC BOTTLES CRITICAL VALUE NOTED.  VALUE IS CONSISTENT WITH PREVIOUSLY REPORTED AND CALLED VALUE.    Culture (A)  Final    VIRIDANS STREPTOCOCCUS SUSCEPTIBILITIES PERFORMED ON PREVIOUS CULTURE WITHIN THE LAST 5 DAYS. Performed at [facility], [place] [address], [place], [place] [address]    Report Status 12/16/2017 FINAL  Final  Blood Culture ID Panel (Reflexed)     Status: Abnormal   Collection Time: 12/13/17  5:47 PM  Result Value Ref Range Status   Enterococcus species NOT DETECTED NOT DETECTED Final   Listeria monocytogenes NOT DETECTED NOT DETECTED Final   Staphylococcus species NOT DETECTED NOT DETECTED Final   Staphylococcus aureus NOT DETECTED NOT DETECTED Final   Streptococcus species DETECTED (A) NOT DETECTED Final    Comment: Not Enterococcus species, Streptococcus agalactiae, Streptococcus pyogenes, or Streptococcus pneumoniae. CRITICAL RESULT CALLED TO, READ BACK BY AND VERIFIED WITH: Asencion Islam PharmD 10:25 12/14/17 ([email or web address])    Streptococcus agalactiae NOT DETECTED NOT DETECTED Final   Streptococcus pneumoniae NOT DETECTED NOT DETECTED Final   Streptococcus pyogenes NOT DETECTED NOT DETECTED Final    Acinetobacter baumannii NOT DETECTED NOT DETECTED Final   Enterobacteriaceae species NOT DETECTED NOT DETECTED Final   Enterobacter cloacae complex NOT DETECTED NOT DETECTED Final   Escherichia coli NOT DETECTED NOT DETECTED Final   Klebsiella oxytoca NOT DETECTED NOT DETECTED Final   Klebsiella pneumoniae NOT DETECTED NOT DETECTED Final   Proteus species NOT DETECTED NOT DETECTED Final   Serratia marcescens NOT DETECTED NOT DETECTED Final   Haemophilus influenzae NOT DETECTED NOT DETECTED Final   Neisseria meningitidis NOT DETECTED NOT DETECTED Final   Pseudomonas aeruginosa NOT DETECTED NOT DETECTED Final   Candida albicans NOT DETECTED NOT DETECTED Final   Candida glabrata NOT DETECTED NOT DETECTED Final   Candida krusei NOT DETECTED NOT DETECTED Final   Candida parapsilosis NOT DETECTED NOT DETECTED Final   Candida tropicalis NOT DETECTED NOT DETECTED Final    Comment: Performed at [facility], [place] [address] [place], [place] [address]  Culture, Urine     Status: Abnormal   Collection Time: 12/13/17  6:00 PM  Result Value Ref Range Status   Specimen Description URINE, RANDOM  Final   Special Requests   Final    NONE Performed at [facility], [place] [address] [place], [place] [address]    Culture (A)  Final    >=100,000 COLONIES/mL KLEBSIELLA PNEUMONIAE >=100,000 COLONIES/mL ENTEROCOCCUS FAECALIS    Report Status 12/17/2017 FINAL  Final   Organism ID, Bacteria KLEBSIELLA PNEUMONIAE (A)  Final   Organism ID, Bacteria ENTEROCOCCUS FAECALIS (A)  Final      Susceptibility   Enterococcus faecalis - MIC*    AMPICILLIN <=2 SENSITIVE Sensitive     LEVOFLOXACIN 1 SENSITIVE Sensitive     NITROFURANTOIN <=16 SENSITIVE Sensitive     VANCOMYCIN 1 SENSITIVE Sensitive     * >=100,000 COLONIES/mL ENTEROCOCCUS FAECALIS   Klebsiella pneumoniae - MIC*    AMPICILLIN >=32 RESISTANT Resistant     CEFAZOLIN <=4 SENSITIVE Sensitive     CEFTRIAXONE <=1 SENSITIVE Sensitive      CIPROFLOXACIN 1 SENSITIVE Sensitive     GENTAMICIN <=1 SENSITIVE Sensitive  IMIPENEM <=0.25 SENSITIVE Sensitive     NITROFURANTOIN 64 INTERMEDIATE Intermediate     TRIMETH/SULFA >=320 RESISTANT Resistant     AMPICILLIN/SULBACTAM 4 SENSITIVE Sensitive     PIP/TAZO <=4 SENSITIVE Sensitive     Extended ESBL NEGATIVE Sensitive     * >=100,000 COLONIES/mL KLEBSIELLA PNEUMONIAE  C difficile quick scan w PCR reflex     Status: Abnormal   Collection Time: 12/14/17  6:43 PM  Result Value Ref Range Status   C Diff antigen POSITIVE (A) NEGATIVE Final   C Diff toxin POSITIVE (A) NEGATIVE Final   C Diff interpretation Toxin producing C. difficile detected.  Final    Comment: CRITICAL RESULT CALLED TO, READ BACK BY AND VERIFIED WITH: Miguel Dibble RN 2006 12/14/17 A BROWNING Performed at [facility], [place] [address] [place], [place] [address]   Culture, blood (routine x 2)     Status: None (Preliminary result)   Collection Time: 12/15/17  3:25 AM  Result Value Ref Range Status   Specimen Description BLOOD LEFT ANTECUBITAL  Final   Special Requests   Final    BOTTLES DRAWN AEROBIC ONLY Blood Culture results may not be optimal due to an inadequate volume of blood received in culture bottles   Culture   Final    NO GROWTH 2 DAYS Performed at [facility], [place] [address], [place], [place] [address]    Report Status PENDING  Incomplete  Culture, blood (routine x 2)     Status: None (Preliminary result)   Collection Time: 12/15/17  3:30 AM  Result Value Ref Range Status   Specimen Description BLOOD LEFT ANTECUBITAL  Final   Special Requests   Final    BOTTLES DRAWN AEROBIC ONLY Blood Culture results may not be optimal due to an inadequate volume of blood received in culture bottles   Culture   Final    NO GROWTH 2 DAYS Performed at [facility], [place] [address] [place], [place] [address]    Report Status PENDING  Incomplete    Jose Persia, MS4 Dr. Michel Bickers,  MD [facility] [phone] pager   [phone] cell 12/18/2017, 10:50 AM

## 2017-12-18 NOTE — Consult Note (Signed)
[facility] Nurse wound consult note Reason for Consult: Moisture associated skin damage (MASD), specifically, incontinence associated dermatitis (IAD) and intertriginous dermatitis (ITD) Wound type: Moisture Pressure Injury POA: NA Measurement:Bilateral inguinal and subpannicular areas with ITD and perineal and perianal with IAD.  The perianal area is additionally suggestive of fungal overgrowth. Wound bed: erythematous, edematous, with scattered shallow pinpoint areas of tissue loss. The perianal area has three satellite lesions consistent with fungal overgrowth. The subpannicular areas are with linear full thickness open areas consistent with fungal overgrowth. Drainage (amount, consistency, odor) scant serous Periwound: intact, dry. Dressing procedure/placement/frequency: A mattress replacement is provided for management of microclimate.  Our house antimicrobial textile, InterDry Ag+ is offered for skin fold moisture management. Gerhart's Butt Cream (a 1:1:1 compounded prescriptive of zinc oxide, hydrocortisone and antifungal).  If you agree, please order systemic antifungal (eg., Diflucan) to expedite the resolution of the fungal overgrowth.  [facility] nursing team will not follow, but will remain available to this patient, the nursing and medical teams.  Please re-consult if needed. Thanks, Maudie Flakes, MSN, RN, [place], Ar[facility]  Pager# [phone]

## 2017-12-18 NOTE — Progress Notes (Signed)
PROGRESS NOTE  GEARL KIMBROUGH KXF:[id number] DOB: 1941-04-18 DOA: 12/13/2017 PCP: Unk Pinto, MD  Brief Narrative: 77 year old woman PMH cirrhosis, atrial fibrillation, PE, DVT, presented with weakness, nausea, vomiting.  Admitted for suspected sepsis secondary to UTI, atrial fibrillation with rapid ventricular response  Discharged 6/30, treated for bilateral PE, left lower extremity DVT in the context of recent kidney stone removal, patient off Eliquis in anticipation of colonoscopy and EGD.  Assessment/Plan C diff colitis --Improving.  Continue oral vancomycin for 9 more days.  Sepsis secondary to Klebsiella, Enterococcus faecalis UTI --Remains afebrile, vital signs stable.  Infectious disease recommending discontinuation of IV antibiotics.  Strep viridans bacteremia  --Per infectious disease, bacteremia believed cured.  Stop IV antibiotics.  Atrial fibrillation.  Rate controlled. --Continue apixaban per GI.  GI has recommended nonselective beta-blocker, propranolol.  Avoid amiodarone given hypothyroidism, elevated LFTs per cardiology.   Anemia acute on chronic; folate deficiency.  Fecal occult blood positive. --Hemoglobin has remained stable.  Continue folate.  EGD without evidence of bleeding.  Report below. --Continue PPI per GI  Acute kidney injury --Expect spontaneous resolution  NASH cirrhosis, esophageal varices, ascites, splenomegaly, coagulopathy/elevated INR --per oncology: Continue Vit K 5mg  po daily for 1-2 weeks   Chronic combined diastolic, systolic CHF with significant volume overload. --Appears stable from pulmonary standpoint.  Has significant volume overload, fluid balance reportedly 6 L positive.  Blood pressure stable.  Will initiate IV diuresis.  Appears stable from a pulmonary standpoint.    Hypothyroidism, abnormal TSH, follow-up as an outpatient  Recent dx PE/DVT --Continue apixaban  Used to improve.  Continue oral vancomycin.  IV diuresis.   Anticipate discharge home next 48 hours as volume status improves.  Will discontinue telemetry.  Add Diflucan to help clear cutaneous candidiasis.  DVT prophylaxis: Apixaban Code Status: partial Family Communication: none Disposition Plan: resume HHPT/RN at discharge   [name], MD  [facility] Direct contact: [phone] --Via [place]  --[email or web address]; password [email or web address]  7PM-7AM contact night coverage as above 12/18/2017, 4:06 PM  LOS: 5 days   Consultants:  Cardiology  GI  Hematology   Procedures:  EGD Impression:               - Grade II esophageal varices.                           - Esophagogastric landmarks identified.                           - Portal hypertensive gastropathy.                           - Normal examined duodenum.                           - No specimens collected.  Antimicrobials:  Ceftriaxone  Vancomycin  Interval history/Subjective: Feels fine today.  Reports diarrhea improving.  No other complaints.  Objective: Vitals:  Vitals:   12/17/17 2029 12/18/17 0344  BP: (!) 92/58 108/68  Pulse: 80 93  Resp: (!) 21 15  Temp:  98.1 F (36.7 C)  SpO2: 99% 96%    Exam: Constitutional:   . Appears calm and comfortable Respiratory:  . CTA bilaterally, no w/r/r.  . Respiratory effort normal Cardiovascular:  . RRR, no m/r/g . 3+ BLE extremity edema   Psychiatric:  .  Mental status o Mood, affect appropriate  I have personally reviewed the following:   Labs:  Urine output 475, 2 voids  Stools x5  WBC trending down, 13.1.  Hemoglobin stable 11.4.  Scheduled Meds: . apixaban  5 mg Oral BID  . fluconazole  100 mg Oral Daily  . folic acid  1 mg Oral Daily  . furosemide  40 mg Intravenous Daily  . Gerhardt's butt cream   Topical TID  . levothyroxine  150 mcg Oral QAC breakfast  . pantoprazole  40 mg Oral Daily  . phytonadione  5 mg Oral Daily  . propranolol  20 mg Oral BID  . saccharomyces boulardii  250 mg  Oral BID  . spironolactone  25 mg Oral Daily  . vancomycin  125 mg Oral QID   Continuous Infusions:   Active Problems:   Hepatic cirrhosis (HCC)   Esophageal varices (HCC)   Atrial fibrillation with RVR (HCC)   Hypothyroidism   Bacterial cystitis   DVT (deep venous thrombosis) (HCC)   PE (pulmonary thromboembolism) ([place])   Bacteremia due to Streptococcus (Viridans)    Heme positive stool   Clostridium difficile colitis   Coagulopathy ([place])   LOS: 5 days

## 2017-12-18 NOTE — Consult Note (Addendum)
   [facility] CM Inpatient Consult   12/18/2017  KHUSHBU PIPPEN 03-Sep-1940 [id number]   [facility] Care Management follow up.  Spoke with Ms. Escamilla about [place] Management services. She is agreeable and [facility] Care Management written consent obtained. [facility] folder provided.  Ms. Carmickle reports she lives alone.  Denies having concerns with medications.  States her brother takes her to MD appointments but "we have conflicts too". States she could use the additional support from [place] Management for transportation needs. Confirmed best contact number for Ms. Manahan is [phone]. Confirmed Primary Care MD is Dr. Melford Aase.  Discussed [facility] Care Management will not interfere or replace services provided by home health. Inpatient RNCM confirms patient will have [place].  Will make referral for [place] for transition of care and [facility] CSW for transportation assistance.  Ms. Damman has medical history of CHF, afib with rvr, sepsis, PE, UTI, cdiff.   [name], MSN-Ed, RN,BSN [facility] Liaison [phone]

## 2017-12-19 DIAGNOSIS — E8779 Other fluid overload: Secondary | ICD-10-CM

## 2017-12-19 LAB — BASIC METABOLIC PANEL
ANION GAP: 5 (ref 5–15)
BUN: 34 mg/dL — AB (ref 8–23)
CALCIUM: 8.7 mg/dL — AB (ref 8.9–10.3)
CO2: 21 mmol/L — ABNORMAL LOW (ref 22–32)
Chloride: 112 mmol/L — ABNORMAL HIGH (ref 98–111)
Creatinine, Ser: 1.21 mg/dL — ABNORMAL HIGH (ref 0.44–1.00)
GFR calc Af Amer: 50 mL/min — ABNORMAL LOW (ref >60)
GFR, EST NON AFRICAN AMERICAN: 43 mL/min — AB (ref >60)
GLUCOSE: 115 mg/dL — AB (ref 70–99)
Potassium: 4.1 mmol/L (ref 3.5–5.1)
Sodium: 138 mmol/L (ref 135–145)

## 2017-12-19 NOTE — Progress Notes (Signed)
PROGRESS NOTE  Brittney Valentine YHC:[id number] DOB: April 16, 1941 DOA: 12/13/2017 PCP: Unk Pinto, MD  Brief Narrative: 77 year old woman PMH cirrhosis, atrial fibrillation, PE, DVT, presented with weakness, nausea, vomiting.  Admitted for suspected sepsis secondary to UTI, atrial fibrillation with rapid ventricular response  Discharged 6/30, treated for bilateral PE, left lower extremity DVT in the context of recent kidney stone removal, patient off Eliquis in anticipation of colonoscopy and EGD.  Assessment/Plan C diff colitis --Slowly improving.  Continue oral vancomycin 8 more days.  Chronic combined diastolic, systolic CHF with significant volume overload. --Improved urine output but still has massive volume overload lower extremities.  Patient lives alone.  We will continue IV diuresis given her excellent response until she is closer to euvolemic.  Sepsis secondary to Klebsiella, Enterococcus faecalis UTI --Remains afebrile.  Has completed antibiotics.  No evidence of sepsis At this point.  Strep viridans bacteremia  --Antibiotics stopped 7/12, per infectious disease.  Atrial fibrillation.  Rate controlled. --Continue apixaban, propranolol per GI.  Avoid amiodarone given hypothyroidism, elevated LFTs per cardiology.   Anemia acute on chronic; folate deficiency.  Fecal occult blood positive. --Hemoglobin has remained stable.  Continue folate.  EGD without evidence of bleeding.  Report below. --Continue PPI  Acute kidney injury --Expect spontaneous resolutio continues to trend towards normal  NASH cirrhosis, esophageal varices, ascites, splenomegaly, coagulopathy/elevated INR --per oncology: Continue Vit K 5mg  po daily for 1-2 weeks   Hypothyroidism, abnormal TSH, follow-up as an outpatient  Recent dx PE/DVT --Continue apixaban  .  DVT prophylaxis: Apixaban Code Status: partial Family Communication: none Disposition Plan: resume HHPT/RN at discharge when volume  status improved   [name], MD  [facility] Direct contact: [phone] --Via [place]  --[email or web address]; password [email or web address]  7PM-7AM contact night coverage as above 12/19/2017, 12:02 PM  LOS: 6 days   Consultants:  Cardiology  GI  Hematology   Procedures:  EGD Impression:               - Grade II esophageal varices.                           - Esophagogastric landmarks identified.                           - Portal hypertensive gastropathy.                           - Normal examined duodenum.                           - No specimens collected.  Antimicrobials:  Ceftriaxone  Vancomycin  Interval history/Subjective: Feels better, still having some diarrhea.  Objective: Vitals:  Vitals:   12/19/17 0313 12/19/17 0855  BP: 102/64 113/75  Pulse: 69 91  Resp: (!) 24   Temp: 97.7 F (36.5 C)   SpO2: 96%     Exam: Constitutional:   . Appears calm and comfortable Respiratory:  . CTA bilaterally, no w/r/r.  . Respiratory effort normal.  Cardiovascular:  . RRR, no m/r/g . 3+ bilateral LE extremity edema   Psychiatric:  . Mental status o Mood, affect appropriate  I have personally reviewed the following:   Labs:  Urine output 1850.  -1.3 L last 24 hours.  Still +5.5 L since admission.  Stools x4  Potassium within normal  limits.  CO2 up to 21.  BUN and creatinine trending down, 34, 1.21 respectively.  Scheduled Meds: . apixaban  5 mg Oral BID  . fluconazole  100 mg Oral Daily  . folic acid  1 mg Oral Daily  . furosemide  40 mg Intravenous Daily  . Gerhardt's butt cream   Topical TID  . levothyroxine  150 mcg Oral QAC breakfast  . pantoprazole  40 mg Oral Daily  . phytonadione  5 mg Oral Daily  . propranolol  20 mg Oral BID  . saccharomyces boulardii  250 mg Oral BID  . spironolactone  25 mg Oral Daily  . vancomycin  125 mg Oral QID   Continuous Infusions:   Active Problems:   Hepatic cirrhosis (HCC)   Esophageal varices  (HCC)   Atrial fibrillation with RVR (HCC)   Hypothyroidism   Bacterial cystitis   DVT (deep venous thrombosis) (HCC)   PE (pulmonary thromboembolism) ([place])   Bacteremia due to Streptococcus (Viridans)    Heme positive stool   Clostridium difficile colitis   Coagulopathy ([place])   LOS: 6 days

## 2017-12-19 NOTE — Evaluation (Signed)
Physical Therapy Evaluation Patient Details Name: Brittney Valentine MRN: [id number] DOB: 12/11/1940 Today's Date: 12/19/2017   History of Present Illness  Brittney Valentine is a 77 y.o. female with medical history significant of liver cirrhosis, ascites, esophageal varicose, portal hypertension, diverticulosis, obesity, PAF not on anticoagulants, CHF with EF of 35%, kidney stone,  Presented with nausea and vomiting. Admitted for suspected sepsis secondary to UTI, atrial fibrillation with rapid ventricular response   Clinical Impression  Since recent hospital admission, pt participating in [place] and was ambulating with RW and having neighbor assist with shopping and food preparation. Pt currently limited in mobility by dizziness and decreased strength and endurance. Pt currently requires modA for bed mobility and for stand pivot transfers to and from [facility]. Pt unable to attempt gait secondary to dizziness. Pt BP did not indicate a hypostatic response with positional change (see General Comments). PT recommending SNF level rehab at discharge to return to PLOF. PT will continue to follow acutely.     Follow Up Recommendations SNF    Equipment Recommendations  (TBD at next venue)    Recommendations for Other Services       Precautions / Restrictions Restrictions Weight Bearing Restrictions: No      Mobility  Bed Mobility Overal bed mobility: Needs Assistance Bed Mobility: Sit to Supine;Supine to Sit     Supine to sit: Min assist Sit to supine: Mod assist   General bed mobility comments: minA for trunk to upright and modA for LE management into bed   Transfers Overall transfer level: Needs assistance Equipment used: Rolling walker (2 wheeled) Transfers: Sit to/from [facility] Sit to Stand: Mod assist;Min assist Stand pivot transfers: Min assist       General transfer comment: modA to attempt to stand with RW, with HHA able to come to standing from med with modA, minA  for sit>stand from [facility], min A for stand step transfer <>BSC  Ambulation/Gait             General Gait Details: did not attempt secondary to increased dizziness      Balance Overall balance assessment: Needs assistance Sitting-balance support: No upper extremity supported;Feet supported Sitting balance-Leahy Scale: Fair     Standing balance support: Bilateral upper extremity supported Standing balance-Leahy Scale: Poor Standing balance comment: required therapist assist                              Pertinent Vitals/Pain Pain Assessment: No/denies pain    Home Living Family/patient expects to be discharged to:: Private residence Living Arrangements: Alone Available Help at Discharge: Neighbor;Available PRN/intermittently Type of Home: House Home Access: Stairs to enter Entrance Stairs-Rails: Psychiatric nurse of Steps: 3 Home Layout: One level Home Equipment: Shower seat;Hand held shower head;Grab bars - tub/shower      Prior Function Level of Independence: Independent with assistive device(s)         Comments: has been using RW since last admission         Extremity/Trunk Assessment   Upper Extremity Assessment Upper Extremity Assessment: Generalized weakness    Lower Extremity Assessment Lower Extremity Assessment: Generalized weakness(decreased ankle ROM 2' edema)       Communication   Communication: No difficulties  Cognition Arousal/Alertness: Awake/alert Behavior During Therapy: WFL for tasks assessed/performed Overall Cognitive Status: Within Functional Limits for tasks assessed  General Comments: slightly delayed response to questions      General Comments General comments (skin integrity, edema, etc.): SaO2 on RA throughout session >95%O2, HR max 113 bpm, supine BP 110/73, sitting BP 125/56, unable to maintain standing long enough for BP, with return to supine  112/22        Assessment/Plan    PT Assessment Patient needs continued PT services  PT Problem List Decreased strength;Decreased range of motion;Decreased activity tolerance;Decreased balance;Decreased mobility;Cardiopulmonary status limiting activity       PT Treatment Interventions DME instruction;Gait training;Stair training;Functional mobility training;Therapeutic activities;Therapeutic exercise;Balance training;Cognitive remediation;Patient/family education    PT Goals (Current goals can be found in the Care Plan section)  Acute Rehab PT Goals Patient Stated Goal: to feel better PT Goal Formulation: With patient Time For Goal Achievement: 01/02/18 Potential to Achieve Goals: Fair    Frequency Min 2X/week   Barriers to discharge Decreased caregiver support;Other (comment)         AM-PAC PT "6 Clicks" Daily Activity  Outcome Measure Difficulty turning over in bed (including adjusting bedclothes, sheets and blankets)?: A Little Difficulty moving from lying on back to sitting on the side of the bed? : Unable Difficulty sitting down on and standing up from a chair with arms (e.g., wheelchair, bedside commode, etc,.)?: Unable Help needed moving to and from a bed to chair (including a wheelchair)?: A Lot Help needed walking in hospital room?: A Lot Help needed climbing 3-5 steps with a railing? : Total 6 Click Score: 10    End of Session Equipment Utilized During Treatment: Gait belt Activity Tolerance: Patient limited by fatigue;Other (comment)(limited by dizziness with movement) Patient left: in bed;with call bell/phone within reach Nurse Communication: Mobility status PT Visit Diagnosis: Unsteadiness on feet (R26.81);Other abnormalities of gait and mobility (R26.89);Muscle weakness (generalized) (M62.81);Difficulty in walking, not elsewhere classified (R26.2);Dizziness and giddiness (R42)    Time: 1425-1450 PT Time Calculation (min) (ACUTE ONLY): 25 min   Charges:    PT Evaluation $PT Eval Moderate Complexity: 1 Mod PT Treatments $Therapeutic Activity: 8-22 mins   PT G Codes:        Reighn Kaplan B. Migdalia Dk PT, DPT Acute Rehabilitation  [phone] Pager [phone]    [place] 12/19/2017, 3:01 PM

## 2017-12-20 DIAGNOSIS — N179 Acute kidney failure, unspecified: Secondary | ICD-10-CM

## 2017-12-20 LAB — BASIC METABOLIC PANEL WITH GFR
Anion gap: 4 — ABNORMAL LOW (ref 5–15)
BUN: 29 mg/dL — ABNORMAL HIGH (ref 8–23)
CO2: 21 mmol/L — ABNORMAL LOW (ref 22–32)
Calcium: 8.5 mg/dL — ABNORMAL LOW (ref 8.9–10.3)
Chloride: 114 mmol/L — ABNORMAL HIGH (ref 98–111)
Creatinine, Ser: 1.07 mg/dL — ABNORMAL HIGH (ref 0.44–1.00)
GFR calc Af Amer: 58 mL/min — ABNORMAL LOW
GFR calc non Af Amer: 50 mL/min — ABNORMAL LOW
Glucose, Bld: 98 mg/dL (ref 70–99)
Potassium: 4.1 mmol/L (ref 3.5–5.1)
Sodium: 139 mmol/L (ref 135–145)

## 2017-12-20 MED ORDER — PHYTONADIONE 5 MG PO TABS
5.0000 mg | ORAL_TABLET | Freq: Every day | ORAL | Status: AC
  Administered 2017-12-21 – 2017-12-24 (×4): 5 mg via ORAL
  Filled 2017-12-20 (×5): qty 1

## 2017-12-20 NOTE — Progress Notes (Signed)
PROGRESS NOTE  Brittney Valentine RDE:[id number] DOB: 06/25/1940 DOA: 12/13/2017 PCP: Unk Pinto, MD  Brief Narrative: 77 year old woman PMH cirrhosis, atrial fibrillation, PE, DVT, presented with weakness, nausea, vomiting.  Admitted for suspected sepsis secondary to UTI, atrial fibrillation with rapid ventricular response  Discharged 6/30, treated for bilateral PE, left lower extremity DVT in the context of recent kidney stone removal, patient off Eliquis in anticipation of colonoscopy and EGD.  Assessment/Plan C diff colitis --Still some loose stools but overall improving.  7 more days oral vancomycin.  Chronic combined diastolic, systolic CHF with significant volume overload. --Urine output unrecorded but has decreased lower extremity edema although there is still massive volume overload.  Continue IV diuresis.  Sepsis secondary to Klebsiella, Enterococcus faecalis UTI --Asymptomatic, afebrile, hemodynamic stable.  Completed antibiotics.  Strep viridans bacteremia  --Completed antibiotics per infectious disease.  Atrial fibrillation.  Rate controlled. --We will continue apixaban, propranolol.  Avoid amiodarone secondary to elevated LFTs, hypothyroidism.  Anemia acute on chronic; folate deficiency.  Fecal occult blood positive. --Hemoglobin stable, continue folate EGD without evidence of bleeding.  Report below. --Continue PPI  Acute kidney injury --Essentially resolved  NASH cirrhosis, esophageal varices, ascites, splenomegaly, coagulopathy/elevated INR --per oncology: Continue Vit K 5mg  po daily 1 additional week  Hypothyroidism, abnormal TSH, follow-up as an outpatient  Recent dx PE/DVT --Continue apixaban  . DVT prophylaxis: Apixaban Code Status: partial Family Communication: none Disposition Plan: Therapy is recommended skilled nursing facility.  Patient agreeable.  Anticipate discharge likely 48 hours.   [name], MD  [facility] Direct  contact: [phone] --Via amion app [place]  --[email or web address]; password [email or web address]  7PM-7AM contact night coverage as above 12/20/2017, 11:19 AM  LOS: 7 days   Consultants:  Cardiology  GI  Hematology   Procedures:  EGD Impression:               - Grade II esophageal varices.                           - Esophagogastric landmarks identified.                           - Portal hypertensive gastropathy.                           - Normal examined duodenum.                           - No specimens collected.  Antimicrobials:  Ceftriaxone  Vancomycin  Interval history/Subjective: Overall feeling better.  Still has lower extremity edema.  Diarrhea improving.  Objective: Vitals:  Vitals:   12/20/17 0726 12/20/17 0856  BP: 101/64   Pulse: 90 89  Resp: 16   Temp: 97.6 F (36.4 C)   SpO2: 96%     Exam: Constitutional:   . Appears calm and comfortable Cardiovascular:  . RRR, no m/r/g . Still has 3+ bilateral LE extremity edema    Respiratory clear to auscultation bilaterally.  No wheezes, rales or rhonchi.  Normal respiratory effort. Psychiatric:  . Mental status o Mood, affect appropriate  I have personally reviewed the following:   Labs:  Multiple voids, not recorded specific amount  9 stools recorded  Potassium normal.  BUN and creatinine trending down, 29, 1.07 respectively  Scheduled Meds: . apixaban  5 mg Oral BID  .  fluconazole  100 mg Oral Daily  . folic acid  1 mg Oral Daily  . furosemide  40 mg Intravenous Daily  . Gerhardt's butt cream   Topical TID  . levothyroxine  150 mcg Oral QAC breakfast  . pantoprazole  40 mg Oral Daily  . propranolol  20 mg Oral BID  . saccharomyces boulardii  250 mg Oral BID  . spironolactone  25 mg Oral Daily  . vancomycin  125 mg Oral QID   Continuous Infusions:   Active Problems:   Hepatic cirrhosis (HCC)   Esophageal varices (HCC)   Atrial fibrillation with RVR (HCC)   Hypothyroidism   Bacterial cystitis   DVT  (deep venous thrombosis) (HCC)   PE (pulmonary thromboembolism) ([place])   Bacteremia due to Streptococcus (Viridans)    Heme positive stool   Clostridium difficile colitis   Coagulopathy ([place])   LOS: 7 days

## 2017-12-21 LAB — CULTURE, BLOOD (ROUTINE X 2)
Culture: NO GROWTH
Culture: NO GROWTH

## 2017-12-21 LAB — BASIC METABOLIC PANEL
ANION GAP: 5 (ref 5–15)
BUN: 26 mg/dL — AB (ref 8–23)
CHLORIDE: 111 mmol/L (ref 98–111)
CO2: 21 mmol/L — ABNORMAL LOW (ref 22–32)
Calcium: 8.6 mg/dL — ABNORMAL LOW (ref 8.9–10.3)
Creatinine, Ser: 1.16 mg/dL — ABNORMAL HIGH (ref 0.44–1.00)
GFR calc Af Amer: 52 mL/min — ABNORMAL LOW (ref >60)
GFR, EST NON AFRICAN AMERICAN: 45 mL/min — AB (ref >60)
Glucose, Bld: 119 mg/dL — ABNORMAL HIGH (ref 70–99)
Potassium: 3.9 mmol/L (ref 3.5–5.1)
SODIUM: 137 mmol/L (ref 135–145)

## 2017-12-21 MED ORDER — FUROSEMIDE 10 MG/ML IJ SOLN
40.0000 mg | Freq: Two times a day (BID) | INTRAMUSCULAR | Status: DC
  Administered 2017-12-21 – 2017-12-24 (×6): 40 mg via INTRAVENOUS
  Filled 2017-12-21 (×6): qty 4

## 2017-12-21 NOTE — Clinical Social Work Note (Signed)
Clinical Social Work Assessment  Patient Details  Name: Brittney Valentine MRN: [id number] Date of Birth: 07/30/1940  Date of referral:  12/21/17               Reason for consult:  Discharge Planning, Facility Placement                Permission sought to share information with:  Family Supports Permission granted to share information::  Yes, Verbal Permission Granted  Name::     Lacosta Hargan  Agency::  snf  Relationship::  [phone]  Contact Information:  brother  Housing/Transportation Living arrangements for the past 2 months:  Single Family Home Source of Information:  Patient Patient Interpreter Needed:  None Criminal Activity/Legal Involvement Pertinent to Current Situation/Hospitalization:  No - Comment as needed Significant Relationships:  Siblings, Other Family Members Lives with:  Self Do you feel safe going back to the place where you live?  Yes Need for family participation in patient care:  Yes (Comment)  Care giving concerns:  No family at bedside. Patient stated she lives by herself but has support from brother and other family members.   Social Worker assessment / plan:  CSW met patient at bedside to discuss discharge plan and offer support. Patient stated she is agreeable to go to rehab so she "can get better quicker". Patient stated she lives at home by herself and has never been to a rehab before in the past. CSW went over process of going to a rehab facility. Patient gave CSW verbal permission to fax patient out to rehab facilities in [place]. CSW to follow up with pateint once bed offers are available   Employment status:  Retired Nurse, adult PT Recommendations:  [place] / Referral to community resources:  [place]  Patient/Family's Response to care:  Patient appreciates CSW role in care  Patient/Family's Understanding of and Emotional Response to Diagnosis,  Current Treatment, and Prognosis:  Patient agreeable with discharge plan  Emotional Assessment Appearance:  Appears stated age Attitude/Demeanor/Rapport:  Engaged Affect (typically observed):  Accepting, Pleasant Orientation:  Oriented to Place, Oriented to  Time, Oriented to Situation Alcohol / Substance use:  Not Applicable Psych involvement (Current and /or in the community):  No (Comment)  Discharge Needs  Concerns to be addressed:  Care Coordination Readmission within the last 30 days:  No Current discharge risk:  Dependent with Mobility, Lives alone Barriers to Discharge:  Continued Medical Work up, No SNF bed, [place], LCSW 12/21/2017, 11:11 AM

## 2017-12-21 NOTE — NC FL2 (Signed)
[place] LEVEL OF CARE SCREENING TOOL     IDENTIFICATION  Patient Name: Brittney Valentine Birthdate: 1940/11/17 Sex: female Admission Date (Current Location): 12/13/2017  [facility] and [place] Number:  Herbalist and Address:  [facility], [place] [address], [place], [place] [address]      Provider Number: [id number]  Attending Physician Name and Address:  Samuella Cota, MD  Relative Name and Phone Number:  Adina Puzzo,  [phone]    Current Level of Care: Hospital Recommended Level of Care: [place] Prior Approval Number:    Date Approved/Denied:   PASRR Number: [id number]  Discharge Plan: SNF    Current Diagnoses: Patient Active Problem List   Diagnosis Date Noted  . Coagulopathy ([place])   . Bacteremia due to Streptococcus ([place])    . Heme positive stool   . Clostridium difficile colitis   . GERD (gastroesophageal reflux disease) 12/04/2017  . DVT (deep venous thrombosis) ([place]) 12/04/2017  . PE (pulmonary thromboembolism) ([place]) 12/04/2017  . Pleural effusion 12/04/2017  . Acute pulmonary embolism without acute cor pulmonale (HCC)   . Ascites 09/29/2017  . Chronic diarrhea 09/04/2017  . Long term current use of anticoagulant - apixaban 09/04/2017  . Fatigue 05/28/2017  . Chronic combined systolic and diastolic CHF (congestive heart failure) ([place])   . Dilated cardiomyopathy ([place])   . Contraindication to anticoagulation therapy   . Bacterial cystitis 04/11/2017  . Reactive airway disease, mild intermittent, uncomplicated 03/25/5101  . Obstructive sleep apnea 01/08/2015  . Essential hypertension 12/21/2014  . Prediabetes 12/21/2014  . Hypothyroidism 12/21/2014  . Encounter for Medicare annual wellness exam 12/20/2014  . Medication management 09/12/2014  . Vitamin D deficiency 09/12/2014  . Atrial fibrillation with RVR ([place]) 05/11/2014  . Esophageal varices ([place]) 02/09/2014  . Left sided  sciatica 02/09/2014  . History of colonic polyps 03/25/2012  . Obesity 09/27/2009  . History of small bowel obstruction 12/31/2007  . Hepatic cirrhosis ([place]) 12/31/2007  . PORTAL HYPERTENSION 12/31/2007    Orientation RESPIRATION BLADDER Height & Weight     Self, Time, Situation, Place  Normal Incontinent Weight: 225 lb 1.4 oz (102.1 kg) Height:  5\' 2"  (157.5 cm)  BEHAVIORAL SYMPTOMS/MOOD NEUROLOGICAL BOWEL NUTRITION STATUS      Incontinent Diet(heart healthy/carb modified)  AMBULATORY STATUS COMMUNICATION OF NEEDS Skin   Limited Assist Verbally                         Personal Care Assistance Level of Assistance  Bathing, Feeding, Dressing Bathing Assistance: Limited assistance Feeding assistance: Independent Dressing Assistance: Limited assistance     Functional Limitations Info  Sight, Hearing, Speech Sight Info: Adequate Hearing Info: Adequate Speech Info: Adequate    SPECIAL CARE FACTORS FREQUENCY  PT (By licensed PT)                    Contractures Contractures Info: Not present    Additional Factors Info  Code Status, Allergies Code Status Info: Partial Allergies Info: AMOXICILLIN, CIPROFLOXACIN, SULFONAMIDE DERIVATIVES, ROBAXIN METHOCARBAMOL, VERAPAMIL            Current Medications (12/21/2017):  This is the current hospital active medication list Current Facility-Administered Medications  Medication Dose Route Frequency Provider Last Rate Last Dose  . acetaminophen (TYLENOL) tablet 650 mg  650 mg Oral Q6H PRN Samuella Cota, MD       Or  . acetaminophen (TYLENOL) suppository 650  mg  650 mg Rectal Q6H PRN Samuella Cota, MD      . apixaban [facility]ne [place]) tablet 5 mg  5 mg Oral BID Samuella Cota, MD   5 mg at 12/21/17 2876  . fluconazole (DIFLUCAN) tablet 100 mg  100 mg Oral Daily Samuella Cota, MD   100 mg at 12/21/17 0926  . folic acid (FOLVITE) tablet 1 mg  1 mg Oral Daily Samuella Cota, MD   1 mg at 12/21/17 8115  .  furosemide (LASIX) injection 40 mg  40 mg Intravenous Daily Samuella Cota, MD   40 mg at 12/21/17 0932  . Gerhardt's butt cream   Topical TID Samuella Cota, MD      . levothyroxine (SYNTHROID, LEVOTHROID) tablet 150 mcg  150 mcg Oral QAC breakfast Samuella Cota, MD   150 mcg at 12/21/17 0545  . ondansetron (ZOFRAN) tablet 4 mg  4 mg Oral Q6H PRN Samuella Cota, MD       Or  . ondansetron [facility]) injection 4 mg  4 mg Intravenous Q6H PRN Samuella Cota, MD   4 mg at 12/14/17 7262  . pantoprazole (PROTONIX) EC tablet 40 mg  40 mg Oral Daily Samuella Cota, MD   40 mg at 12/21/17 0954  . phytonadione (VITAMIN K) tablet 5 mg  5 mg Oral Daily Samuella Cota, MD   5 mg at 12/21/17 0948  . propranolol (INDERAL) tablet 20 mg  20 mg Oral BID Samuella Cota, MD   20 mg at 12/21/17 0355  . saccharomyces boulardii (FLORASTOR) capsule 250 mg  250 mg Oral BID Samuella Cota, MD   250 mg at 12/21/17 9741  . spironolactone (ALDACTONE) tablet 25 mg  25 mg Oral Daily Samuella Cota, MD   25 mg at 12/21/17 0926  . vancomycin (VANCOCIN) 50 mg/mL oral solution 125 mg  125 mg Oral QID Samuella Cota, MD   125 mg at 12/21/17 6384     Discharge Medications: Please see discharge summary for a list of discharge medications.  Relevant Imaging Results:  Relevant Lab Results:   Additional Information SS# [id number]  Wende Neighbors, LCSW

## 2017-12-21 NOTE — Consult Note (Signed)
   [facility] CM Inpatient Consult   12/21/2017  MAURISA TESMER Feb 25, 1941 [id number]    [facility] hospital liaison follow up.  Chart review reveals discharge plan is now for SNF.  Will continue to follow along for which facility. Will update [facility] on disposition plans.   [name], MSN-Ed, RN,BSN [facility] Liaison [phone]

## 2017-12-21 NOTE — Progress Notes (Signed)
PROGRESS NOTE  Brittney Valentine IRS:[id number] DOB: Nov 23, 1940 DOA: 12/13/2017 PCP: Unk Pinto, MD  Brief Narrative: 77 year old woman PMH cirrhosis, atrial fibrillation, PE, DVT, presented with weakness, nausea, vomiting.  Admitted for suspected sepsis secondary to UTI, atrial fibrillation with rapid ventricular response  Discharged 6/30, treated for bilateral PE, left lower extremity DVT in the context of recent kidney stone removal, patient off Eliquis in anticipation of colonoscopy and EGD.  Assessment/Plan C diff colitis --Continues to improve.  6 more days of vancomycin oral.  Acute on chronic combined diastolic, systolic CHF with significant volume overload. --Responding to IV diuresis slowly.  Increase Lasix.  Sepsis secondary to Klebsiella, Enterococcus faecalis UTI --Resolved.  Completed antibiotics.  Strep viridans bacteremia  --Completed antibiotics per infectious disease.  Atrial fibrillation.  Rate controlled. --Will continue apixaban, propranolol.  Avoid amiodarone secondary to elevated LFTs, hypothyroidism.  Anemia acute on chronic; folate deficiency.  Fecal occult blood positive. --Hemoglobin has been stable, continue folate EGD without evidence of bleeding.  Report below. --Continue PPI  Acute kidney injury --Resolved  NASH cirrhosis, esophageal varices, ascites, splenomegaly, coagulopathy/elevated INR --per oncology: Continue Vit K 5mg  po daily 4 more days  Hypothyroidism, abnormal TSH, follow-up as an outpatient  Recent dx PE/DVT --Continue apixaban  . DVT prophylaxis: Apixaban Code Status: partial Family Communication: none Disposition Plan: Therapy is recommended skilled nursing facility.  Patient agreeable.  Anticipate discharge likely 48 hours.   [name], MD  [facility] Direct contact: [phone] --Via amion app OR  --[email or web address]; password [email or web address]  7PM-7AM contact night coverage as above 12/21/2017, 3:17 PM  LOS: 8 days     Consultants:  Cardiology  GI  Hematology   Procedures:  EGD Impression:               - Grade II esophageal varices.                           - Esophagogastric landmarks identified.                           - Portal hypertensive gastropathy.                           - Normal examined duodenum.                           - No specimens collected.  Antimicrobials:  Ceftriaxone  Vancomycin  Interval history/Subjective: Diarrhea better. Some nausea today.  Objective: Vitals:  Vitals:   12/21/17 0750 12/21/17 0923  BP: 110/60 (!) 107/53  Pulse:  88  Resp:    Temp: 97.6 F (36.4 C)   SpO2:      Exam: Constitutional:   . Appears calm and comfortable Respiratory:  . CTA bilaterally, no w/r/r . Respiratory effort normal Cardiovascular:  . RRR, no m/r/g . 3+ BLE edema, but better Abdomen:  . Soft ntnd Psychiatric:  . Mental status o Mood, affect appropriate  I have personally reviewed the following:   Labs:  Multiple voids.  7 stools recorded.  Potassium 3.9, BUN trending down 26, creatinine stable 1.16.  Scheduled Meds: . apixaban  5 mg Oral BID  . fluconazole  100 mg Oral Daily  . folic acid  1 mg Oral Daily  . furosemide  40 mg Intravenous Daily  . Gerhardt's butt cream   Topical  TID  . levothyroxine  150 mcg Oral QAC breakfast  . pantoprazole  40 mg Oral Daily  . phytonadione  5 mg Oral Daily  . propranolol  20 mg Oral BID  . saccharomyces boulardii  250 mg Oral BID  . spironolactone  25 mg Oral Daily  . vancomycin  125 mg Oral QID   Continuous Infusions:   Active Problems:   Hepatic cirrhosis (HCC)   Esophageal varices (HCC)   Atrial fibrillation with RVR (HCC)   Hypothyroidism   Bacterial cystitis   Chronic combined systolic and diastolic CHF (congestive heart failure) (HCC)   DVT (deep venous thrombosis) (HCC)   PE (pulmonary thromboembolism) ([place])   Bacteremia due to Streptococcus ([place])    Heme positive stool    Clostridium difficile colitis   Coagulopathy ([place])   LOS: 8 days

## 2017-12-21 NOTE — Progress Notes (Signed)
Physical Therapy Treatment Patient Details Name: Brittney Valentine MRN: [id number] DOB: 1941/01/20 Today's Date: 12/21/2017    History of Present Illness Brittney Valentine is a 77 y.o. female with medical history significant of liver cirrhosis, ascites, esophageal varicose, portal hypertension, diverticulosis, obesity, PAF not on anticoagulants, CHF with EF of 35%, kidney stone,  Presented with nausea and vomiting. Admitted for suspected sepsis secondary to UTI, atrial fibrillation with rapid ventricular response     PT Comments    Pt making steady progress with mobility. Continue to recommend ST-SNF prior to return home.   Follow Up Recommendations  SNF     Equipment Recommendations  (TBD at next venue)    Recommendations for Other Services       Precautions / Restrictions Restrictions Weight Bearing Restrictions: No    Mobility  Bed Mobility Overal bed mobility: Needs Assistance Bed Mobility: Supine to Sit     Supine to sit: Min assist     General bed mobility comments: Assist to elevate trunk into sitting and to bring legs off of bed  Transfers Overall transfer level: Needs assistance Equipment used: Rolling walker (2 wheeled) Transfers: Sit to/from [facility] Sit to Stand: Min assist Stand pivot transfers: Min assist       General transfer comment: Assist for balance. Verbal cues for hand placement with pt tending to pull up on walker.  Ambulation/Gait Ambulation/Gait assistance: Min assist Gait Distance (Feet): 30 Feet Assistive device: Rolling walker (2 wheeled) Gait Pattern/deviations: Step-through pattern;Decreased stride length;Drifts right/left Gait velocity: decr Gait velocity interpretation: 1.31 - 2.62 ft/sec, indicative of limited community ambulator General Gait Details: Assist for balance   Stairs             Wheelchair Mobility    Modified Rankin (Stroke Patients Only)       Balance Overall balance assessment:  Needs assistance Sitting-balance support: No upper extremity supported;Feet supported Sitting balance-Leahy Scale: Fair     Standing balance support: Bilateral upper extremity supported Standing balance-Leahy Scale: Poor Standing balance comment: walker and min guard for static standing                            Cognition Arousal/Alertness: Awake/alert Behavior During Therapy: WFL for tasks assessed/performed Overall Cognitive Status: Within Functional Limits for tasks assessed                                        Exercises      General Comments        Pertinent Vitals/Pain Pain Assessment: No/denies pain    Home Living                      Prior Function            PT Goals (current goals can now be found in the care plan section) Progress towards PT goals: Progressing toward goals    Frequency    Min 2X/week      PT Plan Current plan remains appropriate    Co-evaluation              AM-PAC PT "6 Clicks" Daily Activity  Outcome Measure  Difficulty turning over in bed (including adjusting bedclothes, sheets and blankets)?: A Little Difficulty moving from lying on back to sitting on the side of the bed? : Unable Difficulty sitting  down on and standing up from a chair with arms (e.g., wheelchair, bedside commode, etc,.)?: Unable Help needed moving to and from a bed to chair (including a wheelchair)?: A Little Help needed walking in hospital room?: A Little Help needed climbing 3-5 steps with a railing? : A Lot 6 Click Score: 13    End of Session Equipment Utilized During Treatment: Gait belt Activity Tolerance: Patient tolerated treatment well Patient left: with call bell/phone within reach;in chair;with chair alarm set Nurse Communication: Mobility status PT Visit Diagnosis: Unsteadiness on feet (R26.81);Other abnormalities of gait and mobility (R26.89);Muscle weakness (generalized) (M62.81);Difficulty in  walking, not elsewhere classified (R26.2);Dizziness and giddiness (R42)     Time: 5170-0174 PT Time Calculation (min) (ACUTE ONLY): 24 min  Charges:  $Gait Training: 23-37 mins                    G Codes:       [facility] PT [place] 12/21/2017, 4:54 PM

## 2017-12-22 DIAGNOSIS — I5043 Acute on chronic combined systolic (congestive) and diastolic (congestive) heart failure: Secondary | ICD-10-CM

## 2017-12-22 LAB — BASIC METABOLIC PANEL
ANION GAP: 8 (ref 5–15)
BUN: 27 mg/dL — ABNORMAL HIGH (ref 8–23)
CALCIUM: 8.6 mg/dL — AB (ref 8.9–10.3)
CHLORIDE: 111 mmol/L (ref 98–111)
CO2: 19 mmol/L — AB (ref 22–32)
Creatinine, Ser: 1.15 mg/dL — ABNORMAL HIGH (ref 0.44–1.00)
GFR calc non Af Amer: 46 mL/min — ABNORMAL LOW (ref >60)
GFR, EST AFRICAN AMERICAN: 53 mL/min — AB (ref >60)
GLUCOSE: 77 mg/dL (ref 70–99)
Potassium: 4.4 mmol/L (ref 3.5–5.1)
Sodium: 138 mmol/L (ref 135–145)

## 2017-12-22 NOTE — Progress Notes (Signed)
PROGRESS NOTE  Brittney Valentine YTW:[id number] DOB: 1940/07/08 DOA: 12/13/2017 PCP: Unk Pinto, MD  Brief Narrative: 77 year old woman PMH cirrhosis, atrial fibrillation, PE, DVT, presented with weakness, nausea, vomiting.  Admitted for suspected sepsis secondary to UTI, atrial fibrillation with rapid ventricular response.  She was subsequently found to have C. difficile colitis and continues on oral vancomycin for this.  Sepsis secondary to UTI and streptococcal viridans bacteremia was treated with antibiotics.  Atrial fibrillation was addressed by cardiology and patient has remained stable from the standpoint.  Main issue at this point is ongoing volume overload secondary to acute on chronic diastolic and systolic CHF which is slowly responding to diuresis.  Once volume overload has improved, anticipate transfer to skilled nursing facility for short-term rehab.  Assessment/Plan C diff colitis --Improving, only 2 stools recorded last 24 hours.  Continue oral vancomycin at least an additional 5 days or longer if necessary depending on clinical response.  Acute on chronic combined diastolic, systolic CHF with significant volume overload. --Still with significant volume overload but does appear to be responding to Lasix.  BMP stable.  Continue IV Lasix, check BMP in a.m.  Atrial fibrillation.  Rate controlled. --Continue apixaban, propranolol.  Avoid amiodarone secondary to elevated LFTs, hypothyroidism.  Sepsis secondary to Klebsiella, Enterococcus faecalis UTI --Resolved.  Completed antibiotics.  Strep viridans bacteremia  --Completed antibiotics per infectious disease.  Anemia acute on chronic; folate deficiency.  Fecal occult blood positive. --Hemoglobin stable, continue folate EGD without evidence of bleeding.  Report below. --Continue PPI  Acute kidney injury --Resolved  NASH cirrhosis, esophageal varices, ascites, splenomegaly, coagulopathy/elevated INR --per oncology:  Continue Vit K 5mg  po daily 3 more days  Hypothyroidism, abnormal TSH, follow-up as an outpatient  Recent dx PE/DVT --Continue apixaban  . DVT prophylaxis: Apixaban Code Status: partial Family Communication: none Disposition Plan: SNF   [name], MD  [facility] Direct contact: [phone] --Via amion app OR  --[email or web address]; password [email or web address]  7PM-7AM contact night coverage as above 12/22/2017, 2:55 PM  LOS: 9 days   Consultants:  Cardiology  GI  Hematology   Procedures:  EGD Impression:               - Grade II esophageal varices.                           - Esophagogastric landmarks identified.                           - Portal hypertensive gastropathy.                           - Normal examined duodenum.                           - No specimens collected.  Antimicrobials:  Ceftriaxone  Vancomycin  Interval history/Subjective: Some nausea but was able to eat.  Diarrhea improved.  Objective: Vitals:  Vitals:   12/22/17 0350 12/22/17 0946  BP: 108/64 99/63  Pulse: 98 95  Resp:    Temp: 97.6 F (36.4 C)   SpO2: 98%     Exam: Constitutional:   . Appears calm and comfortable Respiratory:  . CTA bilaterally, no w/r/r . Respiratory effort normal Cardiovascular:  . RRR, no m/r/g . Decreasing right lower extremity edema, 2+.  Left lower extremity edema remains 3+. Abdomen:  .  Soft, nontender Psychiatric:  . Mental status o Mood, affect appropriate  I have personally reviewed the following:   Labs:  Basic metabolic panel unremarkable.  BUN stable at 27.  Creatinine stable 1.15.   Scheduled Meds: . apixaban  5 mg Oral BID  . folic acid  1 mg Oral Daily  . furosemide  40 mg Intravenous BID  . Gerhardt's butt cream   Topical TID  . levothyroxine  150 mcg Oral QAC breakfast  . pantoprazole  40 mg Oral Daily  . phytonadione  5 mg Oral Daily  . propranolol  20 mg Oral BID  . saccharomyces boulardii  250 mg Oral BID  .  spironolactone  25 mg Oral Daily  . vancomycin  125 mg Oral QID   Continuous Infusions:   Principal Problem:   Acute on chronic combined systolic and diastolic CHF (congestive heart failure) (HCC) Active Problems:   Hepatic cirrhosis (HCC)   Esophageal varices (HCC)   Atrial fibrillation with RVR (HCC)   Hypothyroidism   Bacterial cystitis   Chronic combined systolic and diastolic CHF (congestive heart failure) (HCC)   DVT (deep venous thrombosis) (HCC)   PE (pulmonary thromboembolism) ([place])   Bacteremia due to Streptococcus ([place])    Clostridium difficile colitis   Coagulopathy (HCC)   LOS: 9 days

## 2017-12-22 NOTE — Progress Notes (Signed)
Clinical Social Worker following patient for support and discharge needs. At this time patient has chosen to go to [facility]. Insurance authorization has been attained whenever patient is medically cleared.   Rhea Pink, MSW,  [place]

## 2017-12-23 DIAGNOSIS — I5043 Acute on chronic combined systolic (congestive) and diastolic (congestive) heart failure: Secondary | ICD-10-CM

## 2017-12-23 NOTE — Progress Notes (Signed)
PROGRESS NOTE  Brittney Valentine GQQ:[id number] DOB: December 19, 1940 DOA: 12/13/2017 PCP: Unk Pinto, MD  Brief Narrative: 77 year old woman PMH cirrhosis, atrial fibrillation, PE, DVT, presented with weakness, nausea, vomiting.  Admitted for suspected sepsis secondary to UTI, atrial fibrillation with rapid ventricular response.  She was subsequently found to have C. difficile colitis and continues on oral vancomycin for this.  Sepsis secondary to UTI and streptococcal viridans bacteremia was treated with antibiotics.  Atrial fibrillation was addressed by cardiology and patient has remained stable from the standpoint.  Main issue at this point is ongoing volume overload secondary to acute on chronic diastolic and systolic CHF.    Assessment/Plan C diff colitis-- Continues to improve, currently on oral vancomycin, will continue these for about 4 more days if she continues to improve   Acute on chronic combined diastolic, systolic CHF with significant volume overload. --Diuresing,.  Weight is 227 pounds, query accuracy of scale, continue IV Lasix 40 mg every 12 hours  Atrial fibrillation.  Stable, continue propranolol for rate control, continue apixaban for anticoagulation, avoid amiodarone due to elevated LFTs and hypothyroidism   Sepsis secondary to Klebsiella, Enterococcus faecalis UTI --Resolved.  Completed antibiotics-be judicious with antibiotics as patient has history of C. difficile  Strep viridans bacteremia  --Completed antibiotics per infectious disease-be judicious with antibiotics as patient has history of C. difficile  Anemia acute on chronic; folate deficiency.  Fecal occult blood positive. --Hemoglobin stable, continue folate,  EGD without evidence of bleeding.  --Continue PPI  Acute kidney injury --Resolved  NASH cirrhosis, esophageal varices, ascites, splenomegaly, coagulopathy/elevated INR --per oncology: Continue Vit K 5mg  po daily 2 more days  Hypothyroidism, abnormal  TSH, follow-up as an outpatient  Recent dx PE/DVT --Continue apixaban  . DVT prophylaxis: Apixaban Code Status: partial Disposition Plan: SNF   Roxan Hockey, MD   [facility] --Via [employer] app OR  --[email or web address]; password [email or web address]  7PM-7AM contact night coverage as above 12/23/2017, 6:13 PM  LOS: 10 days   Consultants:  Cardiology  GI  Hematology   Procedures:  EGD Impression:               - Grade II esophageal varices.                           - Esophagogastric landmarks identified.                           - Portal hypertensive gastropathy.                           - Normal examined duodenum.                           - No specimens collected.  Antimicrobials:  Ceftriaxone  Vancomycin  Subjective: Oral intake is better,.  No fever no chills, no nausea no vomiting, diarrhea is better  Objective: Vitals:  Vitals:   12/23/17 0529 12/23/17 1115  BP: (!) 104/47 109/63  Pulse: 98   Resp:    Temp: 98 F (36.7 C)   SpO2: 95%     Exam: Constitutional:   . Appears calm and comfortable Respiratory:  . CTA bilaterally, no w/r/r . Respiratory effort normal Cardiovascular:  . RRR, no m/r/g . Decreasing right lower extremity edema, 2+.  Left lower extremity edema remains 3+. Abdomen:  . Soft, nontender  Psychiatric:  . Mental status o Mood, affect appropriate  Scheduled Meds: . apixaban  5 mg Oral BID  . folic acid  1 mg Oral Daily  . furosemide  40 mg Intravenous BID  . Gerhardt's butt cream   Topical TID  . levothyroxine  150 mcg Oral QAC breakfast  . pantoprazole  40 mg Oral Daily  . phytonadione  5 mg Oral Daily  . propranolol  20 mg Oral BID  . saccharomyces boulardii  250 mg Oral BID  . spironolactone  25 mg Oral Daily  . vancomycin  125 mg Oral QID   Continuous Infusions:   Wt Readings from Last 3 Encounters:  12/23/17 103.2 kg (227 lb 9.6 oz)  12/05/17 99.9 kg (220 lb 3.8 oz)  12/03/17 96.6 kg (213 lb)     Principal  Problem:   Acute on chronic combined systolic and diastolic CHF (congestive heart failure) (HCC) Active Problems:   Hepatic cirrhosis (HCC)   Esophageal varices (HCC)   Atrial fibrillation with RVR (HCC)   Hypothyroidism   Bacterial cystitis   Chronic combined systolic and diastolic CHF (congestive heart failure) (HCC)   DVT (deep venous thrombosis) (HCC)   PE (pulmonary thromboembolism) ([place])   Bacteremia due to Streptococcus ([place])    Clostridium difficile colitis   Coagulopathy (HCC)   LOS: 10 days

## 2017-12-23 NOTE — Progress Notes (Addendum)
Physical Therapy Treatment Patient Details Name: Brittney Valentine MRN: [id number] DOB: 07/12/1940 Today's Date: 12/23/2017    History of Present Illness Brittney Valentine is a 77 y.o. female with medical history significant of liver cirrhosis, ascites, esophageal varicose, portal hypertension, diverticulosis, obesity, PAF not on anticoagulants, CHF with EF of 35%, kidney stone,  Presented with nausea and vomiting. Admitted for suspected sepsis secondary to UTI, atrial fibrillation with rapid ventricular response     PT Comments    Pt progressing well overall. Orthostatic vitals noted this date after noted dizziness sitting at EOB. Pt able to tolerate seated exercises with gradual improvement in seated BP. Pt performing STS transfers with RW and slight elevated surface 18x in session including 3x5 for exercise. Pt AMB 3x59ft with RW, CGA to supervision only as no noted balance deficits this date. Pt left up in chair, encouraged to be OOB 2 hours daily, and progress toiletting away from bed pan and toward BSC or eventual AMB to BR.     12/23/17 1108  Therapy Vitals  Patient Position (if appropriate) Orthostatic Vitals  Orthostatic Sitting  BP- Sitting 119/71  Orthostatic Standing at 0 minutes  BP- Standing at 0 minutes 100/67  Orthostatic Standing at 3 minutes  BP- Standing at 3 minutes 96/51     Follow Up Recommendations  SNF     Equipment Recommendations       Recommendations for Other Services OT consult     Precautions / Restrictions Precautions Precautions: Fall Restrictions Weight Bearing Restrictions: No    Mobility  Bed Mobility Overal bed mobility: Needs Assistance Bed Mobility: Supine to Sit     Supine to sit: Min assist        Transfers   Equipment used: Rolling walker (2 wheeled) Transfers: Sit to/from Stand Sit to Stand: From elevated surface;Supervision(17x in session)            Ambulation/Gait Ambulation/Gait assistance: Min guard Gait  Distance (Feet): 30 Feet(3x3ft, rest between (limited d/t orthostatic BP) ) Assistive device: Rolling walker (2 wheeled) Gait Pattern/deviations: Step-through pattern;Decreased stride length;Drifts right/left Gait velocity: 0.58m/s  Gait velocity interpretation: <1.31 ft/sec, indicative of household Metallurgist Rankin (Stroke Patients Only)       Balance Overall balance assessment: No apparent balance deficits (not formally assessed)         Standing balance support: During functional activity Standing balance-Leahy Scale: Fair Standing balance comment: walker and min guard for AMB                            Cognition Arousal/Alertness: Awake/alert Behavior During Therapy: WFL for tasks assessed/performed Overall Cognitive Status: Within Functional Limits for tasks assessed                                        Exercises Other Exercises Other Exercises: STS at EOB with RW (elevated surface) 3x5 Other Exercises: Seated Marching, alterant: 3x5    General Comments        Pertinent Vitals/Pain Pain Assessment: No/denies pain    Home Living                      Prior Function  PT Goals (current goals can now be found in the care plan section) Acute Rehab PT Goals Patient Stated Goal: to feel better PT Goal Formulation: With patient Time For Goal Achievement: 01/02/18 Potential to Achieve Goals: Good Progress towards PT goals: Progressing toward goals    Frequency    Min 2X/week      PT Plan Current plan remains appropriate    Co-evaluation              AM-PAC PT "6 Clicks" Daily Activity  Outcome Measure  Difficulty turning over in bed (including adjusting bedclothes, sheets and blankets)?: A Little Difficulty moving from lying on back to sitting on the side of the bed? : A Lot Difficulty sitting down on and standing up from a chair  with arms (e.g., wheelchair, bedside commode, etc,.)?: A Lot Help needed moving to and from a bed to chair (including a wheelchair)?: A Little Help needed walking in hospital room?: A Little Help needed climbing 3-5 steps with a railing? : Total 6 Click Score: 14    End of Session Equipment Utilized During Treatment: Gait belt Activity Tolerance: Patient tolerated treatment well;Treatment limited secondary to medical complications (Comment)(orthostatic vitals ) Patient left: with call bell/phone within reach;in chair;with chair alarm set Nurse Communication: Mobility status PT Visit Diagnosis: Unsteadiness on feet (R26.81);Other abnormalities of gait and mobility (R26.89);Muscle weakness (generalized) (M62.81);Difficulty in walking, not elsewhere classified (R26.2);Dizziness and giddiness (R42)     Time: 7847-8412 PT Time Calculation (min) (ACUTE ONLY): 31 min  Charges:  $Therapeutic Exercise: 8-22 mins $Therapeutic Activity: 8-22 mins                    G Codes:       11:32 AM, 01-11-18 Etta Grandchild, PT, DPT Physical Therapist - [place] [phone] (Pager)  [phone] (Office)      Rayden Scheper C 2018/01/11, 11:30 AM

## 2017-12-24 ENCOUNTER — Other Ambulatory Visit: Payer: Self-pay | Admitting: *Deleted

## 2017-12-24 DIAGNOSIS — I4891 Unspecified atrial fibrillation: Secondary | ICD-10-CM | POA: Diagnosis not present

## 2017-12-24 DIAGNOSIS — K219 Gastro-esophageal reflux disease without esophagitis: Secondary | ICD-10-CM | POA: Diagnosis not present

## 2017-12-24 DIAGNOSIS — R2681 Unsteadiness on feet: Secondary | ICD-10-CM | POA: Diagnosis not present

## 2017-12-24 DIAGNOSIS — K920 Hematemesis: Secondary | ICD-10-CM | POA: Diagnosis not present

## 2017-12-24 DIAGNOSIS — Z8249 Family history of ischemic heart disease and other diseases of the circulatory system: Secondary | ICD-10-CM | POA: Diagnosis not present

## 2017-12-24 DIAGNOSIS — I272 Pulmonary hypertension, unspecified: Secondary | ICD-10-CM | POA: Diagnosis not present

## 2017-12-24 DIAGNOSIS — I10 Essential (primary) hypertension: Secondary | ICD-10-CM | POA: Diagnosis not present

## 2017-12-24 DIAGNOSIS — K767 Hepatorenal syndrome: Secondary | ICD-10-CM | POA: Diagnosis not present

## 2017-12-24 DIAGNOSIS — Z6841 Body Mass Index (BMI) 40.0 and over, adult: Secondary | ICD-10-CM | POA: Diagnosis not present

## 2017-12-24 DIAGNOSIS — R188 Other ascites: Secondary | ICD-10-CM | POA: Diagnosis not present

## 2017-12-24 DIAGNOSIS — I959 Hypotension, unspecified: Secondary | ICD-10-CM | POA: Diagnosis not present

## 2017-12-24 DIAGNOSIS — I480 Paroxysmal atrial fibrillation: Secondary | ICD-10-CM | POA: Diagnosis not present

## 2017-12-24 DIAGNOSIS — E039 Hypothyroidism, unspecified: Secondary | ICD-10-CM | POA: Diagnosis not present

## 2017-12-24 DIAGNOSIS — R000 Tachycardia, unspecified: Secondary | ICD-10-CM | POA: Diagnosis not present

## 2017-12-24 DIAGNOSIS — M6281 Muscle weakness (generalized): Secondary | ICD-10-CM | POA: Diagnosis not present

## 2017-12-24 DIAGNOSIS — B029 Zoster without complications: Secondary | ICD-10-CM | POA: Diagnosis not present

## 2017-12-24 DIAGNOSIS — R278 Other lack of coordination: Secondary | ICD-10-CM | POA: Diagnosis not present

## 2017-12-24 DIAGNOSIS — K2210 Ulcer of esophagus without bleeding: Secondary | ICD-10-CM | POA: Diagnosis not present

## 2017-12-24 DIAGNOSIS — E875 Hyperkalemia: Secondary | ICD-10-CM | POA: Diagnosis not present

## 2017-12-24 DIAGNOSIS — Z888 Allergy status to other drugs, medicaments and biological substances status: Secondary | ICD-10-CM | POA: Diagnosis not present

## 2017-12-24 DIAGNOSIS — K766 Portal hypertension: Secondary | ICD-10-CM | POA: Diagnosis not present

## 2017-12-24 DIAGNOSIS — I2699 Other pulmonary embolism without acute cor pulmonale: Secondary | ICD-10-CM | POA: Diagnosis not present

## 2017-12-24 DIAGNOSIS — K922 Gastrointestinal hemorrhage, unspecified: Secondary | ICD-10-CM | POA: Diagnosis not present

## 2017-12-24 DIAGNOSIS — R109 Unspecified abdominal pain: Secondary | ICD-10-CM | POA: Diagnosis not present

## 2017-12-24 DIAGNOSIS — B955 Unspecified streptococcus as the cause of diseases classified elsewhere: Secondary | ICD-10-CM | POA: Diagnosis not present

## 2017-12-24 DIAGNOSIS — Z87440 Personal history of urinary (tract) infections: Secondary | ICD-10-CM | POA: Diagnosis not present

## 2017-12-24 DIAGNOSIS — I8501 Esophageal varices with bleeding: Secondary | ICD-10-CM | POA: Diagnosis not present

## 2017-12-24 DIAGNOSIS — I447 Left bundle-branch block, unspecified: Secondary | ICD-10-CM | POA: Diagnosis not present

## 2017-12-24 DIAGNOSIS — I5033 Acute on chronic diastolic (congestive) heart failure: Secondary | ICD-10-CM | POA: Diagnosis not present

## 2017-12-24 DIAGNOSIS — Z79899 Other long term (current) drug therapy: Secondary | ICD-10-CM | POA: Diagnosis not present

## 2017-12-24 DIAGNOSIS — R140 Abdominal distension (gaseous): Secondary | ICD-10-CM | POA: Diagnosis not present

## 2017-12-24 DIAGNOSIS — I5043 Acute on chronic combined systolic (congestive) and diastolic (congestive) heart failure: Secondary | ICD-10-CM | POA: Diagnosis not present

## 2017-12-24 DIAGNOSIS — Z66 Do not resuscitate: Secondary | ICD-10-CM | POA: Diagnosis not present

## 2017-12-24 DIAGNOSIS — K3189 Other diseases of stomach and duodenum: Secondary | ICD-10-CM | POA: Diagnosis not present

## 2017-12-24 DIAGNOSIS — R279 Unspecified lack of coordination: Secondary | ICD-10-CM | POA: Diagnosis not present

## 2017-12-24 DIAGNOSIS — D62 Acute posthemorrhagic anemia: Secondary | ICD-10-CM | POA: Diagnosis not present

## 2017-12-24 DIAGNOSIS — I481 Persistent atrial fibrillation: Secondary | ICD-10-CM | POA: Diagnosis not present

## 2017-12-24 DIAGNOSIS — K7031 Alcoholic cirrhosis of liver with ascites: Secondary | ICD-10-CM | POA: Diagnosis not present

## 2017-12-24 DIAGNOSIS — E872 Acidosis: Secondary | ICD-10-CM | POA: Diagnosis not present

## 2017-12-24 DIAGNOSIS — N179 Acute kidney failure, unspecified: Secondary | ICD-10-CM | POA: Diagnosis not present

## 2017-12-24 DIAGNOSIS — N3080 Other cystitis without hematuria: Secondary | ICD-10-CM | POA: Diagnosis not present

## 2017-12-24 DIAGNOSIS — N202 Calculus of kidney with calculus of ureter: Secondary | ICD-10-CM | POA: Diagnosis not present

## 2017-12-24 DIAGNOSIS — Z743 Need for continuous supervision: Secondary | ICD-10-CM | POA: Diagnosis not present

## 2017-12-24 DIAGNOSIS — Z9889 Other specified postprocedural states: Secondary | ICD-10-CM | POA: Diagnosis not present

## 2017-12-24 DIAGNOSIS — R9431 Abnormal electrocardiogram [ECG] [EKG]: Secondary | ICD-10-CM | POA: Diagnosis not present

## 2017-12-24 DIAGNOSIS — R41841 Cognitive communication deficit: Secondary | ICD-10-CM | POA: Diagnosis not present

## 2017-12-24 DIAGNOSIS — Z86711 Personal history of pulmonary embolism: Secondary | ICD-10-CM | POA: Diagnosis not present

## 2017-12-24 DIAGNOSIS — R079 Chest pain, unspecified: Secondary | ICD-10-CM | POA: Diagnosis not present

## 2017-12-24 DIAGNOSIS — R710 Precipitous drop in hematocrit: Secondary | ICD-10-CM | POA: Diagnosis not present

## 2017-12-24 DIAGNOSIS — R0602 Shortness of breath: Secondary | ICD-10-CM | POA: Diagnosis not present

## 2017-12-24 DIAGNOSIS — A414 Sepsis due to anaerobes: Secondary | ICD-10-CM | POA: Diagnosis not present

## 2017-12-24 DIAGNOSIS — M6389 Disorders of muscle in diseases classified elsewhere, multiple sites: Secondary | ICD-10-CM | POA: Diagnosis not present

## 2017-12-24 DIAGNOSIS — K7581 Nonalcoholic steatohepatitis (NASH): Secondary | ICD-10-CM | POA: Diagnosis not present

## 2017-12-24 DIAGNOSIS — A419 Sepsis, unspecified organism: Secondary | ICD-10-CM | POA: Diagnosis not present

## 2017-12-24 DIAGNOSIS — D689 Coagulation defect, unspecified: Secondary | ICD-10-CM | POA: Diagnosis not present

## 2017-12-24 DIAGNOSIS — I8500 Esophageal varices without bleeding: Secondary | ICD-10-CM | POA: Diagnosis not present

## 2017-12-24 DIAGNOSIS — I82402 Acute embolism and thrombosis of unspecified deep veins of left lower extremity: Secondary | ICD-10-CM | POA: Diagnosis not present

## 2017-12-24 DIAGNOSIS — Z9049 Acquired absence of other specified parts of digestive tract: Secondary | ICD-10-CM | POA: Diagnosis not present

## 2017-12-24 DIAGNOSIS — I509 Heart failure, unspecified: Secondary | ICD-10-CM | POA: Diagnosis not present

## 2017-12-24 DIAGNOSIS — Z880 Allergy status to penicillin: Secondary | ICD-10-CM | POA: Diagnosis not present

## 2017-12-24 DIAGNOSIS — Z515 Encounter for palliative care: Secondary | ICD-10-CM | POA: Diagnosis not present

## 2017-12-24 DIAGNOSIS — Z6838 Body mass index (BMI) 38.0-38.9, adult: Secondary | ICD-10-CM | POA: Diagnosis not present

## 2017-12-24 DIAGNOSIS — Z7901 Long term (current) use of anticoagulants: Secondary | ICD-10-CM | POA: Diagnosis not present

## 2017-12-24 DIAGNOSIS — R7881 Bacteremia: Secondary | ICD-10-CM | POA: Diagnosis not present

## 2017-12-24 DIAGNOSIS — N390 Urinary tract infection, site not specified: Secondary | ICD-10-CM | POA: Diagnosis not present

## 2017-12-24 DIAGNOSIS — I110 Hypertensive heart disease with heart failure: Secondary | ICD-10-CM | POA: Diagnosis not present

## 2017-12-24 DIAGNOSIS — K7469 Other cirrhosis of liver: Secondary | ICD-10-CM | POA: Diagnosis not present

## 2017-12-24 DIAGNOSIS — I82409 Acute embolism and thrombosis of unspecified deep veins of unspecified lower extremity: Secondary | ICD-10-CM | POA: Diagnosis not present

## 2017-12-24 DIAGNOSIS — I951 Orthostatic hypotension: Secondary | ICD-10-CM | POA: Diagnosis not present

## 2017-12-24 DIAGNOSIS — Z881 Allergy status to other antibiotic agents status: Secondary | ICD-10-CM | POA: Diagnosis not present

## 2017-12-24 DIAGNOSIS — K7460 Unspecified cirrhosis of liver: Secondary | ICD-10-CM | POA: Diagnosis not present

## 2017-12-24 DIAGNOSIS — E669 Obesity, unspecified: Secondary | ICD-10-CM | POA: Diagnosis not present

## 2017-12-24 DIAGNOSIS — R1013 Epigastric pain: Secondary | ICD-10-CM | POA: Diagnosis not present

## 2017-12-24 DIAGNOSIS — R600 Localized edema: Secondary | ICD-10-CM | POA: Diagnosis not present

## 2017-12-24 DIAGNOSIS — J9601 Acute respiratory failure with hypoxia: Secondary | ICD-10-CM | POA: Diagnosis not present

## 2017-12-24 DIAGNOSIS — R400 Somnolence: Secondary | ICD-10-CM | POA: Diagnosis not present

## 2017-12-24 DIAGNOSIS — I9589 Other hypotension: Secondary | ICD-10-CM | POA: Diagnosis not present

## 2017-12-24 DIAGNOSIS — A0472 Enterocolitis due to Clostridium difficile, not specified as recurrent: Secondary | ICD-10-CM | POA: Diagnosis not present

## 2017-12-24 DIAGNOSIS — D649 Anemia, unspecified: Secondary | ICD-10-CM | POA: Diagnosis not present

## 2017-12-24 DIAGNOSIS — Z882 Allergy status to sulfonamides status: Secondary | ICD-10-CM | POA: Diagnosis not present

## 2017-12-24 DIAGNOSIS — Z8619 Personal history of other infectious and parasitic diseases: Secondary | ICD-10-CM | POA: Diagnosis not present

## 2017-12-24 DIAGNOSIS — Z86718 Personal history of other venous thrombosis and embolism: Secondary | ICD-10-CM | POA: Diagnosis not present

## 2017-12-24 DIAGNOSIS — E8809 Other disorders of plasma-protein metabolism, not elsewhere classified: Secondary | ICD-10-CM | POA: Diagnosis not present

## 2017-12-24 DIAGNOSIS — K921 Melena: Secondary | ICD-10-CM | POA: Diagnosis not present

## 2017-12-24 LAB — CBC
HCT: 33.6 % — ABNORMAL LOW (ref 36.0–46.0)
Hemoglobin: 11.3 g/dL — ABNORMAL LOW (ref 12.0–15.0)
MCH: 30.7 pg (ref 26.0–34.0)
MCHC: 33.6 g/dL (ref 30.0–36.0)
MCV: 91.3 fL (ref 78.0–100.0)
PLATELETS: 301 10*3/uL (ref 150–400)
RBC: 3.68 MIL/uL — ABNORMAL LOW (ref 3.87–5.11)
RDW: 23.5 % — ABNORMAL HIGH (ref 11.5–15.5)
WBC: 7.3 10*3/uL (ref 4.0–10.5)

## 2017-12-24 LAB — BASIC METABOLIC PANEL
ANION GAP: 7 (ref 5–15)
BUN: 28 mg/dL — AB (ref 8–23)
CO2: 23 mmol/L (ref 22–32)
CREATININE: 1.28 mg/dL — AB (ref 0.44–1.00)
Calcium: 8.9 mg/dL (ref 8.9–10.3)
Chloride: 108 mmol/L (ref 98–111)
GFR calc Af Amer: 47 mL/min — ABNORMAL LOW (ref >60)
GFR, EST NON AFRICAN AMERICAN: 40 mL/min — AB (ref >60)
GLUCOSE: 135 mg/dL — AB (ref 70–99)
Potassium: 3.6 mmol/L (ref 3.5–5.1)
Sodium: 138 mmol/L (ref 135–145)

## 2017-12-24 MED ORDER — GERHARDT'S BUTT CREAM: 1.0000 "application " | TOPICAL_CREAM | Freq: Three times a day (TID) | CUTANEOUS | 0 refills | Status: DC

## 2017-12-24 MED ORDER — VANCOMYCIN 50 MG/ML ORAL SOLUTION: 125.0000 mg | Freq: Four times a day (QID) | ORAL | 0 refills | Status: AC

## 2017-12-24 MED ORDER — PROPRANOLOL HCL 20 MG PO TABS: 20.0000 mg | ORAL_TABLET | Freq: Two times a day (BID) | ORAL | 5 refills | Status: AC

## 2017-12-24 MED ORDER — LEVOTHYROXINE SODIUM 150 MCG PO TABS: 150.0000 ug | ORAL_TABLET | Freq: Every day | ORAL | 11 refills | Status: DC

## 2017-12-24 MED ORDER — ONDANSETRON HCL 4 MG PO TABS: 4.0000 mg | ORAL_TABLET | Freq: Four times a day (QID) | ORAL | 0 refills | Status: DC | PRN

## 2017-12-24 MED ORDER — METOLAZONE 5 MG PO TABS
2.5000 mg | ORAL_TABLET | Freq: Once | ORAL | Status: AC
  Administered 2017-12-24: 2.5 mg via ORAL
  Filled 2017-12-24: qty 1

## 2017-12-24 MED ORDER — SACCHAROMYCES BOULARDII 250 MG PO CAPS: 250.0000 mg | ORAL_CAPSULE | Freq: Two times a day (BID) | ORAL | 3 refills | Status: DC

## 2017-12-24 MED ORDER — FUROSEMIDE 40 MG PO TABS: 40.0000 mg | ORAL_TABLET | Freq: Two times a day (BID) | ORAL | 2 refills | Status: DC

## 2017-12-24 MED ORDER — SPIRONOLACTONE 25 MG PO TABS: 25.0000 mg | ORAL_TABLET | Freq: Two times a day (BID) | ORAL | 2 refills | Status: DC

## 2017-12-24 MED ORDER — APIXABAN 5 MG PO TABS: 5.0000 mg | ORAL_TABLET | Freq: Two times a day (BID) | ORAL | 2 refills | Status: DC

## 2017-12-24 MED ORDER — FOLIC ACID 1 MG PO TABS: 1.0000 mg | ORAL_TABLET | Freq: Every day | ORAL | 2 refills | Status: DC

## 2017-12-24 NOTE — Progress Notes (Signed)
Order received to discharge patient.  PIV access x2 removed.  Report called to receiving RN at [facility].

## 2017-12-24 NOTE — Patient Outreach (Signed)
[facility]) Care Management  12/24/2017  [name] Dec 01, 1940 [id number]   CSW received [facility] Consult for follow up at [facility] CSW noted plans for SNF transfer today to [facility]. This CSW will plan outreach call/visit in the next 7-10 business days.   Eduard Clos, MSW, [place] Worker  [place] [phone]

## 2017-12-24 NOTE — Discharge Instructions (Signed)
1) you are taking a blood thinner called apixaban/Eliquis so Avoid ibuprofen/Advil/Aleve/Motrin/Goody Powders/Naproxen/BC powders/Meloxicam/Diclofenac/Indomethacin and other Nonsteroidal anti-inflammatory medications as these will make you more likely to bleed and can cause stomach ulcers, can also cause Kidney problems.  2)Please Place knee-high compression stockings every morning take them off at bedtime 3) please elevate both legs and use Ace wraps for graduated pressure of lower extremities to help with chronic edema/swelling 4))Very low-salt diet advised 5)Weigh yourself daily, call if you gain more than 3 pounds in 1 day or more than 5 pounds in 1 week as your diuretic medications may need to be adjusted 6)Limit your Fluid  intake to no more than 60 ounces (1.8 Liters) per day 7) repeat BMP and CBC blood test within 1 week of discharge, no later than 12/30/2017

## 2017-12-24 NOTE — Clinical Social Work Placement (Addendum)
   CLINICAL SOCIAL WORK PLACEMENT  NOTE 12/24/17 - DISCHARGED TO [facility]BULANCE  Date:  12/24/2017  Patient Details  Name: Brittney Valentine MRN: [id number] Date of Birth: 12/07/1940  Clinical Social Work is seeking post-discharge placement for this patient at the [place] level of care (*CSW will initial, date and re-position this form in  chart as items are completed):  Yes   Patient/family provided with [place] Work Department's list of facilities offering this level of care within the geographic area requested by the patient (or if unable, by the patient's family).  Yes   Patient/family informed of their freedom to choose among providers that offer the needed level of care, that participate in Medicare, Medicaid or managed care program needed by the patient, have an available bed and are willing to accept the patient.  Yes   Patient/family informed of [facility]'s ownership interest in [facility] and [facility], as well as of the fact that they are under no obligation to receive care at these facilities.  PASRR submitted to EDS on 12/21/17     PASRR number received on 12/21/17     Existing PASRR number confirmed on       FL2 transmitted to all facilities in geographic area requested by pt/family on 12/21/17     FL2 transmitted to all facilities within larger geographic area on       Patient informed that his/her managed care company has contracts with or will negotiate with certain facilities, including the following:        Yes   Patient/family informed of bed offers received.  Patient chooses bed at [facility] [place]     Physician recommends and patient chooses bed at      Patient to be transferred to [facility] [place] on 12/24/17.  Patient to be transferred to facility by Ambulance     Patient family notified on 12/24/17 of transfer.  Name of family member notified:  Anson [place], nephew by  phone - [phone]     PHYSICIAN       Additional Comment:    _______________________________________________ Sable Feil, LCSW 12/24/2017, 12:17 PM

## 2017-12-24 NOTE — Care Management Important Message (Signed)
Important Message  Patient Details  Name: Brittney Valentine MRN: [id number] Date of Birth: [birth year]   Medicare Important Message Given:  Yes    Bethena Roys, RN 12/24/2017, 11:42 AM

## 2017-12-24 NOTE — Discharge Summary (Addendum)
Brittney Valentine, is a 77 y.o. female  DOB 1941-05-07  MRN [id number].  Admission date:  12/13/2017  Admitting Physician  Rise Patience, MD  Discharge Date:  12/24/2017   Primary MD  Unk Pinto, MD  Recommendations for primary care physician for things to follow:   1) you are taking a blood thinner called apixaban/Eliquis so Avoid ibuprofen/Advil/Aleve/Motrin/Goody Powders/Naproxen/BC powders/Meloxicam/Diclofenac/Indomethacin and other Nonsteroidal anti-inflammatory medications as these will make you more likely to bleed and can cause stomach ulcers, can also cause Kidney problems.  2)Please Place knee-high compression stockings every morning take them off at bedtime 3) please elevate both legs and use Ace wraps for graduated pressure of lower extremities to help with chronic edema/swelling 4))Very low-salt diet advised 5)Weigh yourself daily, call if you gain more than 3 pounds in 1 day or more than 5 pounds in 1 week as your diuretic medications may need to be adjusted 6)Limit your Fluid  intake to no more than 60 ounces (1.8 Liters) per day 7) repeat BMP and CBC blood test within 1 week of discharge, no later than 12/30/2017   Admission Diagnosis  Urinary tract infection without hematuria, site unspecified [N39.0] Hypotension due to hypovolemia [I95.89, E86.1] Sepsis ([place]) [A41.9]   Discharge Diagnosis  Urinary tract infection without hematuria, site unspecified [N39.0] Hypotension due to hypovolemia [I95.89, E86.1] Sepsis ([place]) [A41.9]    Principal Problem:   Acute on chronic combined systolic and diastolic CHF (congestive heart failure) ([place]) Active Problems:   Hepatic cirrhosis (HCC)   Esophageal varices (HCC)   Atrial fibrillation with RVR (HCC)   Hypothyroidism   Bacterial cystitis   Chronic combined systolic and diastolic CHF (congestive heart failure) (HCC)   DVT (deep venous  thrombosis) (HCC)   PE (pulmonary thromboembolism) ([place])   Bacteremia due to Streptococcus ([place])    Clostridium difficile colitis   Coagulopathy ([place])      Past Medical History:  Diagnosis Date  . Acute UTI   . Anemia   . Blood transfusion without reported diagnosis   . Cirrhosis ([place])   . Degenerative disk disease    knees  . Diverticulosis   . Dysrhythmia   . GERD (gastroesophageal reflux disease)   . Hemorrhoids   . History of kidney stones   . Hypertension   . Hypothyroidism   . Obesity   . Paroxysmal atrial fibrillation (HCC)   . Partial small bowel obstruction ([place])   . Personal history of colonic polyps 03/25/2012   8 mm rectal adenoma 03/2012  . Portal hypertensive gastropathy ([place])   . Shingles   . Thyroid disease   . UTI (urinary tract infection) 12/13/2017  . Varices, esophageal ([place])   . Zoster     Past Surgical History:  Procedure Laterality Date  . ABDOMINAL HYSTERECTOMY    . APPENDECTOMY  1991  . CHOLECYSTECTOMY  2000  . COLON SURGERY    . COLONOSCOPY    . CYSTOSCOPY     10-26-17 budyzn  . CYSTOSCOPY/URETEROSCOPY/HOLMIUM LASER/STENT PLACEMENT Left 10/26/2017  Procedure: CYSTOSCOPY, URETEROSCOPY/RETROGRADE/STENT PLACEMENT;  Surgeon: Nickie Retort, MD;  Location: [facility];  Service: Urology;  Laterality: Left;  NEEDS DIGITAL URETEROSCOPE  . CYSTOSCOPY/URETEROSCOPY/HOLMIUM LASER/STENT PLACEMENT Left 11/12/2017   Procedure: CYSTOSCOPY LEFT RETROGRADE PYELOGRAM LEFT URETEROSCOPY HOLMIUM LASER AND LEFT URETERAL STENT EXCHANGE AND LASER ENDO URETEROTOMY;  Surgeon: Ardis Hughs, MD;  Location: [facility];  Service: Urology;  Laterality: Left;  . ESOPHAGOGASTRODUODENOSCOPY  multiple  . ESOPHAGOGASTRODUODENOSCOPY N/A 12/16/2017   Procedure: ESOPHAGOGASTRODUODENOSCOPY (EGD);  Surgeon: Mauri Pole, MD;  Location: [facility] ENDOSCOPY;  Service: Endoscopy;  Laterality: N/A;       HPI  from the history and physical done on the day of admission:     Chief Complaint: Weakness.  HPI: Brittney Valentine is a 77 y.o. female with history of cirrhosis secondary to [place], A. fib, recently diagnosed pulmonary embolism and DVT of the left lower extremity, hypothyroidism, she has systolic CHF improved during last 2D echo done a week ago presents to the ER with complaints of having feeling weak since discharge last week.  Patient also has benign persistent nausea vomiting over the last 24 hours unable to keep in anything.  Denies any abdominal pain diarrhea chest pain or shortness of breath.  Patient has had recent left ureteral stent placed which was removed last month.  ED Course: In the ER patient is found to be hypotensive with elevated lactate and WBC count.  Patient was in A. fib with RVR.  Patient was given 3 L normal saline bolus for possible sepsis following which blood pressure improved.  Patient is still tachycardic at the time of my exam.  Abdomen appears benign on exam.  Still feeling nauseated but able to drink clear liquids.  UA is consistent with UTI and patient is placed on ceftriaxone for sepsis secondary to UTI.    Hospital Course:     Brief Summary: 77 year old woman PMH cirrhosis, atrial fibrillation, PE, DVT, presented with weakness, nausea, vomiting.  Admitted for suspected sepsis secondary to UTI, atrial fibrillation with rapid ventricular response.  She was subsequently found to have C. difficile colitis and continues on oral vancomycin for this.  Sepsis secondary to UTI and streptococcal viridans bacteremia was treated with antibiotics.  Atrial fibrillation was addressed by cardiology and patient has remained stable from the standpoint.    Assessment/Plan  C diff colitis-- Overall much improved, currently on oral vancomycin, will discharge to skilled nursing facility on oral vancomycin for additional  4 days,  frequency and volume of stools have improved significantly (oNLY 2 mushy bowel movements in the last 18 hours or  so)   Acute on chronic combined diastolic, systolic CHF with significant volume overload. --Diuresing, clinically much improved, no orthopnea, no paroxysmal nocturnal dyspnea, able to transfer from chair to bed in bed to chair without hypoxia or significant dyspnea on exertion, however lower extremity edema persist.  Weight is 228 pounds, query accuracy of scale, treated with IV Lasix 40 mg every 12 hours, will  discharge on Lasix 40 mg p.o. twice daily along with Aldactone 25 mg p.o. twice daily, daily weight advised low-salt diet advised.  Patient advised to elevate lower extremities use TED stockings, recheck BMP within a week  Atrial fibrillation.  Stable, continue propranolol for rate control, continue apixaban for anticoagulation, avoid amiodarone due to elevated LFTs and hypothyroidism   Sepsis secondary to Klebsiella, Enterococcus faecalis UTI --Resolved.  Completed antibiotics-be judicious with antibiotics as patient has history of C. difficile  Strep viridans bacteremia  --  Completed antibiotics per infectious disease-be judicious with antibiotics as patient has history of C. difficile  Anemia acute on chronic; folate deficiency.  Fecal occult blood positive. --Hemoglobin stable at 11.3, repeat CBC within a week, continue folate,  EGD without evidence of bleeding.   PPI may increase risk for C. difficile colitis  NASH cirrhosis, esophageal varices, ascites, splenomegaly, coagulopathy/elevated INR --per oncology: Treated with Vit K, continue propanolol for portal hypertension, avoid hepatotoxic agent  Hypothyroidism, abnormal TSH, follow-up as an outpatient  Recent dx PE/DVT --Continue apixaban  . DVT prophylaxis: Apixaban Code Status: partial Disposition Plan: SNF   Consultants:  Cardiology  GI  Hematology   Procedures:  EGD Impression: - Grade II esophageal varices. - Esophagogastric landmarks  identified. - Portal hypertensive gastropathy. - Normal examined duodenum. - No specimens collected.   Discharge Condition: stable  Follow UP  Contact information for after-discharge care    Destination    [facility] Preferred SNF .   Service:  Skilled Nursing Contact information: [place] [phone]             Diet and Activity recommendation:  As advised  Discharge Instructions    Discharge Instructions    ([place]) Call MD:  Anytime you have any of the following symptoms: 1) 3 pound weight gain in 24 hours or 5 pounds in 1 week 2) shortness of breath, with or without a dry hacking cough 3) swelling in the hands, feet or stomach 4) if you have to sleep on extra pillows at night in order to breathe.   Complete by:  As directed    AMB Referral to [facility]   Complete by:  As directed    Please assign HTA member to [facility] for transition of care and [facility] Social Worker for transportation needs. Written consent obtained. Currently at [facility]. High risk for readmission. Multiple co-morbidities. Please call with questions. Thanks. [name], [place], RN,BSN-[facility] Liaison-[phone]   Reason for consult:  Please assign to [facility] Social Worker   Diagnoses of:   Heart Failure Other     Other Diagnosis:  afib with rvr, sepsis, anemia, pe,   Expected date of contact:  1-3 days (reserved for hospital discharges)   Call MD for:  difficulty breathing, headache or visual disturbances   Complete by:  As directed    Call MD for:  persistant dizziness or light-headedness   Complete by:  As directed    Call MD for:  persistant nausea and vomiting   Complete by:  As directed    Call MD for:  severe uncontrolled pain   Complete by:  As directed    Call MD for:   temperature >100.4   Complete by:  As directed    Diet - low sodium heart healthy   Complete by:  As directed    Discharge instructions   Complete by:  As directed    1) you are taking a blood thinner called apixaban/Eliquis so Avoid ibuprofen/Advil/Aleve/Motrin/Goody Powders/Naproxen/BC powders/Meloxicam/Diclofenac/Indomethacin and other Nonsteroidal anti-inflammatory medications as these will make you more likely to bleed and can cause stomach ulcers, can also cause Kidney problems.  2)Please Place knee-high compression stockings every morning take them off at bedtime 3) please elevate both legs and use Ace wraps for graduated pressure of lower extremities to help with chronic edema/swelling 4))Very low-salt diet advised 5)Weigh yourself daily, call if you gain more  than 3 pounds in 1 day or more than 5 pounds in 1 week as your diuretic medications may need to be adjusted 6)Limit your Fluid  intake to no more than 60 ounces (1.8 Liters) per day 7) repeat BMP and CBC blood test within 1 week of discharge, no later than 12/30/2017   Increase activity slowly   Complete by:  As directed    Place TED hose   Complete by:  As directed    1)Please Place knee-high compression stockings every morning take them off at bedtime 2) please use Ace wraps for graduated pressure of lower extremities to help with chronic edema        Discharge Medications     Allergies as of 12/24/2017      Reactions   Amoxicillin Palpitations   Has patient had a PCN reaction causing immediate rash, facial/tongue/throat swelling, SOB or lightheadedness with hypotension: No Has patient had a PCN reaction causing severe rash involving mucus membranes or skin necrosis: No Has patient had a PCN reaction that required hospitalization: No Has patient had a PCN reaction occurring within the last 10 years: No If all of the above answers are "NO", then may proceed with Cephalosporin use.   Ciprofloxacin Swelling   Sulfonamide  Derivatives Other (See Comments)   See little dots, skin feels like its burning   Robaxin [methocarbamol] Palpitations   Verapamil Palpitations      Medication List    STOP taking these medications   lactulose 10 GM/15ML solution Commonly known as:  CHRONULAC   metoprolol succinate 25 MG 24 hr tablet Commonly known as:  TOPROL-XL   ranitidine 300 MG tablet Commonly known as:  ZANTAC     TAKE these medications   apixaban 5 MG Tabs tablet Commonly known as:  ELIQUIS Take 1 tablet (5 mg total) by mouth 2 (two) times daily. What changed:  Another medication with the same name was removed. Continue taking this medication, and follow the directions you see here.   folic acid 1 MG tablet Commonly known as:  FOLVITE Take 1 tablet (1 mg total) by mouth daily. Start taking on:  12/25/2017   furosemide 40 MG tablet Commonly known as:  LASIX Take 1 tablet (40 mg total) by mouth 2 (two) times daily. What changed:    medication strength  how much to take  when to take this   Gerhardt's butt cream Crea Apply 1 application topically 3 (three) times daily. For 2 weeks   levothyroxine 150 MCG tablet Commonly known as:  SYNTHROID Take 1 tablet (150 mcg total) by mouth daily.   ondansetron 4 MG tablet Commonly known as:  ZOFRAN Take 1 tablet (4 mg total) by mouth every 6 (six) hours as needed for nausea.   propranolol 20 MG tablet Commonly known as:  INDERAL Take 1 tablet (20 mg total) by mouth 2 (two) times daily.   saccharomyces boulardii 250 MG capsule Commonly known as:  FLORASTOR Take 1 capsule (250 mg total) by mouth 2 (two) times daily.   spironolactone 25 MG tablet Commonly known as:  ALDACTONE Take 1 tablet (25 mg total) by mouth 2 (two) times daily. What changed:    medication strength  how much to take  when to take this   vancomycin 50 mg/mL oral solution Commonly known as:  VANCOCIN Take 2.5 mLs (125 mg total) by mouth 4 (four) times daily for 4  days.   Vitamin D-3 1000 units Caps Take 1,000 Units by mouth  2 (two) times daily.       Major procedures and Radiology Reports - PLEASE review detailed and final reports for all details, in brief -    Dg Chest 2 View  Result Date: 12/13/2017 CLINICAL DATA:  Hypotension today. EXAM: CHEST - 2 VIEW COMPARISON:  CT chest 12/04/2017. The single view of the chest 04/14/2017. FINDINGS: There small to moderate bilateral pleural effusions and basilar atelectasis. Cardiomegaly without edema is identified. No pneumothorax. Aortic atherosclerosis is seen. No acute bony abnormality. IMPRESSION: Small to moderate bilateral pleural effusions and basilar atelectasis, greater on the right. Cardiomegaly without edema. Atherosclerosis. Electronically Signed   By: Inge Rise M.D.   On: 12/13/2017 15:46   Ct Angio Chest Pe W/cm &/or Wo Cm  Result Date: 12/04/2017 CLINICAL DATA:  Shortness of breath on exertion, history of deep venous thrombosis. EXAM: CT ANGIOGRAPHY CHEST WITH CONTRAST TECHNIQUE: Multidetector CT imaging of the chest was performed using the standard protocol during bolus administration of intravenous contrast. Multiplanar CT image reconstructions and MIPs were obtained to evaluate the vascular anatomy. CONTRAST:  120mL ISOVUE-370 IOPAMIDOL (ISOVUE-370) INJECTION 76% COMPARISON:  09/09/2017 FINDINGS: Cardiovascular: Thoracic aorta demonstrates atherosclerotic calcifications without aneurysmal dilatation. Opacification is limited precluding evaluation for any possible dissection. Mild cardiac enlargement is seen. No right heart strain is noted. The pulmonary artery shows a normal branching pattern with filling defects in the left lower and upper lobes as well as significant filling defects within the right lower lobe consistent with pulmonary emboli. Mediastinum/Nodes: The esophagus is within normal limits. No significant hilar or mediastinal adenopathy is noted. Thoracic inlet is within normal  limits. Lungs/Pleura: Left lung is well aerated. Minimal left pleural effusion is noted. Mild left basilar atelectasis is seen. The right lung is also well aerated. A large right-sided pleural effusion is noted which is increased in the interval from the prior CT of the abdomen and pelvis. Right lower lobe consolidation is noted related to the underlying pulmonary embolus. This has increased in the interval from the prior exam. Upper Abdomen: Visualized upper abdomen demonstrates cirrhotic change of the liver is well as ascites. There is at least 1 lesion identified within the left lobe of the liver stable in appearance from the prior exam. This has been previously evaluated utilizing MR the abdomen. No other significant upper abdominal abnormality is seen. Musculoskeletal: Degenerative changes of the thoracic spine are noted. Review of the MIP images confirms the above findings. IMPRESSION: Changes consistent with bilateral pulmonary emboli right greater than left with associated changes in the lower lobes bilaterally as well as bilateral pleural effusions right considerably greater than left. No right heart strain is noted. Changes in the upper abdomen consistent with cirrhosis and ascites. Stable benign hepatic lesion is noted. Aortic Atherosclerosis (ICD10-I70.0). Critical Value/emergent results were called by telephone at the time of interpretation on 12/04/2017 at 8:19 pm to [facility] , who verbally acknowledged these results. Electronically Signed   By: Inez Catalina M.D.   On: 12/04/2017 20:21   Ct Renal Stone Study  Result Date: 12/13/2017 CLINICAL DATA:  Nausea, vomiting, weakness, history cirrhosis, GERD, kidney stones, hypertension EXAM: CT ABDOMEN AND PELVIS WITHOUT CONTRAST TECHNIQUE: Multidetector CT imaging of the abdomen and pelvis was performed following the standard protocol without IV contrast. Sagittal and coronal MPR images reconstructed from axial data set. Oral contrast was not  administered. COMPARISON:  09/09/2017 FINDINGS: Lower chest: Biatrial enlargement. Bibasilar pleural effusions moderate RIGHT and small LEFT. Bibasilar atelectasis. Hepatobiliary: Small cirrhotic  liver. Post cholecystectomy. Low-attenuation lesion with peripheral calcification lateral segment LEFT lobe liver 2.0 x 1.9 cm not significantly changed. Pancreas: Atrophic pancreas without mass Spleen: Minimally enlarged, 15.8 x 12.9 x 6.8 cm (volume = 730 cm^3). No focal lesion. Adrenals/Urinary Tract: Unremarkable adrenal glands. Atrophic kidneys with minimal chronic nodularity. Persistent mid LEFT ureteral calculus 7 mm diameter. Bladder decompressed. No additional urinary tract calcification. Stomach/Bowel: Prior bowel resection with anastomotic staple line in RIGHT abdomen likely ileocecectomy. Mild diffuse colonic wall thickening versus artifact from underdistention. Additional staple line in the RIGHT upper quadrant at a small bowel loop. This loop demonstrates diffuse bowel wall thickening and is immediately caudal to the transverse colon. Multiple additional small bowel loops appear dilated and demonstrate diffuse wall thickening. Overall appearance is nonspecific. 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 Vascular/Lymphatic: Scattered atherosclerotic calcifications aorta and iliac arteries. Aorta normal caliber. Reproductive: Uterus surgically absent. Nonvisualization of ovaries. Other: Significant ascites unchanged.  No free air.  No hernia. Musculoskeletal: Osseous demineralization. IMPRESSION: Cirrhotic liver with significant ascites and splenomegaly. Diffuse bowel wall thickening of large and small bowel loops throughout abdomen, present since 09/09/2017; differential diagnosis includes enteritis such as from  infection or inflammatory bowel disease, portal enteropathy in the setting of cirrhosis, ischemia not excluded but considered less likely due to chronicity. Bibasilar effusions and atelectasis greater on RIGHT. Chronic mid LEFT ureteral calculus without hydronephrosis/hydroureter. Electronically Signed   By: Lavonia Dana M.D.   On: 12/13/2017 22:55    Micro Results    Recent Results (from the past 240 hour(s))  C difficile quick scan w PCR reflex     Status: Abnormal   Collection Time: 12/14/17  6:43 PM  Result Value Ref Range Status   C Diff antigen POSITIVE (A) NEGATIVE Final   C Diff toxin POSITIVE (A) NEGATIVE Final   C Diff interpretation Toxin producing C. difficile detected.  Final    Comment: CRITICAL RESULT CALLED TO, READ BACK BY AND VERIFIED WITH: Miguel Dibble RN 2006 12/14/17 A BROWNING Performed at [facility], [place] [address], [place], [place] [address]   Culture, blood (routine x 2)     Status: None   Collection Time: 12/15/17  3:25 AM  Result Value Ref Range Status   Specimen Description BLOOD LEFT ANTECUBITAL  Final   Special Requests   Final    BOTTLES DRAWN AEROBIC ONLY Blood Culture results may not be optimal due to an inadequate volume of blood received in culture bottles   Culture   Final    NO GROWTH 6 DAYS Performed at [facility], [place] [address] [place], [place] [address]    Report Status 12/21/2017 FINAL  Final  Culture, blood (routine x 2)     Status: None   Collection Time: 12/15/17  3:30 AM  Result Value Ref Range Status   Specimen Description BLOOD LEFT ANTECUBITAL  Final   Special Requests   Final    BOTTLES DRAWN AEROBIC ONLY Blood Culture results may not be optimal due to an inadequate volume of blood received in culture bottles   Culture   Final    NO GROWTH 6 DAYS Performed at [facility], [place] [address], [place], [place] [address]    Report Status 12/21/2017 FINAL  Final       Today  Subjective    Brittney Valentine  today has no new complaints, ambulating from bed to chair and chair to bed without significant dyspnea on exertion and hypoxia, voiding well, lower extremity edema persists, no fevers no chills no chest pains OR palpitations,  frequency and volume of stools have improved significantly (oNLY 2 mushy bowel movements in the last 18 hours or so)   Patient has been seen and examined prior to discharge   Objective   Blood pressure 106/73, pulse 75, temperature 97.8 F (36.6 C), temperature source Oral, resp. rate 18, height 5\' 2"  (1.575 m), weight 103.8 kg (228 lb 12.8 oz), SpO2 96 %.  Wt Readings from Last 3 Encounters:  12/24/17 103.8 kg (228 lb 12.8 oz)  12/05/17 99.9 kg (220 lb 3.8 oz)  12/03/17 96.6 kg (213 lb)    Intake/Output Summary (Last 24 hours) at 12/24/2017 0959 Last data filed at 12/24/2017 0000 Gross per 24 hour  Intake 360 ml  Output -  Net 360 ml    Exam Gen:- Awake Alert,  In no apparent distress  HEENT:- [place].AT, No sclera icterus Neck-Supple Neck,No JVD,.  Lungs-  CTAB , good air movement CV- S1, S2 normal, irregular Abd-  +ve B.Sounds, Abd Soft, No tenderness,    Extremity/Skin:-Good pulses, 2+ pitting edema bilaterally (was 3+ previously) psych-affect is appropriate, oriented x3 Neuro-no new focal deficits, no tremors   Data Review   CBC w Diff:  Lab Results  Component Value Date   WBC 7.3 12/24/2017   HGB 11.3 (L) 12/24/2017   HGB 14.5 07/06/2017   HCT 33.6 (L) 12/24/2017   HCT 43.3 07/06/2017   PLT 301 12/24/2017   PLT 357 07/06/2017   LYMPHOPCT 7 12/14/2017   BANDSPCT 7 04/12/2017   MONOPCT 8 12/14/2017   EOSPCT 0 12/14/2017   BASOPCT 0 12/14/2017    CMP:  Lab Results  Component Value Date   NA 138 12/24/2017   NA 144 07/06/2017   K 3.6 12/24/2017   CL 108 12/24/2017   CO2 23 12/24/2017   BUN 28 (H) 12/24/2017   BUN 13 07/06/2017   CREATININE 1.28 (H) 12/24/2017   CREATININE 1.10 (H) 12/03/2017   PROT 4.5 (L) 12/16/2017   PROT 6.2  07/06/2017   ALBUMIN 1.6 (L) 12/16/2017   ALBUMIN 2.6 (L) 07/06/2017   BILITOT 1.0 12/16/2017   BILITOT 2.0 (H) 07/06/2017   ALKPHOS 71 12/16/2017   AST 56 (H) 12/16/2017   ALT 33 12/16/2017  .   Total Discharge time is about 33 minutes  Roxan Hockey M.D on 12/24/2017 at 9:59 AM   Go to [email or web address] - password [email or web address] for contact info  [facility] - Office  [phone]

## 2017-12-26 DIAGNOSIS — K7460 Unspecified cirrhosis of liver: Secondary | ICD-10-CM | POA: Diagnosis not present

## 2017-12-26 DIAGNOSIS — I5043 Acute on chronic combined systolic (congestive) and diastolic (congestive) heart failure: Secondary | ICD-10-CM | POA: Diagnosis not present

## 2017-12-26 DIAGNOSIS — I2699 Other pulmonary embolism without acute cor pulmonale: Secondary | ICD-10-CM | POA: Diagnosis not present

## 2017-12-26 DIAGNOSIS — I480 Paroxysmal atrial fibrillation: Secondary | ICD-10-CM | POA: Diagnosis not present

## 2017-12-26 DIAGNOSIS — A0472 Enterocolitis due to Clostridium difficile, not specified as recurrent: Secondary | ICD-10-CM | POA: Diagnosis not present

## 2017-12-26 DIAGNOSIS — R600 Localized edema: Secondary | ICD-10-CM | POA: Diagnosis not present

## 2017-12-26 DIAGNOSIS — I951 Orthostatic hypotension: Secondary | ICD-10-CM | POA: Diagnosis not present

## 2017-12-29 ENCOUNTER — Encounter (HOSPITAL_COMMUNITY): Payer: Self-pay | Admitting: Nurse Practitioner

## 2017-12-29 ENCOUNTER — Ambulatory Visit (HOSPITAL_COMMUNITY)
Admission: RE | Admit: 2017-12-29 | Discharge: 2017-12-29 | Disposition: A | Discharge status: Home / Self Care | Payer: PPO | Source: Ambulatory Visit | Attending: Nurse Practitioner | Admitting: Nurse Practitioner

## 2017-12-29 VITALS — BP 104/62 | HR 90 | Ht 62.0 in | Wt 211.0 lb

## 2017-12-29 DIAGNOSIS — B029 Zoster without complications: Secondary | ICD-10-CM | POA: Insufficient documentation

## 2017-12-29 DIAGNOSIS — E039 Hypothyroidism, unspecified: Secondary | ICD-10-CM | POA: Insufficient documentation

## 2017-12-29 DIAGNOSIS — Z880 Allergy status to penicillin: Secondary | ICD-10-CM | POA: Insufficient documentation

## 2017-12-29 DIAGNOSIS — Z7901 Long term (current) use of anticoagulants: Secondary | ICD-10-CM | POA: Diagnosis not present

## 2017-12-29 DIAGNOSIS — Z9889 Other specified postprocedural states: Secondary | ICD-10-CM | POA: Diagnosis not present

## 2017-12-29 DIAGNOSIS — I82402 Acute embolism and thrombosis of unspecified deep veins of left lower extremity: Secondary | ICD-10-CM | POA: Diagnosis not present

## 2017-12-29 DIAGNOSIS — Z9049 Acquired absence of other specified parts of digestive tract: Secondary | ICD-10-CM | POA: Insufficient documentation

## 2017-12-29 DIAGNOSIS — I481 Persistent atrial fibrillation: Secondary | ICD-10-CM | POA: Insufficient documentation

## 2017-12-29 DIAGNOSIS — Z882 Allergy status to sulfonamides status: Secondary | ICD-10-CM | POA: Insufficient documentation

## 2017-12-29 DIAGNOSIS — I509 Heart failure, unspecified: Secondary | ICD-10-CM | POA: Insufficient documentation

## 2017-12-29 DIAGNOSIS — Z6838 Body mass index (BMI) 38.0-38.9, adult: Secondary | ICD-10-CM | POA: Diagnosis not present

## 2017-12-29 DIAGNOSIS — A414 Sepsis due to anaerobes: Secondary | ICD-10-CM | POA: Diagnosis not present

## 2017-12-29 DIAGNOSIS — I4819 Other persistent atrial fibrillation: Secondary | ICD-10-CM

## 2017-12-29 DIAGNOSIS — E669 Obesity, unspecified: Secondary | ICD-10-CM | POA: Diagnosis not present

## 2017-12-29 DIAGNOSIS — N390 Urinary tract infection, site not specified: Secondary | ICD-10-CM | POA: Diagnosis not present

## 2017-12-29 DIAGNOSIS — K7460 Unspecified cirrhosis of liver: Secondary | ICD-10-CM | POA: Insufficient documentation

## 2017-12-29 DIAGNOSIS — Z8249 Family history of ischemic heart disease and other diseases of the circulatory system: Secondary | ICD-10-CM | POA: Insufficient documentation

## 2017-12-29 DIAGNOSIS — Z881 Allergy status to other antibiotic agents status: Secondary | ICD-10-CM | POA: Insufficient documentation

## 2017-12-29 DIAGNOSIS — K766 Portal hypertension: Secondary | ICD-10-CM | POA: Insufficient documentation

## 2017-12-29 DIAGNOSIS — I272 Pulmonary hypertension, unspecified: Secondary | ICD-10-CM | POA: Insufficient documentation

## 2017-12-29 DIAGNOSIS — I480 Paroxysmal atrial fibrillation: Secondary | ICD-10-CM | POA: Insufficient documentation

## 2017-12-29 DIAGNOSIS — Z79899 Other long term (current) drug therapy: Secondary | ICD-10-CM | POA: Diagnosis not present

## 2017-12-29 DIAGNOSIS — I110 Hypertensive heart disease with heart failure: Secondary | ICD-10-CM | POA: Insufficient documentation

## 2017-12-29 DIAGNOSIS — Z888 Allergy status to other drugs, medicaments and biological substances status: Secondary | ICD-10-CM | POA: Diagnosis not present

## 2017-12-29 DIAGNOSIS — K3189 Other diseases of stomach and duodenum: Secondary | ICD-10-CM | POA: Insufficient documentation

## 2017-12-29 NOTE — Progress Notes (Signed)
Primary Care Physician: Unk Pinto, MD Referring Physician: Dr. Rayann Heman Cardiologist: Dr. Rene Paci Brittney Valentine is a 77 y.o. female with a h/o afib, for many years controlled on flecainide. But in the setting of acute illness, 04/14/17, it returned. She also had VT in the hospital with acute reduction in EF. She was placed on amiodarone. This  was stopped at the time of Dr. Jackalyn Lombard visit, 07/06/17,  for significant thyroid dysfunction. His plan was to start anticoagulation and in 2-4 weeks, consider hospitalization for sotalol. She has h/o cirrhosis and esophageal varices and was not on anticoagulation. She  was placed on eliquis 5 mg bid after Dr. Rayann Heman discussed with Dr. Percival Spanish. She did have some hematuria but has resolved and she did not have to stop anticoagulation.No other signs of bleeding.  In the clinic 2/26, we discussed Dr.Allred's plan to start sotalol to achieve NSR, after on anticoagulation x 3 weeks and necessary hospitalization. Pt is very clear that she does not want hospitalization at this time. She is just now starting to feel a little better and does not want to come into the hospital or take another med.  F/u in afib clinic, 3/20, one month from above visit to discuss sotalol admission. She has been taking anticoagulation as ordered.  Pt reports over the last month, she has had multiple clear stools a day and has lost 13 lbs.  NO blood noted in diarrhea.She reports taking Keflex in February for UTI. She thought it might be from eliquis but she has not called here or to PCP to report diarrhea. She has lost 13 lbs. She is still fairly asymptomatic with rate controlled afib, but still does not want to entertain the thought of starting sotalol now, best to wait until the diarrhea can be resolved.   F/u afib clinic, 5/16. She is off anticoagulation for abnormal test findings. She is pending a kidney stone removal  Next week and a endo/ colonoscopy after that. She is anxious  to get back on blood thinner when possible as she is now interested in hospitalization for sotalol to resume NSR. The longer she stays in afib the more she feels it is draining her energy.  F/u afib clinic, 6/28. Initially when pt had to come off anticoagulation for further testing/hematuria, the plan was to see pt back resume anticoagulation and see about getting her into the hospital for sotalol load. The pt presents today reporting that she had kidney stone removal/ureteral stent 6/6. She states that she had a syncopal episode after the surgery at home and reported to surgeon. No further syncopal episodes but she has been extremely weak and has noted increased swelling of left leg. She was back in the ER 6/19 for generalized weakness, given fluid and was told that she had an UTI and has now finished antibiotics. She has noted increased swelling of left leg since then, but it was swollen then and reported this but no w/u for leg. She continues in afib, is still pending endoscopy/colonoscoy for esophogeal varices and cirrhosis. EKG shows LBBB.  F/u in afib clinic, 7/23. Since I last saw pt, 6/27, I obtained u/s of her left leg which showed DVT. She was sent to the ER where she was admitted and also diagnosed with PE.  She was started on heparin drip and transitioned to eliquis . She first had afib with RVR but then stayed in rate control afib. She then had another hospitalization for UTI with sepsis, and c  diff. With this she also had afib with RVR. She has some extra fluid and was diuresed. She was seen by GI and had an upper GI without signs of bleeding and given ok to continue eliquis. She is here as this appointment was made in June when she was discovered to have DVT. Pt still wants to purse sotalol to restore SR but currently is in SNF and is quite deconditioned. I feel that after she finishes her rehab, it will better to discuss hospitalization for sotalol, when she gets back to her baseline.  Today,  she denies symptoms of palpitations, chest pain, shortness of breath, orthopnea, PND, lower extremity edema, dizziness, presyncope, syncope, or neurologic sequela. The patient is tolerating medications without difficulties and is otherwise without complaint today.   Past Medical History:  Diagnosis Date  . Acute UTI   . Anemia   . Blood transfusion without reported diagnosis   . Cirrhosis ([place])   . Degenerative disk disease    knees  . Diverticulosis   . Dysrhythmia   . GERD (gastroesophageal reflux disease)   . Hemorrhoids   . History of kidney stones   . Hypertension   . Hypothyroidism   . Obesity   . Paroxysmal atrial fibrillation ([facility])   . Partial small bowel obstruction ([place])   . Personal history of colonic polyps 03/25/2012   8 mm rectal adenoma 03/2012  . Portal hypertensive gastropathy ([place])   . Shingles   . Thyroid disease   . UTI (urinary tract infection) 12/13/2017  . Varices, esophageal ([place])   . Zoster    Past Surgical History:  Procedure Laterality Date  . ABDOMINAL HYSTERECTOMY    . APPENDECTOMY  1991  . CHOLECYSTECTOMY  2000  . COLON SURGERY    . COLONOSCOPY    . CYSTOSCOPY     10-26-17 budyzn  . CYSTOSCOPY/URETEROSCOPY/HOLMIUM LASER/STENT PLACEMENT Left 10/26/2017   Procedure: CYSTOSCOPY, URETEROSCOPY/RETROGRADE/STENT PLACEMENT;  Surgeon: Nickie Retort, MD;  Location: [facility];  Service: Urology;  Laterality: Left;  NEEDS DIGITAL URETEROSCOPE  . CYSTOSCOPY/URETEROSCOPY/HOLMIUM LASER/STENT PLACEMENT Left 11/12/2017   Procedure: CYSTOSCOPY LEFT RETROGRADE PYELOGRAM LEFT URETEROSCOPY HOLMIUM LASER AND LEFT URETERAL STENT EXCHANGE AND LASER ENDO URETEROTOMY;  Surgeon: Ardis Hughs, MD;  Location: [facility];  Service: Urology;  Laterality: Left;  . ESOPHAGOGASTRODUODENOSCOPY  multiple  . ESOPHAGOGASTRODUODENOSCOPY N/A 12/16/2017   Procedure: ESOPHAGOGASTRODUODENOSCOPY (EGD);  Surgeon: Mauri Pole, MD;  Location: [facility] ENDOSCOPY;  Service: Endoscopy;   Laterality: N/A;    Current Outpatient Medications  Medication Sig Dispense Refill  . apixaban (ELIQUIS) 5 MG TABS tablet Take 1 tablet (5 mg total) by mouth 2 (two) times daily. 60 tablet 2  . Cholecalciferol (VITAMIN D-3) 1000 UNITS CAPS Take 1,000 Units by mouth 2 (two) times daily.     . folic acid (FOLVITE) 1 MG tablet Take 1 tablet (1 mg total) by mouth daily. 30 tablet 2  . furosemide (LASIX) 40 MG tablet Take 1 tablet (40 mg total) by mouth 2 (two) times daily. 60 tablet 2  . Hydrocortisone (GERHARDT'S BUTT CREAM) CREA Apply 1 application topically 3 (three) times daily. For 2 weeks 1 each 0  . levothyroxine (SYNTHROID) 150 MCG tablet Take 1 tablet (150 mcg total) by mouth daily. 30 tablet 11  . ondansetron (ZOFRAN) 4 MG tablet Take 1 tablet (4 mg total) by mouth every 6 (six) hours as needed for nausea. 20 tablet 0  . propranolol (INDERAL) 20 MG tablet Take 1 tablet (20 mg total)  by mouth 2 (two) times daily. 60 tablet 5  . saccharomyces boulardii (FLORASTOR) 250 MG capsule Take 1 capsule (250 mg total) by mouth 2 (two) times daily. 60 capsule 3  . spironolactone (ALDACTONE) 25 MG tablet Take 1 tablet (25 mg total) by mouth 2 (two) times daily. 60 tablet 2   No current facility-administered medications for this encounter.     Allergies  Allergen Reactions  . Amoxicillin Palpitations    Has patient had a PCN reaction causing immediate rash, facial/tongue/throat swelling, SOB or lightheadedness with hypotension: No Has patient had a PCN reaction causing severe rash involving mucus membranes or skin necrosis: No Has patient had a PCN reaction that required hospitalization: No Has patient had a PCN reaction occurring within the last 10 years: No If all of the above answers are "NO", then may proceed with Cephalosporin use.   . Ciprofloxacin Swelling  . Sulfonamide Derivatives Other (See Comments)    See little dots, skin feels like its burning  . Robaxin [Methocarbamol]  Palpitations  . Verapamil Palpitations    Social History   Socioeconomic History  . Marital status: Single    Spouse name: Not on file  . Number of children: Not on file  . Years of education: Not on file  . Highest education level: Not on file  Occupational History  . Not on file  Social Needs  . Financial resource strain: Not on file  . Food insecurity:    Worry: Not on file    Inability: Not on file  . Transportation needs:    Medical: Not on file    Non-medical: Not on file  Tobacco Use  . Smoking status: Never Smoker  . Smokeless tobacco: Never Used  Substance and Sexual Activity  . Alcohol use: No  . Drug use: No  . Sexual activity: Not Currently    Partners: Male  Lifestyle  . Physical activity:    Days per week: Not on file    Minutes per session: Not on file  . Stress: Not on file  Relationships  . Social connections:    Talks on phone: Not on file    Gets together: Not on file    Attends religious service: Not on file    Active member of club or organization: Not on file    Attends meetings of clubs or organizations: Not on file    Relationship status: Not on file  . Intimate partner violence:    Fear of current or ex partner: Not on file    Emotionally abused: Not on file    Physically abused: Not on file    Forced sexual activity: Not on file  Other Topics Concern  . Not on file  Social History Narrative  . Not on file    Family History  Problem Relation Age of Onset  . Heart failure Mother        died from  . Hypertension Mother   . Heart attack Father        died from  . Hypertension Sister     ROS- All systems are reviewed and negative except as per the HPI above  Physical Exam: Vitals:   12/29/17 1403  BP: 104/62  Pulse: 90  Weight: 211 lb (95.7 kg)  Height: 5\' 2"  (1.575 m)   Wt Readings from Last 3 Encounters:  12/29/17 211 lb (95.7 kg)  12/24/17 228 lb 12.8 oz (103.8 kg)  12/05/17 220 lb 3.8 oz (99.9 kg)  Labs: Lab  Results  Component Value Date   NA 138 12/24/2017   K 3.6 12/24/2017   CL 108 12/24/2017   CO2 23 12/24/2017   GLUCOSE 135 (H) 12/24/2017   BUN 28 (H) 12/24/2017   CREATININE 1.28 (H) 12/24/2017   CALCIUM 8.9 12/24/2017   PHOS 5.5 (H) 04/12/2017   MG 2.0 12/16/2017   Lab Results  Component Value Date   INR 1.77 12/16/2017   Lab Results  Component Value Date   CHOL 135 09/02/2017   HDL 46 (L) 09/02/2017   LDLCALC 74 09/02/2017   TRIG 74 09/02/2017     GEN- The patient is chronically ill appearing, alert and oriented x 3 today, color sallow.   Head- normocephalic, atraumatic Eyes-  Sclera clear, conjunctiva pink Ears- hearing intact Oropharynx- clear Neck- supple, no JVP Lymph- no cervical lymphadenopathy Lungs- Clear to ausculation bilaterally, normal work of breathing Heart- irregular rate and rhythm, no murmurs, rubs or gallops, PMI not laterally displaced GI- soft, NT, ND, + BS Extremities- no clubbing, cyanosis,  trace edema rt leg, 2+ edema of Left LE up to knee area MS- no significant deformity or atrophy Skin- no rash or lesion Psych- euthymic mood, full affect Neuro- strength and sensation are intact  EKG- afib at 106 bpm, Qrs int 140 ms, Qtc 435 ms Epic records reviewed Echo- 11/2017-Study Conclusions Study Conclusions  - Left ventricle: The cavity size was normal. Wall thickness was   increased in a pattern of mild LVH. Systolic function was normal.   The estimated ejection fraction was in the range of 55% to 60%.   Wall motion was normal; there were no regional wall motion   abnormalities. - Mitral valve: Calcified annulus. There was mild regurgitation. - Left atrium: The atrium was moderately dilated. - Right atrium: The atrium was moderately dilated. - Pulmonary arteries: Systolic pressure was mildly increased. PA   peak pressure: 34 mm Hg (S).  Impressions:  - Normal LV function; mild LVH; mild MR; moderate biatrial   enlargement; mild TR;  mild pulmonary hypertension.     Assessment and Plan: 1. Persistent afib Off amiodarone due to liver concerns and failed flecainde Reasonable rate control, no change in BB  2.Chadsvasc score of 4 Continue eliquis 5 mg bid  H/o cirrhosis, esophageal varices, recent upper endoscopy showed no bleeding, no reason to stop eliquis  3. DVT/PE Continue eliquis  4.  UTI/SEpsis Resolved   5. C diff Improved  6. CHF Weight stable  Continue diuretic, spironolactone   Will see back in 5 weeks after she finishes rehab, will try to pursue sotalol at that time, if health stays stable    Butch Penny C. Carroll, [facility] [address] [place], [place] [address] [phone]

## 2017-12-30 ENCOUNTER — Other Ambulatory Visit: Payer: Self-pay | Admitting: *Deleted

## 2017-12-30 NOTE — Patient Outreach (Signed)
[place] [facility]) Care Management  12/30/2017  TAMEYA KUZNIA Nov 28, 1940 [id number]   CSW was unable to reach pt or SNF rep today to confirm pt's admit to [facility]. CSW left [employer]IP[employer] comlpiant voice message and will try again in the next 3 business days.    Eduard Clos, MSW, [place] Worker  [place] [phone]

## 2017-12-31 ENCOUNTER — Ambulatory Visit: Payer: Self-pay | Admitting: *Deleted

## 2018-01-01 ENCOUNTER — Other Ambulatory Visit: Payer: Self-pay | Admitting: *Deleted

## 2018-01-01 ENCOUNTER — Ambulatory Visit: Payer: PPO | Admitting: *Deleted

## 2018-01-01 NOTE — Patient Outreach (Signed)
[place] [facility]) Care Management  01/01/2018  Brittney Valentine 06/28/1940 [id number]   [place] visited pt at [facility] rehab today where she is receiving therapy. Per SNF rep, pt is on contact precautions (uncertain why) and is ambulating with PT (25 feet with RW) .She is confused per SNF staff and no family present. CSW left [facility] brochure and contact info at room and will plan a phone outreach to SNF and family listed on consent for further assessment.   Eduard Clos, MSW, [place] Worker  [place] [phone]

## 2018-01-03 DIAGNOSIS — N390 Urinary tract infection, site not specified: Secondary | ICD-10-CM | POA: Diagnosis not present

## 2018-01-03 DIAGNOSIS — R400 Somnolence: Secondary | ICD-10-CM | POA: Diagnosis not present

## 2018-01-03 DIAGNOSIS — I480 Paroxysmal atrial fibrillation: Secondary | ICD-10-CM | POA: Diagnosis not present

## 2018-01-04 ENCOUNTER — Ambulatory Visit: Payer: Self-pay | Admitting: *Deleted

## 2018-01-04 ENCOUNTER — Other Ambulatory Visit: Payer: Self-pay | Admitting: *Deleted

## 2018-01-04 NOTE — Patient Outreach (Signed)
[facility]) Care Management  01/04/2018  Brittney Valentine [birth year] [id number]   [place] visited pt at [facility] last week and per SNF rep, pt has become confused and has been on contact precautions.  CSW left [facility] brochure outside of pt's room. CSW attempted to reach pt's brother, Brittney Valentine, by phone today (on [facility] consent) but no voicemail. CSW will try nephew as well.  Per SNF rep, pt was living alone pta.    Eduard Clos, MSW, [place] Worker  [place] [phone]

## 2018-01-05 ENCOUNTER — Other Ambulatory Visit: Payer: Self-pay | Admitting: *Deleted

## 2018-01-05 NOTE — Patient Outreach (Signed)
[place] [facility]) Care Management  01/05/2018  Brittney Valentine 1941-05-30 [id number]   [place] visited pt at [facility] today and confirmed pt identity and introduced self and role. Pt reports she has AFIB and is being treated for that as well as LE swelling (legs wrapped). She is receiving therapy at SNF and is planning for dc home alone. Pt was independent, driiving and living alone prior to admission. She is interested in considering some help in the home; maybe private duty care along with [facility] care. CSW discussed this with pt and will also make SNF rep aware.  CSW also spoke with Coralyn Mark, nephew, by phone.  CSW will follow along while pt is at SNF to collaborate and assist/support as needed.   Eduard Clos, MSW, [place] Worker  [place] [phone]

## 2018-01-11 ENCOUNTER — Other Ambulatory Visit: Payer: Self-pay | Admitting: *Deleted

## 2018-01-11 ENCOUNTER — Ambulatory Visit: Payer: PPO | Admitting: Internal Medicine

## 2018-01-11 NOTE — Patient Outreach (Signed)
[place] [facility]) Care Management  01/11/2018  ILKA LOVICK 10/07/1940 [id number]   CSW spoke with pt by phone today who indicates no SNF discharge date has been determined.  "I am nauseated and almost passed out". Pt indicates some constipation and low BP issues. She reports they are giving her laxatives and anti- nausea drugs.  She reports her therapy is going "pretty good".  CSW encouraged pt to continue her workf with PT/OT- CSW advised pt of plans to check in with SNF rep and probable SNF visit later in the week.   Eduard Clos, MSW, [place] Worker  [place] [phone]

## 2018-01-15 ENCOUNTER — Other Ambulatory Visit: Payer: Self-pay | Admitting: *Deleted

## 2018-01-15 NOTE — Patient Outreach (Signed)
[place] [facility]) Care Management  01/15/2018  Brittney Valentine Jun 30, 1940 [id number]   CSW spoke with SNF rep today who indicates pt is doing fair; she has had some medical setbacks and has been placed on IV fluids with plans for Dr Philip Aspen to come see her at [facility] on Sunday. CSW will plan a f/u call or visit to SNF next week for further updates.   Eduard Clos, MSW, [place] Worker  [place] [phone]

## 2018-01-17 ENCOUNTER — Inpatient Hospital Stay (HOSPITAL_COMMUNITY)
Admission: EM | Admit: 2018-01-17 | Discharge: 2018-01-21 | DRG: 682 | Disposition: A | Discharge status: Hospice | Payer: PPO | Source: Skilled Nursing Facility | Attending: Family Medicine | Admitting: Family Medicine

## 2018-01-17 ENCOUNTER — Encounter (HOSPITAL_COMMUNITY): Payer: Self-pay

## 2018-01-17 ENCOUNTER — Other Ambulatory Visit: Payer: Self-pay

## 2018-01-17 ENCOUNTER — Emergency Department (HOSPITAL_COMMUNITY): Payer: PPO

## 2018-01-17 DIAGNOSIS — I447 Left bundle-branch block, unspecified: Secondary | ICD-10-CM | POA: Diagnosis not present

## 2018-01-17 DIAGNOSIS — R0602 Shortness of breath: Secondary | ICD-10-CM

## 2018-01-17 DIAGNOSIS — J9601 Acute respiratory failure with hypoxia: Secondary | ICD-10-CM | POA: Diagnosis not present

## 2018-01-17 DIAGNOSIS — Z515 Encounter for palliative care: Secondary | ICD-10-CM | POA: Diagnosis not present

## 2018-01-17 DIAGNOSIS — N202 Calculus of kidney with calculus of ureter: Secondary | ICD-10-CM | POA: Diagnosis not present

## 2018-01-17 DIAGNOSIS — K3189 Other diseases of stomach and duodenum: Secondary | ICD-10-CM | POA: Diagnosis present

## 2018-01-17 DIAGNOSIS — E8809 Other disorders of plasma-protein metabolism, not elsewhere classified: Secondary | ICD-10-CM | POA: Diagnosis present

## 2018-01-17 DIAGNOSIS — K767 Hepatorenal syndrome: Secondary | ICD-10-CM | POA: Diagnosis present

## 2018-01-17 DIAGNOSIS — E875 Hyperkalemia: Secondary | ICD-10-CM | POA: Diagnosis present

## 2018-01-17 DIAGNOSIS — K219 Gastro-esophageal reflux disease without esophagitis: Secondary | ICD-10-CM | POA: Diagnosis not present

## 2018-01-17 DIAGNOSIS — I8500 Esophageal varices without bleeding: Secondary | ICD-10-CM | POA: Diagnosis not present

## 2018-01-17 DIAGNOSIS — R140 Abdominal distension (gaseous): Secondary | ICD-10-CM | POA: Diagnosis not present

## 2018-01-17 DIAGNOSIS — Z86711 Personal history of pulmonary embolism: Secondary | ICD-10-CM

## 2018-01-17 DIAGNOSIS — K2210 Ulcer of esophagus without bleeding: Secondary | ICD-10-CM | POA: Diagnosis not present

## 2018-01-17 DIAGNOSIS — K920 Hematemesis: Secondary | ICD-10-CM | POA: Diagnosis not present

## 2018-01-17 DIAGNOSIS — I10 Essential (primary) hypertension: Secondary | ICD-10-CM | POA: Diagnosis not present

## 2018-01-17 DIAGNOSIS — I5033 Acute on chronic diastolic (congestive) heart failure: Secondary | ICD-10-CM | POA: Diagnosis not present

## 2018-01-17 DIAGNOSIS — Z7901 Long term (current) use of anticoagulants: Secondary | ICD-10-CM | POA: Diagnosis not present

## 2018-01-17 DIAGNOSIS — I8501 Esophageal varices with bleeding: Secondary | ICD-10-CM | POA: Diagnosis not present

## 2018-01-17 DIAGNOSIS — I110 Hypertensive heart disease with heart failure: Secondary | ICD-10-CM | POA: Diagnosis present

## 2018-01-17 DIAGNOSIS — E872 Acidosis: Secondary | ICD-10-CM | POA: Diagnosis present

## 2018-01-17 DIAGNOSIS — R188 Other ascites: Secondary | ICD-10-CM | POA: Diagnosis present

## 2018-01-17 DIAGNOSIS — K7469 Other cirrhosis of liver: Secondary | ICD-10-CM | POA: Diagnosis not present

## 2018-01-17 DIAGNOSIS — Z6841 Body Mass Index (BMI) 40.0 and over, adult: Secondary | ICD-10-CM | POA: Diagnosis not present

## 2018-01-17 DIAGNOSIS — Z66 Do not resuscitate: Secondary | ICD-10-CM | POA: Diagnosis present

## 2018-01-17 DIAGNOSIS — Z86718 Personal history of other venous thrombosis and embolism: Secondary | ICD-10-CM

## 2018-01-17 DIAGNOSIS — Z87440 Personal history of urinary (tract) infections: Secondary | ICD-10-CM | POA: Diagnosis not present

## 2018-01-17 DIAGNOSIS — N179 Acute kidney failure, unspecified: Secondary | ICD-10-CM | POA: Diagnosis not present

## 2018-01-17 DIAGNOSIS — D649 Anemia, unspecified: Secondary | ICD-10-CM | POA: Diagnosis not present

## 2018-01-17 DIAGNOSIS — K7031 Alcoholic cirrhosis of liver with ascites: Secondary | ICD-10-CM | POA: Diagnosis not present

## 2018-01-17 DIAGNOSIS — R9431 Abnormal electrocardiogram [ECG] [EKG]: Secondary | ICD-10-CM | POA: Diagnosis not present

## 2018-01-17 DIAGNOSIS — I959 Hypotension, unspecified: Secondary | ICD-10-CM | POA: Diagnosis not present

## 2018-01-17 DIAGNOSIS — K921 Melena: Secondary | ICD-10-CM | POA: Diagnosis not present

## 2018-01-17 DIAGNOSIS — D62 Acute posthemorrhagic anemia: Secondary | ICD-10-CM | POA: Diagnosis not present

## 2018-01-17 DIAGNOSIS — I4891 Unspecified atrial fibrillation: Secondary | ICD-10-CM | POA: Diagnosis not present

## 2018-01-17 DIAGNOSIS — K922 Gastrointestinal hemorrhage, unspecified: Secondary | ICD-10-CM

## 2018-01-17 DIAGNOSIS — K7581 Nonalcoholic steatohepatitis (NASH): Secondary | ICD-10-CM | POA: Diagnosis present

## 2018-01-17 DIAGNOSIS — Z8619 Personal history of other infectious and parasitic diseases: Secondary | ICD-10-CM

## 2018-01-17 DIAGNOSIS — R079 Chest pain, unspecified: Secondary | ICD-10-CM | POA: Diagnosis not present

## 2018-01-17 DIAGNOSIS — K7460 Unspecified cirrhosis of liver: Secondary | ICD-10-CM

## 2018-01-17 DIAGNOSIS — Z8249 Family history of ischemic heart disease and other diseases of the circulatory system: Secondary | ICD-10-CM

## 2018-01-17 DIAGNOSIS — E669 Obesity, unspecified: Secondary | ICD-10-CM | POA: Diagnosis present

## 2018-01-17 DIAGNOSIS — I82402 Acute embolism and thrombosis of unspecified deep veins of left lower extremity: Secondary | ICD-10-CM | POA: Diagnosis not present

## 2018-01-17 DIAGNOSIS — I272 Pulmonary hypertension, unspecified: Secondary | ICD-10-CM | POA: Diagnosis not present

## 2018-01-17 DIAGNOSIS — Z79890 Hormone replacement therapy (postmenopausal): Secondary | ICD-10-CM

## 2018-01-17 DIAGNOSIS — R000 Tachycardia, unspecified: Secondary | ICD-10-CM | POA: Diagnosis not present

## 2018-01-17 DIAGNOSIS — R1013 Epigastric pain: Secondary | ICD-10-CM | POA: Diagnosis not present

## 2018-01-17 DIAGNOSIS — I480 Paroxysmal atrial fibrillation: Secondary | ICD-10-CM | POA: Diagnosis present

## 2018-01-17 DIAGNOSIS — I481 Persistent atrial fibrillation: Secondary | ICD-10-CM | POA: Diagnosis present

## 2018-01-17 DIAGNOSIS — E039 Hypothyroidism, unspecified: Secondary | ICD-10-CM | POA: Diagnosis present

## 2018-01-17 DIAGNOSIS — R791 Abnormal coagulation profile: Secondary | ICD-10-CM | POA: Diagnosis present

## 2018-01-17 DIAGNOSIS — R062 Wheezing: Secondary | ICD-10-CM

## 2018-01-17 DIAGNOSIS — I509 Heart failure, unspecified: Secondary | ICD-10-CM | POA: Diagnosis not present

## 2018-01-17 DIAGNOSIS — R710 Precipitous drop in hematocrit: Secondary | ICD-10-CM | POA: Diagnosis not present

## 2018-01-17 LAB — CBC
HCT: 35.4 % — ABNORMAL LOW (ref 36.0–46.0)
Hemoglobin: 11 g/dL — ABNORMAL LOW (ref 12.0–15.0)
MCH: 29.6 pg (ref 26.0–34.0)
MCHC: 31.1 g/dL (ref 30.0–36.0)
MCV: 95.4 fL (ref 78.0–100.0)
PLATELETS: 619 10*3/uL — AB (ref 150–400)
RBC: 3.71 MIL/uL — ABNORMAL LOW (ref 3.87–5.11)
RDW: 19.5 % — ABNORMAL HIGH (ref 11.5–15.5)
WBC: 11.2 10*3/uL — ABNORMAL HIGH (ref 4.0–10.5)

## 2018-01-17 LAB — I-STAT CHEM 8, ED
BUN: 73 mg/dL — AB (ref 8–23)
CHLORIDE: 102 mmol/L (ref 98–111)
Calcium, Ion: 1.23 mmol/L (ref 1.15–1.40)
Creatinine, Ser: 4.3 mg/dL — ABNORMAL HIGH (ref 0.44–1.00)
Glucose, Bld: 89 mg/dL (ref 70–99)
HEMATOCRIT: 34 % — AB (ref 36.0–46.0)
Hemoglobin: 11.6 g/dL — ABNORMAL LOW (ref 12.0–15.0)
Potassium: 5.2 mmol/L — ABNORMAL HIGH (ref 3.5–5.1)
SODIUM: 134 mmol/L — AB (ref 135–145)
TCO2: 24 mmol/L (ref 22–32)

## 2018-01-17 LAB — COMPREHENSIVE METABOLIC PANEL
ALBUMIN: 2.1 g/dL — AB (ref 3.5–5.0)
ALT: 38 U/L (ref 0–44)
AST: 68 U/L — AB (ref 15–41)
Alkaline Phosphatase: 85 U/L (ref 38–126)
Anion gap: 9 (ref 5–15)
BUN: 78 mg/dL — AB (ref 8–23)
CHLORIDE: 103 mmol/L (ref 98–111)
CO2: 22 mmol/L (ref 22–32)
CREATININE: 4 mg/dL — AB (ref 0.44–1.00)
Calcium: 9.9 mg/dL (ref 8.9–10.3)
GFR calc Af Amer: 12 mL/min — ABNORMAL LOW (ref >60)
GFR calc non Af Amer: 10 mL/min — ABNORMAL LOW (ref >60)
Glucose, Bld: 91 mg/dL (ref 70–99)
Potassium: 5.3 mmol/L — ABNORMAL HIGH (ref 3.5–5.1)
SODIUM: 134 mmol/L — AB (ref 135–145)
Total Bilirubin: 2 mg/dL — ABNORMAL HIGH (ref 0.3–1.2)
Total Protein: 5.9 g/dL — ABNORMAL LOW (ref 6.5–8.1)

## 2018-01-17 LAB — BASIC METABOLIC PANEL
ANION GAP: 13 (ref 5–15)
BUN: 80 mg/dL — AB (ref 8–23)
CALCIUM: 9.7 mg/dL (ref 8.9–10.3)
CO2: 17 mmol/L — ABNORMAL LOW (ref 22–32)
Chloride: 105 mmol/L (ref 98–111)
Creatinine, Ser: 3.8 mg/dL — ABNORMAL HIGH (ref 0.44–1.00)
GFR calc Af Amer: 12 mL/min — ABNORMAL LOW (ref >60)
GFR calc non Af Amer: 11 mL/min — ABNORMAL LOW (ref >60)
Glucose, Bld: 80 mg/dL (ref 70–99)
Potassium: 5.6 mmol/L — ABNORMAL HIGH (ref 3.5–5.1)
Sodium: 135 mmol/L (ref 135–145)

## 2018-01-17 LAB — LIPASE, BLOOD: LIPASE: 80 U/L — AB (ref 11–51)

## 2018-01-17 LAB — LACTIC ACID, PLASMA: Lactic Acid, Venous: 2.3 mmol/L (ref 0.5–1.9)

## 2018-01-17 MED ORDER — FAMOTIDINE IN NACL 20-0.9 MG/50ML-% IV SOLN
20.0000 mg | Freq: Two times a day (BID) | INTRAVENOUS | Status: DC
  Administered 2018-01-17: 20 mg via INTRAVENOUS
  Filled 2018-01-17: qty 50

## 2018-01-17 MED ORDER — SODIUM CHLORIDE 0.9 % IV BOLUS
1000.0000 mL | Freq: Once | INTRAVENOUS | Status: AC
  Administered 2018-01-17: 1000 mL via INTRAVENOUS

## 2018-01-17 MED ORDER — DILTIAZEM HCL 25 MG/5ML IV SOLN
15.0000 mg | Freq: Once | INTRAVENOUS | Status: AC
  Administered 2018-01-17: 15 mg via INTRAVENOUS
  Filled 2018-01-17: qty 5

## 2018-01-17 MED ORDER — ONDANSETRON 4 MG PO TBDP: 4.0000 mg | ORAL_TABLET | Freq: Four times a day (QID) | ORAL | Status: DC | PRN

## 2018-01-17 MED ORDER — SODIUM CHLORIDE 0.9 % IV SOLN
INTRAVENOUS | Status: DC
  Administered 2018-01-17: 15:00:00 via INTRAVENOUS

## 2018-01-17 MED ORDER — DILTIAZEM HCL-DEXTROSE 100-5 MG/100ML-% IV SOLN (PREMIX)
5.0000 mg/h | INTRAVENOUS | Status: DC
  Administered 2018-01-17 – 2018-01-19 (×4): 5 mg/h via INTRAVENOUS
  Filled 2018-01-17 (×3): qty 100

## 2018-01-17 MED ORDER — DILTIAZEM LOAD VIA INFUSION
15.0000 mg | Freq: Once | INTRAVENOUS | Status: DC
  Filled 2018-01-17: qty 15

## 2018-01-17 MED ORDER — PANTOPRAZOLE SODIUM 40 MG PO TBEC: 40.0000 mg | DELAYED_RELEASE_TABLET | Freq: Two times a day (BID) | ORAL | Status: DC

## 2018-01-17 MED ORDER — SODIUM CHLORIDE 0.9% FLUSH
3.0000 mL | Freq: Two times a day (BID) | INTRAVENOUS | Status: DC
  Administered 2018-01-18 – 2018-01-21 (×5): 3 mL via INTRAVENOUS

## 2018-01-17 MED ORDER — DILTIAZEM LOAD VIA INFUSION
20.0000 mg | Freq: Once | INTRAVENOUS | Status: AC
  Administered 2018-01-17: 20 mg via INTRAVENOUS
  Filled 2018-01-17: qty 20

## 2018-01-17 MED ORDER — SODIUM CHLORIDE 0.9 % IV SOLN
INTRAVENOUS | Status: DC
  Administered 2018-01-18: via INTRAVENOUS

## 2018-01-17 MED ORDER — DILTIAZEM HCL-DEXTROSE 100-5 MG/100ML-% IV SOLN (PREMIX): 5.0000 mg/h | INTRAVENOUS | Status: DC

## 2018-01-17 MED ORDER — LEVOTHYROXINE SODIUM 100 MCG PO TABS
200.0000 ug | ORAL_TABLET | Freq: Every day | ORAL | Status: DC
  Administered 2018-01-18 – 2018-01-19 (×2): 200 ug via ORAL
  Filled 2018-01-17: qty 1
  Filled 2018-01-17: qty 2
  Filled 2018-01-17: qty 1

## 2018-01-17 MED ORDER — FAMOTIDINE IN NACL 20-0.9 MG/50ML-% IV SOLN
20.0000 mg | INTRAVENOUS | Status: DC
  Administered 2018-01-18: 20 mg via INTRAVENOUS
  Filled 2018-01-17: qty 50

## 2018-01-17 MED ORDER — SODIUM CHLORIDE 0.9 % IV SOLN
1.0000 g | Freq: Once | INTRAVENOUS | Status: AC
  Administered 2018-01-17: 1 g via INTRAVENOUS
  Filled 2018-01-17: qty 10

## 2018-01-17 NOTE — ED Notes (Signed)
BP now above 100-S, report called to [facility] on 4E

## 2018-01-17 NOTE — ED Provider Notes (Signed)
Pt signed out by Dr. Rogene Houston pending CT abd/pelvis.  The Ct was delayed due to traumas and strokes.    CT result  IMPRESSION: Changes consistent with cirrhosis of the liver with stable hypodense lesions in both the right and left lobes as well as changes of ascites. The known esophageal varices are not well appreciated on this exam. Increasing changes of anasarca when compared with the prior study.  Bilateral pleural effusions with associated lower lobe atelectasis/infiltrate. This is stable from the prior exam.  Distal left ureteral stone which has migrated slightly in the interval from the prior exam. A tiny lower pole left renal stone is noted.  Diffuse small bowel wall thickening likely related to the underlying ascites  Pt started on a cardizem drip which has helped HR.  No evidence vomiting blood while here.  Initial hgb stable.  Pt also given multiple liters of IVFs.  Pt d/w Dr. Stana Bunting (triad) for admission.  CRITICAL CARE Performed by: Isla Pence   Total critical care time: 45 minutes  Critical care time was exclusive of separately billable procedures and treating other patients.  Critical care was necessary to treat or prevent imminent or life-threatening deterioration.  Critical care was time spent personally by me on the following activities: development of treatment plan with patient and/or surrogate as well as nursing, discussions with consultants, evaluation of patient's response to treatment, examination of patient, obtaining history from patient or surrogate, ordering and performing treatments and interventions, ordering and review of laboratory studies, ordering and review of radiographic studies, pulse oximetry and re-evaluation of patient's condition.   Isla Pence, MD 01/17/18 2049

## 2018-01-17 NOTE — ED Triage Notes (Signed)
Pt brought in by [facility] from [place] home for coffee ground emesis since this am. Pt has hx of afib- is on eliquis, pt also has hx of esophageal varices. Pt endorses SOB with exertion. Per EMS pt's abdomen is rigid d/t subcutaneous fluid admin at facility. Pt is not eating, states even the sight of food makes her nauseous. Pt denies any pain. Pt in NAD at this time.

## 2018-01-17 NOTE — ED Provider Notes (Signed)
[facility] EMERGENCY DEPARTMENT Provider Note   CSN: [id number] Arrival date & time: 01/17/18  1424     History   Chief Complaint Chief Complaint  Patient presents with  . Emesis  . Shortness of Breath    HPI Brittney Valentine is a 77 y.o. female.  Patient brought in by EMS from collapse nursing home for coffee-ground emesis this morning.  Patient has a history of atrial fibrillation and is on Eliquis.  Nursing also reported the patient has a history of esophageal varices.  EMS reported that there was some shortness of breath with exertion patient denied any significant shortness of breath to me.  Patient had been receiving some subcutaneous IV fluids to the fact that she has not been eating or drinking very well for the past several days.  Patient denies any pain.     Past Medical History:  Diagnosis Date  . Acute UTI   . Anemia   . Blood transfusion without reported diagnosis   . Cirrhosis ([place])   . Degenerative disk disease    knees  . Diverticulosis   . Dysrhythmia   . GERD (gastroesophageal reflux disease)   . Hemorrhoids   . History of kidney stones   . Hypertension   . Hypothyroidism   . Obesity   . Paroxysmal atrial fibrillation (HCC)   . Partial small bowel obstruction ([place])   . Personal history of colonic polyps 03/25/2012   8 mm rectal adenoma 03/2012  . Portal hypertensive gastropathy ([place])   . Shingles   . Thyroid disease   . UTI (urinary tract infection) 12/13/2017  . Varices, esophageal ([place])   . Zoster     Patient Active Problem List   Diagnosis Date Noted  . Acute on chronic combined systolic and diastolic CHF (congestive heart failure) ([place]) 12/22/2017  . Coagulopathy ([place])   . Bacteremia due to Streptococcus ([place])    . Clostridium difficile colitis   . GERD (gastroesophageal reflux disease) 12/04/2017  . DVT (deep venous thrombosis) ([place]) 12/04/2017  . PE (pulmonary thromboembolism) ([place]) 12/04/2017  . Pleural  effusion 12/04/2017  . Acute pulmonary embolism without acute cor pulmonale (HCC)   . Ascites 09/29/2017  . Chronic diarrhea 09/04/2017  . Long term current use of anticoagulant - apixaban 09/04/2017  . Fatigue 05/28/2017  . Chronic combined systolic and diastolic CHF (congestive heart failure) ([place])   . Dilated cardiomyopathy ([place])   . Contraindication to anticoagulation therapy   . Bacterial cystitis 04/11/2017  . Reactive airway disease, mild intermittent, uncomplicated 23/76/2831  . Obstructive sleep apnea 01/08/2015  . Essential hypertension 12/21/2014  . Prediabetes 12/21/2014  . Hypothyroidism 12/21/2014  . Encounter for Medicare annual wellness exam 12/20/2014  . Medication management 09/12/2014  . Vitamin D deficiency 09/12/2014  . Atrial fibrillation with RVR ([place]) 05/11/2014  . Esophageal varices ([place]) 02/09/2014  . Left sided sciatica 02/09/2014  . History of colonic polyps 03/25/2012  . Obesity 09/27/2009  . History of small bowel obstruction 12/31/2007  . Hepatic cirrhosis ([place]) 12/31/2007  . PORTAL HYPERTENSION 12/31/2007    Past Surgical History:  Procedure Laterality Date  . ABDOMINAL HYSTERECTOMY    . APPENDECTOMY  1991  . CHOLECYSTECTOMY  2000  . COLON SURGERY    . COLONOSCOPY    . CYSTOSCOPY     10-26-17 budyzn  . CYSTOSCOPY/URETEROSCOPY/HOLMIUM LASER/STENT PLACEMENT Left 10/26/2017   Procedure: CYSTOSCOPY, URETEROSCOPY/RETROGRADE/STENT PLACEMENT;  Surgeon: Nickie Retort, MD;  Location: [facility];  Service: Urology;  Laterality: Left;  NEEDS DIGITAL URETEROSCOPE  . CYSTOSCOPY/URETEROSCOPY/HOLMIUM LASER/STENT PLACEMENT Left 11/12/2017   Procedure: CYSTOSCOPY LEFT RETROGRADE PYELOGRAM LEFT URETEROSCOPY HOLMIUM LASER AND LEFT URETERAL STENT EXCHANGE AND LASER ENDO URETEROTOMY;  Surgeon: Ardis Hughs, MD;  Location: [facility];  Service: Urology;  Laterality: Left;  . ESOPHAGOGASTRODUODENOSCOPY  multiple  . ESOPHAGOGASTRODUODENOSCOPY N/A 12/16/2017    Procedure: ESOPHAGOGASTRODUODENOSCOPY (EGD);  Surgeon: Mauri Pole, MD;  Location: [facility] ENDOSCOPY;  Service: Endoscopy;  Laterality: N/A;     OB History   None      Home Medications    Prior to Admission medications   Medication Sig Start Date End Date Taking? Authorizing Provider  alum & mag hydroxide-simeth (MAALOX PLUS) 400-400-40 MG/5ML suspension Take 15 mLs by mouth every 6 (six) hours as needed for indigestion.   Yes [provider]  apixaban (ELIQUIS) 5 MG TABS tablet Take 1 tablet (5 mg total) by mouth 2 (two) times daily. 12/24/17  Yes Roxan Hockey, MD  Cholecalciferol (VITAMIN D3) 50000 units CAPS Take 50,000 Units by mouth every Monday.   Yes [provider]  feeding supplement (ENSURE CLINICAL STRENGTH) LIQD Take 237 mLs by mouth 2 (two) times daily between meals.   Yes [provider]  folic acid (FOLVITE) 1 MG tablet Take 1 tablet (1 mg total) by mouth daily. 12/25/17  Yes Emokpae, Courage, MD  lactulose (CHRONULAC) 10 GM/15ML solution Take 30 g by mouth daily.   Yes [provider]  levothyroxine (SYNTHROID, LEVOTHROID) 200 MCG tablet Take 200 mcg by mouth daily before breakfast.   Yes [provider]  nystatin (MYCOSTATIN) 100000 UNIT/ML suspension Take 5 mLs by mouth 4 (four) times daily.   Yes [provider]  ondansetron (ZOFRAN) 4 MG tablet Take 1 tablet (4 mg total) by mouth every 6 (six) hours as needed for nausea. 12/24/17  Yes Roxan Hockey, MD  saccharomyces boulardii (FLORASTOR) 250 MG capsule Take 1 capsule (250 mg total) by mouth 2 (two) times daily. 12/24/17  Yes Roxan Hockey, MD  spironolactone (ALDACTONE) 25 MG tablet Take 1 tablet (25 mg total) by mouth 2 (two) times daily. 12/24/17  Yes Emokpae, Courage, MD  furosemide (LASIX) 40 MG tablet Take 1 tablet (40 mg total) by mouth 2 (two) times daily. Patient not taking: Reported on 01/17/2018 12/24/17   Roxan Hockey, MD  levothyroxine  (SYNTHROID) 150 MCG tablet Take 1 tablet (150 mcg total) by mouth daily. Patient not taking: Reported on 01/17/2018 12/24/17 12/24/18  Roxan Hockey, MD  nitrofurantoin (MACRODANTIN) 100 MG capsule Take 100 mg by mouth 2 (two) times daily.    [provider]  propranolol (INDERAL) 20 MG tablet Take 1 tablet (20 mg total) by mouth 2 (two) times daily. Patient not taking: Reported on 01/17/2018 12/24/17   Roxan Hockey, MD    Family History Family History  Problem Relation Age of Onset  . Heart failure Mother        died from  . Hypertension Mother   . Heart attack Father        died from  . Hypertension Sister     Social History Social History   Tobacco Use  . Smoking status: Never Smoker  . Smokeless tobacco: Never Used  Substance Use Topics  . Alcohol use: No  . Drug use: No     Allergies   Amoxicillin; Ciprofloxacin; Sulfonamide derivatives; Robaxin [methocarbamol]; and Verapamil   Review of Systems Review of Systems  Constitutional: Negative for fever.  HENT: Negative  for congestion.   Eyes: Negative for redness.  Respiratory: Negative for shortness of breath.   Cardiovascular: Negative for chest pain.  Gastrointestinal: Positive for abdominal distention, abdominal pain, nausea and vomiting. Negative for blood in stool.  Genitourinary: Negative for dysuria.  Musculoskeletal: Negative for back pain.  Skin: Negative for rash.  Neurological: Negative for syncope and headaches.  Hematological: Bruises/bleeds easily.  Psychiatric/Behavioral: Negative for confusion.     Physical Exam Updated Vital Signs BP 105/87   Pulse (!) 144   Temp 97.6 F (36.4 C) (Oral)   Resp 19   Ht 1.651 m (5\' 5" )   Wt 95.7 kg   SpO2 97%   BMI 35.11 kg/m   Physical Exam  Constitutional: She is oriented to person, place, and time. She appears well-developed and well-nourished. She appears distressed.  HENT:  Head: Normocephalic and atraumatic.  Mucous membranes dry.    Eyes: Pupils are equal, round, and reactive to light. Conjunctivae and EOM are normal.  Neck: Neck supple.  Cardiovascular: Normal heart sounds.  Tachycardic irregular rate  Pulmonary/Chest: Effort normal and breath sounds normal. No respiratory distress. She has no wheezes. She has no rales.  Abdominal: Soft. Bowel sounds are normal. She exhibits distension. There is tenderness.  Mild to moderate epigastric tenderness.  Some mild distention.  Decreased bowel sounds.  Musculoskeletal: Normal range of motion. She exhibits edema.  Mild to moderate bilateral lower extremity edema no erythema.  Neurological: She is alert and oriented to person, place, and time. No cranial nerve deficit.  Skin: Skin is warm.  Nursing note and vitals reviewed.    ED Treatments / Results  Labs (all labs ordered are listed, but only abnormal results are displayed) Labs Reviewed  COMPREHENSIVE METABOLIC PANEL - Abnormal; Notable for the following components:      Result Value   Sodium 134 (*)    Potassium 5.3 (*)    BUN 78 (*)    Creatinine, Ser 4.00 (*)    Total Protein 5.9 (*)    Albumin 2.1 (*)    AST 68 (*)    Total Bilirubin 2.0 (*)    GFR calc non Af Amer 10 (*)    GFR calc Af Amer 12 (*)    All other components within normal limits  CBC - Abnormal; Notable for the following components:   WBC 11.2 (*)    RBC 3.71 (*)    Hemoglobin 11.0 (*)    HCT 35.4 (*)    RDW 19.5 (*)    Platelets 619 (*)    All other components within normal limits  LIPASE, BLOOD - Abnormal; Notable for the following components:   Lipase 80 (*)    All other components within normal limits  I-STAT CHEM 8, ED - Abnormal; Notable for the following components:   Sodium 134 (*)    Potassium 5.2 (*)    BUN 73 (*)    Creatinine, Ser 4.30 (*)    Hemoglobin 11.6 (*)    HCT 34.0 (*)    All other components within normal limits  TYPE AND SCREEN    EKG EKG Interpretation  Date/Time:  Sunday January 17 2018 14:39:02  EDT Ventricular Rate:  145 PR Interval:    QRS Duration: 138 QT Interval:  357 QTC Calculation: 555 R Axis:   -32 Text Interpretation:  Sinus tachycardia with irregular rate Left bundle branch block Confirmed by Fredia Sorrow [phone]) on 01/17/2018 2:55:31 PM   Radiology Dg Chest [facility] 1 [address]  Result Date: 01/17/2018 CLINICAL DATA:  Per ED notes, pt brought in for coffee ground emesis since this am. Pt has hx of afib- is on eliquis, pt also has hx of esophageal varices. Pt endorses SOB with exertion. Per EMS pt's abdomen is rigid d/t subcutaneous fluid admin at facility. Pt is not eating, states even the sight of food makes her nauseous. Pt denies any pain. Hx of HTN. Non-smoker EXAM: PORTABLE CHEST 1 VIEW COMPARISON:  12/13/2017 FINDINGS: Cardiac silhouette is mildly enlarged. No mediastinal or hilar masses. There are prominent bronchovascular markings. There is hazy opacity in the lung bases consistent with small pleural effusions. No convincing pneumonia. No pneumothorax. Skeletal structures are grossly intact. IMPRESSION: 1. Mild cardiomegaly with small pleural effusions and prominent bronchovascular markings. The findings are similar to the prior study. Mild congestive heart failure is suspected given the symptoms of shortness of breath. Electronically Signed   By: Lajean Manes M.D.   On: 01/17/2018 15:29    Procedures Procedures (including critical care time)  CRITICAL CARE Performed by: Fredia Sorrow Total critical care time: 45 minutes Critical care time was exclusive of separately billable procedures and treating other patients. Critical care was necessary to treat or prevent imminent or life-threatening deterioration. Critical care was time spent personally by me on the following activities: development of treatment plan with patient and/or surrogate as well as nursing, discussions with consultants, evaluation of patient's response to treatment, examination of patient, obtaining  history from patient or surrogate, ordering and performing treatments and interventions, ordering and review of laboratory studies, ordering and review of radiographic studies, pulse oximetry and re-evaluation of patient's condition.   Medications Ordered in ED Medications  0.9 %  sodium chloride infusion ( Intravenous New Bag/Given 01/17/18 1500)  famotidine (PEPCID) IVPB 20 mg premix (0 mg Intravenous Stopped 01/17/18 1603)  sodium chloride 0.9 % bolus 1,000 mL (has no administration in time range)  sodium chloride 0.9 % bolus 1,000 mL (0 mLs Intravenous Stopped 01/17/18 1603)  calcium gluconate 1 g in sodium chloride 0.9 % 100 mL IVPB (0 g Intravenous Stopped 01/17/18 1603)     Initial Impression / Assessment and Plan / ED Course  I have reviewed the triage vital signs and the nursing notes.  Pertinent labs & imaging results that were available during my care of the patient were reviewed by me and considered in my medical decision making (see chart for details).    Patient presenting with several concerning factors.  Patient from nursing home not eating or drinking well for the past several days.  Patient was receiving subcutaneous fluids.  Patient today had some epigastric abdominal pain and vomited some coffee-ground emesis.  Patient is on the blood thinner Eliquis for atrial fibrillation.  And patient is also on Inderal for this.  Patient had recent admission in July from July 7 to July 18 discharged to collapse nursing facility.  Patient's admission in July was for urinary tract infection without hematuria.  Patient upon arrival here had an EKG with left bundle branch block atrial fibrillation with heart rate around 145.  Blood pressures were marginal at around 90 systolic or a little below.  Patient was awake and alert and talking only complaint was a burning sensation in her upper abdomen.  Patient denied any blood in her bowel movements.  Patient pretty much nonambulatory.  Patient is a  DNR.   I-STAT showed the patient had a markedly elevation in BUN and creatinine with a creatinine of 4 market  change from just the end of July when she was discharged.  This represented acute kidney injury but also could represent significant prerenal injury.  Based on this patient initially received to fluids type and screen was done i-STAT showed that her hemoglobin was stable for her at around 11.  Platelets were normal as well.  With the first liter of IV fluids patient's heart rate came down into the upper 120s 130s blood pressure came up to systolics around 294 or a little above.  Patient seemed to be responding from fluids.  Patient's initial chest x-ray did show some signs suggestive of some mild congestive heart failure.  But nothing significant in patient's room air oxygen saturations have been fine and 97-98%.  Patient's liver function test showed an elevated in her total bili and with the coffee-ground emesis in the epigastric tenderness we will do CT scan of the abdomen without contrast due to the acute kidney injury.  Patient will require admission just for the acute kidney injury also had some mild hyperkalemia with potassium of 5.2 patient received some calcium gluconate to protect the heart due to the persistent atrial fib.  Opted not to do any blocking of the atrial fib feel the patient needs fluids at this time.  Patient is followed by cardiology here locally and patient's primary care doctor is Dr. Ernestine Conrad at the nursing facility would be a [facility] admission once CT of abdomen is known.  Also consideration for cardiology consult regarding the rapid A. fib if it does not improve further with the IV fluids.    Final Clinical Impressions(s) / ED Diagnoses   Final diagnoses:  Gastrointestinal hemorrhage, unspecified gastrointestinal hemorrhage type  AKI (acute kidney injury) ([place])  Hyperkalemia  Atrial fibrillation with RVR ([place])  Epigastric abdominal pain    ED  Discharge Orders    None       Fredia Sorrow, MD 01/17/18 1630

## 2018-01-17 NOTE — H&P (Signed)
History and Physical   RAMEEN GOHLKE HCW:[id number] DOB: May 28, 1941 DOA: 01/17/2018  PCP: Unk Pinto, MD  Chief Complaint: emesis  HPI: this is a 77 year old woman with medical problems including cirrhosis, heart failure with preserved ejection fraction, age of fibrillation, poor embolus in June 2019, hypothyroidism who was admitted in July 2019 treated for multiple conditions including a UTI, bacteremia, C. Difficile colitis and is discharged on anticoagulation in addition to a diuretic regimen. For this July admission she was living independently, she is discharged to a skilled nursing facility to optimize functional status.  Patient reports she had an episode of emesis, staff there thought it was consistent with coffee grounds concern for GI bleed. The patient reports over the past one to days having burning in her chest, indigestion. She also reports receiving diuretics and regular basis. She reports having lower blood pressures with systolic blood pressure ranging from 75 and 90 millimeters mercury, she reports lightheadedness upon standing. She denies any diarrhea, fevers, chills, shortness of breath, chest pain.  There is report that at the skilled nursing facility, subcutaneous hydration was attempted.  She reports walking the day prior to admission with a forward walker, currently is needing assistance with her ADLs. She reports mild hard of hearing. She enjoys Scientific laboratory technician mystery books.  ED Course: vital signs remarkable for heart rate up to 140, respiratory rate 24, systolic blood pressure nadir of 89. Labs remarkable for CMP with potassium of 5.3, BUNs 78, creatinine 4.  CBC with white count 11.2, white count 6.9, hemoglobin 11.  Chest x-ray revealed findings consistent with prior study. CT abdomen and pelvis revealed changes consistent with cirrhosis, no other acute findings.  Patient is given 2 L of isotonic fluid, calcium gluconate, and IV H2 blocker. His initiated on a  diltiazem drip after bolus did not rate control her rapid ventricular rate. Hospital medicine consult further management.  Review of Systems: A complete ROS was obtained; also positive for palpitations, otherwise pertinent positives negatives are denoted in the HPI. Otherwise, all systems are negative.   Past Medical History:  Diagnosis Date  . Acute UTI   . Anemia   . Blood transfusion without reported diagnosis   . Cirrhosis ([place])   . Degenerative disk disease    knees  . Diverticulosis   . Dysrhythmia   . GERD (gastroesophageal reflux disease)   . Hemorrhoids   . History of kidney stones   . Hypertension   . Hypothyroidism   . Obesity   . Paroxysmal atrial fibrillation (HCC)   . Partial small bowel obstruction ([place])   . Personal history of colonic polyps 03/25/2012   8 mm rectal adenoma 03/2012  . Portal hypertensive gastropathy ([place])   . Shingles   . Thyroid disease   . UTI (urinary tract infection) 12/13/2017  . Varices, esophageal ([place])   . Zoster    Social History   Socioeconomic History  . Marital status: Single    Spouse name: Not on file  . Number of children: Not on file  . Years of education: Not on file  . Highest education level: Not on file  Occupational History  . Not on file  Social Needs  . Financial resource strain: Not on file  . Food insecurity:    Worry: Not on file    Inability: Not on file  . Transportation needs:    Medical: Not on file    Non-medical: Not on file  Tobacco Use  . Smoking status: Never Smoker  .  Smokeless tobacco: Never Used  Substance and Sexual Activity  . Alcohol use: No  . Drug use: No  . Sexual activity: Not Currently    Partners: Male  Lifestyle  . Physical activity:    Days per week: Not on file    Minutes per session: Not on file  . Stress: Not on file  Relationships  . Social connections:    Talks on phone: Not on file    Gets together: Not on file    Attends religious service: Not on file    Active  member of club or organization: Not on file    Attends meetings of clubs or organizations: Not on file    Relationship status: Not on file  . Intimate partner violence:    Fear of current or ex partner: Not on file    Emotionally abused: Not on file    Physically abused: Not on file    Forced sexual activity: Not on file  Other Topics Concern  . Not on file  Social History Narrative  . Not on file   Family History  Problem Relation Age of Onset  . Heart failure Mother        died from  . Hypertension Mother   . Heart attack Father        died from  . Hypertension Sister     Physical Exam: Vitals:   01/17/18 1831 01/17/18 1900 01/17/18 2000 01/17/18 2031  BP: 112/64 96/69 104/66 126/72  Pulse: (!) 131 (!) 118 (!) 132 (!) 131  Resp: 19 16 18 19   Temp:      TempSrc:      SpO2: 98% 99% 97% 97%  Weight:      Height:       General: Appears calm and comfortable, obese white woman ENT: Hard of hearing, mildly dry mucus membranes Cardiovascular: Tachycardic, irregular. No M/R/G. 1-2+ LE edema bilaterally Respiratory: CTA bilaterally. No wheezes or crackles. Normal respiratory effort.  Breathing room air. Abdomen: Soft, non-tender. Prior well healed surgical scar present.  Skin: No rash or induration seen on limited exam. Musculoskeletal: Grossly normal tone BUE/BLE. Appropriate ROM.  Psychiatric: Grossly normal mood and affect. Neurologic: Moves all extremities in coordinated fashion.  I have personally reviewed the following labs, culture data, and imaging studies.  Assessment/Plan:  #Acute kidney injury with hyperkalemia Course: Was discharged from prior admission on furosemide and spironolactone.  Patient reports symptoms of decreased intake, light-headedness, and hypotension prior to admission. Trial of subcutaneous hydration at SNF per ED team report. On admission - Cr of 4, BUN 78.   A/P: Intravascular volume status is challenging to determine, as clinically she has  dependent LE edema in setting of low albumin related to hier liver disease; not currently complaining of SOB and is comfortable laying flat, in conjunction with lactic acidosis and hypotension, favor that AKI is related to ineffective renal perfusion, likely dehydration. Therefore will pursue the following: -Monitor with telemetry, 1 dose of Kayexylate, repeat potassium in 4 hours, if not improved - consult nephrology -Urine studies, place foley catheter for accurate I&O -Low potassium diet -Repeat EKG -Continue hydration with sterile water + 150 meq sodium bicarb at 100 cc q hr x 10 hours and then re-assess  #Other problems: -Concern for coffee ground emesis: hb of 11.6, continue IV H2 blockade -Hx of PE/DVT: in 11/2017 - was on DOAC prior to admission, hold AC until upper GI bleed ruled out - Lactic acidosis: suspect related to hypotension and  in some part, cirrhosis; trend with AM labs -AF RVR: suspect underlying AF based on hx and tachycardia, continue dilt gtt that was initiated by emergency medicine team -Heart failure with preserved EF: hold diuresis (furosemide and spironolactone) given AKI -Hypothyroidism: update TSH -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

## 2018-01-17 NOTE — ED Notes (Signed)
Attempted to call report, RN request a call back in 5 minutes

## 2018-01-18 ENCOUNTER — Encounter (HOSPITAL_COMMUNITY): Payer: Self-pay

## 2018-01-18 DIAGNOSIS — Z7901 Long term (current) use of anticoagulants: Secondary | ICD-10-CM

## 2018-01-18 DIAGNOSIS — K920 Hematemesis: Secondary | ICD-10-CM

## 2018-01-18 LAB — BLOOD CULTURE ID PANEL (REFLEXED)
ACINETOBACTER BAUMANNII: NOT DETECTED
CANDIDA PARAPSILOSIS: NOT DETECTED
Candida albicans: NOT DETECTED
Candida glabrata: NOT DETECTED
Candida krusei: NOT DETECTED
Candida tropicalis: NOT DETECTED
ENTEROCOCCUS SPECIES: NOT DETECTED
Enterobacter cloacae complex: NOT DETECTED
Enterobacteriaceae species: NOT DETECTED
Escherichia coli: NOT DETECTED
HAEMOPHILUS INFLUENZAE: NOT DETECTED
KLEBSIELLA OXYTOCA: NOT DETECTED
Klebsiella pneumoniae: NOT DETECTED
Listeria monocytogenes: NOT DETECTED
METHICILLIN RESISTANCE: DETECTED — AB
Neisseria meningitidis: NOT DETECTED
PSEUDOMONAS AERUGINOSA: NOT DETECTED
Proteus species: NOT DETECTED
SERRATIA MARCESCENS: NOT DETECTED
STAPHYLOCOCCUS AUREUS BCID: NOT DETECTED
STREPTOCOCCUS PNEUMONIAE: NOT DETECTED
Staphylococcus species: DETECTED — AB
Streptococcus agalactiae: NOT DETECTED
Streptococcus pyogenes: NOT DETECTED
Streptococcus species: NOT DETECTED

## 2018-01-18 LAB — URINALYSIS, ROUTINE W REFLEX MICROSCOPIC
BILIRUBIN URINE: NEGATIVE
Glucose, UA: NEGATIVE mg/dL
Ketones, ur: NEGATIVE mg/dL
NITRITE: NEGATIVE
PH: 5 (ref 5.0–8.0)
Protein, ur: NEGATIVE mg/dL
RBC / HPF: >50 RBC/hpf — ABNORMAL HIGH (ref 0–5)
SPECIFIC GRAVITY, URINE: 1.014 (ref 1.005–1.030)

## 2018-01-18 LAB — SODIUM, URINE, RANDOM: Sodium, Ur: <10 mmol/L

## 2018-01-18 LAB — POTASSIUM
POTASSIUM: 5.4 mmol/L — AB (ref 3.5–5.1)
POTASSIUM: 4.7 mmol/L (ref 3.5–5.1)
Potassium: 5.5 mmol/L — ABNORMAL HIGH (ref 3.5–5.1)

## 2018-01-18 LAB — COMPREHENSIVE METABOLIC PANEL
ALBUMIN: 1.9 g/dL — AB (ref 3.5–5.0)
ALK PHOS: 75 U/L (ref 38–126)
ALT: 33 U/L (ref 0–44)
AST: 58 U/L — AB (ref 15–41)
Anion gap: 13 (ref 5–15)
BILIRUBIN TOTAL: 2.3 mg/dL — AB (ref 0.3–1.2)
BUN: 83 mg/dL — AB (ref 8–23)
CALCIUM: 9.7 mg/dL (ref 8.9–10.3)
CO2: 20 mmol/L — ABNORMAL LOW (ref 22–32)
Chloride: 104 mmol/L (ref 98–111)
Creatinine, Ser: 3.87 mg/dL — ABNORMAL HIGH (ref 0.44–1.00)
GFR calc Af Amer: 12 mL/min — ABNORMAL LOW (ref >60)
GFR calc non Af Amer: 11 mL/min — ABNORMAL LOW (ref >60)
GLUCOSE: 81 mg/dL (ref 70–99)
Potassium: 5.2 mmol/L — ABNORMAL HIGH (ref 3.5–5.1)
Sodium: 137 mmol/L (ref 135–145)
TOTAL PROTEIN: 5.2 g/dL — AB (ref 6.5–8.1)

## 2018-01-18 LAB — CBC
HCT: 30.4 % — ABNORMAL LOW (ref 36.0–46.0)
Hemoglobin: 9.9 g/dL — ABNORMAL LOW (ref 12.0–15.0)
MCH: 30.5 pg (ref 26.0–34.0)
MCHC: 32.6 g/dL (ref 30.0–36.0)
MCV: 93.5 fL (ref 78.0–100.0)
Platelets: 619 10*3/uL — ABNORMAL HIGH (ref 150–400)
RBC: 3.25 MIL/uL — ABNORMAL LOW (ref 3.87–5.11)
RDW: 19.2 % — ABNORMAL HIGH (ref 11.5–15.5)
WBC: 12.2 10*3/uL — ABNORMAL HIGH (ref 4.0–10.5)

## 2018-01-18 LAB — BASIC METABOLIC PANEL
ANION GAP: 12 (ref 5–15)
BUN: 84 mg/dL — ABNORMAL HIGH (ref 8–23)
CALCIUM: 9.1 mg/dL (ref 8.9–10.3)
CHLORIDE: 102 mmol/L (ref 98–111)
CO2: 19 mmol/L — AB (ref 22–32)
Creatinine, Ser: 3.79 mg/dL — ABNORMAL HIGH (ref 0.44–1.00)
GFR calc Af Amer: 13 mL/min — ABNORMAL LOW (ref >60)
GFR calc non Af Amer: 11 mL/min — ABNORMAL LOW (ref >60)
Glucose, Bld: 86 mg/dL (ref 70–99)
POTASSIUM: 5.2 mmol/L — AB (ref 3.5–5.1)
Sodium: 133 mmol/L — ABNORMAL LOW (ref 135–145)

## 2018-01-18 LAB — CREATININE, URINE, RANDOM: Creatinine, Urine: 134.55 mg/dL

## 2018-01-18 LAB — OSMOLALITY, URINE: Osmolality, Ur: 399 mOsm/kg (ref 300–900)

## 2018-01-18 LAB — LACTIC ACID, PLASMA
Lactic Acid, Venous: 2.8 mmol/L (ref 0.5–1.9)
Lactic Acid, Venous: 2.9 mmol/L (ref 0.5–1.9)
Lactic Acid, Venous: 2.4 mmol/L (ref 0.5–1.9)

## 2018-01-18 LAB — HEMOGLOBIN AND HEMATOCRIT, BLOOD
HEMATOCRIT: 33.5 % — AB (ref 36.0–46.0)
HEMOGLOBIN: 11.2 g/dL — AB (ref 12.0–15.0)

## 2018-01-18 LAB — OCCULT BLOOD X 1 CARD TO LAB, STOOL: Fecal Occult Bld: POSITIVE — AB

## 2018-01-18 LAB — TSH: TSH: 42.291 u[IU]/mL — ABNORMAL HIGH (ref 0.350–4.500)

## 2018-01-18 LAB — MRSA PCR SCREENING: MRSA BY PCR: NEGATIVE

## 2018-01-18 LAB — T4, FREE: Free T4: 0.85 ng/dL (ref 0.82–1.77)

## 2018-01-18 MED ORDER — SODIUM CHLORIDE 0.9 % IV SOLN
1.0000 g | Freq: Once | INTRAVENOUS | Status: AC
  Administered 2018-01-18: 1 g via INTRAVENOUS
  Filled 2018-01-18: qty 10

## 2018-01-18 MED ORDER — PANTOPRAZOLE SODIUM 40 MG IV SOLR
40.0000 mg | Freq: Two times a day (BID) | INTRAVENOUS | Status: DC
  Administered 2018-01-18 – 2018-01-21 (×5): 40 mg via INTRAVENOUS
  Filled 2018-01-18 (×6): qty 40

## 2018-01-18 MED ORDER — ALBUMIN HUMAN 25 % IV SOLN: 75.0000 g | Freq: Once | INTRAVENOUS | Status: DC

## 2018-01-18 MED ORDER — SODIUM POLYSTYRENE SULFONATE 15 GM/60ML PO SUSP
15.0000 g | Freq: Once | ORAL | Status: AC
  Administered 2018-01-18: 15 g via ORAL
  Filled 2018-01-18: qty 60

## 2018-01-18 MED ORDER — ALBUMIN HUMAN 25 % IV SOLN
100.0000 g | Freq: Every day | INTRAVENOUS | Status: AC
  Administered 2018-01-18 – 2018-01-19 (×2): 100 g via INTRAVENOUS
  Filled 2018-01-18 (×3): qty 400

## 2018-01-18 MED ORDER — SODIUM CHLORIDE 0.9 % IV SOLN
50.0000 ug/h | INTRAVENOUS | Status: DC
  Administered 2018-01-18 – 2018-01-20 (×4): 50 ug/h via INTRAVENOUS
  Filled 2018-01-18 (×6): qty 1

## 2018-01-18 MED ORDER — STERILE WATER FOR INJECTION IV SOLN
INTRAVENOUS | Status: DC
  Administered 2018-01-18: 17:00:00 via INTRAVENOUS
  Filled 2018-01-18: qty 850

## 2018-01-18 MED ORDER — SODIUM CHLORIDE 0.9 % IV BOLUS
500.0000 mL | Freq: Once | INTRAVENOUS | Status: AC
  Administered 2018-01-18: 500 mL via INTRAVENOUS

## 2018-01-18 MED ORDER — MIDODRINE HCL 5 MG PO TABS
10.0000 mg | ORAL_TABLET | Freq: Three times a day (TID) | ORAL | Status: DC
  Administered 2018-01-18 – 2018-01-19 (×4): 10 mg via ORAL
  Filled 2018-01-18 (×6): qty 2

## 2018-01-18 MED ORDER — SODIUM BICARBONATE 8.4 % IV SOLN
INTRAVENOUS | Status: AC
  Administered 2018-01-18: 02:00:00 via INTRAVENOUS
  Filled 2018-01-18: qty 850

## 2018-01-18 NOTE — Consult Note (Addendum)
Consultation  Referring Provider: [facility]/Dr Florene Glen Primary Care Physician:  Unk Pinto, MD Primary Gastroenterologist:  Silvano Rusk, MD  Reason for Consultation: Coffee-ground emesis  HPI: Brittney Valentine is a 77 y.o. female known to Dr. Carlean Purl, with complicated recent past medical history.  Patient has history of decompensated cirrhosis of unclear etiology however [name] suspected.  Also with congestive heart failure with preserved EF, atrial fibrillation, and DVT/bilateral PEs in June 2019.  She has been on Eliquis. Patient had hospitalization in July 2019 with UTI/bacteremia/sepsis injury to Klebsiella.  She was also diagnosed with C. difficile colitis and treated.  She was transferred to a skilled nursing facility or rehab due to debilitation. She did undergo EGD during that admission on 12/16/2017 per Dr. Silverio Decamp - that showed grade 2 esophageal varices, no stigmata of bleeding and mild portal gastropathy. Patient was readmitted last night after she had an episode of emesis which appeared to be coffee-ground material.  She also has been having dark stools but not clear at present for how many days.  Nurses stated that she is having very dark but not grossly melenic stools today.  Eliquis is currently on hold. Also with acute kidney injury on admission with creatinine of 4, hypotension, and A. fib with rapid ventricular rate.  She is currently on diltiazem drip and heart rate is improved.   She says she feels better today, she had been short of breath and lightheaded on admission. She denies any nausea today no further vomiting no complaints of abdominal pain.  She had complained of heartburn on admission.  She does feel very full in her abdomen, and says that she has had an increase in lower extremity edema over the past couple of weeks.  She says the right leg had improved but the left leg never did and now both are more swollen.  CT of the abdomen and pelvis on admission  consistent with cirrhosis moderate ascites, changes of anasarca noted and small bilateral effusions  Hemoglobin on discharge 12/24/2017 was 11.3 on admission last evening hemoglobin 11 hematocrit of 35.4, today hemoglobin down to 9.9  Creatinine 4 on admission urine sodium less than 10-she has had improvement with creatinine of 3.87 today She did receive 2 L of fluids in the ER.  She remains on Lasix.  I believe she was on Lasix 40 twice daily and Aldactone 25 twice daily at the nursing home.  Creatinine baseline is around 1 creatinine was 1.28 on discharge 12/24/2017.   Past Medical History:  Diagnosis Date  . Acute UTI   . Anemia   . Blood transfusion without reported diagnosis   . Cirrhosis ([place])   . Degenerative disk disease    knees  . Diverticulosis   . Dysrhythmia   . GERD (gastroesophageal reflux disease)   . Hemorrhoids   . History of kidney stones   . Hypertension   . Hypothyroidism   . Obesity   . Paroxysmal atrial fibrillation ([facility])   . Partial small bowel obstruction ([place])   . Personal history of colonic polyps 03/25/2012   8 mm rectal adenoma 03/2012  . Portal hypertensive gastropathy ([place])   . Shingles   . Thyroid disease   . UTI (urinary tract infection) 12/13/2017  . Varices, esophageal ([place])   . Zoster     Past Surgical History:  Procedure Laterality Date  . ABDOMINAL HYSTERECTOMY    . APPENDECTOMY  1991  . CHOLECYSTECTOMY  2000  . COLON SURGERY    .  COLONOSCOPY    . CYSTOSCOPY     10-26-17 budyzn  . CYSTOSCOPY/URETEROSCOPY/HOLMIUM LASER/STENT PLACEMENT Left 10/26/2017   Procedure: CYSTOSCOPY, URETEROSCOPY/RETROGRADE/STENT PLACEMENT;  Surgeon: Nickie Retort, MD;  Location: [facility];  Service: Urology;  Laterality: Left;  NEEDS DIGITAL URETEROSCOPE  . CYSTOSCOPY/URETEROSCOPY/HOLMIUM LASER/STENT PLACEMENT Left 11/12/2017   Procedure: CYSTOSCOPY LEFT RETROGRADE PYELOGRAM LEFT URETEROSCOPY HOLMIUM LASER AND LEFT URETERAL STENT EXCHANGE AND LASER ENDO  URETEROTOMY;  Surgeon: Ardis Hughs, MD;  Location: [facility];  Service: Urology;  Laterality: Left;  . ESOPHAGOGASTRODUODENOSCOPY  multiple  . ESOPHAGOGASTRODUODENOSCOPY N/A 12/16/2017   Procedure: ESOPHAGOGASTRODUODENOSCOPY (EGD);  Surgeon: Mauri Pole, MD;  Location: [facility] ENDOSCOPY;  Service: Endoscopy;  Laterality: N/A;    Prior to Admission medications   Medication Sig Start Date End Date Taking? Authorizing Provider  alum & mag hydroxide-simeth (MAALOX PLUS) 400-400-40 MG/5ML suspension Take 15 mLs by mouth every 6 (six) hours as needed for indigestion.   Yes [provider]  apixaban (ELIQUIS) 5 MG TABS tablet Take 1 tablet (5 mg total) by mouth 2 (two) times daily. 12/24/17  Yes Roxan Hockey, MD  Cholecalciferol (VITAMIN D3) 50000 units CAPS Take 50,000 Units by mouth every Monday.   Yes [provider]  feeding supplement (ENSURE CLINICAL STRENGTH) LIQD Take 237 mLs by mouth 2 (two) times daily between meals.   Yes [provider]  folic acid (FOLVITE) 1 MG tablet Take 1 tablet (1 mg total) by mouth daily. 12/25/17  Yes Emokpae, Courage, MD  lactulose (CHRONULAC) 10 GM/15ML solution Take 30 g by mouth daily.   Yes [provider]  levothyroxine (SYNTHROID, LEVOTHROID) 200 MCG tablet Take 200 mcg by mouth daily before breakfast.   Yes [provider]  nystatin (MYCOSTATIN) 100000 UNIT/ML suspension Take 5 mLs by mouth 4 (four) times daily.   Yes [provider]  ondansetron (ZOFRAN) 4 MG tablet Take 1 tablet (4 mg total) by mouth every 6 (six) hours as needed for nausea. 12/24/17  Yes Roxan Hockey, MD  saccharomyces boulardii (FLORASTOR) 250 MG capsule Take 1 capsule (250 mg total) by mouth 2 (two) times daily. 12/24/17  Yes Roxan Hockey, MD  spironolactone (ALDACTONE) 25 MG tablet Take 1 tablet (25 mg total) by mouth 2 (two) times daily. 12/24/17  Yes Emokpae, Courage, MD  furosemide (LASIX) 40 MG tablet Take 1 tablet  (40 mg total) by mouth 2 (two) times daily. Patient not taking: Reported on 01/17/2018 12/24/17   Roxan Hockey, MD  levothyroxine (SYNTHROID) 150 MCG tablet Take 1 tablet (150 mcg total) by mouth daily. Patient not taking: Reported on 01/17/2018 12/24/17 12/24/18  Roxan Hockey, MD  nitrofurantoin (MACRODANTIN) 100 MG capsule Take 100 mg by mouth 2 (two) times daily.    [provider]  propranolol (INDERAL) 20 MG tablet Take 1 tablet (20 mg total) by mouth 2 (two) times daily. Patient not taking: Reported on 01/17/2018 12/24/17   Roxan Hockey, MD    Current Facility-Administered Medications  Medication Dose Route Frequency Provider Last Rate Last Dose  . diltiazem (CARDIZEM) 100 mg in dextrose 5% 164mL (1 mg/mL) infusion  5-15 mg/hr Intravenous Continuous Vilma Prader, MD 5 mL/hr at 01/18/18 1352 5 mg/hr at 01/18/18 1352  . famotidine (PEPCID) IVPB 20 mg premix  20 mg Intravenous Q24H Vilma Prader, MD 100 mL/hr at 01/18/18 1053 20 mg at 01/18/18 1053  . levothyroxine (SYNTHROID, LEVOTHROID) tablet 200 mcg  200 mcg Oral QAC breakfast Vilma Prader, MD   200  mcg at 01/18/18 0738  . ondansetron (ZOFRAN-ODT) disintegrating tablet 4 mg  4 mg Oral Q6H PRN Vilma Prader, MD      . sodium bicarbonate 150 mEq in sterile water 1,000 mL infusion   Intravenous Continuous Elodia Florence., MD      . sodium chloride flush (NS) 0.9 % injection 3 mL  3 mL Intravenous Q12H Vilma Prader, MD        Allergies as of 01/17/2018 - Review Complete 01/17/2018  Allergen Reaction Noted  . Amoxicillin Palpitations 11/25/2017  . Ciprofloxacin Swelling 05/17/2013  . Sulfonamide derivatives Other (See Comments) 12/31/2007  . Robaxin [methocarbamol] Palpitations 02/09/2014  . Verapamil Palpitations 02/09/2014    Family History  Problem Relation Age of Onset  . Heart failure Mother        died from  . Hypertension Mother   . Heart attack Father        died from  .  Hypertension Sister     Social History   Socioeconomic History  . Marital status: Single    Spouse name: Not on file  . Number of children: Not on file  . Years of education: Not on file  . Highest education level: Not on file  Occupational History  . Not on file  Social Needs  . Financial resource strain: Not on file  . Food insecurity:    Worry: Not on file    Inability: Not on file  . Transportation needs:    Medical: Not on file    Non-medical: Not on file  Tobacco Use  . Smoking status: Never Smoker  . Smokeless tobacco: Never Used  Substance and Sexual Activity  . Alcohol use: No  . Drug use: No  . Sexual activity: Not Currently    Partners: Male  Lifestyle  . Physical activity:    Days per week: Not on file    Minutes per session: Not on file  . Stress: Not on file  Relationships  . Social connections:    Talks on phone: Not on file    Gets together: Not on file    Attends religious service: Not on file    Active member of club or organization: Not on file    Attends meetings of clubs or organizations: Not on file    Relationship status: Not on file  . Intimate partner violence:    Fear of current or ex partner: Not on file    Emotionally abused: Not on file    Physically abused: Not on file    Forced sexual activity: Not on file  Other Topics Concern  . Not on file  Social History Narrative  . Not on file    Review of Systems: Pertinent positive and negative review of systems were noted in the above HPI section.  All other review of systems was otherwise negative.  Physical Exam: Vital signs in last 24 hours: Temp:  [97.5 F (36.4 C)-98.1 F (36.7 C)] 98.1 F (36.7 C) (08/12 1300) Pulse Rate:  [82-144] 82 (08/12 1300) Resp:  [15-24] 15 (08/12 1300) BP: (89-126)/(38-87) 94/38 (08/12 1300) SpO2:  [96 %-100 %] 98 % (08/12 1300) Weight:  [95.7 kg-106.7 kg] 106.7 kg (08/11 2318) Last BM Date: 01/18/18 General:   Alert,  Well-developed,  chronically ill-appearing elderly white female in no acute distress Head:  Normocephalic and atraumatic. Eyes:  Sclera clear, no icterus.   Conjunctiva pink. Ears:  Normal auditory acuity. Nose:  No deformity, discharge,  or lesions. Mouth:  No deformity or lesions.   Neck:  Supple; no masses or thyromegaly. Lungs:  Clear throughout to auscultation.   Decreased breath sounds bilateral bases.  Heart: Irregular regular rate and rhythm; no murmurs, clicks, rubs,  or gallops. Abdomen:  Soft, no definite fluid wave but she has a large amount of soft tissue edema in the lower abdominal wall, bowel sounds are present no palpable mass or hepatosplenomegaly Rectal-not done nurseries ports stools dark and heme positive today Rectal:  Deferred  Msk:  Symmetrical without gross deformities. . Pulses:  Normal pulses noted. Extremities: 2+ pitting edema into the thighs bilaterally Neurologic:  Alert and  oriented x4;  grossly normal neurologically. Skin:  Intact without significant lesions or rashes.. Psych:  Alert and cooperative. Normal mood and affect.  Intake/Output from previous day: 08/11 0701 - 08/12 0700 In: 1935 [P.O.:220; I.V.:415; IV Piggyback:1300] Out: 100 [Urine:100] Intake/Output this shift: Total I/O In: 0  Out: 150 [Urine:150]  Lab Results: Recent Labs    01/17/18 1436 01/17/18 1456 01/18/18 0542  WBC 11.2*  --  12.2*  HGB 11.0* 11.6* 9.9*  HCT 35.4* 34.0* 30.4*  PLT 619*  --  619*   BMET Recent Labs    01/17/18 1436 01/17/18 1456 01/17/18 2211  01/18/18 0542 01/18/18 0921 01/18/18 1220  NA 134* 134* 135  --  137  --   --   K 5.3* 5.2* 5.6*   < > 5.2* 5.5* 4.7  CL 103 102 105  --  104  --   --   CO2 22  --  17*  --  20*  --   --   GLUCOSE 91 89 80  --  81  --   --   BUN 78* 73* 80*  --  83*  --   --   CREATININE 4.00* 4.30* 3.80*  --  3.87*  --   --   CALCIUM 9.9  --  9.7  --  9.7  --   --    < > = values in this interval not displayed.   LFT Recent Labs      01/18/18 0542  PROT 5.2*  ALBUMIN 1.9*  AST 58*  ALT 33  ALKPHOS 75  BILITOT 2.3*   PT/INR No results for input(s): LABPROT, INR in the last 72 hours. Hepatitis Panel No results for input(s): HEPBSAG, HCVAB, HEPAIGM, HEPBIGM in the last 72 hours.   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  This is in the setting of Eliquis for recent DVT/bilateral PEs. Suspect she is having slow bleeding secondary to recently documented portal gastropathy, bleeding is not consistent with variceal bleeding  #2 recent left DVT and bilateral PEs - Eliquis on hold since admission. We will need to determine if she is appropriate to go back on anticoagulation, versus placement of filter  #3 acute kidney injury - question secondary to diuretics, concerned she may have early hepatorenal syndrome. Diuretics have been held, will discuss starting IV albumin #4 ascites and significant anasarca #5 atrial fibrillation rapid RVR on admit - on diltiazem drip #6 CHF with preserved EF #7 history of C. difficile colitis July 2019 #8 Klebsiella bacteremia/sepsis secondary to UTI July 2019  Plan:  Serial hemoglobins, transfuse for hemoglobin less than 7 Hold Eliquis Currently on IV Pepcid, switch to IV PPI (I spoke  to pharmacy and they will switch) Will discuss adding octreotide, and IV albumin Think she will need EGD,  Eliquis will likely take 48 to 72 hours to washout given acute kidney injury. Hold Lasix Renal consult We will follow with you.  [name]  01/18/2018, 2:18 PM      Attending physician's note   I have taken a history, examined the patient and reviewed the chart. I agree with the Advanced Practitioner's note, impression and recommendations.  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  Pt on Eliquis for recent DVTs/PE.  AKI, R/O HRS.  IV PPI  IV octreotide  IV albumin Hold Eliquis Hold diuretics  Renal consult Trend CBC   Likely will need EGD after Eliquis wash out (2 days off Eliquis with renal dysfunction)   Lucio Edward, MD [facility] [phone] office

## 2018-01-18 NOTE — Consult Note (Signed)
Cardiology Consultation:   Patient ID: Brittney Valentine; [id number]; Nov 15, 1940   Admit date: 01/17/2018 Date of Consult: 01/18/2018  Primary Care Provider: Unk Pinto, MD Primary Cardiologist: Minus Breeding, MD  Primary Electrophysiologist:  Dr. Rayann Heman   Patient Profile:   Brittney Valentine is a 77 y.o. female with a hx of persistent atrial fibrillation, GI bleed, hypertension, hypothyroidism, cirrhosis, and hypothyroidism who is being seen today for the evaluation of persistent atrial fibrillation at the request of Dr. Florene Glen.  History of Present Illness:   Brittney Valentine is a 77 year old Caucasian female was past medical history of persistent atrial fibrillation, GI bleed, hypertension, hypothyroidism, cirrhosis, and history of hypothyroidism.  In November 2018, she had VT in the hospital with acute reduction in the ejection fraction.  Although she was on flecainide in the past, during this admission she was placed on amiodarone instead.  However amiodarone was later discontinued due to significant hypothyroidism.  Patient was seen by Dr. Rayann Heman will plan to start her on sotalol to maintain sinus rhythm.  However she was hesitant to return to the hospital for sotalol loading process.  Since early 2019, she also had recurrent UTI and hematuria.  She underwent kidney stone removal and ureteral stent placement in June.  She also was seen back in the emergency room around mid June for generalized weakness and was given IV fluid.  Afterward, she developed left lower extremity edema.  And found to have left lower extremity DVT and bilateral PE on CT of chest 11/26/2017.  Echocardiogram obtained in June showed a normalization of ejection fraction.  She was treated initially with IV heparin drip, this was later transitioned to Eliquis.  In July, she was admitted for urinary tract infection again.  Patient currently lives at collapsed nursing facility, she was readmitted on 01/17/2018 with concern of  coffee-ground emesis.  She also had a significant hypotension with systolic blood pressure ranging in the 70s to 90s.  On first of arrival, her creatinine has trended up from the previous 1.3 up to 4.0.  She also had mildly elevated lactic acidosis.  TSH was elevated at 42.3.  Potassium 5.5, she was given Kayexalate.  Due to significant renal dysfunction, her Lasix and spironolactone were discontinued.  She is currently on low-dose IV diltiazem for rate control.  Eliquis is also discontinued pending upcoming GI work-up.  Despite peripheral swelling, she appears to be intravascularly dry.  Her albumin was only 1.9.  Hemoglobin was 11.0 on arrival, trended down to 9.9 with hydration.  Cardiology has been consulted for management of atrial fibrillation.   Past Medical History:  Diagnosis Date  . Acute UTI   . Anemia   . Blood transfusion without reported diagnosis   . Cirrhosis ([place])   . Degenerative disk disease    knees  . Diverticulosis   . Dysrhythmia   . GERD (gastroesophageal reflux disease)   . Hemorrhoids   . History of kidney stones   . Hypertension   . Hypothyroidism   . Obesity   . Paroxysmal atrial fibrillation ([facility])   . Partial small bowel obstruction ([place])   . Personal history of colonic polyps 03/25/2012   8 mm rectal adenoma 03/2012  . Portal hypertensive gastropathy ([place])   . Shingles   . Thyroid disease   . UTI (urinary tract infection) 12/13/2017  . Varices, esophageal ([place])   . Zoster     Past Surgical History:  Procedure Laterality Date  . ABDOMINAL HYSTERECTOMY    .  APPENDECTOMY  1991  . CHOLECYSTECTOMY  2000  . COLON SURGERY    . COLONOSCOPY    . CYSTOSCOPY     10-26-17 budyzn  . CYSTOSCOPY/URETEROSCOPY/HOLMIUM LASER/STENT PLACEMENT Left 10/26/2017   Procedure: CYSTOSCOPY, URETEROSCOPY/RETROGRADE/STENT PLACEMENT;  Surgeon: Nickie Retort, MD;  Location: [facility];  Service: Urology;  Laterality: Left;  NEEDS DIGITAL URETEROSCOPE  .  CYSTOSCOPY/URETEROSCOPY/HOLMIUM LASER/STENT PLACEMENT Left 11/12/2017   Procedure: CYSTOSCOPY LEFT RETROGRADE PYELOGRAM LEFT URETEROSCOPY HOLMIUM LASER AND LEFT URETERAL STENT EXCHANGE AND LASER ENDO URETEROTOMY;  Surgeon: Ardis Hughs, MD;  Location: [facility];  Service: Urology;  Laterality: Left;  . ESOPHAGOGASTRODUODENOSCOPY  multiple  . ESOPHAGOGASTRODUODENOSCOPY N/A 12/16/2017   Procedure: ESOPHAGOGASTRODUODENOSCOPY (EGD);  Surgeon: Mauri Pole, MD;  Location: [facility] ENDOSCOPY;  Service: Endoscopy;  Laterality: N/A;     Home Medications:  Prior to Admission medications   Medication Sig Start Date End Date Taking? Authorizing Provider  alum & mag hydroxide-simeth (MAALOX PLUS) 400-400-40 MG/5ML suspension Take 15 mLs by mouth every 6 (six) hours as needed for indigestion.   Yes [provider]  apixaban (ELIQUIS) 5 MG TABS tablet Take 1 tablet (5 mg total) by mouth 2 (two) times daily. 12/24/17  Yes Roxan Hockey, MD  Cholecalciferol (VITAMIN D3) 50000 units CAPS Take 50,000 Units by mouth every Monday.   Yes [provider]  feeding supplement (ENSURE CLINICAL STRENGTH) LIQD Take 237 mLs by mouth 2 (two) times daily between meals.   Yes [provider]  folic acid (FOLVITE) 1 MG tablet Take 1 tablet (1 mg total) by mouth daily. 12/25/17  Yes Emokpae, Courage, MD  lactulose (CHRONULAC) 10 GM/15ML solution Take 30 g by mouth daily.   Yes [provider]  levothyroxine (SYNTHROID, LEVOTHROID) 200 MCG tablet Take 200 mcg by mouth daily before breakfast.   Yes [provider]  nystatin (MYCOSTATIN) 100000 UNIT/ML suspension Take 5 mLs by mouth 4 (four) times daily.   Yes [provider]  ondansetron (ZOFRAN) 4 MG tablet Take 1 tablet (4 mg total) by mouth every 6 (six) hours as needed for nausea. 12/24/17  Yes Roxan Hockey, MD  saccharomyces boulardii (FLORASTOR) 250 MG capsule Take 1 capsule (250 mg total) by mouth 2 (two) times  daily. 12/24/17  Yes Roxan Hockey, MD  spironolactone (ALDACTONE) 25 MG tablet Take 1 tablet (25 mg total) by mouth 2 (two) times daily. 12/24/17  Yes Emokpae, Courage, MD  furosemide (LASIX) 40 MG tablet Take 1 tablet (40 mg total) by mouth 2 (two) times daily. Patient not taking: Reported on 01/17/2018 12/24/17   Roxan Hockey, MD  levothyroxine (SYNTHROID) 150 MCG tablet Take 1 tablet (150 mcg total) by mouth daily. Patient not taking: Reported on 01/17/2018 12/24/17 12/24/18  Roxan Hockey, MD  nitrofurantoin (MACRODANTIN) 100 MG capsule Take 100 mg by mouth 2 (two) times daily.    [provider]  propranolol (INDERAL) 20 MG tablet Take 1 tablet (20 mg total) by mouth 2 (two) times daily. Patient not taking: Reported on 01/17/2018 12/24/17   Roxan Hockey, MD    Inpatient Medications: Scheduled Meds: . levothyroxine  200 mcg Oral QAC breakfast  . pantoprazole (PROTONIX) IV  40 mg Intravenous BID  . sodium chloride flush  3 mL Intravenous Q12H   Continuous Infusions: . diltiazem (CARDIZEM) infusion 5 mg/hr (01/18/18 1352)  .  sodium bicarbonate (isotonic) infusion in sterile water 100 mL/hr at 01/18/18 1650   PRN Meds: ondansetron  Allergies:    Allergies  Allergen Reactions  .  Amoxicillin Palpitations    Has patient had a PCN reaction causing immediate rash, facial/tongue/throat swelling, SOB or lightheadedness with hypotension: No Has patient had a PCN reaction causing severe rash involving mucus membranes or skin necrosis: No Has patient had a PCN reaction that required hospitalization: No Has patient had a PCN reaction occurring within the last 10 years: No If all of the above answers are "NO", then may proceed with Cephalosporin use.   . Ciprofloxacin Swelling  . Sulfonamide Derivatives Other (See Comments)    See little dots, skin feels like its burning  . Robaxin [Methocarbamol] Palpitations  . Verapamil Palpitations    Social History:   Social  History   Socioeconomic History  . Marital status: Single    Spouse name: Not on file  . Number of children: Not on file  . Years of education: Not on file  . Highest education level: Not on file  Occupational History  . Not on file  Social Needs  . Financial resource strain: Not on file  . Food insecurity:    Worry: Not on file    Inability: Not on file  . Transportation needs:    Medical: Not on file    Non-medical: Not on file  Tobacco Use  . Smoking status: Never Smoker  . Smokeless tobacco: Never Used  Substance and Sexual Activity  . Alcohol use: No  . Drug use: No  . Sexual activity: Not Currently    Partners: Male  Lifestyle  . Physical activity:    Days per week: Not on file    Minutes per session: Not on file  . Stress: Not on file  Relationships  . Social connections:    Talks on phone: Not on file    Gets together: Not on file    Attends religious service: Not on file    Active member of club or organization: Not on file    Attends meetings of clubs or organizations: Not on file    Relationship status: Not on file  . Intimate partner violence:    Fear of current or ex partner: Not on file    Emotionally abused: Not on file    Physically abused: Not on file    Forced sexual activity: Not on file  Other Topics Concern  . Not on file  Social History Narrative  . Not on file    Family History:    Family History  Problem Relation Age of Onset  . Heart failure Mother        died from  . Hypertension Mother   . Heart attack Father        died from  . Hypertension Sister      ROS:  Please see the history of present illness.   All other ROS reviewed and negative.     Physical Exam/Data:   Vitals:   01/18/18 1300 01/18/18 1400 01/18/18 1500 01/18/18 1600  BP: (!) 94/38 110/70  97/74  Pulse: 82 90 91 (!) 103  Resp: 15 16 17 17   Temp: 98.1 F (36.7 C)     TempSrc: Oral     SpO2: 98% 97% 98% 97%  Weight:      Height:        Intake/Output  Summary (Last 24 hours) at 01/18/2018 1650 Last data filed at 01/18/2018 1500 Gross per 24 hour  Intake 1849.17 ml  Output 250 ml  Net 1599.17 ml   Filed Weights   01/17/18 1429 01/17/18 2318  Weight: 95.7 kg 106.7 kg   Body mass index is 39.14 kg/m.  General:  Well nourished, well developed, in no acute distress HEENT: normal Lymph: no adenopathy Neck: no JVD Endocrine:  No thryomegaly Vascular: No carotid bruits; FA pulses 2+ bilaterally without bruits  Cardiac: Irregularly irregular; no murmur  Lungs:  Anterior exam clear to auscultation bilaterally, no wheezing, rhonchi or rales  Abd: soft, nontender, no hepatomegaly  Ext: no edema Musculoskeletal:  No deformities, BUE and BLE strength normal and equal Skin: warm and dry  Neuro:  CNs 2-12 intact, no focal abnormalities noted Psych:  Normal affect   EKG:  The EKG was personally reviewed and demonstrates: Atrial fibrillation with RVR Telemetry:  Telemetry was personally reviewed and demonstrates: Atrial fibrillation heart rate around 100  Relevant CV Studies:  Echo 12/06/2017 LV EF: 55% -   60% Study Conclusions  - Left ventricle: The cavity size was normal. Wall thickness was   increased in a pattern of mild LVH. Systolic function was normal.   The estimated ejection fraction was in the range of 55% to 60%.   Wall motion was normal; there were no regional wall motion   abnormalities. - Mitral valve: Calcified annulus. There was mild regurgitation. - Left atrium: The atrium was moderately dilated. - Right atrium: The atrium was moderately dilated. - Pulmonary arteries: Systolic pressure was mildly increased. PA   peak pressure: 34 mm Hg (S).  Impressions:  - Normal LV function; mild LVH; mild MR; moderate biatrial   enlargement; mild TR; mild pulmonary hypertension.   Laboratory Data:  Chemistry Recent Labs  Lab 01/17/18 1436 01/17/18 1456 01/17/18 2211  01/18/18 0542 01/18/18 0921 01/18/18 1220  NA  134* 134* 135  --  137  --   --   K 5.3* 5.2* 5.6*   < > 5.2* 5.5* 4.7  CL 103 102 105  --  104  --   --   CO2 22  --  17*  --  20*  --   --   GLUCOSE 91 89 80  --  81  --   --   BUN 78* 73* 80*  --  83*  --   --   CREATININE 4.00* 4.30* 3.80*  --  3.87*  --   --   CALCIUM 9.9  --  9.7  --  9.7  --   --   GFRNONAA 10*  --  11*  --  11*  --   --   GFRAA 12*  --  12*  --  12*  --   --   ANIONGAP 9  --  13  --  13  --   --    < > = values in this interval not displayed.    Recent Labs  Lab 01/17/18 1436 01/18/18 0542  PROT 5.9* 5.2*  ALBUMIN 2.1* 1.9*  AST 68* 58*  ALT 38 33  ALKPHOS 85 75  BILITOT 2.0* 2.3*   Hematology Recent Labs  Lab 01/17/18 1436 01/17/18 1456 01/18/18 0542  WBC 11.2*  --  12.2*  RBC 3.71*  --  3.25*  HGB 11.0* 11.6* 9.9*  HCT 35.4* 34.0* 30.4*  MCV 95.4  --  93.5  MCH 29.6  --  30.5  MCHC 31.1  --  32.6  RDW 19.5*  --  19.2*  PLT 619*  --  619*   Cardiac EnzymesNo results for input(s): TROPONINI in the last 168 hours. No results for input(s): TROPIPOC in  the last 168 hours.  BNPNo results for input(s): BNP, PROBNP in the last 168 hours.  DDimer No results for input(s): DDIMER in the last 168 hours.  Radiology/Studies:  Ct Abdomen Pelvis Wo Contrast  Result Date: 01/17/2018 CLINICAL DATA:  Coffee ground emesis with history of esophageal varices EXAM: CT ABDOMEN AND PELVIS WITHOUT CONTRAST TECHNIQUE: Multidetector CT imaging of the abdomen and pelvis was performed following the standard protocol without IV contrast. COMPARISON:  12/13/2017 FINDINGS: Lower chest: Bilateral pleural effusions are noted right considerably greater than left similar to that seen on the prior exam with underlying lower lobe atelectatic changes. Hepatobiliary: The liver is small and nodular in appearance consistent with underlying cirrhotic change. Stable hypodense lesions are noted in the posterior aspect of the right lobe of the liver as well as the lateral segment of the  left lobe of the liver. The gallbladder has been surgically removed. Pancreas: Unremarkable. No pancreatic ductal dilatation or surrounding inflammatory changes. Spleen: Normal in size without focal abnormality. The previously seen splenomegaly is not well appreciated on today's exam. Adrenals/Urinary Tract: The adrenal glands are within normal limits. Tiny nonobstructing stone is now seen in the lower pole of the left kidney. No obstructive changes are noted. Persistent distal left ureteral stone is noted stable from the previous exam. The bladder is decompressed. Stomach/Bowel: The appendix has been surgically removed. Mild retained fecal material is noted although no constipation is seen. Some wall thickening in the small bowel is noted likely related to the underlying ascites. Postsurgical changes in the right lower quadrant are noted. No definitive obstructive changes seen. Vascular/Lymphatic: Aortic atherosclerosis. No enlarged abdominal or pelvic lymph nodes. Reproductive: Status post hysterectomy. No adnexal masses. Other: Moderate ascites is noted slightly increased when compared with the prior exam. Changes of anasarca are noted. These have increased in the interval from the prior exam. Musculoskeletal: Degenerative changes of the lumbar spine are noted. IMPRESSION: Changes consistent with cirrhosis of the liver with stable hypodense lesions in both the right and left lobes as well as changes of ascites. The known esophageal varices are not well appreciated on this exam. Increasing changes of anasarca when compared with the prior study. Bilateral pleural effusions with associated lower lobe atelectasis/infiltrate. This is stable from the prior exam. Distal left ureteral stone which has migrated slightly in the interval from the prior exam. A tiny lower pole left renal stone is noted. Diffuse small bowel wall thickening likely related to the underlying ascites Electronically Signed   By: Inez Catalina M.D.    On: 01/17/2018 20:05   Dg Chest Port 1 View  Result Date: 01/17/2018 CLINICAL DATA:  Per ED notes, pt brought in for coffee ground emesis since this am. Pt has hx of afib- is on eliquis, pt also has hx of esophageal varices. Pt endorses SOB with exertion. Per EMS pt's abdomen is rigid d/t subcutaneous fluid admin at facility. Pt is not eating, states even the sight of food makes her nauseous. Pt denies any pain. Hx of HTN. Non-smoker EXAM: PORTABLE CHEST 1 VIEW COMPARISON:  12/13/2017 FINDINGS: Cardiac silhouette is mildly enlarged. No mediastinal or hilar masses. There are prominent bronchovascular markings. There is hazy opacity in the lung bases consistent with small pleural effusions. No convincing pneumonia. No pneumothorax. Skeletal structures are grossly intact. IMPRESSION: 1. Mild cardiomegaly with small pleural effusions and prominent bronchovascular markings. The findings are similar to the prior study. Mild congestive heart failure is suspected given the symptoms of shortness of breath.  Electronically Signed   By: Lajean Manes M.D.   On: 01/17/2018 15:29    Assessment and Plan:   1. Persistent atrial fibrillation: Continue rate control for now.  Off of Eliquis due to potential GI bleeding.  Once her renal function improved and hypotension resolved, will discuss with electrophysiology service regarding further plan.  Patient is symptomatic with atrial fibrillation and wished to convert back to sinus rhythm.  She was previously on flecainide and amiodarone.  Dr. Rayann Heman recommended sotalol back in February 2019, however at the time, she was very hesitant to be admitted to the hospital to start on sotalol.  2. Peripheral swelling: Related to hypoalbuminemia.  Albumin level was severely low at 1.9.  Intravascularly, she is likely very dry.  Phlebotomist tried multiple attempts to get blood from her without good success.  May need IV albumin.  3. GI bleed: Stool guaiac was positive.  She was  admitted for possible coffee-ground emesis.  Further work-up per GI service.  It was felt she likely has slow bleed related to her cirrhotic esophageal gastropathy.  4. Cirrhosis: Further work-up per GI service.  5. Hypotension: Lasix and spironolactone has been discontinued.  6. Acute kidney injury: Likely related to recent severe hypotension.  Baseline creatinine 1.3, creatinine on arrival was 4.0.  7. Hypothyroidism: Her free T4 was normal, however her TSH was 42.   For questions or updates, please contact [place] Please consult [email or web address] for contact info under Cardiology/STEMI.   Hilbert Corrigan, [place]  01/18/2018 4:50 PM

## 2018-01-18 NOTE — Progress Notes (Signed)
CRITICAL VALUE ALERT  Critical Value:  Lactic Acid 2.4 Date & Time Notied:  01/18/2018 1757 Provider Notified: Florene Glen Orders Received/Actions taken: None, this result is down from previous result [place]

## 2018-01-18 NOTE — Progress Notes (Addendum)
PROGRESS NOTE    Brittney Valentine  YKZ:[id number] DOB: July 04, 1940 DOA: 01/17/2018 PCP: Unk Pinto, MD   Brief Narrative:  this is a 77 year old woman with medical problems including cirrhosis, heart failure with preserved ejection fraction, age of fibrillation, poor embolus in June 2019, hypothyroidism who was admitted in July 2019 treated for multiple conditions including a UTI, bacteremia, C. Difficile colitis and is discharged on anticoagulation in addition to a diuretic regimen. For this July admission she was living independently, she is discharged to a skilled nursing facility to optimize functional status.  Patient reports she had an episode of emesis, staff there thought it was consistent with coffee grounds concern for GI bleed. The patient reports over the past one to days having burning in her chest, indigestion. She also reports receiving diuretics and regular basis. She reports having lower blood pressures with systolic blood pressure ranging from 75 and 90 millimeters mercury, she reports lightheadedness upon standing. She denies any diarrhea, fevers, chills, shortness of breath, chest pain.  There is report that at the skilled nursing facility, subcutaneous hydration was attempted.  She reports walking the day prior to admission with a forward walker, currently is needing assistance with her ADLs.   Assessment & Plan:   Active Problems:   AKI (acute kidney injury) ([place])   #Acute kidney injury with hyperkalemia:  Pt with cirrhosis so difficult to evaluate intravascular volume status.  Clinically with anasarca, but BP's have been soft and reported decreased intake as well as lightheadedness.  She was previously on lasix/spironolactone prior to presentation (lasix dose appears to have been increased and spiro appears to have been decreased at d/c from last hospitalization). - Creatinine peaked at 4.3, 3.87 today (baseline ~1.2) - hyperk mild, follow, s/p kayexalate  -  Continue IVF for now - I/O, daily weights - Foley catheter has been placed - C/s nephrology if not continuing to improve  *addendum: GI note reviewed, concern for HRS.  Started on albumin, octreotide.  Recommended renal consult.  Discussed with renal on call, added on midodrine.  Will consult in AM.    SIRS  Hypotension: elevated WBC, tachycardia, SBP's in 90's.  No obvious signs of infection.  UA with >RBC, 11-20 WBC, few bacteria, mod LE.  Follow blood and urine cx.  CXR with cardiomegaly with small pleural effusions and prominent bronchovascular markings. - follow blood and urine cx - IVF as noted above  Atrial Fibrillation with RVR: persistent this AM.  Continue dilt gtt.  BP's soft, IVF as above.  Will c/s cardiology to assist.  Concern for coffee ground emesis: hb of 11.6, dropped to 9.9 in setting of IVF.  Check hemoccult.  continue IV H2 blockade.  Hemoccult positive.  In setting of need for anticoagulation with recent VTE, will c/s GI.  EGD from last hospitalization with grade 2 esophageal varices and portal hypertensive gastropathy.  Decreased appetite: follow, no obvious cause on CT.  Nutrition c/s.   Hx of PE/DVT: in 11/2017 - was on DOAC prior to admission, hold AC until upper GI bleed ruled out  Lactic acidosis: persistent this AM.  Will bolus and continue IVF and follow.  Likely related to cirrhosis/hypotension.  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   Threw up yesterday.  They told her it looked like possible blood.  Objective: Vitals:   01/17/18 2230 01/17/18 2318 01/18/18 0514 01/18/18 0731  BP: 104/71 117/71 (!) 101/54 92/71  Pulse: (!) 113 (!) 111 (!) 105 (!) 108  Resp: 17 (!) 22 15 18   Temp:  97.8 F (36.6 C) (!) 97.5 F (36.4 C) (!) 97.5 F (36.4 C)  TempSrc:  Oral Oral Oral  SpO2: 97% 97% 97% 97%  Weight:  106.7 kg    Height:  5\' 5"  (1.651 m)      Intake/Output Summary (Last 24 hours) at 01/18/2018 0847 Last data filed at 01/18/2018 0829 Gross per 24 hour  Intake 1935 ml  Output -  Net 1935 ml   Filed Weights   01/17/18 1429 01/17/18 2318  Weight: 95.7 kg 106.7 kg    Examination:  General exam: Appears calm and comfortable  Respiratory system: Clear to auscultation. Respiratory effort normal. Cardiovascular system: S1 & S2 heard, RRR.  Gastrointestinal system: Abdomen is nondistended, soft and nontender. No organomegaly or masses felt. Normal bowel sounds heard. Central nervous system: Alert and oriented. No focal neurological deficits. Extremities: Bilateral LE edema. Edema noted to upper extremities as well.  Skin: No rashes, lesions or ulcers Psychiatry: Judgement and insight appear normal. Mood & affect appropriate.     Data Reviewed: I have personally reviewed following labs and imaging studies  CBC: Recent Labs  Lab 01/17/18 1436 01/17/18 1456 01/18/18 0542  WBC 11.2*  --  12.2*  HGB 11.0* 11.6* 9.9*  HCT 35.4* 34.0* 30.4*  MCV 95.4  --  93.5  PLT 619*  --  010*   Basic Metabolic Panel: Recent Labs  Lab 01/17/18 1436 01/17/18 1456 01/17/18 2211 01/18/18 0159 01/18/18 0542  NA 134* 134* 135  --  137  K 5.3* 5.2* 5.6* 5.4* 5.2*  CL 103 102 105  --  104  CO2 22  --  17*  --  20*  GLUCOSE 91 89 80  --  81  BUN  78* 73* 80*  --  83*  CREATININE 4.00* 4.30* 3.80*  --  3.87*  CALCIUM 9.9  --  9.7  --  9.7   GFR: Estimated Creatinine Clearance: 15.5 mL/min (A) (by C-G formula based on SCr of 3.87 mg/dL (H)). Liver Function Tests: Recent Labs  Lab 01/17/18 1436 01/18/18 0542  AST 68* 58*  ALT 38 33  ALKPHOS 85 75  BILITOT 2.0* 2.3*  PROT 5.9* 5.2*  ALBUMIN 2.1* 1.9*   Recent Labs  Lab 01/17/18 1436  LIPASE 80*   No results for input(s): AMMONIA in the last 168 hours. Coagulation Profile: No results for input(s): INR, PROTIME in the last 168 hours. Cardiac Enzymes: No results for input(s): CKTOTAL, CKMB, CKMBINDEX, TROPONINI in the last 168 hours. BNP (last 3 results) No results for input(s): PROBNP in the last 8760 hours. HbA1C: No results for input(s): HGBA1C in the last 72 hours. CBG: No results for input(s): GLUCAP in the last 168 hours. Lipid Profile: No results for input(s): CHOL, HDL, LDLCALC, TRIG, CHOLHDL, LDLDIRECT in the last 72 hours. Thyroid Function Tests: No results  for input(s): TSH, T4TOTAL, FREET4, T3FREE, THYROIDAB in the last 72 hours. Anemia Panel: No results for input(s): VITAMINB12, FOLATE, FERRITIN, TIBC, IRON, RETICCTPCT in the last 72 hours. Sepsis Labs: Recent Labs  Lab 01/17/18 2211 01/18/18 0541  LATICACIDVEN 2.3* 2.8*    Recent Results (from the past 240 hour(s))  MRSA PCR Screening     Status: None   Collection Time: 01/17/18 11:16 PM  Result Value Ref Range Status   MRSA by PCR NEGATIVE NEGATIVE Final    Comment:        The GeneXpert MRSA Assay (FDA approved for NASAL specimens only), is one component of a comprehensive MRSA colonization surveillance program. It is not intended to diagnose MRSA infection nor to guide or monitor treatment for MRSA infections. Performed at [facility], [place] [address] [place], [place] [address]          Radiology Studies: Ct Abdomen Pelvis Wo Contrast  Result Date: 01/17/2018 CLINICAL  DATA:  Coffee ground emesis with history of esophageal varices EXAM: CT ABDOMEN AND PELVIS WITHOUT CONTRAST TECHNIQUE: Multidetector CT imaging of the abdomen and pelvis was performed following the standard protocol without IV contrast. COMPARISON:  12/13/2017 FINDINGS: Lower chest: Bilateral pleural effusions are noted right considerably greater than left similar to that seen on the prior exam with underlying lower lobe atelectatic changes. Hepatobiliary: The liver is small and nodular in appearance consistent with underlying cirrhotic change. Stable hypodense lesions are noted in the posterior aspect of the right lobe of the liver as well as the lateral segment of the left lobe of the liver. The gallbladder has been surgically removed. Pancreas: Unremarkable. No pancreatic ductal dilatation or surrounding inflammatory changes. Spleen: Normal in size without focal abnormality. The previously seen splenomegaly is not well appreciated on today's exam. Adrenals/Urinary Tract: The adrenal glands are within normal limits. Tiny nonobstructing stone is now seen in the lower pole of the left kidney. No obstructive changes are noted. Persistent distal left ureteral stone is noted stable from the previous exam. The bladder is decompressed. Stomach/Bowel: The appendix has been surgically removed. Mild retained fecal material is noted although no constipation is seen. Some wall thickening in the small bowel is noted likely related to the underlying ascites. Postsurgical changes in the right lower quadrant are noted. No definitive obstructive changes seen. Vascular/Lymphatic: Aortic atherosclerosis. No enlarged abdominal or pelvic lymph nodes. Reproductive: Status post hysterectomy. No adnexal masses. Other: Moderate ascites is noted slightly increased when compared with the prior exam. Changes of anasarca are noted. These have increased in the interval from the prior exam. Musculoskeletal: Degenerative changes of the lumbar  spine are noted. IMPRESSION: Changes consistent with cirrhosis of the liver with stable hypodense lesions in both the right and left lobes as well as changes of ascites. The known esophageal varices are not well appreciated on this exam. Increasing changes of anasarca when compared with the prior study. Bilateral pleural effusions with associated lower lobe atelectasis/infiltrate. This is stable from the prior exam. Distal left ureteral stone which has migrated slightly in the interval from the prior exam. A tiny lower pole left renal stone is noted. Diffuse small bowel wall thickening likely related to the underlying ascites Electronically Signed   By: Inez Catalina M.D.   On: 01/17/2018 20:05   Dg Chest Port 1 View  Result Date: 01/17/2018 CLINICAL DATA:  Per ED notes, pt brought in for coffee ground emesis since this am. Pt has hx of afib- is on eliquis,  pt also has hx of esophageal varices. Pt endorses SOB with exertion. Per EMS pt's abdomen is rigid d/t subcutaneous fluid admin at facility. Pt is not eating, states even the sight of food makes her nauseous. Pt denies any pain. Hx of HTN. Non-smoker EXAM: PORTABLE CHEST 1 VIEW COMPARISON:  12/13/2017 FINDINGS: Cardiac silhouette is mildly enlarged. No mediastinal or hilar masses. There are prominent bronchovascular markings. There is hazy opacity in the lung bases consistent with small pleural effusions. No convincing pneumonia. No pneumothorax. Skeletal structures are grossly intact. IMPRESSION: 1. Mild cardiomegaly with small pleural effusions and prominent bronchovascular markings. The findings are similar to the prior study. Mild congestive heart failure is suspected given the symptoms of shortness of breath. Electronically Signed   By: Lajean Manes M.D.   On: 01/17/2018 15:29        Scheduled Meds: . levothyroxine  200 mcg Oral QAC breakfast  . sodium chloride flush  3 mL Intravenous Q12H   Continuous Infusions: . diltiazem (CARDIZEM)  infusion 2.5 mg/hr (01/17/18 2215)  . famotidine (PEPCID) IV    .  sodium bicarbonate (isotonic) infusion in sterile water 100 mL/hr at 01/18/18 0144  . sodium chloride       LOS: 1 day    Time spent: over 30 min MDM high with aki, elevated lactate, possible GI bleed   Fayrene Helper, MD [facility] Pager [phone]  If 7PM-7AM, please contact night-coverage [email or web address] Password [facility] 01/18/2018, 8:47 AM

## 2018-01-18 NOTE — Progress Notes (Signed)
PHARMACY - PHYSICIAN COMMUNICATION CRITICAL VALUE ALERT - BLOOD CULTURE IDENTIFICATION (BCID)  Brittney Valentine is an 77 y.o. female who presented to [facility] on 01/17/2018 with a chief complaint of emesis with with concern for GI bleed.   Assessment: Hypotensive on arrival with low suspicion for infection. 1/3 BCx with coag negative staph on BCID. Likely contaminant   Name of physician (or Provider) Contacted: Tylene Fantasia  Current antibiotics: None  Changes to prescribed antibiotics recommended:  No antibiotics necessary   Results for orders placed or performed during the hospital encounter of 12/13/17  Blood Culture ID Panel (Reflexed) (Collected: 12/13/2017  5:47 PM)  Result Value Ref Range   Enterococcus species NOT DETECTED NOT DETECTED   Listeria monocytogenes NOT DETECTED NOT DETECTED   Staphylococcus species NOT DETECTED NOT DETECTED   Staphylococcus aureus NOT DETECTED NOT DETECTED   Streptococcus species DETECTED (A) NOT DETECTED   Streptococcus agalactiae NOT DETECTED NOT DETECTED   Streptococcus pneumoniae NOT DETECTED NOT DETECTED   Streptococcus pyogenes NOT DETECTED NOT DETECTED   Acinetobacter baumannii NOT DETECTED NOT DETECTED   Enterobacteriaceae species NOT DETECTED NOT DETECTED   Enterobacter cloacae complex NOT DETECTED NOT DETECTED   Escherichia coli NOT DETECTED NOT DETECTED   Klebsiella oxytoca NOT DETECTED NOT DETECTED   Klebsiella pneumoniae NOT DETECTED NOT DETECTED   Proteus species NOT DETECTED NOT DETECTED   Serratia marcescens NOT DETECTED NOT DETECTED   Haemophilus influenzae NOT DETECTED NOT DETECTED   Neisseria meningitidis NOT DETECTED NOT DETECTED   Pseudomonas aeruginosa NOT DETECTED NOT DETECTED   Candida albicans NOT DETECTED NOT DETECTED   Candida glabrata NOT DETECTED NOT DETECTED   Candida krusei NOT DETECTED NOT DETECTED   Candida parapsilosis NOT DETECTED NOT DETECTED   Candida tropicalis NOT DETECTED NOT DETECTED    Albertina Parr, PharmD., BCPS Clinical Pharmacist Clinical phone for 01/18/18 until 3:30pm: [phone] If after 3:30pm, please refer to [facility] for unit-specific pharmacist

## 2018-01-19 ENCOUNTER — Encounter (HOSPITAL_COMMUNITY): Payer: Self-pay | Admitting: Critical Care Medicine

## 2018-01-19 ENCOUNTER — Encounter (HOSPITAL_COMMUNITY)
Admission: EM | Disposition: A | Discharge status: Hospice | Payer: Self-pay | Source: Skilled Nursing Facility | Attending: Family Medicine

## 2018-01-19 ENCOUNTER — Inpatient Hospital Stay (HOSPITAL_COMMUNITY): Payer: PPO | Admitting: Critical Care Medicine

## 2018-01-19 DIAGNOSIS — R710 Precipitous drop in hematocrit: Secondary | ICD-10-CM

## 2018-01-19 DIAGNOSIS — K921 Melena: Secondary | ICD-10-CM

## 2018-01-19 DIAGNOSIS — K2210 Ulcer of esophagus without bleeding: Secondary | ICD-10-CM

## 2018-01-19 DIAGNOSIS — I4891 Unspecified atrial fibrillation: Secondary | ICD-10-CM

## 2018-01-19 DIAGNOSIS — K922 Gastrointestinal hemorrhage, unspecified: Secondary | ICD-10-CM

## 2018-01-19 DIAGNOSIS — I82402 Acute embolism and thrombosis of unspecified deep veins of left lower extremity: Secondary | ICD-10-CM

## 2018-01-19 DIAGNOSIS — N179 Acute kidney failure, unspecified: Principal | ICD-10-CM

## 2018-01-19 DIAGNOSIS — I8501 Esophageal varices with bleeding: Secondary | ICD-10-CM

## 2018-01-19 DIAGNOSIS — R188 Other ascites: Secondary | ICD-10-CM

## 2018-01-19 DIAGNOSIS — K7460 Unspecified cirrhosis of liver: Secondary | ICD-10-CM

## 2018-01-19 HISTORY — PX: ESOPHAGEAL BANDING: SHX5518

## 2018-01-19 HISTORY — PX: ESOPHAGOGASTRODUODENOSCOPY (EGD) WITH PROPOFOL: SHX5813

## 2018-01-19 LAB — PROTIME-INR
INR: 2.68
INR: 2.54
PROTHROMBIN TIME: 28.3 s — AB (ref 11.4–15.2)
PROTHROMBIN TIME: 27.1 s — AB (ref 11.4–15.2)

## 2018-01-19 LAB — COMPREHENSIVE METABOLIC PANEL
ALBUMIN: 3.1 g/dL — AB (ref 3.5–5.0)
ALK PHOS: 44 U/L (ref 38–126)
ALT: 25 U/L (ref 0–44)
AST: 44 U/L — ABNORMAL HIGH (ref 15–41)
Anion gap: 12 (ref 5–15)
BUN: 85 mg/dL — ABNORMAL HIGH (ref 8–23)
CHLORIDE: 102 mmol/L (ref 98–111)
CO2: 21 mmol/L — AB (ref 22–32)
Calcium: 9.4 mg/dL (ref 8.9–10.3)
Creatinine, Ser: 4.05 mg/dL — ABNORMAL HIGH (ref 0.44–1.00)
GFR calc Af Amer: 12 mL/min — ABNORMAL LOW (ref >60)
GFR calc non Af Amer: 10 mL/min — ABNORMAL LOW (ref >60)
GLUCOSE: 89 mg/dL (ref 70–99)
Potassium: 4.5 mmol/L (ref 3.5–5.1)
SODIUM: 135 mmol/L (ref 135–145)
Total Bilirubin: 2.6 mg/dL — ABNORMAL HIGH (ref 0.3–1.2)
Total Protein: 5.1 g/dL — ABNORMAL LOW (ref 6.5–8.1)

## 2018-01-19 LAB — CBC
HCT: 20.4 % — ABNORMAL LOW (ref 36.0–46.0)
HEMOGLOBIN: 6.6 g/dL — AB (ref 12.0–15.0)
MCH: 30.6 pg (ref 26.0–34.0)
MCHC: 32.4 g/dL (ref 30.0–36.0)
MCV: 94.4 fL (ref 78.0–100.0)
PLATELETS: 352 10*3/uL (ref 150–400)
RBC: 2.16 MIL/uL — AB (ref 3.87–5.11)
RDW: 18.9 % — ABNORMAL HIGH (ref 11.5–15.5)
WBC: 6.1 10*3/uL (ref 4.0–10.5)

## 2018-01-19 LAB — UREA NITROGEN, URINE: UREA NITROGEN UR: 528 mg/dL

## 2018-01-19 LAB — CULTURE, BLOOD (ROUTINE X 2): Special Requests: ADEQUATE

## 2018-01-19 LAB — PREPARE RBC (CROSSMATCH)

## 2018-01-19 LAB — POCT I-STAT, CHEM 8
BUN: 82 mg/dL — AB (ref 8–23)
CREATININE: 4.2 mg/dL — AB (ref 0.44–1.00)
Calcium, Ion: 1.22 mmol/L (ref 1.15–1.40)
Chloride: 99 mmol/L (ref 98–111)
GLUCOSE: 107 mg/dL — AB (ref 70–99)
HEMATOCRIT: 27 % — AB (ref 36.0–46.0)
Hemoglobin: 9.2 g/dL — ABNORMAL LOW (ref 12.0–15.0)
Potassium: 4.7 mmol/L (ref 3.5–5.1)
Sodium: 134 mmol/L — ABNORMAL LOW (ref 135–145)
TCO2: 23 mmol/L (ref 22–32)

## 2018-01-19 LAB — LACTIC ACID, PLASMA: LACTIC ACID, VENOUS: 1.6 mmol/L (ref 0.5–1.9)

## 2018-01-19 LAB — MAGNESIUM: Magnesium: 2.6 mg/dL — ABNORMAL HIGH (ref 1.7–2.4)

## 2018-01-19 LAB — HEMOGLOBIN AND HEMATOCRIT, BLOOD
HCT: 24.2 % — ABNORMAL LOW (ref 36.0–46.0)
HEMATOCRIT: 25.4 % — AB (ref 36.0–46.0)
HEMOGLOBIN: 8.3 g/dL — AB (ref 12.0–15.0)
HEMOGLOBIN: 7.9 g/dL — AB (ref 12.0–15.0)

## 2018-01-19 SURGERY — ESOPHAGOGASTRODUODENOSCOPY (EGD) WITH PROPOFOL
Anesthesia: Monitor Anesthesia Care

## 2018-01-19 MED ORDER — MIDAZOLAM HCL 5 MG/5ML IJ SOLN
INTRAMUSCULAR | Status: DC | PRN
  Administered 2018-01-19: 1 mg via INTRAVENOUS

## 2018-01-19 MED ORDER — KETAMINE HCL 50 MG/5ML IJ SOSY
PREFILLED_SYRINGE | INTRAMUSCULAR | Status: AC
  Filled 2018-01-19: qty 5

## 2018-01-19 MED ORDER — KETAMINE HCL 10 MG/ML IJ SOLN
INTRAMUSCULAR | Status: DC | PRN
  Administered 2018-01-19: 20 mg via INTRAVENOUS

## 2018-01-19 MED ORDER — SODIUM CHLORIDE 0.9% IV SOLUTION
Freq: Once | INTRAVENOUS | Status: AC
  Administered 2018-01-19: 10:00:00 via INTRAVENOUS

## 2018-01-19 MED ORDER — FUROSEMIDE 10 MG/ML IJ SOLN
80.0000 mg | Freq: Once | INTRAMUSCULAR | Status: AC
  Administered 2018-01-19: 80 mg via INTRAVENOUS
  Filled 2018-01-19: qty 8

## 2018-01-19 MED ORDER — IPRATROPIUM-ALBUTEROL 0.5-2.5 (3) MG/3ML IN SOLN
3.0000 mL | Freq: Four times a day (QID) | RESPIRATORY_TRACT | Status: DC | PRN
  Administered 2018-01-19 – 2018-01-20 (×2): 3 mL via RESPIRATORY_TRACT
  Filled 2018-01-19 (×2): qty 3

## 2018-01-19 MED ORDER — DEXTROSE 5 % IV SOLN
1.0000 mg | Freq: Once | INTRAVENOUS | Status: AC
  Administered 2018-01-19: 1 mg via INTRAVENOUS
  Filled 2018-01-19: qty 0.1

## 2018-01-19 MED ORDER — SODIUM CHLORIDE 0.9 % IV SOLN
2.0000 g | INTRAVENOUS | Status: DC
  Administered 2018-01-19: 2 g via INTRAVENOUS
  Filled 2018-01-19 (×2): qty 20

## 2018-01-19 MED ORDER — PROPOFOL 500 MG/50ML IV EMUL
INTRAVENOUS | Status: DC | PRN
  Administered 2018-01-19: 25 ug/kg/min via INTRAVENOUS

## 2018-01-19 MED ORDER — LEVOTHYROXINE SODIUM 75 MCG PO TABS
225.0000 ug | ORAL_TABLET | Freq: Every day | ORAL | Status: DC
  Administered 2018-01-20: 225 ug via ORAL
  Filled 2018-01-19: qty 3

## 2018-01-19 SURGICAL SUPPLY — 15 items

## 2018-01-19 NOTE — Transfer of Care (Signed)
Immediate Anesthesia Transfer of Care Note  Patient: Brittney Valentine  Procedure(s) Performed: ESOPHAGOGASTRODUODENOSCOPY (EGD) WITH PROPOFOL (N/A )  Patient Location: Endoscopy Unit  Anesthesia Type:MAC  Level of Consciousness: awake  Airway & Oxygen Therapy: Patient Spontanous Breathing and Patient connected to nasal cannula oxygen  Post-op Assessment: Report given to RN and Post -op Vital signs reviewed and stable  Post vital signs: Reviewed and stable  Last Vitals:  Vitals Value Taken Time  BP 103/53 01/19/2018  3:15 PM  Temp 36.5 C 01/19/2018  3:15 PM  Pulse 65 01/19/2018  3:15 PM  Resp 16 01/19/2018  3:15 PM  SpO2 97 % 01/19/2018  3:15 PM    Last Pain:  Vitals:   01/19/18 1515  TempSrc: Oral  PainSc:          Complications: No apparent anesthesia complications

## 2018-01-19 NOTE — Progress Notes (Signed)
PROGRESS NOTE    Brittney Valentine  BTD:[id number] DOB: 1940-10-01 DOA: 01/17/2018 PCP: Unk Pinto, MD   Brief Narrative:  this is Brittney Valentine 77 year old woman with medical problems including cirrhosis, heart failure with preserved ejection fraction, age of fibrillation, poor embolus in June 2019, hypothyroidism who was admitted in July 2019 treated for multiple conditions including Brittney Valentine UTI, bacteremia, C. Difficile colitis and is discharged on anticoagulation in addition to Brittney Burchill diuretic regimen. For this July admission she was living independently, she is discharged to [facility] facility to optimize functional status.  Patient reports she had an episode of emesis, staff there thought it was consistent with coffee grounds concern for GI bleed. The patient reports over the past one to days having burning in her chest, indigestion. She also reports receiving diuretics and regular basis. She reports having lower blood pressures with systolic blood pressure ranging from 75 and 90 millimeters mercury, she reports lightheadedness upon standing. She denies any diarrhea, fevers, chills, shortness of breath, chest pain.  There is report that at the skilled nursing facility, subcutaneous hydration was attempted.  She reports walking the day prior to admission with Brittney Valentine forward walker, currently is needing assistance with her ADLs.   Assessment & Plan:   Active Problems:   AKI (acute kidney injury) ([place])   #Acute kidney injury with hyperkalemia:  Pt with cirrhosis.  Clinically with anasarca, but BP's have been soft and reported decreased intake as well as lightheadedness.  She was previously on lasix/spironolactone prior to presentation (lasix dose appears to have been increased and spiro appears to have been decreased at d/c from last hospitalization).  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 - Creatinine peaked at 4.3, 4.05 today (baseline ~1.2) - midodrine, octreotide, albumin -  giving dose of lasix as noted below - renal consult - hyperk mild, improved - I/O, daily weights - Foley catheter has been placed  Coffee Ground Emesis  Acute Blood Loss Anemia:  H/H 6.6 this morning, down from 11.2 last night Transfuse 1 unit pRBC - will give dose of lasix IV PPI BID, octreotide EGD this afternoon Transfuse for <7  Hx of PE/DVT: in 11/2017.  In setting of recent VTE in 11/2017 with ABLA above, will consult vascular surgery regarding filter placement.    Atrial Fibrillation with RVR: persistent this AM.  Continue dilt gtt.  Improved.  Will continue dilt gtt and transition to PO tomorrow after procedure if able.   SIRS  Hypotension  Gram negative UTI: WBC improved.  HR improved.  BP improved with midodrine.  No obvious signs of infection.  UA with >RBC, 11-20 WBC, few bacteria, mod LE.  Follow blood and urine cx.  CXR with cardiomegaly with small pleural effusions and prominent bronchovascular markings. - coag negative staph in aerobic bottle only (likely contaminant) - urine cx with gram negative rods, will treat with ceftriaxone  Decreased appetite: follow, no obvious cause on CT.  Nutrition c/s.   Lactic acidosis: improved  Heart failure with preserved EF: diuresing with lasix as noted above  Hypothyroidism: she notes compliance, but increased TSH.  Will increase dose to 225 mcg.  Follow outpatient.   Cirrhosis: hx of nash cirrhosis.  MELD 34. With elevated INR.  Will give dose of vitamin K.   Thrombocytosis: follow, possibly reactive  DVT prophylaxis: SCD Code Status: DNR Family Communication: none at bedside Disposition Plan: pending improvement   Consultants:   GI, cardiology  Procedures:   none  Antimicrobials:  none    Subjective: Notes some SOB.  No vomiting. Dark stool.   Objective: Vitals:   01/19/18 0810 01/19/18 0930 01/19/18 0947 01/19/18 1006  BP: (!) 84/51 107/65 107/65 101/60  Pulse: 94 86 66 86  Resp: (!) 24 19 (!) 23  (!) 22  Temp: 97.9 F (36.6 C)  (!) 97.5 F (36.4 C) (!) 97.4 F (36.3 C)  TempSrc: Oral  Oral Oral  SpO2: 93% 96% 95% 95%  Weight:      Height:        Intake/Output Summary (Last 24 hours) at 01/19/2018 1135 Last data filed at 01/19/2018 0951 Gross per 24 hour  Intake 2299.54 ml  Output 260 ml  Net 2039.54 ml   Filed Weights   01/17/18 1429 01/17/18 2318 01/19/18 0353  Weight: 95.7 kg 106.7 kg 110 kg    Examination:  General: No acute distress. Cardiovascular: Heart sounds show Brittney Valentine regular rate, and rhythm.  Lungs: audible wheezing, crackles Abdomen: Soft, nontender, distended Neurological: Alert and oriented 3. Moves all extremities 4. Cranial nerves II through XII grossly intact. Skin: Warm and dry. No rashes or lesions. Extremities: bilateral lower extremity edema Psychiatric: Mood and affect are normal. Insight and judgment are appropriate.   Data Reviewed: I have personally reviewed following labs and imaging studies  CBC: Recent Labs  Lab 01/17/18 1436 01/17/18 1456 01/18/18 0542 01/18/18 2008 01/19/18 0705  WBC 11.2*  --  12.2*  --  6.1  HGB 11.0* 11.6* 9.9* 11.2* 6.6*  HCT 35.4* 34.0* 30.4* 33.5* 20.4*  MCV 95.4  --  93.5  --  94.4  PLT 619*  --  619*  --  130   Basic Metabolic Panel: Recent Labs  Lab 01/17/18 1436 01/17/18 1456 01/17/18 2211  01/18/18 0542 01/18/18 0921 01/18/18 1220 01/18/18 1646 01/19/18 0705  NA 134* 134* 135  --  137  --   --  133* 135  K 5.3* 5.2* 5.6*   < > 5.2* 5.5* 4.7 5.2* 4.5  CL 103 102 105  --  104  --   --  102 102  CO2 22  --  17*  --  20*  --   --  19* 21*  GLUCOSE 91 89 80  --  81  --   --  86 89  BUN 78* 73* 80*  --  83*  --   --  84* 85*  CREATININE 4.00* 4.30* 3.80*  --  3.87*  --   --  3.79* 4.05*  CALCIUM 9.9  --  9.7  --  9.7  --   --  9.1 9.4  MG  --   --   --   --   --   --   --   --  2.6*   < > = values in this interval not displayed.   GFR: Estimated Creatinine Clearance: 15 mL/min (Brittney Valentine) (by C-G  formula based on SCr of 4.05 mg/dL (H)). Liver Function Tests: Recent Labs  Lab 01/17/18 1436 01/18/18 0542 01/19/18 0705  AST 68* 58* 44*  ALT 38 33 25  ALKPHOS 85 75 44  BILITOT 2.0* 2.3* 2.6*  PROT 5.9* 5.2* 5.1*  ALBUMIN 2.1* 1.9* 3.1*   Recent Labs  Lab 01/17/18 1436  LIPASE 80*   No results for input(s): AMMONIA in the last 168 hours. Coagulation Profile: Recent Labs  Lab 01/19/18 0437 01/19/18 0705  INR 2.68 2.54   Cardiac Enzymes: No results for input(s): CKTOTAL, CKMB, CKMBINDEX, TROPONINI  in the last 168 hours. BNP (last 3 results) No results for input(s): PROBNP in the last 8760 hours. HbA1C: No results for input(s): HGBA1C in the last 72 hours. CBG: No results for input(s): GLUCAP in the last 168 hours. Lipid Profile: No results for input(s): CHOL, HDL, LDLCALC, TRIG, CHOLHDL, LDLDIRECT in the last 72 hours. Thyroid Function Tests: Recent Labs    01/18/18 0921 01/18/18 1220  TSH 42.291*  --   FREET4  --  0.85   Anemia Panel: No results for input(s): VITAMINB12, FOLATE, FERRITIN, TIBC, IRON, RETICCTPCT in the last 72 hours. Sepsis Labs: Recent Labs  Lab 01/18/18 0541 01/18/18 0921 01/18/18 1646 01/19/18 0437  LATICACIDVEN 2.8* 2.9* 2.4* 1.6    Recent Results (from the past 240 hour(s))  Culture, blood (routine x 2)     Status: None (Preliminary result)   Collection Time: 01/17/18 10:15 PM  Result Value Ref Range Status   Specimen Description BLOOD LEFT WRIST  Final   Special Requests   Final    BOTTLES DRAWN AEROBIC AND ANAEROBIC Blood Culture results may not be optimal due to an inadequate volume of blood received in culture bottles   Culture   Final    NO GROWTH 2 DAYS Performed at [facility] [address] [place], [place] [address]    Report Status PENDING  Incomplete  MRSA PCR Screening     Status: None   Collection Time: 01/17/18 11:16 PM  Result Value Ref Range Status   MRSA by PCR NEGATIVE NEGATIVE Final    Comment:         The GeneXpert MRSA Assay (FDA approved for NASAL specimens only), is one component of [facility]ner comprehensive MRSA colonization surveillance program. It is not intended to diagnose MRSA infection nor to guide or monitor treatment for MRSA infections. Performed at [facility], [place] [address] [place], [place] [address]   Culture, blood (routine x 2)     Status: Abnormal (Preliminary result)   Collection Time: 01/17/18 11:18 PM  Result Value Ref Range Status   Specimen Description BLOOD RIGHT HAND  Final   Special Requests   Final    BOTTLES DRAWN AEROBIC ONLY Blood Culture adequate volume   Culture  Setup Time   Final    AEROBIC BOTTLE ONLY GRAM POSITIVE COCCI CRITICAL RESULT CALLED TO, READ BACK BY AND VERIFIED WITHAsencion Islam [facility] 01/18/18 1858 JDW Performed at [facility], [place] [address] [place],  [address]    Culture STAPHYLOCOCCUS SPECIES (COAGULASE NEGATIVE) (Kathelyn Gombos)  Final   Report Status PENDING  Incomplete  Blood Culture ID Panel (Reflexed)     Status: Abnormal   Collection Time: 01/17/18 11:18 PM  Result Value Ref Range Status   Enterococcus species NOT DETECTED NOT DETECTED Final   Listeria monocytogenes NOT DETECTED NOT DETECTED Final   Staphylococcus species DETECTED (Salam Micucci) NOT DETECTED Final    Comment: Methicillin (oxacillin) resistant coagulase negative staphylococcus. Possible blood culture contaminant (unless isolated from more than one blood culture draw or clinical case suggests pathogenicity). No antibiotic treatment is indicated for blood  culture contaminants. CRITICAL RESULT CALLED TO, READ BACK BY AND VERIFIED WITH: B MACHERIL PHARMD 01/18/18 1903 JDW    Staphylococcus aureus NOT DETECTED NOT DETECTED Final   Methicillin resistance DETECTED (Paytin Ramakrishnan) NOT DETECTED Final    Comment: CRITICAL RESULT CALLED TO, READ BACK BY AND VERIFIED WITH: B MACHERIL PHARMD 01/18/18 1903 JDW    Streptococcus species NOT DETECTED NOT  DETECTED Final    Streptococcus agalactiae NOT DETECTED NOT DETECTED Final   Streptococcus pneumoniae NOT DETECTED NOT DETECTED Final   Streptococcus pyogenes NOT DETECTED NOT DETECTED Final   Acinetobacter baumannii NOT DETECTED NOT DETECTED Final   Enterobacteriaceae species NOT DETECTED NOT DETECTED Final   Enterobacter cloacae complex NOT DETECTED NOT DETECTED Final   Escherichia coli NOT DETECTED NOT DETECTED Final   Klebsiella oxytoca NOT DETECTED NOT DETECTED Final   Klebsiella pneumoniae NOT DETECTED NOT DETECTED Final   Proteus species NOT DETECTED NOT DETECTED Final   Serratia marcescens NOT DETECTED NOT DETECTED Final   Haemophilus influenzae NOT DETECTED NOT DETECTED Final   Neisseria meningitidis NOT DETECTED NOT DETECTED Final   Pseudomonas aeruginosa NOT DETECTED NOT DETECTED Final   Candida albicans NOT DETECTED NOT DETECTED Final   Candida glabrata NOT DETECTED NOT DETECTED Final   Candida krusei NOT DETECTED NOT DETECTED Final   Candida parapsilosis NOT DETECTED NOT DETECTED Final   Candida tropicalis NOT DETECTED NOT DETECTED Final    Comment: Performed at [facility], [place] [address], [place], [place] [address]  Culture, Urine     Status: Abnormal (Preliminary result)   Collection Time: 01/18/18  9:15 AM  Result Value Ref Range Status   Specimen Description URINE, CATHETERIZED  Final   Special Requests   Final    NONE Performed at [facility], [place] [address] [place], [place] [address]    Culture 30,000 COLONIES/mL GRAM NEGATIVE RODS (Hewitt Garner)  Final   Report Status PENDING  Incomplete         Radiology Studies: Ct Abdomen Pelvis Wo Contrast  Result Date: 01/17/2018 CLINICAL DATA:  Coffee ground emesis with history of esophageal varices EXAM: CT ABDOMEN AND PELVIS WITHOUT CONTRAST TECHNIQUE: Multidetector CT imaging of the abdomen and pelvis was performed following the standard protocol without IV contrast. COMPARISON:  12/13/2017 FINDINGS: Lower chest: Bilateral  pleural effusions are noted right considerably greater than left similar to that seen on the prior exam with underlying lower lobe atelectatic changes. Hepatobiliary: The liver is small and nodular in appearance consistent with underlying cirrhotic change. Stable hypodense lesions are noted in the posterior aspect of the right lobe of the liver as well as the lateral segment of the left lobe of the liver. The gallbladder has been surgically removed. Pancreas: Unremarkable. No pancreatic ductal dilatation or surrounding inflammatory changes. Spleen: Normal in size without focal abnormality. The previously seen splenomegaly is not well appreciated on today's exam. Adrenals/Urinary Tract: The adrenal glands are within normal limits. Tiny nonobstructing stone is now seen in the lower pole of the left kidney. No obstructive changes are noted. Persistent distal left ureteral stone is noted stable from the previous exam. The bladder is decompressed. Stomach/Bowel: The appendix has been surgically removed. Mild retained fecal material is noted although no constipation is seen. Some wall thickening in the small bowel is noted likely related to the underlying ascites. Postsurgical changes in the right lower quadrant are noted. No definitive obstructive changes seen. Vascular/Lymphatic: Aortic atherosclerosis. No enlarged abdominal or pelvic lymph nodes. Reproductive: Status post hysterectomy. No adnexal masses. Other: Moderate ascites is noted slightly increased when compared with the prior exam. Changes of anasarca are noted. These have increased in the interval from the prior exam. Musculoskeletal: Degenerative changes of the lumbar spine are noted. IMPRESSION: Changes consistent with cirrhosis of the liver with stable hypodense lesions in both the right and left lobes as well as changes of  ascites. The known esophageal varices are not well appreciated on this exam. Increasing changes of anasarca when compared with the  prior study. Bilateral pleural effusions with associated lower lobe atelectasis/infiltrate. This is stable from the prior exam. Distal left ureteral stone which has migrated slightly in the interval from the prior exam. Brendaly Townsel tiny lower pole left renal stone is noted. Diffuse small bowel wall thickening likely related to the underlying ascites Electronically Signed   By: Inez Catalina M.D.   On: 01/17/2018 20:05   Dg Chest Port 1 View  Result Date: 01/17/2018 CLINICAL DATA:  Per ED notes, pt brought in for coffee ground emesis since this am. Pt has hx of afib- is on eliquis, pt also has hx of esophageal varices. Pt endorses SOB with exertion. Per EMS pt's abdomen is rigid d/t subcutaneous fluid admin at facility. Pt is not eating, states even the sight of food makes her nauseous. Pt denies any pain. Hx of HTN. Non-smoker EXAM: PORTABLE CHEST 1 VIEW COMPARISON:  12/13/2017 FINDINGS: Cardiac silhouette is mildly enlarged. No mediastinal or hilar masses. There are prominent bronchovascular markings. There is hazy opacity in the lung bases consistent with small pleural effusions. No convincing pneumonia. No pneumothorax. Skeletal structures are grossly intact. IMPRESSION: 1. Mild cardiomegaly with small pleural effusions and prominent bronchovascular markings. The findings are similar to the prior study. Mild congestive heart failure is suspected given the symptoms of shortness of breath. Electronically Signed   By: Lajean Manes M.D.   On: 01/17/2018 15:29        Scheduled Meds: . furosemide  80 mg Intravenous Once  . levothyroxine  200 mcg Oral QAC breakfast  . midodrine  10 mg Oral TID WC  . pantoprazole (PROTONIX) IV  40 mg Intravenous BID  . sodium chloride flush  3 mL Intravenous Q12H   Continuous Infusions: . albumin human 100 g (01/18/18 1857)  . diltiazem (CARDIZEM) infusion 5 mg/hr (01/19/18 0859)  . octreotide  (SANDOSTATIN)    IV infusion 50 mcg/hr (01/19/18 0605)     LOS: 2 days     Time spent: over 30 min MDM high with aki, acute blood loss anemia, cirrhosis   Fayrene Helper, MD [facility] Pager [phone]  If 7PM-7AM, please contact night-coverage [email or web address] Password [facility] 01/19/2018, 11:35 AM

## 2018-01-19 NOTE — Progress Notes (Signed)
Lab called with critical lab result: Hemoglobin 6.6. Dr. Florene Glen paged at this time.  Emelda Fear, RN

## 2018-01-19 NOTE — Anesthesia Procedure Notes (Signed)
Procedure Name: MAC Date/Time: 01/19/2018 2:45 PM Performed by: Wilburn Cornelia, CRNA Pre-anesthesia Checklist: Patient identified, Emergency Drugs available, Suction available, Patient being monitored and Timeout performed Patient Re-evaluated:Patient Re-evaluated prior to induction Oxygen Delivery Method: Nasal cannula Placement Confirmation: positive ETCO2 and breath sounds checked- equal and bilateral Dental Injury: Teeth and Oropharynx as per pre-operative assessment

## 2018-01-19 NOTE — Progress Notes (Addendum)
Patient ID: Brittney Valentine, female   DOB: 03/07/1941, 76 y.o.   MRN: [id number]    Progress Note   Subjective   Feels SOB, wheezing a little - says she does that at times. No emesis, had one BM during night according to nurses /black  Being transfused one unit currently   Objective   Vital signs in last 24 hours: Temp:  [97.4 F (36.3 C)-98.1 F (36.7 C)] 97.4 F (36.3 C) (08/13 1006) Pulse Rate:  [66-103] 86 (08/13 1006) Resp:  [15-24] 22 (08/13 1006) BP: (84-111)/(38-93) 101/60 (08/13 1006) SpO2:  [93 %-100 %] 95 % (08/13 1006) Weight:  [110 kg] 110 kg (08/13 0353) Last BM Date: 01/18/18 General:  Ill appearing elderly  white female in NAD audible wheezing/squeak Heart:  irregular rate and rhythm; no murmurs Lungs: Respirations even decreased BS bilaterally, with rales up 2/3 bilat Abdomen:  Soft, nontender , BS+ , large, no fluid wave , extensive soft tissue edema in lower abdomen Extremities:  2+ edema above knees. Neurologic:  Alert and oriented,  grossly normal neurologically. Psych:  Cooperative. Normal mood and affect.  Intake/Output from previous day: 08/12 0701 - 08/13 0700 In: 2159.5 [P.O.:600; I.V.:1109.5; IV Piggyback:450] Out: 335 [Urine:335] Intake/Output this shift: Total I/O In: 250 [I.V.:250] Out: -   Lab Results: Recent Labs    01/17/18 1436  01/18/18 0542 01/18/18 2008 01/19/18 0705  WBC 11.2*  --  12.2*  --  6.1  HGB 11.0*   < > 9.9* 11.2* 6.6*  HCT 35.4*   < > 30.4* 33.5* 20.4*  PLT 619*  --  619*  --  352   < > = values in this interval not displayed.   BMET Recent Labs    01/18/18 0542  01/18/18 1220 01/18/18 1646 01/19/18 0705  NA 137  --   --  133* 135  K 5.2*   < > 4.7 5.2* 4.5  CL 104  --   --  102 102  CO2 20*  --   --  19* 21*  GLUCOSE 81  --   --  86 89  BUN 83*  --   --  84* 85*  CREATININE 3.87*  --   --  3.79* 4.05*  CALCIUM 9.7  --   --  9.1 9.4   < > = values in this interval not displayed.   LFT Recent Labs   01/19/18 0705  PROT 5.1*  ALBUMIN 3.1*  AST 44*  ALT 25  ALKPHOS 44  BILITOT 2.6*   PT/INR Recent Labs    01/19/18 0437 01/19/18 0705  LABPROT 28.3* 27.1*  INR 2.68 2.54    Studies/Results: Ct Abdomen Pelvis Wo Contrast  Result Date: 01/17/2018 CLINICAL DATA:  Coffee ground emesis with history of esophageal varices EXAM: CT ABDOMEN AND PELVIS WITHOUT CONTRAST TECHNIQUE: Multidetector CT imaging of the abdomen and pelvis was performed following the standard protocol without IV contrast. COMPARISON:  12/13/2017 FINDINGS: Lower chest: Bilateral pleural effusions are noted right considerably greater than left similar to that seen on the prior exam with underlying lower lobe atelectatic changes. Hepatobiliary: The liver is small and nodular in appearance consistent with underlying cirrhotic change. Stable hypodense lesions are noted in the posterior aspect of the right lobe of the liver as well as the lateral segment of the left lobe of the liver. The gallbladder has been surgically removed. Pancreas: Unremarkable. No pancreatic ductal dilatation or surrounding inflammatory changes. Spleen: Normal in size without focal abnormality. The   previously seen splenomegaly is not well appreciated on today's exam. Adrenals/Urinary Tract: The adrenal glands are within normal limits. Tiny nonobstructing stone is now seen in the lower pole of the left kidney. No obstructive changes are noted. Persistent distal left ureteral stone is noted stable from the previous exam. The bladder is decompressed. Stomach/Bowel: The appendix has been surgically removed. Mild retained fecal material is noted although no constipation is seen. Some wall thickening in the small bowel is noted likely related to the underlying ascites. Postsurgical changes in the right lower quadrant are noted. No definitive obstructive changes seen. Vascular/Lymphatic: Aortic atherosclerosis. No enlarged abdominal or pelvic lymph nodes. Reproductive:  Status post hysterectomy. No adnexal masses. Other: Moderate ascites is noted slightly increased when compared with the prior exam. Changes of anasarca are noted. These have increased in the interval from the prior exam. Musculoskeletal: Degenerative changes of the lumbar spine are noted. IMPRESSION: Changes consistent with cirrhosis of the liver with stable hypodense lesions in both the right and left lobes as well as changes of ascites. The known esophageal varices are not well appreciated on this exam. Increasing changes of anasarca when compared with the prior study. Bilateral pleural effusions with associated lower lobe atelectasis/infiltrate. This is stable from the prior exam. Distal left ureteral stone which has migrated slightly in the interval from the prior exam. A tiny lower pole left renal stone is noted. Diffuse small bowel wall thickening likely related to the underlying ascites Electronically Signed   By: Mark  Lukens M.D.   On: 01/17/2018 20:05   Dg Chest Port 1 View  Result Date: 01/17/2018 CLINICAL DATA:  Per ED notes, pt brought in for coffee ground emesis since this am. Pt has hx of afib- is on eliquis, pt also has hx of esophageal varices. Pt endorses SOB with exertion. Per EMS pt's abdomen is rigid d/t subcutaneous fluid admin at facility. Pt is not eating, states even the sight of food makes her nauseous. Pt denies any pain. Hx of HTN. Non-smoker EXAM: PORTABLE CHEST 1 VIEW COMPARISON:  12/13/2017 FINDINGS: Cardiac silhouette is mildly enlarged. No mediastinal or hilar masses. There are prominent bronchovascular markings. There is hazy opacity in the lung bases consistent with small pleural effusions. No convincing pneumonia. No pneumothorax. Skeletal structures are grossly intact. IMPRESSION: 1. Mild cardiomegaly with small pleural effusions and prominent bronchovascular markings. The findings are similar to the prior study. Mild congestive heart failure is suspected given the symptoms  of shortness of breath. Electronically Signed   By: David  Ormond M.D.   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 Only one BM overnight but hgb dropped almost 5 g overnight.  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 need for filter per primary service  Plan:  Transfuse to keep Hb 7-8 Continue IV PPI BID Continue Octreotide  Albumin ordered for   today - second day  EGD this afternoon with MAC with Dr. Gavyn Zoss Discussed decline in status with nephew    LOS: 2 days   [name] PA-C 01/19/2018, 10:30 AM      Attending physician's note   I have taken an interval history, reviewed the chart and examined the patient. I agree with the Advanced Practitioner's note, impression and recommendations.   Ronav Furney, MD FACG [phone] office   

## 2018-01-19 NOTE — Op Note (Signed)
[facility] Patient Name: Brittney Valentine Procedure Date : 01/19/2018 MRN: [id number] Attending MD: Ladene Artist , MD Date of Birth: April 10, 1941 CSN: [id number] Age: 77 Admit Type: Inpatient Procedure:                Upper GI endoscopy Indications:              Suspected upper gastrointestinal bleeding, history                            of esophageal varices Providers:                Pricilla Riffle. Fuller Plan, MD, Burtis Junes, RN, Laurena Spies, Technician, Merrilyn Puma, CRNA Referring MD:             [facility] Medicines:                Monitored Anesthesia Care Complications:            No immediate complications. Estimated Blood Loss:     Estimated blood loss: none. Procedure:                Pre-Anesthesia Assessment:                           - Prior to the procedure, a History and Physical                            was performed, and patient medications and                            allergies were reviewed. The patient's tolerance of                            previous anesthesia was also reviewed. The risks                            and benefits of the procedure and the sedation                            options and risks were discussed with the patient.                            All questions were answered, and informed consent                            was obtained. Prior Anticoagulants: The patient has                            taken Eliquis (apixaban), last dose was 2 days                            prior to procedure. ASA Grade Assessment: III - A  patient with severe systemic disease. After                            reviewing the risks and benefits, the patient was                            deemed in satisfactory condition to undergo the                            procedure.                           After obtaining informed consent, the endoscope was                            passed under direct  vision. Throughout the                            procedure, the patient's blood pressure, pulse, and                            oxygen saturations were monitored continuously. The                            [id number]-H190 ([id number] Olympus Adult EGD was introduced                            through the mouth, and advanced to the second part                            of duodenum. The upper GI endoscopy was                            accomplished without difficulty. The patient                            tolerated the procedure well. Scope In: Scope Out: Findings:      Grade II varices were found in the distal esophagus. The larger varix       had a superficial ulceration. The varices were 6 mm in largest diameter.       Three bands (2 on the larger, 1 on the smaller) were successfully placed       with complete eradication, resulting in deflation of varices. There was       no bleeding during, and at the end, of the procedure.      The exam of the esophagus was otherwise normal.      A large amount of food (residue) was found in the gastric fundus and in       the gastric body that significantly limited the exam of the stomach.       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      The duodenal bulb and second portion of the duodenum were normal. Impression:               -  Grade II esophageal varices. One with an ulcer.                            Banded. Completely eradicated.                           - A large amount of food (residue) in the stomach.                           - Normal duodenal bulb and second portion of the                            duodenum.                           - No specimens collected. Moderate Sedation:      none/MAC Recommendation:           - Return patient to hospital ward for ongoing care.                           - Clear liquid diet today, and if stable advance as                             tolerated to full liquid diet tomorrow.                           - No aspirin, ibuprofen, naproxen, or other                            non-steroidal anti-inflammatory drugs.                           - Continue IV PPI and IV octreotide for now.                           - Repeat EGD with banding with Dr. Carlean Purl in 4-6                            weeks. Procedure Code(s):        --- Professional ---                           [phone], Esophagogastroduodenoscopy, flexible,                            transoral; with band ligation of esophageal/gastric                            varices Diagnosis Code(s):        --- Professional ---                           I85.00, Esophageal varices without bleeding CPT copyright 2017 [employer]. All rights reserved. The codes documented in this report are preliminary and upon coder review may  be revised to meet current  compliance requirements. Ladene Artist, MD 01/19/2018 3:23:10 PM This report has been signed electronically. Number of Addenda: 0

## 2018-01-19 NOTE — Anesthesia Preprocedure Evaluation (Signed)
Anesthesia Evaluation  Patient identified by MRN, date of birth, ID band Patient awake    Reviewed: Allergy & Precautions, NPO status , Patient's Chart, lab work & pertinent test results  Airway Mallampati: II  TM Distance: >3 FB     Dental   Pulmonary asthma , sleep apnea ,    breath sounds clear to auscultation       Cardiovascular hypertension, Pt. on medications and Pt. on home beta blockers +CHF  + dysrhythmias  Rhythm:Regular Rate:Normal     Neuro/Psych negative neurological ROS     GI/Hepatic GERD  ,(+) Cirrhosis   Esophageal Varices    ,   Endo/Other  Hypothyroidism   Renal/GU ARFRenal disease     Musculoskeletal  (+) Arthritis ,   Abdominal   Peds  Hematology  (+) anemia ,   Anesthesia Other Findings   Reproductive/Obstetrics                             Lab Results  Component Value Date   WBC 6.1 01/19/2018   HGB 6.6 (LL) 01/19/2018   HCT 20.4 (L) 01/19/2018   MCV 94.4 01/19/2018   PLT 352 01/19/2018   Lab Results  Component Value Date   CREATININE 4.05 (H) 01/19/2018   BUN 85 (H) 01/19/2018   NA 135 01/19/2018   K 4.5 01/19/2018   CL 102 01/19/2018   CO2 21 (L) 01/19/2018    Anesthesia Physical Anesthesia Plan  ASA: IV  Anesthesia Plan: MAC   Post-op Pain Management:    Induction: Intravenous  PONV Risk Score and Plan: 2 and Propofol infusion, Ondansetron and Treatment may vary due to age or medical condition  Airway Management Planned: Natural Airway and Mask  Additional Equipment:   Intra-op Plan:   Post-operative Plan:   Informed Consent: I have reviewed the patients History and Physical, chart, labs and discussed the procedure including the risks, benefits and alternatives for the proposed anesthesia with the patient or authorized representative who has indicated his/her understanding and acceptance.     Plan Discussed with:  CRNA  Anesthesia Plan Comments:         Anesthesia Quick Evaluation

## 2018-01-19 NOTE — Consult Note (Signed)
Reason for Consult:AKI Referring Physician: Dr. Harvie Junior FANNIE GATHRIGHT is an 77 y.o. female.  HPI: 77 yr female with hx SBO, PAF, DVt (6/19), HTN long term , hypothyroid, renal stones (last 5/19), DJD, obesity, and cirrhosis now admitted with hematemesis, and found to have Cr that has risen 1.28 in mid 7/19 to 4 (stable this hosp). BPs on admit and at NH of residence 70s-90s.  Has been on Lasix/Spirono at [facility]. Notes less urine now.  Admitted with Urosepsis, Cdiff, and PAF 7/19. Notes progressive edema, wheezing, and cough in addition to N, V, poor appetite and difficulty swallowing..   No fevers but cold.  Has some but not severe abdm swelling.  Known cirrhosis for several yrs and had varices. Constitutional: as above, weak. Eyes: negative Ears, nose, mouth, throat, and face: negative Respiratory: cough with wheezing Cardiovascular: edema, ^ HR Gastrointestinal: as above,. on Laxatives Genitourinary:negative Integument/breast: negative Hematologic/lymphatic: anemia Musculoskeletal:knee arth, shots Neurological: negative Endocrine: hypothyroid Allergic/Immunologic: Amox, Cipro, Sulfa  Verapamil   Past Medical History:  Diagnosis Date  . Acute UTI   . Anemia   . Blood transfusion without reported diagnosis   . Cirrhosis ([place])   . Degenerative disk disease    knees  . Diverticulosis   . Dysrhythmia   . GERD (gastroesophageal reflux disease)   . Hemorrhoids   . History of kidney stones   . Hypertension   . Hypothyroidism   . Obesity   . Paroxysmal atrial fibrillation (H[facility])   . Partial small bowel obstruction ([place])   . Personal history of colonic polyps 03/25/2012   8 mm rectal adenoma 03/2012  . Portal hypertensive gastropathy ([place])   . Shingles   . Thyroid disease   . UTI (urinary tract infection) 12/13/2017  . Varices, esophageal ([place])   . Zoster     Past Surgical History:  Procedure Laterality Date  . ABDOMINAL HYSTERECTOMY    . APPENDECTOMY  1991  .  CHOLECYSTECTOMY  2000  . COLON SURGERY    . COLONOSCOPY    . CYSTOSCOPY     10-26-17 budyzn  . CYSTOSCOPY/URETEROSCOPY/HOLMIUM LASER/STENT PLACEMENT Left 10/26/2017   Procedure: CYSTOSCOPY, URETEROSCOPY/RETROGRADE/STENT PLACEMENT;  Surgeon: Nickie Retort, MD;  Location: [facility];  Service: Urology;  Laterality: Left;  NEEDS DIGITAL URETEROSCOPE  . CYSTOSCOPY/URETEROSCOPY/HOLMIUM LASER/STENT PLACEMENT Left 11/12/2017   Procedure: CYSTOSCOPY LEFT RETROGRADE PYELOGRAM LEFT URETEROSCOPY HOLMIUM LASER AND LEFT URETERAL STENT EXCHANGE AND LASER ENDO URETEROTOMY;  Surgeon: Ardis Hughs, MD;  Location: [facility];  Service: Urology;  Laterality: Left;  . ESOPHAGOGASTRODUODENOSCOPY  multiple  . ESOPHAGOGASTRODUODENOSCOPY N/A 12/16/2017   Procedure: ESOPHAGOGASTRODUODENOSCOPY (EGD);  Surgeon: Mauri Pole, MD;  Location: [facility] ENDOSCOPY;  Service: Endoscopy;  Laterality: N/A;    Family History  Problem Relation Age of Onset  . Heart failure Mother        died from  . Hypertension Mother   . Heart attack Father        died from  . Hypertension Sister     Social History:  reports that she has never smoked. She has never used smokeless tobacco. She reports that she does not drink alcohol or use drugs.  Allergies:  Allergies  Allergen Reactions  . Amoxicillin Palpitations    Has patient had a PCN reaction causing immediate rash, facial/tongue/throat swelling, SOB or lightheadedness with hypotension: No Has patient had a PCN reaction causing severe rash involving mucus membranes or skin necrosis: No Has patient had a PCN reaction that required hospitalization:  No Has patient had a PCN reaction occurring within the last 10 years: No If all of the above answers are "NO", then may proceed with Cephalosporin use.   . Ciprofloxacin Swelling  . Sulfonamide Derivatives Other (See Comments)    See little dots, skin feels like its burning  . Robaxin [Methocarbamol] Palpitations  . Verapamil  Palpitations    Medications:  I have reviewed the patient's current medications. Prior to Admission:  Medications Prior to Admission  Medication Sig Dispense Refill Last Dose  . alum & mag hydroxide-simeth (MAALOX PLUS) 400-400-40 MG/5ML suspension Take 15 mLs by mouth every 6 (six) hours as needed for indigestion.   unk at prn  . apixaban (ELIQUIS) 5 MG TABS tablet Take 1 tablet (5 mg total) by mouth 2 (two) times daily. 60 tablet 2 01/17/2018 at 09:00  . Cholecalciferol (VITAMIN D3) 50000 units CAPS Take 50,000 Units by mouth every Monday.   01/11/2018  . feeding supplement (ENSURE CLINICAL STRENGTH) LIQD Take 237 mLs by mouth 2 (two) times daily between meals.   01/17/2018 at Unknown time  . folic acid (FOLVITE) 1 MG tablet Take 1 tablet (1 mg total) by mouth daily. 30 tablet 2 01/17/2018 at Unknown time  . lactulose (CHRONULAC) 10 GM/15ML solution Take 30 g by mouth daily.   01/17/2018 at Unknown time  . levothyroxine (SYNTHROID, LEVOTHROID) 200 MCG tablet Take 200 mcg by mouth daily before breakfast.   01/17/2018 at Unknown time  . nystatin (MYCOSTATIN) 100000 UNIT/ML suspension Take 5 mLs by mouth 4 (four) times daily.   01/17/2018 at Unknown time  . ondansetron (ZOFRAN) 4 MG tablet Take 1 tablet (4 mg total) by mouth every 6 (six) hours as needed for nausea. 20 tablet 0 01/17/2018 at Unknown time  . saccharomyces boulardii (FLORASTOR) 250 MG capsule Take 1 capsule (250 mg total) by mouth 2 (two) times daily. 60 capsule 3 01/17/2018 at Unknown time  . spironolactone (ALDACTONE) 25 MG tablet Take 1 tablet (25 mg total) by mouth 2 (two) times daily. 60 tablet 2 01/17/2018 at Unknown time  . furosemide (LASIX) 40 MG tablet Take 1 tablet (40 mg total) by mouth 2 (two) times daily. (Patient not taking: Reported on 01/17/2018) 60 tablet 2 Not Taking at Unknown time  . levothyroxine (SYNTHROID) 150 MCG tablet Take 1 tablet (150 mcg total) by mouth daily. (Patient not taking: Reported on 01/17/2018) 30 tablet  11 Not Taking at Unknown time  . nitrofurantoin (MACRODANTIN) 100 MG capsule Take 100 mg by mouth 2 (two) times daily.   01/11/2018  . propranolol (INDERAL) 20 MG tablet Take 1 tablet (20 mg total) by mouth 2 (two) times daily. (Patient not taking: Reported on 01/17/2018) 60 tablet 5 Not Taking at Unknown time    Results for orders placed or performed during the hospital encounter of 01/17/18 (from the past 48 hour(s))  Comprehensive metabolic panel     Status: Abnormal   Collection Time: 01/17/18  2:36 PM  Result Value Ref Range   Sodium 134 (L) 135 - 145 mmol/L   Potassium 5.3 (H) 3.5 - 5.1 mmol/L   Chloride 103 98 - 111 mmol/L   CO2 22 22 - 32 mmol/L   Glucose, Bld 91 70 - 99 mg/dL   BUN 78 (H) 8 - 23 mg/dL   Creatinine, Ser 4.00 (H) 0.44 - 1.00 mg/dL   Calcium 9.9 8.9 - 10.3 mg/dL   Total Protein 5.9 (L) 6.5 - 8.1 g/dL   Albumin 2.1 (L)  3.5 - 5.0 g/dL   AST 68 (H) 15 - 41 U/L   ALT 38 0 - 44 U/L   Alkaline Phosphatase 85 38 - 126 U/L   Total Bilirubin 2.0 (H) 0.3 - 1.2 mg/dL   GFR calc non Af Amer 10 (L) >60 mL/min   GFR calc Af Amer 12 (L) >60 mL/min    Comment: (NOTE) The eGFR has been calculated using the CKD EPI equation. This calculation has not been validated in all clinical situations. eGFR's persistently <60 mL/min signify possible Chronic Kidney Disease.    Anion gap 9 5 - 15    Comment: Performed at [facility] [address], [place], [place] [address]  CBC     Status: Abnormal   Collection Time: 01/17/18  2:36 PM  Result Value Ref Range   WBC 11.2 (H) 4.0 - 10.5 K/uL   RBC 3.71 (L) 3.87 - 5.11 MIL/uL   Hemoglobin 11.0 (L) 12.0 - 15.0 g/dL   HCT 35.4 (L) 36.0 - 46.0 %   MCV 95.4 78.0 - 100.0 fL   MCH 29.6 26.0 - 34.0 pg   MCHC 31.1 30.0 - 36.0 g/dL   RDW 19.5 (H) 11.5 - 15.5 %   Platelets 619 (H) 150 - 400 K/uL    Comment: Performed at [facility] [address] [place], [place] [address]  Type and screen [place]      Status: None (Preliminary result)   Collection Time: 01/17/18  2:36 PM  Result Value Ref Range   ABO/RH(D) A POS    Antibody Screen NEG    Sample Expiration 01/20/2018    Unit Number [id number]    Blood Component Type RBC LR PHER2    Unit division 00    Status of Unit ISSUED    Transfusion Status OK TO TRANSFUSE    Crossmatch Result      Compatible Performed at [facility], [place] [address], [place], [place] [address]   Lipase, blood     Status: Abnormal   Collection Time: 01/17/18  2:36 PM  Result Value Ref Range   Lipase 80 (H) 11 - 51 U/L    Comment: Performed at [facility] [address] [place], [place] [address]  I-stat Chem 8, ED     Status: Abnormal   Collection Time: 01/17/18  2:56 PM  Result Value Ref Range   Sodium 134 (L) 135 - 145 mmol/L   Potassium 5.2 (H) 3.5 - 5.1 mmol/L   Chloride 102 98 - 111 mmol/L   BUN 73 (H) 8 - 23 mg/dL   Creatinine, Ser 4.30 (H) 0.44 - 1.00 mg/dL   Glucose, Bld 89 70 - 99 mg/dL   Calcium, Ion 1.23 1.15 - 1.40 mmol/L   TCO2 24 22 - 32 mmol/L   Hemoglobin 11.6 (L) 12.0 - 15.0 g/dL   HCT 34.0 (L) 36.0 - 46.0 %  Sodium, urine, random     Status: None   Collection Time: 01/17/18  9:45 PM  Result Value Ref Range   Sodium, Ur <10 mmol/L    Comment: Performed at [facility], [place] [address], [place], [place] [address]  Creatinine, urine, random     Status: None   Collection Time: 01/17/18  9:45 PM  Result Value Ref Range   Creatinine, Urine 134.55 mg/dL    Comment: Performed at [facility] [address] [place], [place] [address]  Osmolality,  urine     Status: None   Collection Time: 01/17/18 10:00 PM  Result Value Ref Range   Osmolality, Ur 399 300 - 900 mOsm/kg    Comment: Performed at [facility] [address], [place], [place] [address]  Lactic acid, plasma     Status: Abnormal   Collection Time: 01/17/18 10:11 PM  Result Value Ref Range   Lactic Acid, Venous 2.3 (HH) 0.5 - 1.9 mmol/L     Comment: CRITICAL RESULT CALLED TO, READ BACK BY AND VERIFIED WITH: Hoyt Koch RN 01/17/2018 2309 JORDANS Performed at [facility], [place] [address] [place], [place] [address]   Basic metabolic panel     Status: Abnormal   Collection Time: 01/17/18 10:11 PM  Result Value Ref Range   Sodium 135 135 - 145 mmol/L   Potassium 5.6 (H) 3.5 - 5.1 mmol/L   Chloride 105 98 - 111 mmol/L   CO2 17 (L) 22 - 32 mmol/L   Glucose, Bld 80 70 - 99 mg/dL   BUN 80 (H) 8 - 23 mg/dL   Creatinine, Ser 3.80 (H) 0.44 - 1.00 mg/dL   Calcium 9.7 8.9 - 10.3 mg/dL   GFR calc non Af Amer 11 (L) >60 mL/min   GFR calc Af Amer 12 (L) >60 mL/min    Comment: (NOTE) The eGFR has been calculated using the CKD EPI equation. This calculation has not been validated in all clinical situations. eGFR's persistently <60 mL/min signify possible Chronic Kidney Disease.    Anion gap 13 5 - 15    Comment: Performed at [facility] [address], [place], [place] [address]  Culture, blood (routine x 2)     Status: None (Preliminary result)   Collection Time: 01/17/18 10:15 PM  Result Value Ref Range   Specimen Description BLOOD LEFT WRIST    Special Requests      BOTTLES DRAWN AEROBIC AND ANAEROBIC Blood Culture results may not be optimal due to an inadequate volume of blood received in culture bottles   Culture      NO GROWTH 2 DAYS Performed at [facility] [address], [place], [place] [address]    Report Status PENDING   MRSA PCR Screening     Status: None   Collection Time: 01/17/18 11:16 PM  Result Value Ref Range   MRSA by PCR NEGATIVE NEGATIVE    Comment:        The GeneXpert MRSA Assay (FDA approved for NASAL specimens only), is one component of a comprehensive MRSA colonization surveillance program. It is not intended to diagnose MRSA infection nor to guide or monitor treatment for MRSA infections. Performed at [facility], [place] [address], [place], [place] [address]    Culture, blood (routine x 2)     Status: Abnormal (Preliminary result)   Collection Time: 01/17/18 11:18 PM  Result Value Ref Range   Specimen Description BLOOD RIGHT HAND    Special Requests      BOTTLES DRAWN AEROBIC ONLY Blood Culture adequate volume   Culture  Setup Time      AEROBIC BOTTLE ONLY GRAM POSITIVE COCCI CRITICAL RESULT CALLED TO, READ BACK BY AND VERIFIED WITHAsencion Islam [facility] 01/18/18 1858 JDW Performed at [facility], [place] [address], [place], [place] [address]    Culture STAPHYLOCOCCUS SPECIES (COAGULASE NEGATIVE) (A)    Report Status PENDING   Blood Culture ID Panel (Reflexed)     Status:  Abnormal   Collection Time: 01/17/18 11:18 PM  Result Value Ref Range   Enterococcus species NOT DETECTED NOT DETECTED   Listeria monocytogenes NOT DETECTED NOT DETECTED   Staphylococcus species DETECTED (A) NOT DETECTED    Comment: Methicillin (oxacillin) resistant coagulase negative staphylococcus. Possible blood culture contaminant (unless isolated from more than one blood culture draw or clinical case suggests pathogenicity). No antibiotic treatment is indicated for blood  culture contaminants. CRITICAL RESULT CALLED TO, READ BACK BY AND VERIFIED WITH: B MACHERIL PHARMD 01/18/18 1903 JDW    Staphylococcus aureus NOT DETECTED NOT DETECTED   Methicillin resistance DETECTED (A) NOT DETECTED    Comment: CRITICAL RESULT CALLED TO, READ BACK BY AND VERIFIED WITH: B MACHERIL PHARMD 01/18/18 1903 JDW    Streptococcus species NOT DETECTED NOT DETECTED   Streptococcus agalactiae NOT DETECTED NOT DETECTED   Streptococcus pneumoniae NOT DETECTED NOT DETECTED   Streptococcus pyogenes NOT DETECTED NOT DETECTED   Acinetobacter baumannii NOT DETECTED NOT DETECTED   Enterobacteriaceae species NOT DETECTED NOT DETECTED   Enterobacter cloacae complex NOT DETECTED NOT DETECTED   Escherichia coli NOT DETECTED NOT DETECTED   Klebsiella oxytoca NOT DETECTED NOT DETECTED   Klebsiella  pneumoniae NOT DETECTED NOT DETECTED   Proteus species NOT DETECTED NOT DETECTED   Serratia marcescens NOT DETECTED NOT DETECTED   Haemophilus influenzae NOT DETECTED NOT DETECTED   Neisseria meningitidis NOT DETECTED NOT DETECTED   Pseudomonas aeruginosa NOT DETECTED NOT DETECTED   Candida albicans NOT DETECTED NOT DETECTED   Candida glabrata NOT DETECTED NOT DETECTED   Candida krusei NOT DETECTED NOT DETECTED   Candida parapsilosis NOT DETECTED NOT DETECTED   Candida tropicalis NOT DETECTED NOT DETECTED    Comment: Performed at [facility] [address] [place], [place] [address]  Potassium     Status: Abnormal   Collection Time: 01/18/18  1:59 AM  Result Value Ref Range   Potassium 5.4 (H) 3.5 - 5.1 mmol/L    Comment: Performed at [facility] [address], [place], [place] [address]  Lactic acid, plasma     Status: Abnormal   Collection Time: 01/18/18  5:41 AM  Result Value Ref Range   Lactic Acid, Venous 2.8 (HH) 0.5 - 1.9 mmol/L    Comment: CRITICAL RESULT CALLED TO, READ BACK BY AND VERIFIED WITH: B MAY RN [phone] 01/18/2018 BY A BENNETT Performed at [facility], [place] [address] [place], [place] [address]   Comprehensive metabolic panel     Status: Abnormal   Collection Time: 01/18/18  5:42 AM  Result Value Ref Range   Sodium 137 135 - 145 mmol/L   Potassium 5.2 (H) 3.5 - 5.1 mmol/L   Chloride 104 98 - 111 mmol/L   CO2 20 (L) 22 - 32 mmol/L   Glucose, Bld 81 70 - 99 mg/dL   BUN 83 (H) 8 - 23 mg/dL   Creatinine, Ser 3.87 (H) 0.44 - 1.00 mg/dL   Calcium 9.7 8.9 - 10.3 mg/dL   Total Protein 5.2 (L) 6.5 - 8.1 g/dL   Albumin 1.9 (L) 3.5 - 5.0 g/dL   AST 58 (H) 15 - 41 U/L   ALT 33 0 - 44 U/L   Alkaline Phosphatase 75 38 - 126 U/L   Total Bilirubin 2.3 (H) 0.3 - 1.2 mg/dL   GFR calc non Af Amer 11 (L) >60 mL/min   GFR calc Af Amer 12 (L) >60 mL/min    Comment: (NOTE) The  eGFR has been calculated using the CKD EPI equation. This calculation has not  been validated in all clinical situations. eGFR's persistently <60 mL/min signify possible Chronic Kidney Disease.    Anion gap 13 5 - 15    Comment: Performed at [facility] [address], [place], [place] [address]  CBC     Status: Abnormal   Collection Time: 01/18/18  5:42 AM  Result Value Ref Range   WBC 12.2 (H) 4.0 - 10.5 K/uL   RBC 3.25 (L) 3.87 - 5.11 MIL/uL   Hemoglobin 9.9 (L) 12.0 - 15.0 g/dL   HCT 30.4 (L) 36.0 - 46.0 %   MCV 93.5 78.0 - 100.0 fL   MCH 30.5 26.0 - 34.0 pg   MCHC 32.6 30.0 - 36.0 g/dL   RDW 19.2 (H) 11.5 - 15.5 %   Platelets 619 (H) 150 - 400 K/uL    Comment: Performed at [facility], [place] [address] [place], [place] [address]  Occult blood card to lab, stool     Status: Abnormal   Collection Time: 01/18/18  8:47 AM  Result Value Ref Range   Fecal Occult Bld POSITIVE (A) NEGATIVE    Comment: Performed at [facility] [address] [place], [place] [address]  Culture, Urine     Status: Abnormal (Preliminary result)   Collection Time: 01/18/18  9:15 AM  Result Value Ref Range   Specimen Description URINE, CATHETERIZED    Special Requests      NONE Performed at [facility], [place] [address], [place], [place] [address]    Culture 30,000 COLONIES/mL GRAM NEGATIVE RODS (A)    Report Status PENDING   TSH     Status: Abnormal   Collection Time: 01/18/18  9:21 AM  Result Value Ref Range   TSH 42.291 (H) 0.350 - 4.500 uIU/mL    Comment: Performed by a 3rd Generation assay with a functional sensitivity of <=0.01 uIU/mL. Performed at [facility], [place] [address] [place], [place] [address]   Potassium     Status: Abnormal   Collection Time: 01/18/18  9:21 AM  Result Value Ref Range   Potassium 5.5 (H) 3.5 - 5.1 mmol/L    Comment: Performed at [facility] [address], [place], [place], [place] [address]  Lactic acid, plasma     Status: Abnormal   Collection Time: 01/18/18  9:21 AM  Result Value Ref Range   Lactic  Acid, Venous 2.9 (HH) 0.5 - 1.9 mmol/L    Comment: CRITICAL RESULT CALLED TO, READ BACK BY AND VERIFIED WITH: L.DUNN,RN 1013 01/18/18 CLARK,S Performed at [facility], [place] [address] [place], [place] [address]   Potassium     Status: None   Collection Time: 01/18/18 12:20 PM  Result Value Ref Range   Potassium 4.7 3.5 - 5.1 mmol/L    Comment: Performed at [facility] [address] [place], [place] [address]  T4, free     Status: None   Collection Time: 01/18/18 12:20 PM  Result Value Ref Range   Free T4 0.85 0.82 - 1.77 ng/dL    Comment: (NOTE) Biotin ingestion may interfere with free T4 tests. If the results are inconsistent with the TSH level, previous test results, or the clinical presentation, then consider biotin interference. If needed, order repeat testing after stopping biotin. Performed at [facility], [place] [address] [place],  [address]   Basic  metabolic panel     Status: Abnormal   Collection Time: 01/18/18  4:46 PM  Result Value Ref Range   Sodium 133 (L) 135 - 145 mmol/L   Potassium 5.2 (H) 3.5 - 5.1 mmol/L    Comment: HEMOLYSIS AT THIS LEVEL MAY AFFECT RESULT   Chloride 102 98 - 111 mmol/L   CO2 19 (L) 22 - 32 mmol/L   Glucose, Bld 86 70 - 99 mg/dL   BUN 84 (H) 8 - 23 mg/dL   Creatinine, Ser 3.79 (H) 0.44 - 1.00 mg/dL   Calcium 9.1 8.9 - 10.3 mg/dL   GFR calc non Af Amer 11 (L) >60 mL/min   GFR calc Af Amer 13 (L) >60 mL/min    Comment: (NOTE) The eGFR has been calculated using the CKD EPI equation. This calculation has not been validated in all clinical situations. eGFR's persistently <60 mL/min signify possible Chronic Kidney Disease.    Anion gap 12 5 - 15    Comment: Performed at [facility] [address] [place], [place] [address]  Lactic acid, plasma     Status: Abnormal   Collection Time: 01/18/18  4:46 PM  Result Value Ref Range   Lactic Acid, Venous 2.4 (HH) 0.5 - 1.9 mmol/L    Comment: CRITICAL RESULT  CALLED TO, READ BACK BY AND VERIFIED WITH: Ilda Mori 1758 01/18/2018 WBOND Performed at [facility], [place] [address], [place], [place] [address]   Hemoglobin and hematocrit, blood     Status: Abnormal   Collection Time: 01/18/18  8:08 PM  Result Value Ref Range   Hemoglobin 11.2 (L) 12.0 - 15.0 g/dL   HCT 33.5 (L) 36.0 - 46.0 %    Comment: Performed at [facility], [place] [address] [place], [place] [address]  Protime-INR     Status: Abnormal   Collection Time: 01/19/18  4:37 AM  Result Value Ref Range   Prothrombin Time 28.3 (H) 11.4 - 15.2 seconds    Comment: QUESTIONABLE RESULTS, RECOMMEND RECOLLECT TO VERIFY NOTIFIED G. MILLER RN [phone] 01/19/2018 BY MACEDA,J.    INR 2.68     Comment: QUESTIONABLE RESULTS, RECOMMEND RECOLLECT TO VERIFY NOTIFIED G. MILLER, RN 524/01/20/2019 BY MACEDA,J. Performed at [facility], [place] [address], [place], [place] [address]   Lactic acid, plasma     Status: None   Collection Time: 01/19/18  4:37 AM  Result Value Ref Range   Lactic Acid, Venous 1.6 0.5 - 1.9 mmol/L    Comment: Performed at [facility] [address] [place], [place] [address]  CBC     Status: Abnormal   Collection Time: 01/19/18  7:05 AM  Result Value Ref Range   WBC 6.1 4.0 - 10.5 K/uL   RBC 2.16 (L) 3.87 - 5.11 MIL/uL   Hemoglobin 6.6 (LL) 12.0 - 15.0 g/dL    Comment: RESULTS VERIFIED VIA RECOLLECT REPEATED TO VERIFY CRITICAL RESULT CALLED TO, READ BACK BY AND VERIFIED WITH: Alphia Kava, RN [phone] 01/19/2018 BY MACEDA,J.    HCT 20.4 (L) 36.0 - 46.0 %   MCV 94.4 78.0 - 100.0 fL   MCH 30.6 26.0 - 34.0 pg   MCHC 32.4 30.0 - 36.0 g/dL   RDW 18.9 (H) 11.5 - 15.5 %   Platelets 352 150 - 400 K/uL    Comment: Performed at [facility], [place] [address], [place], [place] [address]  Comprehensive metabolic panel     Status: Abnormal  Collection Time: 01/19/18  7:05 AM  Result Value Ref Range   Sodium 135 135 - 145 mmol/L   Potassium 4.5 3.5 - 5.1  mmol/L   Chloride 102 98 - 111 mmol/L   CO2 21 (L) 22 - 32 mmol/L   Glucose, Bld 89 70 - 99 mg/dL   BUN 85 (H) 8 - 23 mg/dL   Creatinine, Ser 4.05 (H) 0.44 - 1.00 mg/dL   Calcium 9.4 8.9 - 10.3 mg/dL   Total Protein 5.1 (L) 6.5 - 8.1 g/dL   Albumin 3.1 (L) 3.5 - 5.0 g/dL   AST 44 (H) 15 - 41 U/L   ALT 25 0 - 44 U/L   Alkaline Phosphatase 44 38 - 126 U/L   Total Bilirubin 2.6 (H) 0.3 - 1.2 mg/dL   GFR calc non Af Amer 10 (L) >60 mL/min   GFR calc Af Amer 12 (L) >60 mL/min    Comment: (NOTE) The eGFR has been calculated using the CKD EPI equation. This calculation has not been validated in all clinical situations. eGFR's persistently <60 mL/min signify possible Chronic Kidney Disease.    Anion gap 12 5 - 15    Comment: Performed at [facility] [address], [place], [place] [address]  Magnesium     Status: Abnormal   Collection Time: 01/19/18  7:05 AM  Result Value Ref Range   Magnesium 2.6 (H) 1.7 - 2.4 mg/dL    Comment: Performed at [facility] [address], [place], [place] [address]  Protime-INR     Status: Abnormal   Collection Time: 01/19/18  7:05 AM  Result Value Ref Range   Prothrombin Time 27.1 (H) 11.4 - 15.2 seconds   INR 2.54     Comment: Performed at [facility] [address], [place], [place] [address]  Prepare RBC     Status: None   Collection Time: 01/19/18  8:42 AM  Result Value Ref Range   Order Confirmation      ORDER PROCESSED BY BLOOD BANK Performed at [facility], [place] [address] [place], [place] [address]     Ct Abdomen Pelvis Wo Contrast  Result Date: 01/17/2018 CLINICAL DATA:  Coffee ground emesis with history of esophageal varices EXAM: CT ABDOMEN AND PELVIS WITHOUT CONTRAST TECHNIQUE: Multidetector CT imaging of the abdomen and pelvis was performed following the standard protocol without IV contrast. COMPARISON:  12/13/2017 FINDINGS: Lower chest: Bilateral pleural effusions are noted right considerably greater  than left similar to that seen on the prior exam with underlying lower lobe atelectatic changes. Hepatobiliary: The liver is small and nodular in appearance consistent with underlying cirrhotic change. Stable hypodense lesions are noted in the posterior aspect of the right lobe of the liver as well as the lateral segment of the left lobe of the liver. The gallbladder has been surgically removed. Pancreas: Unremarkable. No pancreatic ductal dilatation or surrounding inflammatory changes. Spleen: Normal in size without focal abnormality. The previously seen splenomegaly is not well appreciated on today's exam. Adrenals/Urinary Tract: The adrenal glands are within normal limits. Tiny nonobstructing stone is now seen in the lower pole of the left kidney. No obstructive changes are noted. Persistent distal left ureteral stone is noted stable from the previous exam. The bladder is decompressed. Stomach/Bowel: The appendix has been surgically removed. Mild retained fecal material is noted although no constipation is seen. Some wall thickening in the small bowel is noted likely related to the underlying ascites.  Postsurgical changes in the right lower quadrant are noted. No definitive obstructive changes seen. Vascular/Lymphatic: Aortic atherosclerosis. No enlarged abdominal or pelvic lymph nodes. Reproductive: Status post hysterectomy. No adnexal masses. Other: Moderate ascites is noted slightly increased when compared with the prior exam. Changes of anasarca are noted. These have increased in the interval from the prior exam. Musculoskeletal: Degenerative changes of the lumbar spine are noted. IMPRESSION: Changes consistent with cirrhosis of the liver with stable hypodense lesions in both the right and left lobes as well as changes of ascites. The known esophageal varices are not well appreciated on this exam. Increasing changes of anasarca when compared with the prior study. Bilateral pleural effusions with associated  lower lobe atelectasis/infiltrate. This is stable from the prior exam. Distal left ureteral stone which has migrated slightly in the interval from the prior exam. A tiny lower pole left renal stone is noted. Diffuse small bowel wall thickening likely related to the underlying ascites Electronically Signed   By: Inez Catalina M.D.   On: 01/17/2018 20:05   Dg Chest Port 1 View  Result Date: 01/17/2018 CLINICAL DATA:  Per ED notes, pt brought in for coffee ground emesis since this am. Pt has hx of afib- is on eliquis, pt also has hx of esophageal varices. Pt endorses SOB with exertion. Per EMS pt's abdomen is rigid d/t subcutaneous fluid admin at facility. Pt is not eating, states even the sight of food makes her nauseous. Pt denies any pain. Hx of HTN. Non-smoker EXAM: PORTABLE CHEST 1 VIEW COMPARISON:  12/13/2017 FINDINGS: Cardiac silhouette is mildly enlarged. No mediastinal or hilar masses. There are prominent bronchovascular markings. There is hazy opacity in the lung bases consistent with small pleural effusions. No convincing pneumonia. No pneumothorax. Skeletal structures are grossly intact. IMPRESSION: 1. Mild cardiomegaly with small pleural effusions and prominent bronchovascular markings. The findings are similar to the prior study. Mild congestive heart failure is suspected given the symptoms of shortness of breath. Electronically Signed   By: Lajean Manes M.D.   On: 01/17/2018 15:29    ROS Blood pressure 101/60, pulse 86, temperature (!) 97.5 F (36.4 C), temperature source Oral, resp. rate (!) 22, height 5' 5"  (1.651 m), weight 110 kg, SpO2 95 %. Physical Exam Physical Examination: General appearance - obese, swollen, pale,  Mental status - alert, oriented to person, place, and time, some memory issues Eyes - pupils equal and reactive, extraocular eye movements intact, funduscopic exam normal, discs flat and sharp, icteric Mouth - mucous membranes moist, pharynx normal without lesions Neck  - PCL Lymphatics - no palpable lymphadenopathy, no hepatosplenomegaly, posterior cervical nodes Chest - wheezing noted bilat, rales noted bibasilar Heart - irregularly irregular rhythm with rate 67-34L, systolic murmur Gr 2/6 at apex Abdomen - obese, mod distension, pos FW , large midline incision Extremities - pedal edema 4 + Skin - pale, thin, jaundice mild  Assessment/Plan: 1 AKI most likely HRS. This did not start 2 d ago , most likely longer.  Vol xs , inability to excrete Na and water, Mild acidemia and ? Early uremia.   Has stone in one ureter but not obstructed on CT. Agree with current therapy ie Midodrine, alb, octreotide.  Give 1 dose Furosemide for comfort.  Wants to go to Hospice if not better.  Has Hematuria/pyuria on chronic basis, suspect related to stone. 2 Cirrhosis NASH 3 Hypertension: not an issue 4. Anemia not overly concerning, follow 5. Obesity 6 Hypothyroid P Mido, octreo, alb, comfort measures  Jeneen Rinks [address] 01/19/2018, 1:14 PM

## 2018-01-19 NOTE — Progress Notes (Signed)
Report received from [place], [place] with Endo at this time.

## 2018-01-19 NOTE — Anesthesia Postprocedure Evaluation (Signed)
Anesthesia Post Note  Patient: Brittney Valentine  Procedure(s) Performed: ESOPHAGOGASTRODUODENOSCOPY (EGD) WITH PROPOFOL (N/A )     Patient location during evaluation: Endoscopy Anesthesia Type: MAC Level of consciousness: awake and alert Pain management: pain level controlled Vital Signs Assessment: post-procedure vital signs reviewed and stable Respiratory status: spontaneous breathing, nonlabored ventilation, respiratory function stable and patient connected to nasal cannula oxygen Cardiovascular status: stable Postop Assessment: no apparent nausea or vomiting Anesthetic complications: no    Last Vitals:  Vitals:   01/19/18 1408 01/19/18 1515  BP: 104/66 (!) 103/53  Pulse: 73 65  Resp: 18 16  Temp: 36.5 C 36.5 C  SpO2: 96% 97%    Last Pain:  Vitals:   01/19/18 1515  TempSrc: Oral  PainSc:                  Chelsey L Woodrum

## 2018-01-19 NOTE — Consult Note (Addendum)
Patient name: Brittney Valentine MRN: [id number] DOB: 08-28-1940 Sex: female   REASON FOR CONSULT:    To evaluate for an IVC filter.  The consult is requested by Dr. Florene Glen.  HPI:   Brittney Valentine is a pleasant 77 y.o. female, who was admitted on 01/17/2018 with acute kidney injury with hyperkalemia and also coffee-ground emesis.  Patient has a complicated medical history.  She has a history of cirrhosis, chronic kidney disease, congestive heart failure, atrial fibrillation, and a recent DVT of the left lower extremity which was diagnosed in June of this year.  The patient was hospitalized in July 2019 with multiple medical conditions including bacteremia and a UTI.  Patient also had C. difficile colitis.  She was discharged to a skilled nursing facility after this admission in order to optimize her functional status.  At the skilled nursing facility, she reported an episode of emesis and it was thought that she might have coffee-ground emesis.  On my history it was not clear to her if there was any blood in her emesis.  She denies any melena.  However given that she might have a GI bleed and has a history of DVT vascular surgery was consulted to consider possible IVC filter placement.  The patient just got back from endoscopy.  Past Medical History:  Diagnosis Date  . Acute UTI   . Anemia   . Blood transfusion without reported diagnosis   . Cirrhosis ([place])   . Degenerative disk disease    knees  . Diverticulosis   . Dysrhythmia   . GERD (gastroesophageal reflux disease)   . Hemorrhoids   . History of kidney stones   . Hypertension   . Hypothyroidism   . Obesity   . Paroxysmal atrial fibrillation ([facility])   . Partial small bowel obstruction ([place])   . Personal history of colonic polyps 03/25/2012   8 mm rectal adenoma 03/2012  . Portal hypertensive gastropathy ([place])   . Shingles   . Thyroid disease   . UTI (urinary tract infection) 12/13/2017  . Varices, esophageal ([place])   .  Zoster     Family History  Problem Relation Age of Onset  . Heart failure Mother        died from  . Hypertension Mother   . Heart attack Father        died from  . Hypertension Sister     SOCIAL HISTORY: Social History   Socioeconomic History  . Marital status: Single    Spouse name: Not on file  . Number of children: Not on file  . Years of education: Not on file  . Highest education level: Not on file  Occupational History  . Not on file  Social Needs  . Financial resource strain: Not on file  . Food insecurity:    Worry: Not on file    Inability: Not on file  . Transportation needs:    Medical: Not on file    Non-medical: Not on file  Tobacco Use  . Smoking status: Never Smoker  . Smokeless tobacco: Never Used  Substance and Sexual Activity  . Alcohol use: No  . Drug use: No  . Sexual activity: Not Currently    Partners: Male  Lifestyle  . Physical activity:    Days per week: Not on file    Minutes per session: Not on file  . Stress: Not on file  Relationships  . Social connections:    Talks on phone: Not  on file    Gets together: Not on file    Attends religious service: Not on file    Active member of club or organization: Not on file    Attends meetings of clubs or organizations: Not on file    Relationship status: Not on file  . Intimate partner violence:    Fear of current or ex partner: Not on file    Emotionally abused: Not on file    Physically abused: Not on file    Forced sexual activity: Not on file  Other Topics Concern  . Not on file  Social History Narrative  . Not on file    Allergies  Allergen Reactions  . Amoxicillin Palpitations    Has patient had a PCN reaction causing immediate rash, facial/tongue/throat swelling, SOB or lightheadedness with hypotension: No Has patient had a PCN reaction causing severe rash involving mucus membranes or skin necrosis: No Has patient had a PCN reaction that required hospitalization: No Has  patient had a PCN reaction occurring within the last 10 years: No If all of the above answers are "NO", then may proceed with Cephalosporin use.   . Ciprofloxacin Swelling  . Sulfonamide Derivatives Other (See Comments)    See little dots, skin feels like its burning  . Robaxin [Methocarbamol] Palpitations  . Verapamil Palpitations    Current Facility-Administered Medications  Medication Dose Route Frequency Provider Last Rate Last Dose  . albumin human 25 % solution 100 g  100 g Intravenous q1800 Esterwood, Amy S, PA-C 50 mL/hr at 01/18/18 1857 100 g at 01/18/18 1857  . cefTRIAXone (ROCEPHIN) 2 g in sodium chloride 0.9 % 100 mL IVPB  2 g Intravenous Q24H Elodia Florence., MD 200 mL/hr at 01/19/18 1308 2 g at 01/19/18 1308  . diltiazem (CARDIZEM) 100 mg in dextrose 5% 17mL (1 mg/mL) infusion  5-15 mg/hr Intravenous Continuous Vilma Prader, MD 5 mL/hr at 01/19/18 1442 5 mg/hr at 01/19/18 1442  . ipratropium-albuterol (DUONEB) 0.5-2.5 (3) MG/3ML nebulizer solution 3 mL  3 mL Nebulization Q6H PRN Elodia Florence., MD      . levothyroxine (SYNTHROID, LEVOTHROID) tablet 200 mcg  200 mcg Oral QAC breakfast Vilma Prader, MD   200 mcg at 01/19/18 0809  . midodrine (PROAMATINE) tablet 10 mg  10 mg Oral TID WC Elodia Florence., MD   10 mg at 01/19/18 1150  . octreotide (SANDOSTATIN) 500 mcg in sodium chloride 0.9 % 250 mL (2 mcg/mL) infusion  50 mcg/hr Intravenous Continuous Esterwood, Amy S, PA-C 25 mL/hr at 01/19/18 1442 50 mcg/hr at 01/19/18 1442  . ondansetron (ZOFRAN-ODT) disintegrating tablet 4 mg  4 mg Oral Q6H PRN Vilma Prader, MD      . pantoprazole (PROTONIX) injection 40 mg  40 mg Intravenous BID Esterwood, Amy S, PA-C   40 mg at 01/19/18 1151  . phytonadione (VITAMIN K) 1 mg in dextrose 5 % 50 mL IVPB  1 mg Intravenous Once Elodia Florence., MD      . sodium chloride flush (NS) 0.9 % injection 3 mL  3 mL Intravenous Q12H Vilma Prader, MD   3 mL  at 01/18/18 2053    REVIEW OF SYSTEMS:  [X]  denotes positive finding, [ ]  denotes negative finding Cardiac  Comments:  Chest pain or chest pressure:    Shortness of breath upon exertion: x   Short of breath when lying flat:    Irregular heart rhythm:  Vascular    Pain in calf, thigh, or hip brought on by ambulation:    Pain in feet at night that wakes you up from your sleep:     Blood clot in your veins:    Leg swelling:  x  bilateral.  Left greater than right.      Pulmonary    Oxygen at home:    Productive cough:     Wheezing:         Neurologic    Sudden weakness in arms or legs:     Sudden numbness in arms or legs:     Sudden onset of difficulty speaking or slurred speech:    Temporary loss of vision in one eye:     Problems with dizziness:         Gastrointestinal    Blood in stool:     Vomited blood:  x       Genitourinary    Burning when urinating:     Blood in urine:        Psychiatric    Major depression:         Hematologic    Bleeding problems:    Problems with blood clotting too easily:        Skin    Rashes or ulcers:        Constitutional    Fever or chills:     PHYSICAL EXAM:   Vitals:   01/19/18 1408 01/19/18 1515 01/19/18 1525 01/19/18 1537  BP: 104/66 (!) 103/53 118/62 (!) 101/38  Pulse: 73 65 68 69  Resp: 18 16 17 17   Temp: 97.7 F (36.5 C) 97.7 F (36.5 C)    TempSrc: Oral Oral    SpO2: 96% 97% 99% 96%  Weight: 110 kg     Height: 5\' 5"  (1.651 m)      GENERAL: The patient is a well-nourished female, in no acute distress. The vital signs are documented above. CARDIAC: There is a regular rate and rhythm.  VASCULAR: I do not detect carotid bruits. She has bilateral lower extremity swelling which is more significant on the left side. I am unable to palpate pulses because of the swelling.  However both feet appear adequately perfused. PULMONARY: There is good air exchange bilaterally without wheezing or rales. ABDOMEN: She is  morbidly obese and her abdomen is difficult to assess. MUSCULOSKELETAL: There are no major deformities or cyanosis. NEUROLOGIC: No focal weakness or paresthesias are detected. SKIN: There are no ulcers or rashes noted. PSYCHIATRIC: The patient has a normal affect.  DATA:    VENOUS DUPLEX: I reviewed the venous duplex scan from 12/04/2017.  She had a DVT involving the left femoral vein, popliteal vein, and tibial veins.  ECHO: I reviewed the echo from 12/06/2017.  She had mild left ventricular hypertrophy.  Systolic function was normal.  Ejection fraction was in the range of 55 to 60%.  LABS: Her creatinine is 4.2.  Her GFR is 10. INR today was 2.54. Most recent hemoglobin is 9.2.   UPPER GI: The patient underwent endoscopy today which showed: Grade II varices were found in the distal esophagus. The larger varix had a superficial ulceration. The varices were 6 mm in largest diameter. Three bands (2 on the larger, 1 on the smaller) were successfully placed with complete eradication, resulting in deflation of varices. There was no bleeding during, and at the end, of the procedure.  MEDICAL ISSUES:   HISTORY OF LEFT LOWER EXTREMITY DVT WITH POSSIBLE  UPPER GI BLEED: This patient was diagnosed with a left lower extremity DVT on 12/04/2017.  She is on Eliquis, however she has been on Eliquis because of her atrial fibrillation.  This was not specifically started for her DVT.  Given that there was no active bleeding and the larger varix was addressed today during endoscopy, if GI agrees, I think it would be reasonable for her to continue anticoagulation carefully.  If GI feels that she is not a candidate for anticoagulation then I think it would be reasonable to consider an IVC filter.  However given her renal failure we would try to do this without any contrast. This would be challenging given her obesity.  In addition she does have a history of cirrhosis and would be at increased risk for bleeding.   Currently her INR is 2.54.  I discussed with her the potential advantages of placing an IVC filter and also the potential complications.  Currently she tells me that given her multiple medical problems she is actually being considered for hospice and is reluctant to consider another invasive procedure at this point which I think is reasonable.  Deitra Mayo [facility] [phone]

## 2018-01-19 NOTE — Progress Notes (Signed)
Initial Nutrition Assessment  DOCUMENTATION CODES:   Morbid obesity  INTERVENTION:    Advance diet as medically appropriate; RD to add supplements once/as able  NUTRITION DIAGNOSIS:   Increased nutrient needs related to acute illness as evidenced by estimated needs  GOAL:   Patient will meet greater than or equal to 90% of their needs  MONITOR:   Diet advancement, PO intake, Supplement acceptance, Labs, Skin, Weight trends, I & O's  REASON FOR ASSESSMENT:   Consult Assessment of nutrition requirement/status  ASSESSMENT:   77 yo Female with PMH of cirrhosis, anemia, GERD and esophageal varices; admitted from SNF for coffee-ground emesis and concern for GI bleed.  Pt admitted with AKI, epigastric pain and GI bleed. RD unable to obtain nutrition hx. Pt is currently in ENDOSCOPY. Prior to NPO status on a Renal diet. PO intake poor at 25% per flowsheets.  GI note reviewed. Pt for EGD today. Per PTA medication list pt takes Ensure Enlive supplements. Per provider notes, pt has not been eating or drinking well for several days.  Per readings below, pt's wt highly fluctuates. Likely due to fluid gains/losses with chronic illness. Pt would benefit from addition of oral nutrition supplements (ONS) during acute stay. Labs and medications reviewed. Na 134 (L). BUN 82 (H).  NUTRITION - FOCUSED PHYSICAL EXAM:  Unable to complete at this time.  Diet Order:   Diet Order            Diet clear liquid Room service appropriate? Yes; Fluid consistency: Thin  Diet effective now        Diet NPO time specified  Diet effective now             EDUCATION NEEDS:   Not appropriate for education at this time  Skin:  Skin Assessment: Reviewed RN Assessment  Last BM:  8/12  Height:   Ht Readings from Last 1 Encounters:  01/19/18 5\' 5"  (1.651 m)   Weight:   Wt Readings from Last 1 Encounters:  01/19/18 110 kg   Wt Readings from Last 10 Encounters:  01/19/18 110 kg   12/29/17 95.7 kg  12/24/17 103.8 kg  12/05/17 99.9 kg  12/03/17 96.6 kg  12/03/17 97 kg  11/25/17 93.4 kg  11/25/17 93.8 kg  11/12/17 94.4 kg  10/29/17 94.3 kg   Ideal Body Weight:  56.8 kg  BMI:  Body mass index is 40.36 kg/m.  Estimated Nutritional Needs:   Kcal:  1800-2000  Protein:  90-105 gm  Fluid:  1.8-2.0 L  Arthur Holms, RD, LDN Pager #: [phone] After-Hours Pager #: [phone]

## 2018-01-19 NOTE — Progress Notes (Signed)
Patient back to room at this time. Assessment and v/s done. Patient sleeping. Will continue to monitor.

## 2018-01-19 NOTE — Interval H&P Note (Signed)
History and Physical Interval Note:  01/19/2018 2:30 PM  Brittney Valentine  has presented today for surgery, with the diagnosis of GI bleed  The various methods of treatment have been discussed with the patient and family. After consideration of risks, benefits and other options for treatment, the patient has consented to  Procedure(s): ESOPHAGOGASTRODUODENOSCOPY (EGD) WITH PROPOFOL (N/A) as a surgical intervention .  The patient's history has been reviewed, patient examined, no change in status, stable for surgery.  I have reviewed the patient's chart and labs.  Questions were answered to the patient's satisfaction.     Pricilla Riffle. [place] Plan

## 2018-01-19 NOTE — H&P (View-Only) (Signed)
Patient ID: Brittney Valentine, female   DOB: 1940/11/11, 77 y.o.   MRN: [id number]    Progress Note   Subjective   Feels SOB, wheezing a little - says she does that at times. No emesis, had one BM during night according to nurses /black  Being transfused one unit currently   Objective   Vital signs in last 24 hours: Temp:  [97.4 F (36.3 C)-98.1 F (36.7 C)] 97.4 F (36.3 C) (08/13 1006) Pulse Rate:  [66-103] 86 (08/13 1006) Resp:  [15-24] 22 (08/13 1006) BP: (84-111)/(38-93) 101/60 (08/13 1006) SpO2:  [93 %-100 %] 95 % (08/13 1006) Weight:  [110 kg] 110 kg (08/13 0353) Last BM Date: 01/18/18 General:  Ill appearing elderly  white female in NAD audible wheezing/squeak Heart:  irregular rate and rhythm; no murmurs Lungs: Respirations even decreased BS bilaterally, with rales up 2/3 bilat Abdomen:  Soft, nontender , BS+ , large, no fluid wave , extensive soft tissue edema in lower abdomen Extremities:  2+ edema above knees. Neurologic:  Alert and oriented,  grossly normal neurologically. Psych:  Cooperative. Normal mood and affect.  Intake/Output from previous day: 08/12 0701 - 08/13 0700 In: 2159.5 [P.O.:600; I.V.:1109.5; IV Piggyback:450] Out: 335 [Urine:335] Intake/Output this shift: Total I/O In: 250 [I.V.:250] Out: -   Lab Results: Recent Labs    01/17/18 1436  01/18/18 0542 01/18/18 2008 01/19/18 0705  WBC 11.2*  --  12.2*  --  6.1  HGB 11.0*   < > 9.9* 11.2* 6.6*  HCT 35.4*   < > 30.4* 33.5* 20.4*  PLT 619*  --  619*  --  352   < > = values in this interval not displayed.   BMET Recent Labs    01/18/18 0542  01/18/18 1220 01/18/18 1646 01/19/18 0705  NA 137  --   --  133* 135  K 5.2*   < > 4.7 5.2* 4.5  CL 104  --   --  102 102  CO2 20*  --   --  19* 21*  GLUCOSE 81  --   --  86 89  BUN 83*  --   --  84* 85*  CREATININE 3.87*  --   --  3.79* 4.05*  CALCIUM 9.7  --   --  9.1 9.4   < > = values in this interval not displayed.   LFT Recent Labs   01/19/18 0705  PROT 5.1*  ALBUMIN 3.1*  AST 44*  ALT 25  ALKPHOS 44  BILITOT 2.6*   PT/INR Recent Labs    01/19/18 0437 01/19/18 0705  LABPROT 28.3* 27.1*  INR 2.68 2.54    Studies/Results: Ct Abdomen Pelvis Wo Contrast  Result Date: 01/17/2018 CLINICAL DATA:  Coffee ground emesis with history of esophageal varices EXAM: CT ABDOMEN AND PELVIS WITHOUT CONTRAST TECHNIQUE: Multidetector CT imaging of the abdomen and pelvis was performed following the standard protocol without IV contrast. COMPARISON:  12/13/2017 FINDINGS: Lower chest: Bilateral pleural effusions are noted right considerably greater than left similar to that seen on the prior exam with underlying lower lobe atelectatic changes. Hepatobiliary: The liver is small and nodular in appearance consistent with underlying cirrhotic change. Stable hypodense lesions are noted in the posterior aspect of the right lobe of the liver as well as the lateral segment of the left lobe of the liver. The gallbladder has been surgically removed. Pancreas: Unremarkable. No pancreatic ductal dilatation or surrounding inflammatory changes. Spleen: Normal in size without focal abnormality. The  previously seen splenomegaly is not well appreciated on today's exam. Adrenals/Urinary Tract: The adrenal glands are within normal limits. Tiny nonobstructing stone is now seen in the lower pole of the left kidney. No obstructive changes are noted. Persistent distal left ureteral stone is noted stable from the previous exam. The bladder is decompressed. Stomach/Bowel: The appendix has been surgically removed. Mild retained fecal material is noted although no constipation is seen. Some wall thickening in the small bowel is noted likely related to the underlying ascites. Postsurgical changes in the right lower quadrant are noted. No definitive obstructive changes seen. Vascular/Lymphatic: Aortic atherosclerosis. No enlarged abdominal or pelvic lymph nodes. Reproductive:  Status post hysterectomy. No adnexal masses. Other: Moderate ascites is noted slightly increased when compared with the prior exam. Changes of anasarca are noted. These have increased in the interval from the prior exam. Musculoskeletal: Degenerative changes of the lumbar spine are noted. IMPRESSION: Changes consistent with cirrhosis of the liver with stable hypodense lesions in both the right and left lobes as well as changes of ascites. The known esophageal varices are not well appreciated on this exam. Increasing changes of anasarca when compared with the prior study. Bilateral pleural effusions with associated lower lobe atelectasis/infiltrate. This is stable from the prior exam. Distal left ureteral stone which has migrated slightly in the interval from the prior exam. A tiny lower pole left renal stone is noted. Diffuse small bowel wall thickening likely related to the underlying ascites Electronically Signed   By: Inez Catalina M.D.   On: 01/17/2018 20:05   Dg Chest Port 1 View  Result Date: 01/17/2018 CLINICAL DATA:  Per ED notes, pt brought in for coffee ground emesis since this am. Pt has hx of afib- is on eliquis, pt also has hx of esophageal varices. Pt endorses SOB with exertion. Per EMS pt's abdomen is rigid d/t subcutaneous fluid admin at facility. Pt is not eating, states even the sight of food makes her nauseous. Pt denies any pain. Hx of HTN. Non-smoker EXAM: PORTABLE CHEST 1 VIEW COMPARISON:  12/13/2017 FINDINGS: Cardiac silhouette is mildly enlarged. No mediastinal or hilar masses. There are prominent bronchovascular markings. There is hazy opacity in the lung bases consistent with small pleural effusions. No convincing pneumonia. No pneumothorax. Skeletal structures are grossly intact. IMPRESSION: 1. Mild cardiomegaly with small pleural effusions and prominent bronchovascular markings. The findings are similar to the prior study. Mild congestive heart failure is suspected given the symptoms  of shortness of breath. Electronically Signed   By: Lajean Manes M.D.   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 Only one BM overnight but hgb dropped almost 5 g overnight.  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 need for filter per primary service  Plan:  Transfuse to keep Hb 7-8 Continue IV PPI BID Continue Octreotide  Albumin ordered for  today - second day  EGD this afternoon with MAC with Dr. Fuller Plan Discussed decline in status with nephew    LOS: 2 days   [name] PA-C 01/19/2018, 10:30 AM      Attending physician's note   I have taken an interval history, reviewed the chart and examined the patient. I agree with the Advanced Practitioner's note, impression and recommendations.   Lucio Edward, MD FACG [phone] office

## 2018-01-20 ENCOUNTER — Inpatient Hospital Stay (HOSPITAL_COMMUNITY): Payer: PPO

## 2018-01-20 ENCOUNTER — Other Ambulatory Visit: Payer: Self-pay | Admitting: *Deleted

## 2018-01-20 DIAGNOSIS — E875 Hyperkalemia: Secondary | ICD-10-CM

## 2018-01-20 DIAGNOSIS — Z515 Encounter for palliative care: Secondary | ICD-10-CM

## 2018-01-20 DIAGNOSIS — R1013 Epigastric pain: Secondary | ICD-10-CM

## 2018-01-20 DIAGNOSIS — R0602 Shortness of breath: Secondary | ICD-10-CM

## 2018-01-20 DIAGNOSIS — R079 Chest pain, unspecified: Secondary | ICD-10-CM

## 2018-01-20 LAB — BPAM RBC
BLOOD PRODUCT EXPIRATION DATE: 201908152359
ISSUE DATE / TIME: 201908130942
Unit Type and Rh: 600

## 2018-01-20 LAB — TYPE AND SCREEN
ABO/RH(D): A POS
Antibody Screen: NEGATIVE
Unit division: 0

## 2018-01-20 MED ORDER — HALOPERIDOL LACTATE 5 MG/ML IJ SOLN: 0.5000 mg | INTRAMUSCULAR | Status: DC | PRN

## 2018-01-20 MED ORDER — LORAZEPAM 0.5 MG PO TABS: 0.5000 mg | ORAL_TABLET | ORAL | Status: DC | PRN

## 2018-01-20 MED ORDER — LORAZEPAM 2 MG/ML PO CONC
0.5000 mg | ORAL | Status: DC | PRN
  Filled 2018-01-20: qty 0.25

## 2018-01-20 MED ORDER — FENTANYL CITRATE (PF) 100 MCG/2ML IJ SOLN
25.0000 ug | INTRAMUSCULAR | Status: DC | PRN
  Administered 2018-01-20: 25 ug via INTRAVENOUS
  Administered 2018-01-20: 50 ug via INTRAVENOUS
  Administered 2018-01-21: 25 ug via INTRAVENOUS
  Filled 2018-01-20 (×3): qty 2

## 2018-01-20 MED ORDER — SODIUM CHLORIDE 0.9 % IV SOLN
1.0000 g | INTRAVENOUS | Status: DC
  Filled 2018-01-20 (×2): qty 1

## 2018-01-20 MED ORDER — FUROSEMIDE 10 MG/ML IJ SOLN
160.0000 mg | Freq: Four times a day (QID) | INTRAMUSCULAR | Status: DC
  Administered 2018-01-20 – 2018-01-21 (×4): 160 mg via INTRAVENOUS
  Filled 2018-01-20 (×2): qty 10
  Filled 2018-01-20: qty 16
  Filled 2018-01-20 (×2): qty 10
  Filled 2018-01-20: qty 16

## 2018-01-20 MED ORDER — HALOPERIDOL LACTATE 2 MG/ML PO CONC
0.5000 mg | ORAL | Status: DC | PRN
  Filled 2018-01-20: qty 0.3

## 2018-01-20 MED ORDER — MORPHINE SULFATE (PF) 2 MG/ML IV SOLN
1.0000 mg | INTRAVENOUS | Status: DC | PRN
Start: 2018-01-20 — End: 2018-01-20

## 2018-01-20 MED ORDER — FUROSEMIDE 10 MG/ML IJ SOLN: 100.0000 mg | Freq: Two times a day (BID) | INTRAVENOUS | Status: DC

## 2018-01-20 MED ORDER — HALOPERIDOL 0.5 MG PO TABS
0.5000 mg | ORAL_TABLET | ORAL | Status: DC | PRN
  Filled 2018-01-20: qty 1

## 2018-01-20 MED ORDER — HYPROMELLOSE (GONIOSCOPIC) 2.5 % OP SOLN
1.0000 [drp] | Freq: Four times a day (QID) | OPHTHALMIC | Status: DC | PRN
  Filled 2018-01-20: qty 15

## 2018-01-20 MED ORDER — GLYCOPYRROLATE 1 MG PO TABS
1.0000 mg | ORAL_TABLET | ORAL | Status: DC | PRN
  Filled 2018-01-20: qty 1

## 2018-01-20 MED ORDER — LORAZEPAM 2 MG/ML IJ SOLN
0.2500 mg | Freq: Two times a day (BID) | INTRAMUSCULAR | Status: DC
  Administered 2018-01-20 – 2018-01-21 (×3): 0.25 mg via INTRAVENOUS
  Filled 2018-01-20 (×3): qty 1

## 2018-01-20 MED ORDER — LORAZEPAM 2 MG/ML IJ SOLN: 0.5000 mg | INTRAMUSCULAR | Status: DC | PRN

## 2018-01-20 MED ORDER — GLYCOPYRROLATE 0.2 MG/ML IJ SOLN: 0.2000 mg | INTRAMUSCULAR | Status: DC | PRN

## 2018-01-20 MED ORDER — ALBUTEROL SULFATE (2.5 MG/3ML) 0.083% IN NEBU
2.5000 mg | INHALATION_SOLUTION | RESPIRATORY_TRACT | Status: AC
  Administered 2018-01-20: 2.5 mg via RESPIRATORY_TRACT
  Filled 2018-01-20: qty 3

## 2018-01-20 MED ORDER — GLYCOPYRROLATE 0.2 MG/ML IJ SOLN
0.3000 mg | Freq: Three times a day (TID) | INTRAMUSCULAR | Status: DC
  Administered 2018-01-20 – 2018-01-21 (×4): 0.3 mg via INTRAVENOUS
  Filled 2018-01-20 (×4): qty 2

## 2018-01-20 MED ORDER — FUROSEMIDE 10 MG/ML IJ SOLN: 80.0000 mg | Freq: Once | INTRAMUSCULAR | Status: DC

## 2018-01-20 MED ORDER — DILTIAZEM HCL 60 MG PO TABS
30.0000 mg | ORAL_TABLET | Freq: Four times a day (QID) | ORAL | Status: DC
  Administered 2018-01-20 – 2018-01-21 (×2): 30 mg via ORAL
  Filled 2018-01-20 (×2): qty 1

## 2018-01-20 MED ORDER — BIOTENE DRY MOUTH MT LIQD: 15.0000 mL | OROMUCOSAL | Status: DC | PRN

## 2018-01-20 MED ORDER — ALBUTEROL SULFATE (2.5 MG/3ML) 0.083% IN NEBU
2.5000 mg | INHALATION_SOLUTION | RESPIRATORY_TRACT | Status: DC | PRN
Start: 2018-01-20 — End: 2018-01-21

## 2018-01-20 NOTE — Progress Notes (Addendum)
PROGRESS NOTE    Brittney Valentine  JEH:[id number] DOB: 12-05-1940 DOA: 01/17/2018 PCP: Unk Pinto, MD   Brief Narrative:  this is a 77 year old woman with medical problems including cirrhosis, heart failure with preserved ejection fraction, age of fibrillation, poor embolus in June 2019, hypothyroidism who was admitted in July 2019 treated for multiple conditions including a UTI, bacteremia, C. Difficile colitis and is discharged on anticoagulation in addition to a diuretic regimen. For this July admission she was living independently, she is discharged to a skilled nursing facility to optimize functional status.  Patient reports she had an episode of emesis, staff there thought it was consistent with coffee grounds concern for GI bleed. The patient reports over the past one to days having burning in her chest, indigestion. She also reports receiving diuretics and regular basis. She reports having lower blood pressures with systolic blood pressure ranging from 75 and 90 millimeters mercury, she reports lightheadedness upon standing. She denies any diarrhea, fevers, chills, shortness of breath, chest pain.  There is report that at the skilled nursing facility, subcutaneous hydration was attempted.  She reports walking the day prior to admission with a forward walker, currently is needing assistance with her ADLs.   Assessment & Plan:   Active Problems:   AKI (acute kidney injury) [facility])   Gastrointestinal hemorrhage  # Goals of care: Pt declining labs this AM.  On our discussion, she notes she's interested in measures for comfort and hospice.  Will consult palliative care.   # Acute hypoxic respiratory failure:  CXR with acute pulmonary edema.  Diuresing with lasix, 160 q 6 hrs.  # Chest Pain:  Seems to be improved at this time, suspect it was secondary to increased WOB above.  EKG reviewed, notable for afib, no ST-T wave changes concerning for ischemia (compared to priors).  With her  desire to minimize lab draws and interventions will not work up further at this time.  #Acute kidney injury with hyperkalemia:  Pt with cirrhosis.  Clinically with anasarca, but BP's have been soft and reported decreased intake as well as lightheadedness.  She was previously on lasix/spironolactone prior to presentation (lasix dose appears to have been increased and spiro appears to have been decreased at d/c from last hospitalization).  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 - Creatinine peaked at 4.3, 4.20 yesterday, but declining labs today (baseline ~1.2) - midodrine, octreotide, albumin (s/p 2 doses) - will diurese today with 160 lasix q6 (discussed with renal) - renal consult - hyperk mild, improved - I/O, daily weights - Foley catheter has been placed  Coffee Ground Emesis  Acute Blood Loss Anemia:  H/H 6.6 8/13, down from 11.2 on 8/12 Transfused 1 unit pRBC IV PPI BID, octreotide EGD with grade II varices one with ulcer, s/p banding Transfuse for <7, but currently not desiring additional labs  Hx of PE/DVT: in 11/2017.  In setting of recent VTE in 11/2017 with ABLA above.  Appreciate vascular c/s, but pt desiring measures for comfort at this time.  Atrial Fibrillation with RVR: persistent this AM, transitioned to PO dilt.  SIRS  Hypotension  Gram negative UTI: WBC improved.  HR improved.  BP improved with midodrine.  No obvious signs of infection.  UA with >RBC, 11-20 WBC, few bacteria, mod LE.  Follow blood and urine cx.  CXR with cardiomegaly with small pleural effusions and prominent bronchovascular markings. - coag negative staph in aerobic bottle only (likely contaminant) - urine cx with gram  negative rods (enterobacter cloacae - susceptibilities pending), will treat with ceftriaxone -> broaden to cefepime  Decreased appetite: follow, no obvious cause on CT.  Nutrition c/s.   Lactic acidosis: improved  Heart failure with preserved EF: diuresing with  lasix as noted above  Hypothyroidism: she notes compliance, but increased TSH.  Will increase dose to 225 mcg.  Follow outpatient.   Cirrhosis: hx of nash cirrhosis.  MELD 34. With elevated INR.  Will give dose of vitamin K.   Thrombocytosis: follow, possibly reactive  DVT prophylaxis: SCD Code Status: DNR Family Communication: nephew updated over phone Disposition Plan: pending palliative care c/s, likely hospice   Consultants:   GI, cardiology  Procedures:   none  Antimicrobials:   none    Subjective: SOB last night with CP Described as pressure. Improved now after O2 applied.  Objective: Vitals:   01/19/18 1700 01/19/18 2030 01/20/18 0000 01/20/18 0329  BP: 98/63 (!) 97/57 (!) 98/49 101/63  Pulse: 70 (!) 40 (!) 25 67  Resp: 17 18 17 20   Temp:  (!) 97.5 F (36.4 C)  (!) 97.4 F (36.3 C)  TempSrc:  Oral  Oral  SpO2: 96% 96% 94% 100%  Weight:    109.8 kg  Height:        Intake/Output Summary (Last 24 hours) at 01/20/2018 0807 Last data filed at 01/20/2018 0705 Gross per 24 hour  Intake 1506.65 ml  Output 575 ml  Net 931.65 ml   Filed Weights   01/19/18 0353 01/19/18 1408 01/20/18 0329  Weight: 110 kg 110 kg 109.8 kg    Examination:  General: No acute distress. Cardiovascular: Heart sounds show a irregular rate and rhythm Lungs: Diffuse crackles, increased wob, transmitted upper airway sounds Abdomen: Soft, nontender, mildly distended Neurological: Alert and oriented 3. Moves all extremities 4. Cranial nerves II through XII grossly intact. Skin: Warm and dry. No rashes or lesions. Extremities: anasarca Psychiatric: Mood and affect are normal. Insight and judgment are appropriate.  Data Reviewed: I have personally reviewed following labs and imaging studies  CBC: Recent Labs  Lab 01/17/18 1436  01/18/18 0542 01/18/18 2008 01/19/18 0705 01/19/18 1437 01/19/18 1637 01/19/18 2200  WBC 11.2*  --  12.2*  --  6.1  --   --   --   HGB 11.0*   <  > 9.9* 11.2* 6.6* 9.2* 8.3* 7.9*  HCT 35.4*   < > 30.4* 33.5* 20.4* 27.0* 25.4* 24.2*  MCV 95.4  --  93.5  --  94.4  --   --   --   PLT 619*  --  619*  --  352  --   --   --    < > = values in this interval not displayed.   Basic Metabolic Panel: Recent Labs  Lab 01/17/18 1436  01/17/18 2211  01/18/18 0542 01/18/18 0921 01/18/18 1220 01/18/18 1646 01/19/18 0705 01/19/18 1437  NA 134*   < > 135  --  137  --   --  133* 135 134*  K 5.3*   < > 5.6*   < > 5.2* 5.5* 4.7 5.2* 4.5 4.7  CL 103   < > 105  --  104  --   --  102 102 99  CO2 22  --  17*  --  20*  --   --  19* 21*  --   GLUCOSE 91   < > 80  --  81  --   --  86 89  107*  BUN 78*   < > 80*  --  83*  --   --  84* 85* 82*  CREATININE 4.00*   < > 3.80*  --  3.87*  --   --  3.79* 4.05* 4.20*  CALCIUM 9.9  --  9.7  --  9.7  --   --  9.1 9.4  --   MG  --   --   --   --   --   --   --   --  2.6*  --    < > = values in this interval not displayed.   GFR: Estimated Creatinine Clearance: 14.5 mL/min (A) (by C-G formula based on SCr of 4.2 mg/dL (H)). Liver Function Tests: Recent Labs  Lab 01/17/18 1436 01/18/18 0542 01/19/18 0705  AST 68* 58* 44*  ALT 38 33 25  ALKPHOS 85 75 44  BILITOT 2.0* 2.3* 2.6*  PROT 5.9* 5.2* 5.1*  ALBUMIN 2.1* 1.9* 3.1*   Recent Labs  Lab 01/17/18 1436  LIPASE 80*   No results for input(s): AMMONIA in the last 168 hours. Coagulation Profile: Recent Labs  Lab 01/19/18 0437 01/19/18 0705  INR 2.68 2.54   Cardiac Enzymes: No results for input(s): CKTOTAL, CKMB, CKMBINDEX, TROPONINI in the last 168 hours. BNP (last 3 results) No results for input(s): PROBNP in the last 8760 hours. HbA1C: No results for input(s): HGBA1C in the last 72 hours. CBG: No results for input(s): GLUCAP in the last 168 hours. Lipid Profile: No results for input(s): CHOL, HDL, LDLCALC, TRIG, CHOLHDL, LDLDIRECT in the last 72 hours. Thyroid Function Tests: Recent Labs    01/18/18 0921 01/18/18 1220  TSH 42.291*   --   FREET4  --  0.85   Anemia Panel: No results for input(s): VITAMINB12, FOLATE, FERRITIN, TIBC, IRON, RETICCTPCT in the last 72 hours. Sepsis Labs: Recent Labs  Lab 01/18/18 0541 01/18/18 0921 01/18/18 1646 01/19/18 0437  LATICACIDVEN 2.8* 2.9* 2.4* 1.6    Recent Results (from the past 240 hour(s))  Culture, blood (routine x 2)     Status: None (Preliminary result)   Collection Time: 01/17/18 10:15 PM  Result Value Ref Range Status   Specimen Description BLOOD LEFT WRIST  Final   Special Requests   Final    BOTTLES DRAWN AEROBIC AND ANAEROBIC Blood Culture results may not be optimal due to an inadequate volume of blood received in culture bottles   Culture   Final    NO GROWTH 2 DAYS Performed at [facility] [address] [place], [place] [address]    Report Status PENDING  Incomplete  MRSA PCR Screening     Status: None   Collection Time: 01/17/18 11:16 PM  Result Value Ref Range Status   MRSA by PCR NEGATIVE NEGATIVE Final    Comment:        The GeneXpert MRSA Assay (FDA approved for NASAL specimens only), is one component of a comprehensive MRSA colonization surveillance program. It is not intended to diagnose MRSA infection nor to guide or monitor treatment for MRSA infections. Performed at [facility], [place] [address] [place], [place] [address]   Culture, blood (routine x 2)     Status: Abnormal   Collection Time: 01/17/18 11:18 PM  Result Value Ref Range Status   Specimen Description BLOOD RIGHT HAND  Final   Special Requests   Final    BOTTLES DRAWN AEROBIC ONLY Blood Culture adequate volume   Culture  Setup Time   Final    AEROBIC BOTTLE ONLY GRAM POSITIVE COCCI CRITICAL RESULT CALLED TO, READ BACK BY AND VERIFIED WITH: B MANCHERIL PHARMD 01/18/18 1858 JDW    Culture (A)  Final    STAPHYLOCOCCUS SPECIES (COAGULASE NEGATIVE) THE SIGNIFICANCE OF ISOLATING THIS ORGANISM FROM A SINGLE SET OF BLOOD CULTURES WHEN MULTIPLE SETS ARE DRAWN IS  UNCERTAIN. PLEASE NOTIFY THE MICROBIOLOGY DEPARTMENT WITHIN ONE WEEK IF SPECIATION AND SENSITIVITIES ARE REQUIRED. Performed at [place] [facility], [place] [address], [place], [place] [address]    Report Status 01/19/2018 FINAL  Final  Blood Culture ID Panel (Reflexed)     Status: Abnormal   Collection Time: 01/17/18 11:18 PM  Result Value Ref Range Status   Enterococcus species NOT DETECTED NOT DETECTED Final   Listeria monocytogenes NOT DETECTED NOT DETECTED Final   Staphylococcus species DETECTED (A) NOT DETECTED Final    Comment: Methicillin (oxacillin) resistant coagulase negative staphylococcus. Possible blood culture contaminant (unless isolated from more than one blood culture draw or clinical case suggests pathogenicity). No antibiotic treatment is indicated for blood  culture contaminants. CRITICAL RESULT CALLED TO, READ BACK BY AND VERIFIED WITH: B MACHERIL PHARMD 01/18/18 1903 JDW    Staphylococcus aureus NOT DETECTED NOT DETECTED Final   Methicillin resistance DETECTED (A) NOT DETECTED Final    Comment: CRITICAL RESULT CALLED TO, READ BACK BY AND VERIFIED WITH: B MACHERIL PHARMD 01/18/18 1903 JDW    Streptococcus species NOT DETECTED NOT DETECTED Final   Streptococcus agalactiae NOT DETECTED NOT DETECTED Final   Streptococcus pneumoniae NOT DETECTED NOT DETECTED Final   Streptococcus pyogenes NOT DETECTED NOT DETECTED Final   Acinetobacter baumannii NOT DETECTED NOT DETECTED Final   Enterobacteriaceae species NOT DETECTED NOT DETECTED Final   Enterobacter cloacae complex NOT DETECTED NOT DETECTED Final   Escherichia coli NOT DETECTED NOT DETECTED Final   Klebsiella oxytoca NOT DETECTED NOT DETECTED Final   Klebsiella pneumoniae NOT DETECTED NOT DETECTED Final   Proteus species NOT DETECTED NOT DETECTED Final   Serratia marcescens NOT DETECTED NOT DETECTED Final   Haemophilus influenzae NOT DETECTED NOT DETECTED Final   Neisseria meningitidis NOT DETECTED NOT DETECTED Final    Pseudomonas aeruginosa NOT DETECTED NOT DETECTED Final   Candida albicans NOT DETECTED NOT DETECTED Final   Candida glabrata NOT DETECTED NOT DETECTED Final   Candida krusei NOT DETECTED NOT DETECTED Final   Candida parapsilosis NOT DETECTED NOT DETECTED Final   Candida tropicalis NOT DETECTED NOT DETECTED Final    Comment: Performed at [facility], [place] [address], [place], [place] [address]  Culture, Urine     Status: Abnormal (Preliminary result)   Collection Time: 01/18/18  9:15 AM  Result Value Ref Range Status   Specimen Description URINE, CATHETERIZED  Final   Special Requests NONE  Final   Culture (A)  Final    30,000 COLONIES/mL ENTEROBACTER CLOACAE SUSCEPTIBILITIES TO FOLLOW CULTURE REINCUBATED FOR BETTER GROWTH Performed at [facility], [place] [address], [place],  [address]    Report Status PENDING  Incomplete         Radiology Studies: Dg Chest Port 1 View  Result Date: 01/20/2018 CLINICAL DATA:  77 year old female with chest pain, wheezing and shortness of breath. Postoperative day 1 upper endoscopy for banding of bleeding esophageal varices. EXAM: PORTABLE CHEST 1 VIEW COMPARISON:  CT Abdomen and Pelvis and portable chest 01/17/2018, and earlier. FINDINGS: Portable AP semi upright view at 0711 hours. The Stable cardiomegaly  and mediastinal contours. Visualized tracheal air column is within normal limits. Mildly larger lung volumes. Increased bilateral perihilar and basilar predominant interstitial opacity with superimposed failing at the lung bases, greater on the right. No pneumothorax. Dense retrocardiac opacity. Paucity of gas in the upper abdomen. IMPRESSION: 1. Acute bilateral pulmonary interstitial opacity suspicious for Acute Pulmonary Edema. Infection felt less likely. 2. Evidence of continued pleural effusions greater on the right. Retrocardiac atelectasis. Electronically Signed   By: [name] M.D.   On: 01/20/2018 07:45        Scheduled  Meds: . diltiazem  30 mg Oral Q6H  . furosemide  80 mg Intravenous Once  . levothyroxine  225 mcg Oral QAC breakfast  . midodrine  10 mg Oral TID WC  . pantoprazole (PROTONIX) IV  40 mg Intravenous BID  . sodium chloride flush  3 mL Intravenous Q12H   Continuous Infusions: . cefTRIAXone (ROCEPHIN)  IV 2 g (01/19/18 1308)  . octreotide  (SANDOSTATIN)    IV infusion 50 mcg/hr (01/20/18 0400)     LOS: 3 days    Time spent: over 30 min MDM high with AHRF, aki, acute blood loss anemia, cirrhosis, goc   Fayrene Helper, MD [facility] Pager [phone]  If 7PM-7AM, please contact night-coverage [email or web address] Password [email or web address] 01/20/2018, 8:07 AM

## 2018-01-20 NOTE — Progress Notes (Signed)
Clinical Social Worker received verbal consult from Palliative Team stating patient and family requesting referral to [facility]. CSW contacted [facility] to make referral. Per [facility]) facility does not have a bed available at this time but will follow up with patient. CSW will continue to follow patient for placement and support.   Rhea Pink, MSW,  [place]

## 2018-01-20 NOTE — Patient Outreach (Signed)
[facility]) Care Management  01/20/2018  Brittney Valentine 11/13/1940 [id number]   CSW attempted to call pt today for update on her SNF rehab stay and her nephew, Coralyn Mark, reports he has her phone because she is in the hospital.  "they have called in hospice and she is getting morphine" CSW offered support.  CSW will plan to follow while pt remains in hospital for disposition determination.  Nephew does not think she will return to SNF; thinks her death will be imminent.   Eduard Clos, MSW, [place] Worker  [place] [phone]

## 2018-01-20 NOTE — Progress Notes (Signed)
Spoke to Dr. Florene Glen regarding patient's continuance on Cardizem as patient is on comfort care measures. Dr. Florene Glen stated he would like patient to remain on Cardizem. Patient does not want any telemetry and requested telemetry box and electrodes be removed. Spoke with patient about taking Cardizem and she said at this time she does not want it but may decide at some point that she would like to take the medication. Patient is resting in bed and is alert and oriented. Will continue to monitor patient.

## 2018-01-20 NOTE — Progress Notes (Addendum)
Progress Note  Patient Name: Brittney Valentine Date of Encounter: 01/20/2018  Primary Cardiologist: Minus Breeding, MD   Subjective   Shortness of breath that began overnight.  Has not responded to breathing treatments.   Inpatient Medications    Scheduled Meds: . diltiazem  30 mg Oral Q6H  . levothyroxine  225 mcg Oral QAC breakfast  . midodrine  10 mg Oral TID WC  . pantoprazole (PROTONIX) IV  40 mg Intravenous BID  . sodium chloride flush  3 mL Intravenous Q12H   Continuous Infusions: . cefTRIAXone (ROCEPHIN)  IV 2 g (01/19/18 1308)  . furosemide 66 mL/hr at 01/20/18 0903  . octreotide  (SANDOSTATIN)    IV infusion 50 mcg/hr (01/20/18 0903)   PRN Meds: albuterol, ipratropium-albuterol, ondansetron   Vital Signs    Vitals:   01/19/18 1700 01/19/18 2030 01/20/18 0000 01/20/18 0329  BP: 98/63 (!) 97/57 (!) 98/49 101/63  Pulse: 70 (!) 40 (!) 25 67  Resp: 17 18 17 20   Temp:  (!) 97.5 F (36.4 C)  (!) 97.4 F (36.3 C)  TempSrc:  Oral  Oral  SpO2: 96% 96% 94% 100%  Weight:    109.8 kg  Height:        Intake/Output Summary (Last 24 hours) at 01/20/2018 0918 Last data filed at 01/20/2018 0903 Gross per 24 hour  Intake 1633.95 ml  Output 575 ml  Net 1058.95 ml   Filed Weights   01/19/18 0353 01/19/18 1408 01/20/18 0329  Weight: 110 kg 110 kg 109.8 kg    Telemetry    Atrial fibrillation.  Rate 60s-70s. - Personally Reviewed  ECG    01/20/18: Atrial fibrillation.  Rate 84 bpm.  Low voltage.  - Personally Reviewed  Physical Exam   VS:  BP 101/63 (BP Location: Left Arm)   Pulse 67   Temp (!) 97.4 F (36.3 C) (Oral)   Resp 20   Ht 5\' 5"  (1.651 m)   Wt 109.8 kg   SpO2 100%   BMI 40.28 kg/m  , BMI Body mass index is 40.28 kg/m. GENERAL:  Critically ill-appearing.  Accessory muscle use.  HEENT: Pupils equal round and reactive, fundi not visualized, oral mucosa unremarkable NECK:  JVD difficult to assess.  Carotid upstroke brisk and symmetric, no  bruits LUNGS: Diffuse expiratory wheezing. HEART:  Irregularly irregular.  PMI not displaced or sustained,S1 and S2 within normal limits, no S3, no S4, no clicks, no rubs, no murmurs ABD:  Flat, positive bowel sounds normal in frequency in pitch, no bruits, no rebound, no guarding, no midline pulsatile mass, no hepatomegaly, no splenomegaly EXT:  2 plus pulses throughout, 2+ LE edema, no cyanosis no clubbing SKIN:  No rashes no nodules NEURO:  Cranial nerves II through XII grossly intact, motor grossly intact throughout [facility]:  Cognitively intact, oriented to person place and time   Labs    Chemistry Recent Labs  Lab 01/17/18 1436  01/18/18 0542  01/18/18 1646 01/19/18 0705 01/19/18 1437  NA 134*   < > 137  --  133* 135 134*  K 5.3*   < > 5.2*   < > 5.2* 4.5 4.7  CL 103   < > 104  --  102 102 99  CO2 22   < > 20*  --  19* 21*  --   GLUCOSE 91   < > 81  --  86 89 107*  BUN 78*   < > 83*  --  84* 85*  82*  CREATININE 4.00*   < > 3.87*  --  3.79* 4.05* 4.20*  CALCIUM 9.9   < > 9.7  --  9.1 9.4  --   PROT 5.9*  --  5.2*  --   --  5.1*  --   ALBUMIN 2.1*  --  1.9*  --   --  3.1*  --   AST 68*  --  58*  --   --  44*  --   ALT 38  --  33  --   --  25  --   ALKPHOS 85  --  75  --   --  44  --   BILITOT 2.0*  --  2.3*  --   --  2.6*  --   GFRNONAA 10*   < > 11*  --  11* 10*  --   GFRAA 12*   < > 12*  --  13* 12*  --   ANIONGAP 9   < > 13  --  12 12  --    < > = values in this interval not displayed.     Hematology Recent Labs  Lab 01/17/18 1436  01/18/18 0542  01/19/18 0705 01/19/18 1437 01/19/18 1637 01/19/18 2200  WBC 11.2*  --  12.2*  --  6.1  --   --   --   RBC 3.71*  --  3.25*  --  2.16*  --   --   --   HGB 11.0*   < > 9.9*   < > 6.6* 9.2* 8.3* 7.9*  HCT 35.4*   < > 30.4*   < > 20.4* 27.0* 25.4* 24.2*  MCV 95.4  --  93.5  --  94.4  --   --   --   MCH 29.6  --  30.5  --  30.6  --   --   --   MCHC 31.1  --  32.6  --  32.4  --   --   --   RDW 19.5*  --  19.2*  --   18.9*  --   --   --   PLT 619*  --  619*  --  352  --   --   --    < > = values in this interval not displayed.    Cardiac EnzymesNo results for input(s): TROPONINI in the last 168 hours. No results for input(s): TROPIPOC in the last 168 hours.   BNPNo results for input(s): BNP, PROBNP in the last 168 hours.   DDimer No results for input(s): DDIMER in the last 168 hours.   Radiology    Dg Chest Port 1 View  Result Date: 01/20/2018 CLINICAL DATA:  77 year old female with chest pain, wheezing and shortness of breath. Postoperative day 1 upper endoscopy for banding of bleeding esophageal varices. EXAM: PORTABLE CHEST 1 VIEW COMPARISON:  CT Abdomen and Pelvis and portable chest 01/17/2018, and earlier. FINDINGS: Portable AP semi upright view at 0711 hours. The Stable cardiomegaly and mediastinal contours. Visualized tracheal air column is within normal limits. Mildly larger lung volumes. Increased bilateral perihilar and basilar predominant interstitial opacity with superimposed failing at the lung bases, greater on the right. No pneumothorax. Dense retrocardiac opacity. Paucity of gas in the upper abdomen. IMPRESSION: 1. Acute bilateral pulmonary interstitial opacity suspicious for Acute Pulmonary Edema. Infection felt less likely. 2. Evidence of continued pleural effusions greater on the right. Retrocardiac atelectasis. Electronically Signed   By: Herminio Heads.D.  On: 01/20/2018 07:45    Cardiac Studies   Echo 12/06/17: Study Conclusions  - Left ventricle: The cavity size was normal. Wall thickness was   increased in a pattern of mild LVH. Systolic function was normal.   The estimated ejection fraction was in the range of 55% to 60%.   Wall motion was normal; there were no regional wall motion   abnormalities. - Mitral valve: Calcified annulus. There was mild regurgitation. - Left atrium: The atrium was moderately dilated. - Right atrium: The atrium was moderately dilated. - Pulmonary  arteries: Systolic pressure was mildly increased. PA   peak pressure: 34 mm Hg (S).  Impressions:  - Normal LV function; mild LVH; mild MR; moderate biatrial   enlargement; mild TR; mild pulmonary hypertension.  Patient Profile     77 y.o. female with persistent atrial fibrillation, cirrhosis secondary to NASH, hypertension, esophageal varices, prior DVT and PE here with GI bleed, atrial fibrillation with rapid ventricular response, and acute renal failure.  Assessment & Plan    # Persistent atrial fibrillation: Rates are well-controlled on oral diltiazem.  She is not on anticoagulation due to her her upper GI bleed.  She developed hypothyroidism on amiodarone.  No other antiarrhythmic options given her AKI.  # AKI:  # Pulmonary edema:   Overnight Brittney Valentine developed increased work of breathing.  She had 500 mL's of urine output yesterday.  This is due to pulmonary edema in the setting of acute renal failure.  She is currently receiving a dose of IV Lasix.  We discussed the fact that if this does not work acutely to [place] her that her only option would be hemodialysis or comfort care.  She is not interested in hemodialysis and wants to pursue a palliative approach.  She has not responded to breathing treatments.  I will start morphine 1 mg every 2 hours as needed for shortness of breath.  Palliative care has been contacted.  #GI bleed: Brittney Valentine has known esophageal varices and presented with coffee-ground emesis.  She is refusing lab draws and is pursuing comfort approach as above.  #DVT/PE: Eliquis on hold.  Palliative care as above.  She was evaluated by vascular surgery for possible IVC filter, but this will not be pursued.  Brittney Valentine's brother and sister in law were updated at the bedside.  [facility] will sign off.   Medication Recommendations:  Continue diltiazem for rate control Other recommendations (labs, testing, etc):  non Follow up as an outpatient:   n/a  For questions or updates, please contact [facility] Please consult [email or web address] for contact info under Cardiology/STEMI.      Signed, Skeet Latch, MD  01/20/2018, 9:18 AM

## 2018-01-20 NOTE — Progress Notes (Addendum)
Patient ID: Brittney Valentine, female   DOB: August 17, 1940, 77 y.o.   MRN: [id number]    Progress Note   Subjective    SOB, wheezing- says she knows her body is breaking down, and she is choosing comfort care - can't take any more needles, no appetite- cant eat   Refused labs this am  No further Bm's or melena   Objective   Vital signs in last 24 hours: Temp:  [97.4 F (36.3 C)-97.7 F (36.5 C)] 97.4 F (36.3 C) (08/14 0329) Pulse Rate:  [25-86] 67 (08/14 0329) Resp:  [16-22] 20 (08/14 0329) BP: (95-118)/(38-66) 101/63 (08/14 0329) SpO2:  [94 %-100 %] 100 % (08/14 0329) Weight:  [109.8 kg-110 kg] 109.8 kg (08/14 0329) Last BM Date: 01/19/18 General:   elderly white female  With increased WOB Heart: irregular rate and rhythm; no murmurs Lungs: Respirations even labored, lungs  Congested  Abdomen:  Soft, nontender and nondistended. Normal bowel sounds, extensive abdominal wall edema Extremities:  2+ edema above knees Neurologic:  Alert and oriented,  grossly normal neurologically. Psych:  Cooperative. Normal mood and affect.  Intake/Output from previous day: 08/13 0701 - 08/14 0700 In: 1406.7 [I.V.:860.4; IV Piggyback:146.2] Out: 575 [Urine:575] Intake/Output this shift: Total I/O In: 227.3 [P.O.:100; I.V.:126.4; IV Piggyback:1] Out: -   Lab Results: Recent Labs    01/17/18 1436  01/18/18 0542  01/19/18 0705 01/19/18 1437 01/19/18 1637 01/19/18 2200  WBC 11.2*  --  12.2*  --  6.1  --   --   --   HGB 11.0*   < > 9.9*   < > 6.6* 9.2* 8.3* 7.9*  HCT 35.4*   < > 30.4*   < > 20.4* 27.0* 25.4* 24.2*  PLT 619*  --  619*  --  352  --   --   --    < > = values in this interval not displayed.   BMET Recent Labs    01/18/18 0542  01/18/18 1646 01/19/18 0705 01/19/18 1437  NA 137  --  133* 135 134*  K 5.2*   < > 5.2* 4.5 4.7  CL 104  --  102 102 99  CO2 20*  --  19* 21*  --   GLUCOSE 81  --  86 89 107*  BUN 83*  --  84* 85* 82*  CREATININE 3.87*  --  3.79* 4.05* 4.20*   CALCIUM 9.7  --  9.1 9.4  --    < > = values in this interval not displayed.   LFT Recent Labs    01/19/18 0705  PROT 5.1*  ALBUMIN 3.1*  AST 44*  ALT 25  ALKPHOS 44  BILITOT 2.6*   PT/INR Recent Labs    01/19/18 0437 01/19/18 0705  LABPROT 28.3* 27.1*  INR 2.68 2.54    Studies/Results: Dg Chest Port 1 View  Result Date: 01/20/2018 CLINICAL DATA:  77 year old female with chest pain, wheezing and shortness of breath. Postoperative day 1 upper endoscopy for banding of bleeding esophageal varices. EXAM: PORTABLE CHEST 1 VIEW COMPARISON:  CT Abdomen and Pelvis and portable chest 01/17/2018, and earlier. FINDINGS: Portable AP semi upright view at 0711 hours. The Stable cardiomegaly and mediastinal contours. Visualized tracheal air column is within normal limits. Mildly larger lung volumes. Increased bilateral perihilar and basilar predominant interstitial opacity with superimposed failing at the lung bases, greater on the right. No pneumothorax. Dense retrocardiac opacity. Paucity of gas in the upper abdomen. IMPRESSION: 1. Acute bilateral pulmonary interstitial  opacity suspicious for Acute Pulmonary Edema. Infection felt less likely. 2. Evidence of continued pleural effusions greater on the right. Retrocardiac atelectasis. Electronically Signed   By: [name] M.D.   On: 01/20/2018 07:45       Assessment / Plan:     #1 77 yo female with severely decompensated cirrhosis with GI bleeding secondary to esophageal varices  Stable s/p EGD with banding yesterday  No labs today  Generally continue Octreotide x 48 hrs Continue IV PPI  #2 ARF/HRS - as per renal - on support with Octreotide Albumin, midodrine, last creat worse last pm  #3CHF/acute pum edema- on high dose lasix  #4 Afib  #5 recent PE- anticoag on hold  Plan: Cardiology and renal have discussed options with pt- she wants comfort care at this point  Morphine started to help with SOB Palliative care to see  GI will  sign off- available if we can help this very  lovely lady    Active Problems:   AKI (acute kidney injury) ([place])   Gastrointestinal hemorrhage    LOS: 3 days   [name]  01/20/2018, 9:53 AM      Attending physician's note   I have taken an interval history, reviewed the chart and examined the patient. I agree with the Advanced Practitioner's note, impression and recommendations. Decompensated cirrhosis with ascites, bleeding esophageal varices, S/P banding yesterday. ARF/HRS with increasing Cr, renal service is following. Prognosis is poor given HRS. Pt wants comfort care, hospice. Continue IV PPI and IV octreotide for now. Generally stop octreotide after a course of 3-5 days. GI is available if needed.   Lucio Edward, MD FACG [phone] office

## 2018-01-20 NOTE — Consult Note (Signed)
Consultation Note Date: 01/20/2018   Patient Name: Brittney Valentine  DOB: Dec 08, 1940  MRN: [id number]  Age / Sex: 77 y.o., female  PCP: Unk Pinto, MD Referring Physician: Elodia Florence., *  Reason for Consultation: Establishing goals of care, Hospice Evaluation, Non pain symptom management and Psychosocial/spiritual support  HPI/Patient Profile: 77 y.o. female  with past medical history of cirrhosis, paroxysmal afib, HFpEF, recent PE and DVT who was admitted on 01/17/2018 with hypoxic respiratory failure. She was found to be hypotensive with severe volume overload and acute on chronic kidney failure. After 3 days of aggressive treatment without much improvement, the patient has elected comfort measures and requested a transfer to hospice house.    Clinical Assessment and Goals of Care:  I have reviewed medical records including EPIC notes, labs and imaging, received report from the care team, assessed the patient and then met at the bedside along with brother, Patrick Jupiter, and sister-in-law  to discuss diagnosis prognosis, GOC, EOL wishes, disposition and options.  I introduced Palliative Medicine as specialized medical care for people living with serious illness. It focuses on providing relief from the symptoms and stress of a serious illness. The goal is to improve quality of life for both the patient and the family.  We discussed a brief life review of the patient. She has always been a very independent person. Spent her career working for [employer]. She never had children. She has two siblings, and they have a close knit family. She is a strong [place] and enjoys reading her bible.   As far as functional and nutritional status, she no longer has a desire to eat. Her difficulty breathing prevents her from getting out of bed. At this point, her quality of life is very poor.   We discussed her  current illness and what it means in the larger context of her on-going co-morbidities.  Natural disease trajectory and expectations at EOL were discussed. Specifically, we discussed her combination of liver and kidney failure. I attempted to elicit values and goals of care important to the patient.  The difference between aggressive medical intervention and comfort care was considered in light of the patient's goals of care. She opts for full comfort care and is most concerned about quality of life. Hospice and Palliative Care services outpatient were explained and offered. Questions and concerns were addressed.  Hard Choices booklet left for review. The family was encouraged to call with questions or concerns. We discussed with her brother and sister-in-law regarding prognosis and answered any questions they had.       Primary Decision Maker:  PATIENT    SUMMARY OF RECOMMENDATIONS   Discharge to [facility] as soon as bed is available.   Code Status/Advance Care Planning:  DNR   Symptom Management:   Anxiety - scheduled and prn Ativan  Secretions - scheduled and prn Robinul  Shortness of breath - prn fentanyl, scheduled lasix, duonebs  Additional Recommendations (Limitations, Scope, Preferences):  Full Comfort Care  Palliative Prophylaxis:  Frequent Pain Assessment  Psycho-social/Spiritual:   Desire for further Chaplaincy support:  Prognosis:     Discharge Planning: Hospice facility      Primary Diagnoses: Present on Admission: . AKI (acute kidney injury) ([place])   I have reviewed the medical record, interviewed the patient and family, and examined the patient. The following aspects are pertinent.  Past Medical History:  Diagnosis Date  . Acute UTI   . Anemia   . Blood transfusion without reported diagnosis   . Cirrhosis ([place])   . Degenerative disk disease    knees  . Diverticulosis   . Dysrhythmia   . GERD (gastroesophageal reflux disease)   .  Hemorrhoids   . History of kidney stones   . Hypertension   . Hypothyroidism   . Obesity   . Paroxysmal atrial fibrillation ([facility])   . Partial small bowel obstruction ([place])   . Personal history of colonic polyps 03/25/2012   8 mm rectal adenoma 03/2012  . Portal hypertensive gastropathy ([place])   . Shingles   . Thyroid disease   . UTI (urinary tract infection) 12/13/2017  . Varices, esophageal ([place])   . Zoster    Social History   Socioeconomic History  . Marital status: Single    Spouse name: Not on file  . Number of children: Not on file  . Years of education: Not on file  . Highest education level: Not on file  Occupational History  . Not on file  Social Needs  . Financial resource strain: Not on file  . Food insecurity:    Worry: Not on file    Inability: Not on file  . Transportation needs:    Medical: Not on file    Non-medical: Not on file  Tobacco Use  . Smoking status: Never Smoker  . Smokeless tobacco: Never Used  Substance and Sexual Activity  . Alcohol use: No  . Drug use: No  . Sexual activity: Not Currently    Partners: Male  Lifestyle  . Physical activity:    Days per week: Not on file    Minutes per session: Not on file  . Stress: Not on file  Relationships  . Social connections:    Talks on phone: Not on file    Gets together: Not on file    Attends religious service: Not on file    Active member of club or organization: Not on file    Attends meetings of clubs or organizations: Not on file    Relationship status: Not on file  Other Topics Concern  . Not on file  Social History Narrative  . Not on file   Family History  Problem Relation Age of Onset  . Heart failure Mother        died from  . Hypertension Mother   . Heart attack Father        died from  . Hypertension Sister    Scheduled Meds: . diltiazem  30 mg Oral Q6H  . glycopyrrolate  0.3 mg Intravenous TID  . LORazepam  0.25 mg Intravenous BID  . pantoprazole (PROTONIX) IV   40 mg Intravenous BID  . sodium chloride flush  3 mL Intravenous Q12H   Continuous Infusions: . ceFEPime (MAXIPIME) IV    . furosemide 66 mL/hr at 01/20/18 0903   PRN Meds:.albuterol, antiseptic oral rinse, fentaNYL (SUBLIMAZE) injection, glycopyrrolate **OR** glycopyrrolate **OR** glycopyrrolate, haloperidol **OR** haloperidol **OR** haloperidol lactate, ipratropium-albuterol, LORazepam **OR** LORazepam **OR** LORazepam, ondansetron, polyvinyl alcohol Allergies  Allergen  Reactions  . Amoxicillin Palpitations    Has patient had a PCN reaction causing immediate rash, facial/tongue/throat swelling, SOB or lightheadedness with hypotension: No Has patient had a PCN reaction causing severe rash involving mucus membranes or skin necrosis: No Has patient had a PCN reaction that required hospitalization: No Has patient had a PCN reaction occurring within the last 10 years: No If all of the above answers are "NO", then may proceed with Cephalosporin use.   . Ciprofloxacin Swelling  . Sulfonamide Derivatives Other (See Comments)    See little dots, skin feels like its burning  . Robaxin [Methocarbamol] Palpitations  . Verapamil Palpitations   Review of Systems : shortness of breath, chest pain, anxiety  Physical Exam  General: well-developed, obese female who appears chronically ill and in mild respiratory distress CV: unable to hear due to breath sounds and body habitus Resp: diffuse crackles, wet breath sounds, increased work of breathing. Abd: obese, non-distended, non-tender Ext: 3+ pitting edema of lower extremities  Vital Signs: BP 101/63 (BP Location: Left Arm)   Pulse 67   Temp (!) 97.4 F (36.3 C) (Oral)   Resp 20   Ht 5' 5"  (1.651 m)   Wt 109.8 kg   SpO2 100%   BMI 40.28 kg/m  Pain Scale: 0-10    SpO2: SpO2: 100 % O2 Device:SpO2: 100 % O2 Flow Rate: .O2 Flow Rate (L/min): 5 L/min  IO: Intake/output summary:   Intake/Output Summary (Last 24 hours) at 01/20/2018  1008 Last data filed at 01/20/2018 9470 Gross per 24 hour  Intake 1383.95 ml  Output 575 ml  Net 808.95 ml    LBM: Last BM Date: 01/19/18 Baseline Weight: Weight: 95.7 kg Most recent weight: Weight: 109.8 kg     Palliative Assessment/Data: 20%     Time In: 9:30 Time Out: 10:18 Time Total: 50 minutes Greater than 50%  of this time was spent counseling and coordinating care related to the above assessment and plan.  Signed by: Florentina Jenny, PA-C Palliative Medicine Pager: [phone]  Please contact Palliative Medicine Team phone at [phone] for questions and concerns.  For individual provider: See Shea Evans

## 2018-01-20 NOTE — Progress Notes (Signed)
Pt was on Cardizem drip at a rate of 5 mL/hr. The HR was going from 65 to 57 bpm. The most recent BP was 98/49.  The on call hospitalist, Baltazar Najjar, was informed and asked if the drip should be stopped.  Baltazar Najjar ordered to discontinue the drip and started oral Cardizem 30 mg tablet every 6 hours starting at 0600. If HR is less than 70 and/or SBP less than 90, hold the Cardizem tablet.  The Cardizem drip has been stopped. Will continue to monitor pt.  Lupita Dawn, RN

## 2018-01-20 NOTE — Progress Notes (Signed)
   VASCULAR SURGERY ASSESSMENT & PLAN:   CHEST PAIN: Patient complained of shortness of breath earlier this morning and was put on oxygen.  She is telling me now that she is having chest pain.  I have notified the nurse and have ordered an EKG.  They will follow-up with the primary care service.  LEFT LOWER EXTREMITY DVT WITH POSSIBLE UPPER GI BLEED: The patient underwent endoscopy yesterday and a grade 2 varix was found in the distal esophagus.  3 bands were successfully placed.  There was no active bleeding noted during the procedure.  Of note she is on Eliquis not only for her DVT but also for atrial fibrillation.  If GI agrees I think it would be reasonable to continue her anticoagulation carefully if however, it is felt that her anticoagulation needs to be stopped she could potentially be considered for an IVC filter.  However she has significant renal insufficiency and we would have to try to do this without contrast which would be challenging given her obesity.  This could potentially be done with IVUS.  I had a long discussion with her yesterday about the advantages of an IVC filter and the potential complications.  She has multiple medical problems including cirrhosis, chronic kidney disease, congestive heart failure, obesity, and atrial fibrillation.  She is being considered for hospice.  Currently she is not especially interested in considering another invasive procedure and I think this is reasonable.  SUBJECTIVE:   Complains of chest pain.  Earlier she was complaining of shortness of breath.  PHYSICAL EXAM:   Vitals:   01/19/18 1700 01/19/18 2030 01/20/18 0000 01/20/18 0329  BP: 98/63 (!) 97/57 (!) 98/49 101/63  Pulse: 70 (!) 40 (!) 25 67  Resp: 17 18 17 20   Temp:  (!) 97.5 F (36.4 C)  (!) 97.4 F (36.3 C)  TempSrc:  Oral  Oral  SpO2: 96% 96% 94% 100%  Weight:    109.8 kg  Height:       No change in bilateral lower extremity swelling which is worse on the left side.  LABS:     This morning's labs are pending.  PROBLEM LIST:    Active Problems:   AKI (acute kidney injury) ([place])   Gastrointestinal hemorrhage   CURRENT MEDS:   . diltiazem  30 mg Oral Q6H  . levothyroxine  225 mcg Oral QAC breakfast  . midodrine  10 mg Oral TID WC  . pantoprazole (PROTONIX) IV  40 mg Intravenous BID  . sodium chloride flush  3 mL Intravenous Q12H   Deitra Mayo Beeper: [phone] Office: [phone] 01/20/2018

## 2018-01-20 NOTE — Progress Notes (Signed)
Visit was earlier in day-patient requested Bible reading and chaplain went to share passages the patient asked for and then added a few which I though she would like.  Patient loves old hymns and chaplain and family members sang several songs and watched the patient become more relaxed.  Had prayer with patient and family and then sang one more song before leaving.  The patient and family shared appreciation for the wonderful staff caring for her.  Conard Novak, Chaplain   01/20/18 1400  Clinical Encounter Type  Visited With Patient and family together  Visit Type Initial;Spiritual support;Other (Comment) (patient chose to be on comfort care now)  Referral From Nurse  Consult/Referral To [place] text;Prayer;Emotional  Stress Factors  Patient Stress Factors Health changes;Other (Comment) (following wishes of patient)

## 2018-01-20 NOTE — Progress Notes (Signed)
Subjective: Interval History: has complaints SOB.  Objective: Vital signs in last 24 hours: Temp:  [97.4 F (36.3 C)-97.7 F (36.5 C)] 97.4 F (36.3 C) (08/14 0329) Pulse Rate:  [25-86] 67 (08/14 0329) Resp:  [16-23] 20 (08/14 0329) BP: (95-118)/(38-66) 101/63 (08/14 0329) SpO2:  [94 %-100 %] 100 % (08/14 0329) Weight:  [109.8 kg-110 kg] 109.8 kg (08/14 0329) Weight change: 0 kg  Intake/Output from previous day: 08/13 0701 - 08/14 0700 In: 1406.7 [I.V.:860.4; IV Piggyback:146.2] Out: 575 [Urine:575] Intake/Output this shift: Total I/O In: 227.3 [P.O.:100; I.V.:126.4; IV Piggyback:1] Out: -   General appearance: cooperative, mild distress, moderately obese and pale Resp: rales bilaterally Cardio: irregularly irregular rhythm, S1, S2 normal and systolic murmur: systolic ejection 2/6, decrescendo at 2nd left intercostal space GI: obese, pos bs, large retraction from incision, mod distension Extremities: edema 3-4+  Lab Results: Recent Labs    01/18/18 0542  01/19/18 0705  01/19/18 1637 01/19/18 2200  WBC 12.2*  --  6.1  --   --   --   HGB 9.9*   < > 6.6*   < > 8.3* 7.9*  HCT 30.4*   < > 20.4*   < > 25.4* 24.2*  PLT 619*  --  352  --   --   --    < > = values in this interval not displayed.   BMET:  Recent Labs    01/18/18 1646 01/19/18 0705 01/19/18 1437  NA 133* 135 134*  K 5.2* 4.5 4.7  CL 102 102 99  CO2 19* 21*  --   GLUCOSE 86 89 107*  BUN 84* 85* 82*  CREATININE 3.79* 4.05* 4.20*  CALCIUM 9.1 9.4  --    No results for input(s): PTH in the last 72 hours. Iron Studies: No results for input(s): IRON, TIBC, TRANSFERRIN, FERRITIN in the last 72 hours.  Studies/Results: Dg Chest Port 1 View  Result Date: 01/20/2018 CLINICAL DATA:  77 year old female with chest pain, wheezing and shortness of breath. Postoperative day 1 upper endoscopy for banding of bleeding esophageal varices. EXAM: PORTABLE CHEST 1 VIEW COMPARISON:  CT Abdomen and Pelvis and portable  chest 01/17/2018, and earlier. FINDINGS: Portable AP semi upright view at 0711 hours. The Stable cardiomegaly and mediastinal contours. Visualized tracheal air column is within normal limits. Mildly larger lung volumes. Increased bilateral perihilar and basilar predominant interstitial opacity with superimposed failing at the lung bases, greater on the right. No pneumothorax. Dense retrocardiac opacity. Paucity of gas in the upper abdomen. IMPRESSION: 1. Acute bilateral pulmonary interstitial opacity suspicious for Acute Pulmonary Edema. Infection felt less likely. 2. Evidence of continued pleural effusions greater on the right. Retrocardiac atelectasis. Electronically Signed   By: Brittney Valentine M.D.   On: 01/20/2018 07:45    I have reviewed the patient's current medications.  Assessment/Plan: 1 AKI HRS,  Has has some response to diuretic so has some function. Agree with maximizing diuretics for comfort, discussed. 2 Cirrhosis 3 Obesity 4 Afib rate controlled 5 GIB P diuretics, mido, alb, octreotide, comfort measures    LOS: 3 days   Brittney Valentine Brittney Valentine 01/20/2018,9:04 AM

## 2018-01-20 NOTE — Progress Notes (Signed)
Pt complains of chest pain that feels more like "struggling to breathe." EKG was done.  Lungs sound wheezy and rhonchi.  Hospitalist has just been paged.  Will continue to monitor.  Lupita Dawn, RN

## 2018-01-21 DIAGNOSIS — R0602 Shortness of breath: Secondary | ICD-10-CM

## 2018-01-21 LAB — URINE CULTURE: Culture: 30000 — AB

## 2018-01-21 MED ORDER — FUROSEMIDE 10 MG/ML IJ SOLN
160.0000 mg | Freq: Three times a day (TID) | INTRAVENOUS | Status: DC
  Administered 2018-01-21: 160 mg via INTRAVENOUS
  Filled 2018-01-21 (×3): qty 16

## 2018-01-21 MED ORDER — FUROSEMIDE 80 MG PO TABS: 160.0000 mg | ORAL_TABLET | Freq: Three times a day (TID) | ORAL | Status: DC

## 2018-01-21 MED ORDER — DILTIAZEM HCL ER COATED BEADS 120 MG PO CP24: 120.0000 mg | ORAL_CAPSULE | Freq: Every day | ORAL | 0 refills | Status: AC

## 2018-01-21 MED ORDER — PANTOPRAZOLE SODIUM 40 MG PO TBEC
40.0000 mg | DELAYED_RELEASE_TABLET | Freq: Every day | ORAL | Status: DC
Start: 2018-01-22 — End: 2018-01-21

## 2018-01-21 MED ORDER — GLYCOPYRROLATE 0.2 MG/ML IJ SOLN: 0.3000 mg | Freq: Two times a day (BID) | INTRAMUSCULAR | Status: DC

## 2018-01-21 MED ORDER — FUROSEMIDE 40 MG PO TABS: 160.0000 mg | ORAL_TABLET | Freq: Three times a day (TID) | ORAL | 2 refills | Status: AC

## 2018-01-21 MED ORDER — DILTIAZEM HCL ER COATED BEADS 120 MG PO CP24
120.0000 mg | ORAL_CAPSULE | Freq: Every day | ORAL | Status: DC
  Administered 2018-01-21: 120 mg via ORAL
  Filled 2018-01-21: qty 1

## 2018-01-21 MED ORDER — PANTOPRAZOLE SODIUM 40 MG PO TBEC: 40.0000 mg | DELAYED_RELEASE_TABLET | Freq: Every day | ORAL | 0 refills | Status: AC

## 2018-01-21 NOTE — Progress Notes (Signed)
[facility] RN note.  Received request from [place], [place] for family interest in [facility] with request for transfer today. Chart reviewed. Met with  patient and family to confirm interest and explain services. Family agreeable to transfer today. CSW aware.  Registration paper work completed.. Dr. Orpah Melter to assume care per family request. Please fax discharge summary to [phone]. RN please call report to [phone]. Please arrange transport for patient to arrive before noon if possible.  Thank you,  Farrel Gordon, RN, [facility] Liaison  [place] are on AMION

## 2018-01-21 NOTE — Discharge Summary (Addendum)
Physician Discharge Summary  Brittney Valentine BWI:[id number] DOB: 12/25/1940 DOA: 01/17/2018  PCP: Unk Pinto, MD  Admit date: 01/17/2018 Discharge date: 01/21/2018  Time spent: 40 minutes  Recommendations for Outpatient Follow-up:  1. Comfort care measures per hospice facility 2. Would recommend continuing lasix 160 mg TID for comfort (as well as diltiazem and protonix).  Also leaving foley for comfort.  Discharge Diagnoses:  Active Problems:   AKI (acute kidney injury) [facility])   Gastrointestinal hemorrhage   Hyperkalemia   Epigastric abdominal pain   Palliative care encounter   SOB (shortness of breath)   Discharge Condition: stable  Filed Weights   01/19/18 0353 01/19/18 1408 01/20/18 0329  Weight: 110 kg 110 kg 109.8 kg    History of present illness:  this is a 77 year old woman with medical problems including cirrhosis, heart failure with preserved ejection fraction, age of fibrillation, poor embolus in June 2019, hypothyroidism who was admitted in July 2019 treated for multiple conditions including a UTI, bacteremia, C. Difficile colitis and is discharged on anticoagulation in addition to a diuretic regimen. For this July admission she was living independently, she is discharged to a skilled nursing facility to optimize functional status.  Patient reports she had an episode of emesis, staff there thought it was consistent with coffee grounds concern for GI bleed. The patient reports over the past one to days having burning in her chest, indigestion. She also reports receiving diuretics and regular basis. She reports having lower blood pressures with systolic blood pressure ranging from 75 and 90 millimeters mercury, she reports lightheadedness upon standing. She denies any diarrhea, fevers, chills, shortness of breath, chest pain.  There is report that at the skilled nursing facility, subcutaneous hydration was attempted.  She reports walking the day prior to admission  with a forward walker, currently is needing assistance with her ADLs.   She was admitted for acute kidney injury and coffee ground emesis.  GI was consulted.  The patient was started on IV PPI BID in addition to octreotide.  There was also concern for hepatorenal syndrome so renal was consulted.  They performed upper endoscopy with banding on 8/13.  Nephrology was consulted noted concern for HRS.  Pt developed worsening respiratory status on 8/14 and made decision for comfort care/hospice.  She was transitioned to PO diuretics on the day of discharge.     Hospital Course:  # Goals of care: Pt declined labs on 8/14.  On our discussion, she notes she's interested in measures for comfort and hospice.  Palliative care c/s and recommended inpatient hospice and comfort measures.   # Acute hypoxic respiratory failure:  CXR with acute pulmonary edema.  Diuresed with lasix 160 q 6 hrs with good output.    # Chest Pain:  Seems to be improved at this time, suspect it was secondary to increased WOB above.  EKG reviewed, notable for afib, no ST-T wave changes concerning for ischemia (compared to priors).  With her desire to minimize lab draws and interventions will not work up further at this time.   #Acute kidney injury with hyperkalemia:  Concern for hepatorenal syndrome in setting of cirrhosis.   - Creatinine peaked at 4.3, 4.20 8/13, but pt has been declining labs since (baseline ~1.2) - s/p midodrine, octreotide, albumin (s/p 2 doses) - Diuresed with lasix 160 mg q6.  Made good UOP yesterday.  Will discharge with lasix 160 mg PO TID for comfort.  Of note, renal did mention she was making urine and  could potentially stabilize and improve GFR.  Discussed with pt and family, but pt did not desire any additional labs and desired comfort measures and hospice. 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  In setting of recent VTE in 11/2017 with ABLA above.  Appreciate vascular c/s, but pt desiring measures for comfort at this time.  Atrial Fibrillation with RVR:HR appropriate on PO dilt  SIRS  Hypotension  Gram negative UTI: WBC improved.  HR improved.  BP improved with midodrine.  No obvious signs of infection.  UA with >RBC, 11-20 WBC, few bacteria, mod LE.  Follow blood and urine cx.  CXR with cardiomegaly with small pleural effusions and prominent bronchovascular markings. - coag negative staph in aerobic bottle only (likely contaminant) - urine cx with gram negative rods (enterobacter cloacae - susceptibilities pending) - abx d/c'd with comfort measures  Decreased appetite: follow, no obvious cause on CT.  Nutrition c/s.   Lactic acidosis:improved  Heart failure with preserved BT:DVVOH for comfort  Hypothyroidism:TSH increased.  Synthroid d/c'd for comfort measures.  Cirrhosis:hx of nash cirrhosis.  MELD 34. With elevated INR.  Will give dose of vitamin K.   Thrombocytosis: follow, possibly reactive  Procedures:  S/p EGD on 8/13 with varices s/p banding  Consultations:  Nephrology  Cardiology  GI  Palliative  Discharge Exam: Vitals:   01/21/18 0916 01/21/18 1130  BP: 120/63 (!) 118/93  Pulse: 85 95  Resp: 17   Temp: 98.7 F (37.1 C) 98.7 F (37.1 C)  SpO2: 96% 98%   No complaints.  Feeling a bit better.  General: No acute distress. Cardiovascular: Heart sounds show a regular rate, and rhythm.  Lungs: Diffuse wheezing and crackles Abdomen: Soft, distended, non ttp Neurological: Alert and oriented 3. Moves all extremities 4. Cranial nerves II through XII grossly intact. Skin: Warm and dry. No rashes or  lesions. Extremities: anasarca Psychiatric: Mood and affect are normal. Insight and judgment are appropriate.  Discharge Instructions   Discharge Instructions    Diet - low sodium heart healthy   Complete by:  As directed    Discharge instructions   Complete by:  As directed    We are planning to discharge you to [facility] for inpatient hospice.   Increase activity slowly   Complete by:  As directed      Allergies as of 01/21/2018      Reactions   Amoxicillin Palpitations   Has patient had a PCN reaction causing immediate rash, facial/tongue/throat swelling, SOB or lightheadedness with hypotension: No Has patient had a PCN reaction causing severe rash involving mucus membranes or skin necrosis: No Has patient had a PCN reaction that required hospitalization: No Has patient had a PCN reaction occurring within the last 10 years: No If all of the above answers are "NO", then may proceed with Cephalosporin use.   Ciprofloxacin Swelling   Sulfonamide Derivatives Other (See Comments)   See little dots, skin feels like its burning   Robaxin [methocarbamol] Palpitations   Verapamil Palpitations      Medication List    STOP taking these medications   apixaban 5 MG Tabs  tablet Commonly known as:  ELIQUIS   feeding supplement Liqd   folic acid 1 MG tablet Commonly known as:  FOLVITE   lactulose 10 GM/15ML solution Commonly known as:  CHRONULAC   levothyroxine 150 MCG tablet Commonly known as:  SYNTHROID, LEVOTHROID   levothyroxine 200 MCG tablet Commonly known as:  SYNTHROID, LEVOTHROID   nitrofurantoin 100 MG capsule Commonly known as:  MACRODANTIN   nystatin 100000 UNIT/ML suspension Commonly known as:  MYCOSTATIN   ondansetron 4 MG tablet Commonly known as:  ZOFRAN   saccharomyces boulardii 250 MG capsule Commonly known as:  FLORASTOR   spironolactone 25 MG tablet Commonly known as:  ALDACTONE   Vitamin D3 50000 units Caps     TAKE these medications    alum & mag hydroxide-simeth 400-400-40 MG/5ML suspension Commonly known as:  MAALOX PLUS Take 15 mLs by mouth every 6 (six) hours as needed for indigestion.   diltiazem 120 MG 24 hr capsule Commonly known as:  CARDIZEM CD Take 1 capsule (120 mg total) by mouth daily.   furosemide 40 MG tablet Commonly known as:  LASIX Take 4 tablets (160 mg total) by mouth 3 (three) times daily. What changed:    how much to take  when to take this   pantoprazole 40 MG tablet Commonly known as:  PROTONIX Take 1 tablet (40 mg total) by mouth daily. Start taking on:  01/22/2018   propranolol 20 MG tablet Commonly known as:  INDERAL Take 1 tablet (20 mg total) by mouth 2 (two) times daily.      Allergies  Allergen Reactions  . Amoxicillin Palpitations    Has patient had a PCN reaction causing immediate rash, facial/tongue/throat swelling, SOB or lightheadedness with hypotension: No Has patient had a PCN reaction causing severe rash involving mucus membranes or skin necrosis: No Has patient had a PCN reaction that required hospitalization: No Has patient had a PCN reaction occurring within the last 10 years: No If all of the above answers are "NO", then may proceed with Cephalosporin use.   . Ciprofloxacin Swelling  . Sulfonamide Derivatives Other (See Comments)    See little dots, skin feels like its burning  . Robaxin [Methocarbamol] Palpitations  . Verapamil Palpitations      The results of significant diagnostics from this hospitalization (including imaging, microbiology, ancillary and laboratory) are listed below for reference.    Significant Diagnostic Studies: Ct Abdomen Pelvis Wo Contrast  Result Date: 01/17/2018 CLINICAL DATA:  Coffee ground emesis with history of esophageal varices EXAM: CT ABDOMEN AND PELVIS WITHOUT CONTRAST TECHNIQUE: Multidetector CT imaging of the abdomen and pelvis was performed following the standard protocol without IV contrast. COMPARISON:  12/13/2017  FINDINGS: Lower chest: Bilateral pleural effusions are noted right considerably greater than left similar to that seen on the prior exam with underlying lower lobe atelectatic changes. Hepatobiliary: The liver is small and nodular in appearance consistent with underlying cirrhotic change. Stable hypodense lesions are noted in the posterior aspect of the right lobe of the liver as well as the lateral segment of the left lobe of the liver. The gallbladder has been surgically removed. Pancreas: Unremarkable. No pancreatic ductal dilatation or surrounding inflammatory changes. Spleen: Normal in size without focal abnormality. The previously seen splenomegaly is not well appreciated on today's exam. Adrenals/Urinary Tract: The adrenal glands are within normal limits. Tiny nonobstructing stone is now seen in the lower pole of the left kidney. No obstructive changes are noted. Persistent distal left ureteral stone is  noted stable from the previous exam. The bladder is decompressed. Stomach/Bowel: The appendix has been surgically removed. Mild retained fecal material is noted although no constipation is seen. Some wall thickening in the small bowel is noted likely related to the underlying ascites. Postsurgical changes in the right lower quadrant are noted. No definitive obstructive changes seen. Vascular/Lymphatic: Aortic atherosclerosis. No enlarged abdominal or pelvic lymph nodes. Reproductive: Status post hysterectomy. No adnexal masses. Other: Moderate ascites is noted slightly increased when compared with the prior exam. Changes of anasarca are noted. These have increased in the interval from the prior exam. Musculoskeletal: Degenerative changes of the lumbar spine are noted. IMPRESSION: Changes consistent with cirrhosis of the liver with stable hypodense lesions in both the right and left lobes as well as changes of ascites. The known esophageal varices are not well appreciated on this exam. Increasing changes of  anasarca when compared with the prior study. Bilateral pleural effusions with associated lower lobe atelectasis/infiltrate. This is stable from the prior exam. Distal left ureteral stone which has migrated slightly in the interval from the prior exam. A tiny lower pole left renal stone is noted. Diffuse small bowel wall thickening likely related to the underlying ascites Electronically Signed   By: Inez Catalina M.D.   On: 01/17/2018 20:05   Dg Chest Port 1 View  Result Date: 01/20/2018 CLINICAL DATA:  77 year old female with chest pain, wheezing and shortness of breath. Postoperative day 1 upper endoscopy for banding of bleeding esophageal varices. EXAM: PORTABLE CHEST 1 VIEW COMPARISON:  CT Abdomen and Pelvis and portable chest 01/17/2018, and earlier. FINDINGS: Portable AP semi upright view at 0711 hours. The Stable cardiomegaly and mediastinal contours. Visualized tracheal air column is within normal limits. Mildly larger lung volumes. Increased bilateral perihilar and basilar predominant interstitial opacity with superimposed failing at the lung bases, greater on the right. No pneumothorax. Dense retrocardiac opacity. Paucity of gas in the upper abdomen. IMPRESSION: 1. Acute bilateral pulmonary interstitial opacity suspicious for Acute Pulmonary Edema. Infection felt less likely. 2. Evidence of continued pleural effusions greater on the right. Retrocardiac atelectasis. Electronically Signed   By: [name] M.D.   On: 01/20/2018 07:45   Dg Chest Port 1 View  Result Date: 01/17/2018 CLINICAL DATA:  Per ED notes, pt brought in for coffee ground emesis since this am. Pt has hx of afib- is on eliquis, pt also has hx of esophageal varices. Pt endorses SOB with exertion. Per EMS pt's abdomen is rigid d/t subcutaneous fluid admin at facility. Pt is not eating, states even the sight of food makes her nauseous. Pt denies any pain. Hx of HTN. Non-smoker EXAM: PORTABLE CHEST 1 VIEW COMPARISON:  12/13/2017 FINDINGS:  Cardiac silhouette is mildly enlarged. No mediastinal or hilar masses. There are prominent bronchovascular markings. There is hazy opacity in the lung bases consistent with small pleural effusions. No convincing pneumonia. No pneumothorax. Skeletal structures are grossly intact. IMPRESSION: 1. Mild cardiomegaly with small pleural effusions and prominent bronchovascular markings. The findings are similar to the prior study. Mild congestive heart failure is suspected given the symptoms of shortness of breath. Electronically Signed   By: Lajean Manes M.D.   On: 01/17/2018 15:29    Microbiology: Recent Results (from the past 240 hour(s))  Culture, blood (routine x 2)     Status: None (Preliminary result)   Collection Time: 01/17/18 10:15 PM  Result Value Ref Range Status   Specimen Description BLOOD LEFT WRIST  Final   Special Requests  Final    BOTTLES DRAWN AEROBIC AND ANAEROBIC Blood Culture results may not be optimal due to an inadequate volume of blood received in culture bottles   Culture   Final    NO GROWTH 4 DAYS Performed at [facility], [place] [address], [place], [place] [address]    Report Status PENDING  Incomplete  MRSA PCR Screening     Status: None   Collection Time: 01/17/18 11:16 PM  Result Value Ref Range Status   MRSA by PCR NEGATIVE NEGATIVE Final    Comment:        The GeneXpert MRSA Assay (FDA approved for NASAL specimens only), is one component of a comprehensive MRSA colonization surveillance program. It is not intended to diagnose MRSA infection nor to guide or monitor treatment for MRSA infections. Performed at [facility], [place] [address], [place], [place] [address]   Culture, blood (routine x 2)     Status: Abnormal   Collection Time: 01/17/18 11:18 PM  Result Value Ref Range Status   Specimen Description BLOOD RIGHT HAND  Final   Special Requests   Final    BOTTLES DRAWN AEROBIC ONLY Blood Culture adequate volume   Culture  Setup Time    Final    AEROBIC BOTTLE ONLY GRAM POSITIVE COCCI CRITICAL RESULT CALLED TO, READ BACK BY AND VERIFIED WITH: B MANCHERIL PHARMD 01/18/18 1858 JDW    Culture (A)  Final    STAPHYLOCOCCUS SPECIES (COAGULASE NEGATIVE) THE SIGNIFICANCE OF ISOLATING THIS ORGANISM FROM A SINGLE SET OF BLOOD CULTURES WHEN MULTIPLE SETS ARE DRAWN IS UNCERTAIN. PLEASE NOTIFY THE MICROBIOLOGY DEPARTMENT WITHIN ONE WEEK IF SPECIATION AND SENSITIVITIES ARE REQUIRED. Performed at [facility], [place] [address] [place], [place] [address]    Report Status 01/19/2018 FINAL  Final  Blood Culture ID Panel (Reflexed)     Status: Abnormal   Collection Time: 01/17/18 11:18 PM  Result Value Ref Range Status   Enterococcus species NOT DETECTED NOT DETECTED Final   Listeria monocytogenes NOT DETECTED NOT DETECTED Final   Staphylococcus species DETECTED (A) NOT DETECTED Final    Comment: Methicillin (oxacillin) resistant coagulase negative staphylococcus. Possible blood culture contaminant (unless isolated from more than one blood culture draw or clinical case suggests pathogenicity). No antibiotic treatment is indicated for blood  culture contaminants. CRITICAL RESULT CALLED TO, READ BACK BY AND VERIFIED WITH: B MACHERIL PHARMD 01/18/18 1903 JDW    Staphylococcus aureus NOT DETECTED NOT DETECTED Final   Methicillin resistance DETECTED (A) NOT DETECTED Final    Comment: CRITICAL RESULT CALLED TO, READ BACK BY AND VERIFIED WITH: B MACHERIL PHARMD 01/18/18 1903 JDW    Streptococcus species NOT DETECTED NOT DETECTED Final   Streptococcus agalactiae NOT DETECTED NOT DETECTED Final   Streptococcus pneumoniae NOT DETECTED NOT DETECTED Final   Streptococcus pyogenes NOT DETECTED NOT DETECTED Final   Acinetobacter baumannii NOT DETECTED NOT DETECTED Final   Enterobacteriaceae species NOT DETECTED NOT DETECTED Final   Enterobacter cloacae complex NOT DETECTED NOT DETECTED Final   Escherichia coli NOT DETECTED NOT DETECTED Final    Klebsiella oxytoca NOT DETECTED NOT DETECTED Final   Klebsiella pneumoniae NOT DETECTED NOT DETECTED Final   Proteus species NOT DETECTED NOT DETECTED Final   Serratia marcescens NOT DETECTED NOT DETECTED Final   Haemophilus influenzae NOT DETECTED NOT DETECTED Final   Neisseria meningitidis NOT DETECTED NOT DETECTED Final   Pseudomonas aeruginosa NOT DETECTED NOT DETECTED Final   Candida albicans NOT DETECTED  NOT DETECTED Final   Candida glabrata NOT DETECTED NOT DETECTED Final   Candida krusei NOT DETECTED NOT DETECTED Final   Candida parapsilosis NOT DETECTED NOT DETECTED Final   Candida tropicalis NOT DETECTED NOT DETECTED Final    Comment: Performed at [facility], [place] [address] [place], [place] [address]  Culture, Urine     Status: Abnormal   Collection Time: 01/18/18  9:15 AM  Result Value Ref Range Status   Specimen Description URINE, CATHETERIZED  Final   Special Requests   Final    NONE Performed at [facility], [place] [address] [place], [place] [address]    Culture 30,000 COLONIES/mL ENTEROBACTER CLOACAE (A)  Final   Report Status 01/21/2018 FINAL  Final   Organism ID, Bacteria ENTEROBACTER CLOACAE (A)  Final      Susceptibility   Enterobacter cloacae - MIC*    CEFAZOLIN >=64 RESISTANT Resistant     CEFTRIAXONE >=64 RESISTANT Resistant     CIPROFLOXACIN <=0.25 SENSITIVE Sensitive     GENTAMICIN <=1 SENSITIVE Sensitive     IMIPENEM <=0.25 SENSITIVE Sensitive     NITROFURANTOIN 32 SENSITIVE Sensitive     TRIMETH/SULFA <=20 SENSITIVE Sensitive     PIP/TAZO >=128 RESISTANT Resistant     * 30,000 COLONIES/mL ENTEROBACTER CLOACAE     Labs: Basic Metabolic Panel: Recent Labs  Lab 01/17/18 1436  01/17/18 2211  01/18/18 0542 01/18/18 0921 01/18/18 1220 01/18/18 1646 01/19/18 0705 01/19/18 1437  NA 134*   < > 135  --  137  --   --  133* 135 134*  K 5.3*   < > 5.6*   < > 5.2* 5.5* 4.7 5.2* 4.5 4.7  CL 103   < > 105  --  104  --   --  102 102 99  CO2  22  --  17*  --  20*  --   --  19* 21*  --   GLUCOSE 91   < > 80  --  81  --   --  86 89 107*  BUN 78*   < > 80*  --  83*  --   --  84* 85* 82*  CREATININE 4.00*   < > 3.80*  --  3.87*  --   --  3.79* 4.05* 4.20*  CALCIUM 9.9  --  9.7  --  9.7  --   --  9.1 9.4  --   MG  --   --   --   --   --   --   --   --  2.6*  --    < > = values in this interval not displayed.   Liver Function Tests: Recent Labs  Lab 01/17/18 1436 01/18/18 0542 01/19/18 0705  AST 68* 58* 44*  ALT 38 33 25  ALKPHOS 85 75 44  BILITOT 2.0* 2.3* 2.6*  PROT 5.9* 5.2* 5.1*  ALBUMIN 2.1* 1.9* 3.1*   Recent Labs  Lab 01/17/18 1436  LIPASE 80*   No results for input(s): AMMONIA in the last 168 hours. CBC: Recent Labs  Lab 01/17/18 1436  01/18/18 0542 01/18/18 2008 01/19/18 0705 01/19/18 1437 01/19/18 1637 01/19/18 2200  WBC 11.2*  --  12.2*  --  6.1  --   --   --   HGB 11.0*   < > 9.9* 11.2* 6.6* 9.2* 8.3* 7.9*  HCT 35.4*   < > 30.4* 33.5* 20.4* 27.0* 25.4* 24.2*  MCV 95.4  --  93.5  --  94.4  --   --   --   PLT 619*  --  619*  --  352  --   --   --    < > = values in this interval not displayed.   Cardiac Enzymes: No results for input(s): CKTOTAL, CKMB, CKMBINDEX, TROPONINI in the last 168 hours. BNP: BNP (last 3 results) Recent Labs    04/14/17 1125 12/04/17 2142 12/14/17 0338  BNP 603.4* 289.5* 408.3*    ProBNP (last 3 results) No results for input(s): PROBNP in the last 8760 hours.  CBG: No results for input(s): GLUCAP in the last 168 hours.     Signed:  Fayrene Helper MD.  [facility] 01/21/2018, 12:20 PM

## 2018-01-21 NOTE — Care Management Important Message (Signed)
Important Message  Patient Details  Name: [name] MRN: [id number] Date of Birth: 02/20/1941   Medicare Important Message Given:  Yes    Pamela Intrieri P [place] 01/21/2018, 4:04 PM

## 2018-01-21 NOTE — Consult Note (Signed)
  01/21/18   [facility] CM Inpatient Consult   01/21/2018  SHADAWN HANAWAY 1941-02-23 [id number]   Chart reviewed and spoke with inpatient RNCM, Steffanie Dunn, who states patient will transition to [facility] today.  Will update [facility] of disposition.  [facility] Care management will sign off as her needs will be met at [facility].  Natividad Brood, RN BSN [facility] Liaison  [phone] business mobile phone Toll free office [phone]

## 2018-01-21 NOTE — Progress Notes (Signed)
Patient ID: Brittney Valentine, female   DOB: Mar 09, 1941, 77 y.o.   MRN: [id number] Making some urine, will convert to po diuretics.  May stabilize and improve GFR yet.  Decision to go to hospice.  Will S/O

## 2018-01-21 NOTE — Progress Notes (Signed)
Daily Progress Note   Patient Name: Brittney Valentine       Date: 01/21/2018 DOB: [birth year]  Age: 77 y.o. MRN#: [id number] Attending Physician: Elodia Florence., * Primary Care Physician: Unk Pinto, MD Admit Date: 01/17/2018  Reason for Consultation/Follow-up: Hospice Evaluation, Non pain symptom management and Psychosocial/spiritual support  Subjective: Patient is sleeping but awakens to touch, smiles and tells me she is comfortable.  States her mouth is dry.   Assessment: Patient requested to go to [facility] yesterday.  She understands she has both liver and kidney failure that will not improve.  She has been independent her entire life and just wants to pass on comfortably - maintaining her independence.  Patient requests comfort measures only.   Patient Profile/HPI:  77 y.o. female  with past medical history of cirrhosis, paroxysmal afib, HFpEF, recent PE and DVT who was admitted on 01/17/2018 with hypoxic respiratory failure. She was found to be hypotensive with severe volume overload and acute on chronic kidney failure. After 3 days of aggressive treatment without much improvement, the patient has elected comfort measures and requested a transfer to hospice house.     Length of Stay: 4  Current Medications: Scheduled Meds:  . diltiazem  30 mg Oral Q6H  . glycopyrrolate  0.3 mg Intravenous BID  . LORazepam  0.25 mg Intravenous BID  . [START ON 01/22/2018] pantoprazole  40 mg Oral Daily  . sodium chloride flush  3 mL Intravenous Q12H    Continuous Infusions: . furosemide 160 mg (01/21/18 0849)    PRN Meds: albuterol, antiseptic oral rinse, fentaNYL (SUBLIMAZE) injection, glycopyrrolate **OR** glycopyrrolate **OR** glycopyrrolate, haloperidol **OR** haloperidol  **OR** haloperidol lactate, hydroxypropyl methylcellulose / hypromellose, ipratropium-albuterol, LORazepam **OR** LORazepam **OR** LORazepam, ondansetron  Physical Exam       Elderly obese female, sleeping, wakes to touch. coherent CV rrr resp increased work of breathing with relatively shallow breaths and coarse upper respiratory breath sounds Abdomen soft, nt, nd Lower ext 3+ pitting.  Vital Signs: BP 133/63 (BP Location: Left Arm)   Pulse 85   Temp (!) 97.5 F (36.4 C) (Oral)   Resp 20   Ht 5\' 5"  (1.651 m)   Wt 109.8 kg   SpO2 91%   BMI 40.28 kg/m  SpO2: SpO2: 91 % O2 Device: O2 Device: Nasal  Cannula O2 Flow Rate: O2 Flow Rate (L/min): 3 L/min  Intake/output summary:   Intake/Output Summary (Last 24 hours) at 01/21/2018 0856 Last data filed at 01/21/2018 0851 Gross per 24 hour  Intake 584.71 ml  Output 2000 ml  Net -1415.29 ml   LBM: Last BM Date: 01/19/18 Baseline Weight: Weight: 95.7 kg Most recent weight: Weight: 109.8 kg       Palliative Assessment/Data: 20%     Patient Active Problem List   Diagnosis Date Noted  . Hyperkalemia   . Epigastric abdominal pain   . Palliative care encounter   . Gastrointestinal hemorrhage   . AKI (acute kidney injury) ([place]) 01/17/2018  . Acute on chronic combined systolic and diastolic CHF (congestive heart failure) ([place]) 12/22/2017  . Coagulopathy ([place])   . Bacteremia due to Streptococcus ([place])    . Clostridium difficile colitis   . GERD (gastroesophageal reflux disease) 12/04/2017  . DVT (deep venous thrombosis) ([place]) 12/04/2017  . PE (pulmonary thromboembolism) ([place]) 12/04/2017  . Pleural effusion 12/04/2017  . Acute pulmonary embolism without acute cor pulmonale (HCC)   . Ascites 09/29/2017  . Chronic diarrhea 09/04/2017  . Long term current use of anticoagulant - apixaban 09/04/2017  . Fatigue 05/28/2017  . Chronic combined systolic and diastolic CHF (congestive heart failure) ([place])   . Dilated cardiomyopathy  ([place])   . Contraindication to anticoagulation therapy   . Bacterial cystitis 04/11/2017  . Reactive airway disease, mild intermittent, uncomplicated 14/43/1540  . Obstructive sleep apnea 01/08/2015  . Essential hypertension 12/21/2014  . Prediabetes 12/21/2014  . Hypothyroidism 12/21/2014  . Encounter for Medicare annual wellness exam 12/20/2014  . Medication management 09/12/2014  . Vitamin D deficiency 09/12/2014  . Atrial fibrillation with RVR ([place]) 05/11/2014  . 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 Left sided sciatica 02/09/2014  . History of colonic polyps 03/25/2012  . Obesity 09/27/2009  . History of small bowel obstruction 12/31/2007  . Hepatic cirrhosis ([place]) 12/31/2007  . PORTAL HYPERTENSION 12/31/2007    Palliative Care Plan    Recommendations/Plan:  Continue cardizem, lasix, protonix in addition to fentanyl, ativan, and robinul for comfort.    Antibiotics discontinued.  Patient does not want to prolong her life in this condition.  Will decrease protonix as patient is no longer GI bleeding.  Will decrease robinul slightly as patient mentions dry mouth.  [facility]ing for a bed at [facility].  Patient requests comfort measures only.  Do not escalate care or increase oxygen level.  Goals of Care and Additional Recommendations:  Limitations on Scope of Treatment: Full Comfort Care  Code Status:  DNR  Prognosis:   Hours - Days patient with liver and kidney failure.  Significantly volume overloaded.    Discharge Planning:  Hospice facility  Care plan was discussed with patient and family, bedside RN.  Thank you for allowing the Palliative Medicine Team to assist in the care of this patient.  Total time spent:  35 min.     Greater than 50%  of this time was spent counseling and coordinating care related to the above assessment and plan.  Florentina Jenny, PA-C Palliative Medicine  Please contact Palliative MedicineTeam phone at [phone] for  questions and concerns between 7 am - 7 pm.   Please see AMION for individual provider pager numbers.

## 2018-01-21 NOTE — Progress Notes (Signed)
[facility]  Received request from [place] for family interest in [facility]. Chart reviewed and met with patient, brother, S-I-L and nephew at bedside. Explained services, confirmed interest. Patient very familiar with [facility] and hospice services from past experiences. Patient, family and CSW aware [place] does not have room to offer 01/20/18. Eligibility has been confirmed. [facility] hospital team will follow up with CSW and/or family regarding availability.   Thank you,  Erling Conte, [place]

## 2018-01-21 NOTE — Progress Notes (Addendum)
Clinical Social Worker facilitated patient discharge including contacting patient family and facility to confirm patient discharge plans.  Clinical information faxed to facility and family agreeable with plan.  CSW arranged ambulance transport via [facility] to [facility] .[facility] pick up is set for 3:15pm.   RN to [phone] for call report prior to discharge.  Clinical Social Worker will sign off for now as social work intervention is no longer needed. Please consult [place] again if new need arises.  Rhea Pink, MSW, [place]

## 2018-01-22 ENCOUNTER — Encounter: Payer: Self-pay | Admitting: *Deleted

## 2018-01-22 ENCOUNTER — Other Ambulatory Visit: Payer: Self-pay | Admitting: *Deleted

## 2018-01-22 LAB — CULTURE, BLOOD (ROUTINE X 2): Culture: NO GROWTH

## 2018-01-22 NOTE — Patient Outreach (Signed)
[facility]) Care Management  01/22/2018  Brittney Valentine [birth year] [id number]   CSW aware of pt's transfer to [facility] for end of life care. CSW will close [facility] referral and advise [facility] team and PCP.   Eduard Clos, MSW, [place] Worker  [place] [phone]

## 2018-02-07 DEATH — deceased

## 2018-02-11 ENCOUNTER — Ambulatory Visit (HOSPITAL_COMMUNITY): Payer: PPO | Admitting: Nurse Practitioner

## 2018-02-17 ENCOUNTER — Encounter: Payer: Self-pay | Admitting: Physician Assistant

## 2018-04-07 ENCOUNTER — Encounter: Payer: Self-pay | Admitting: Physician Assistant

## 2019-03-21 IMAGING — DX DG CHEST 2V
2 series · 2 of 2 positions shown · non-contrast
Comparison: CT chest 12/04/2017. The single view of the chest
04/14/2017.

CLINICAL DATA: Hypotension today.

EXAM:
CHEST - 2 VIEW

[chest lat]
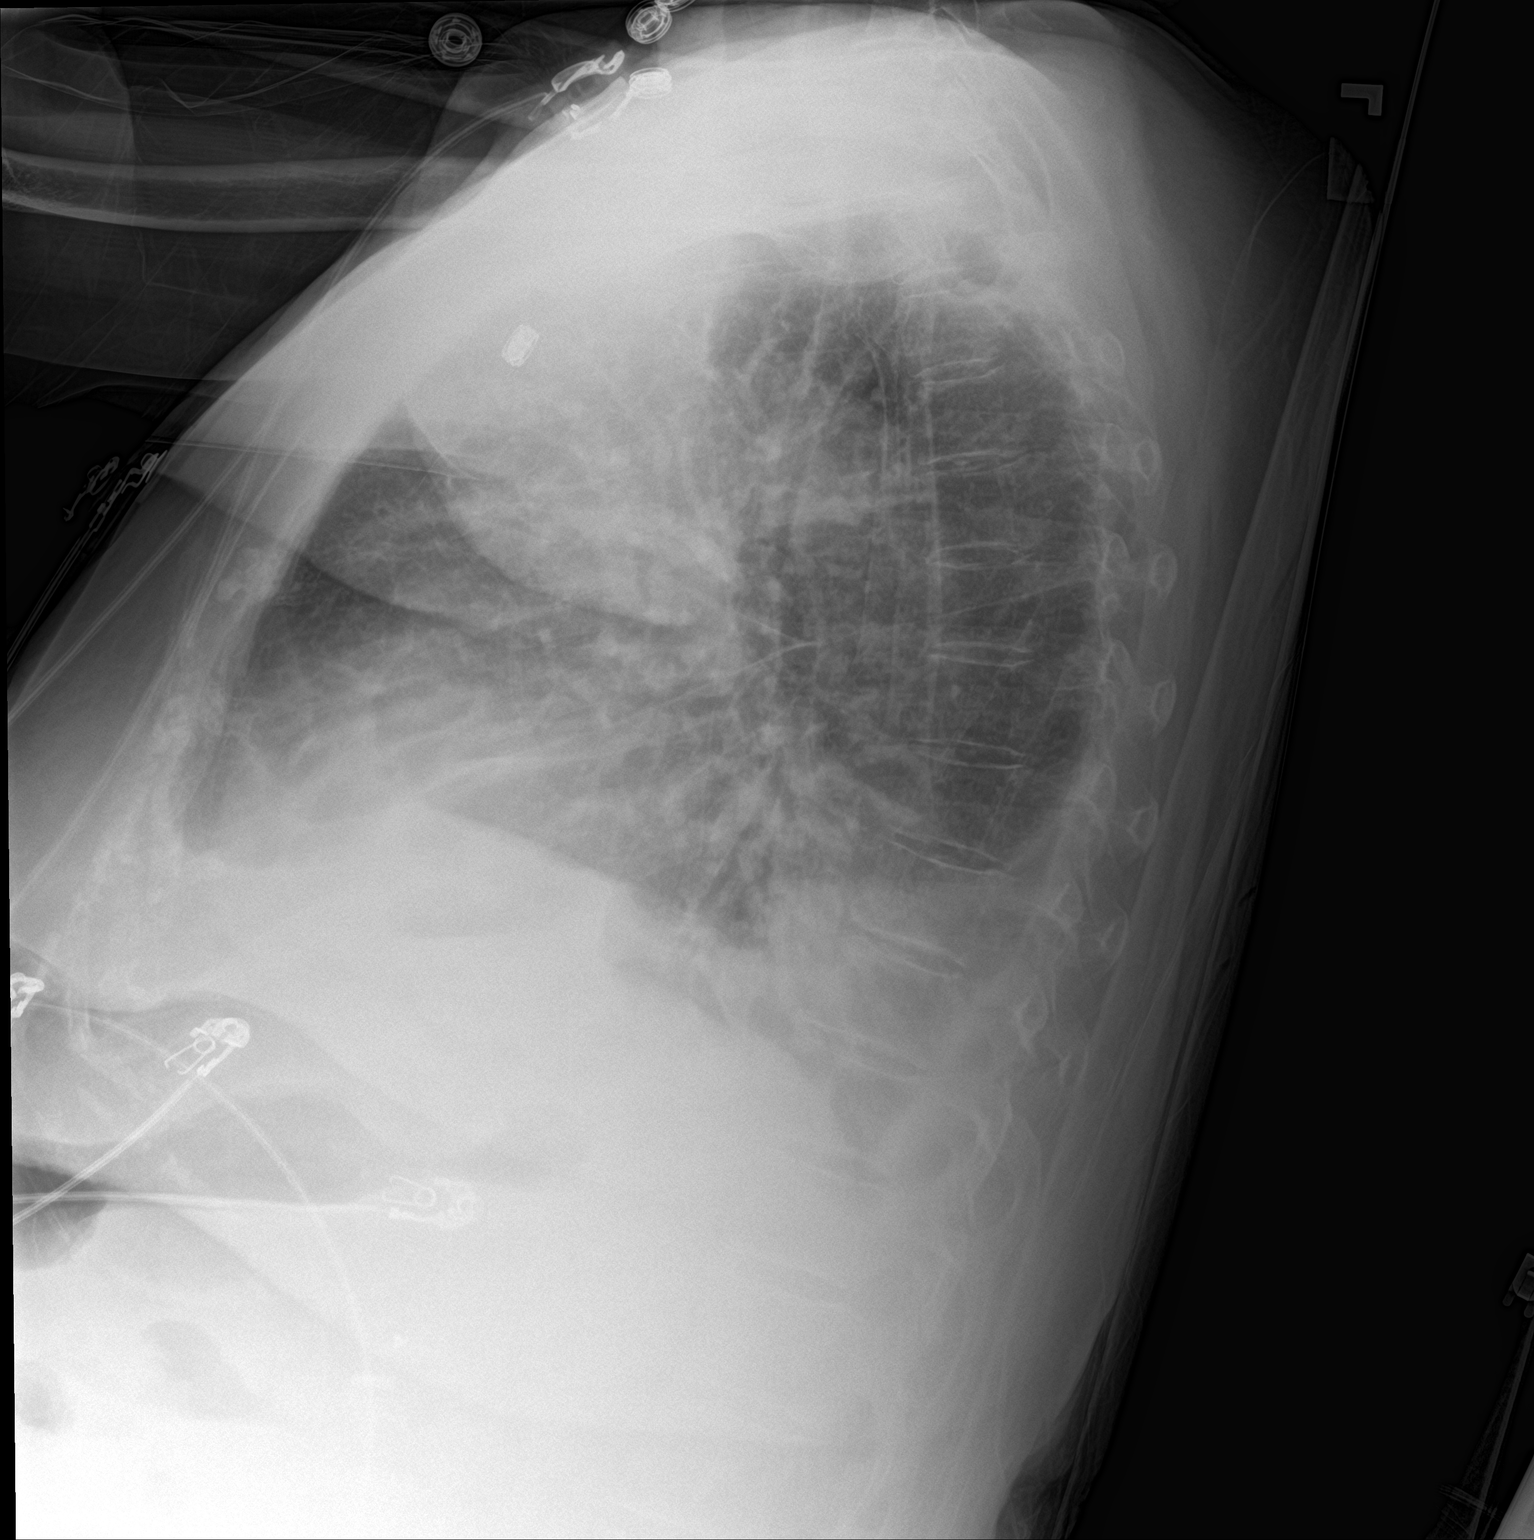

[chest ap]
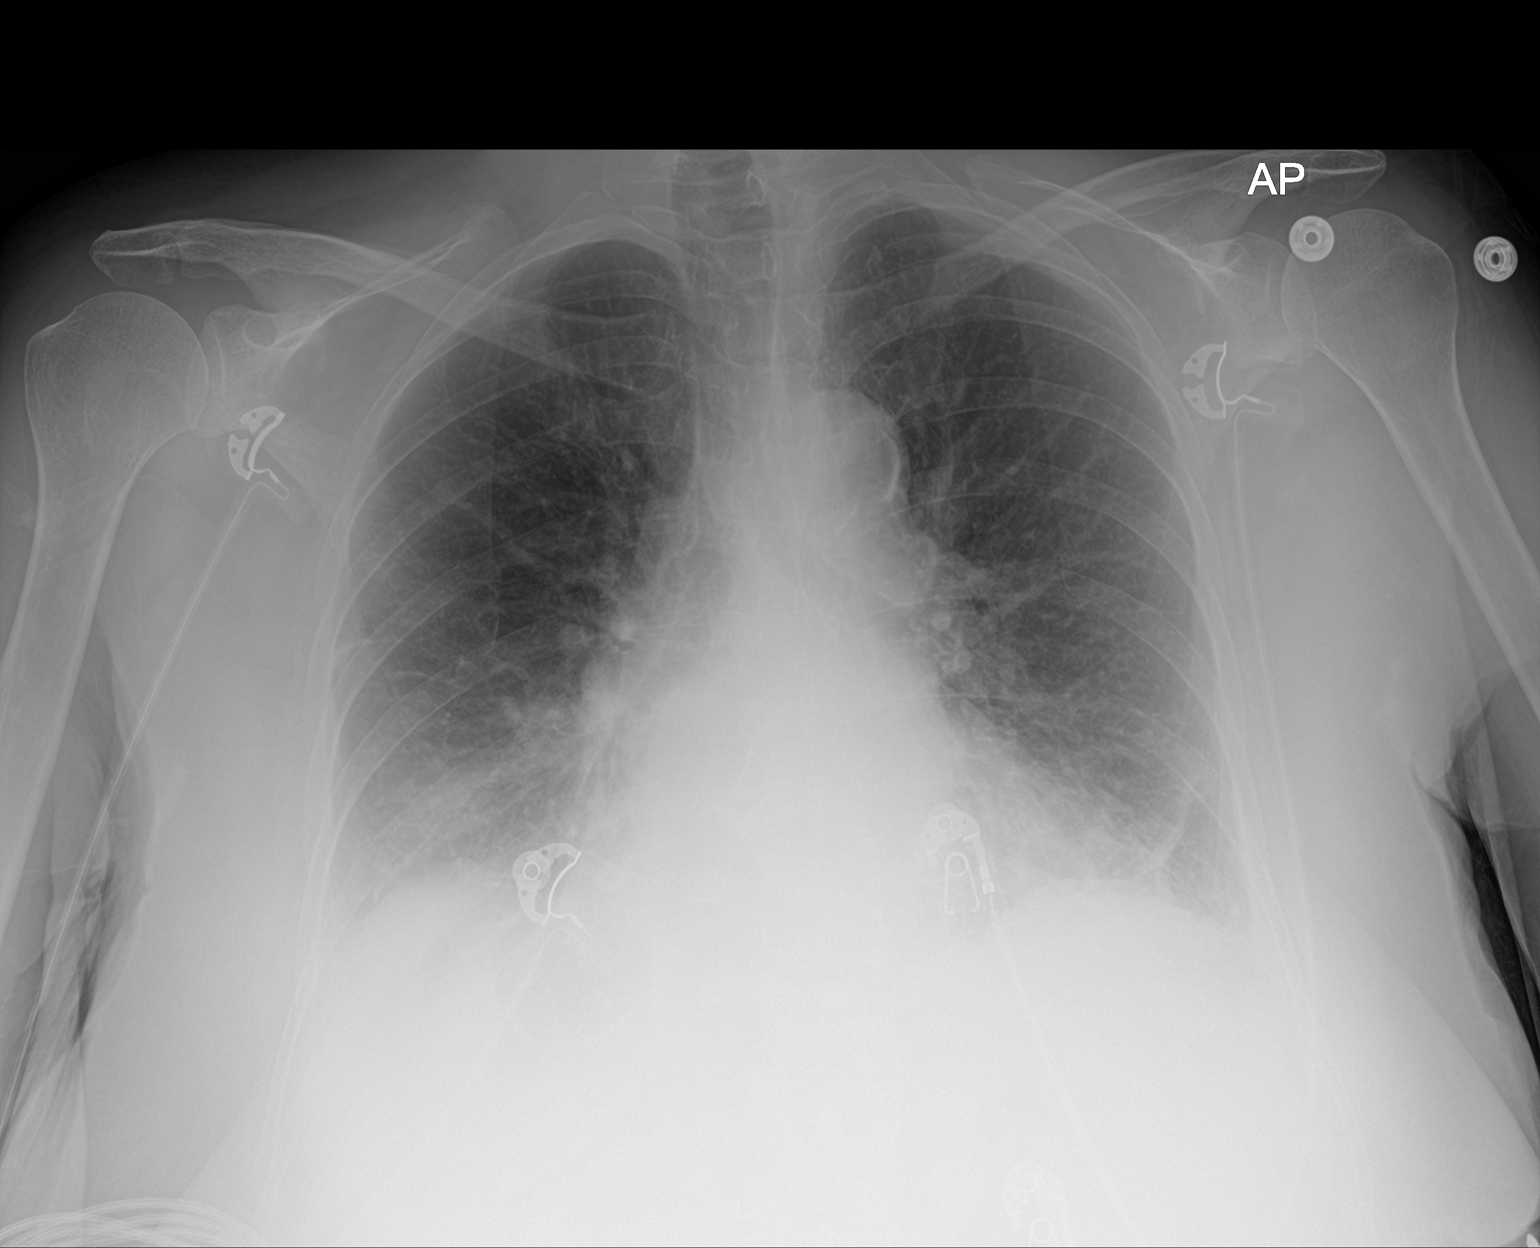

[2 of 2 positions shown; findings below may reference images not displayed]

FINDINGS: There small to moderate bilateral pleural effusions and basilar
atelectasis. Cardiomegaly without edema is identified. No
pneumothorax. Aortic atherosclerosis is seen. No acute bony
abnormality.
IMPRESSION: Small to moderate bilateral pleural effusions and basilar
atelectasis, greater on the right.

Cardiomegaly without edema.

Atherosclerosis.

## 2019-03-21 IMAGING — CT CT RENAL STONE PROTOCOL
2 of 4 series · 16 of 46 positions shown, 18 images · non-contrast
Comparison: 09/09/2017

CLINICAL DATA: Nausea, vomiting, weakness, history cirrhosis, GERD,
kidney stones, hypertension

EXAM:
CT ABDOMEN AND PELVIS WITHOUT CONTRAST
TECHNIQUE: Multidetector CT imaging of the abdomen and pelvis was performed
following the standard protocol without IV contrast. Sagittal and
coronal MPR images reconstructed from axial data set. Oral contrast
was not administered.

[Series 3: renal stone 5.0 · axial · 0.78mm/px · z∈[+666,+1116]mm · 13 of 98 slices shown, 15 images]
[im 4/98  soft-tissue]
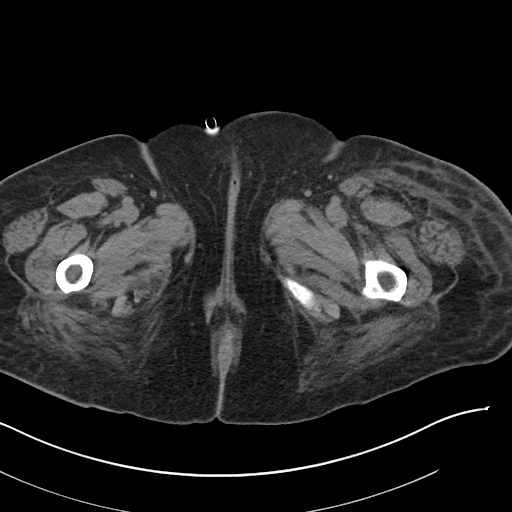
[im 4/98  bone]
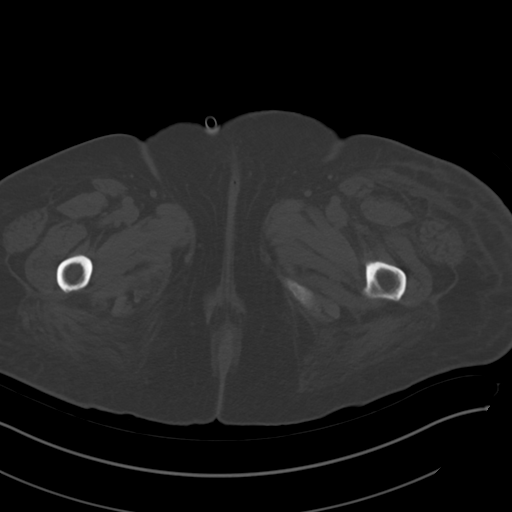
[im 12/98  soft-tissue]
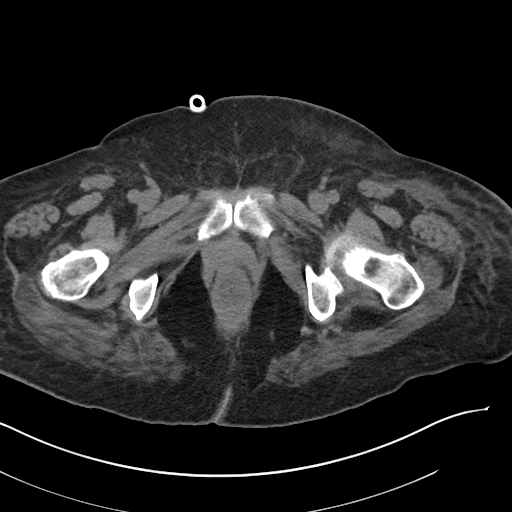
[im 19/98  soft-tissue]
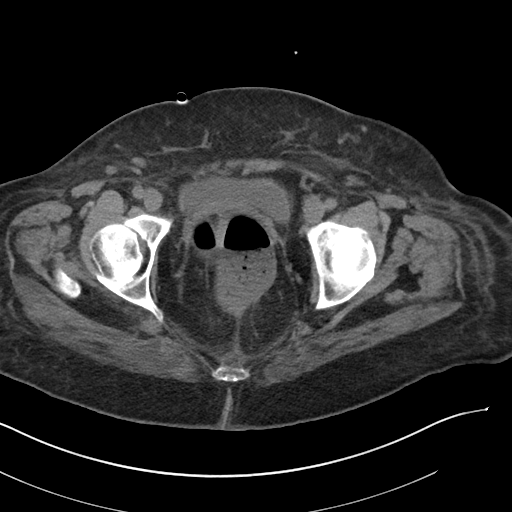
[im 27/98  soft-tissue]
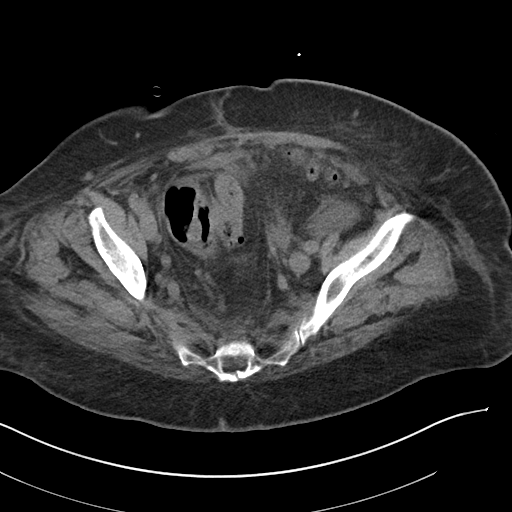
[im 34/98  soft-tissue]
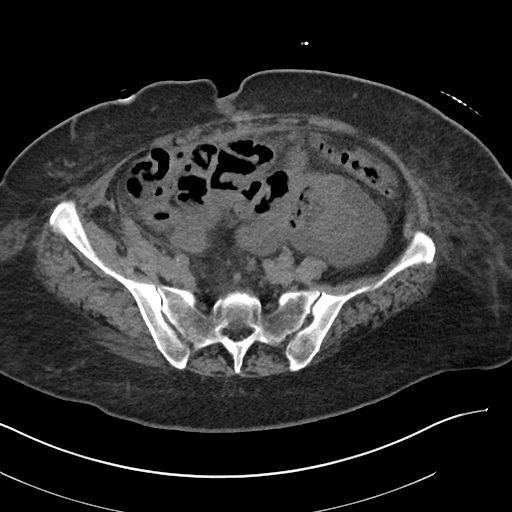
[im 42/98  soft-tissue]
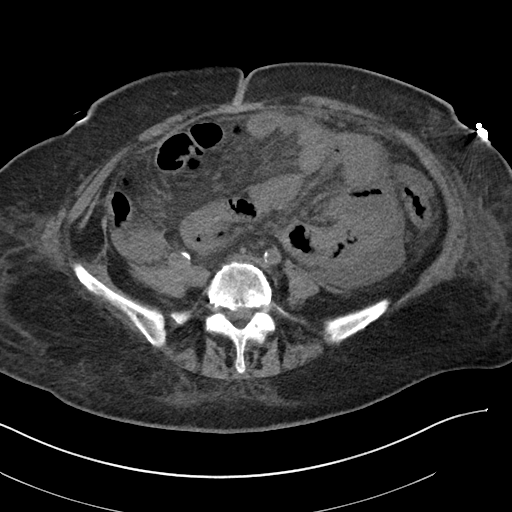
[im 49/98  soft-tissue]
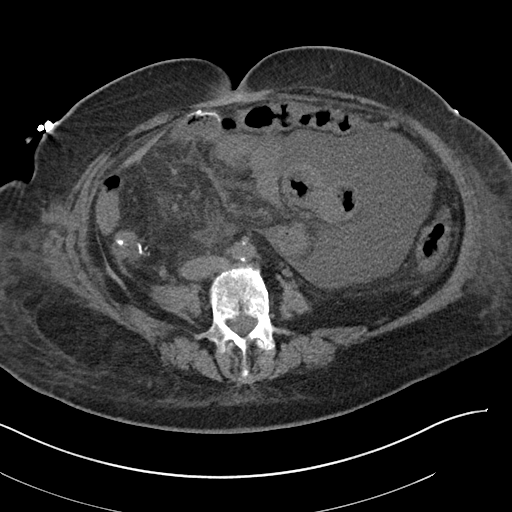
[im 56/98  soft-tissue]
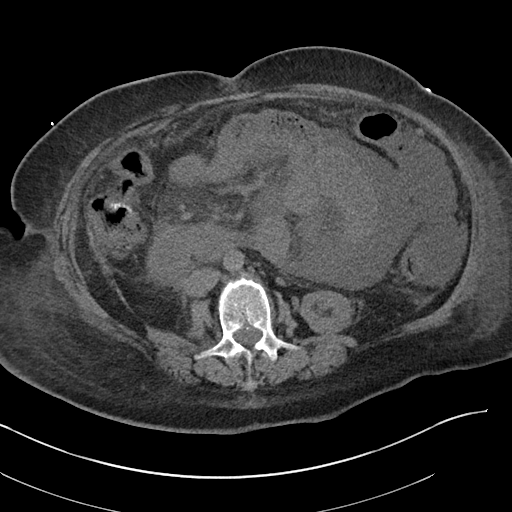
[im 64/98  soft-tissue]
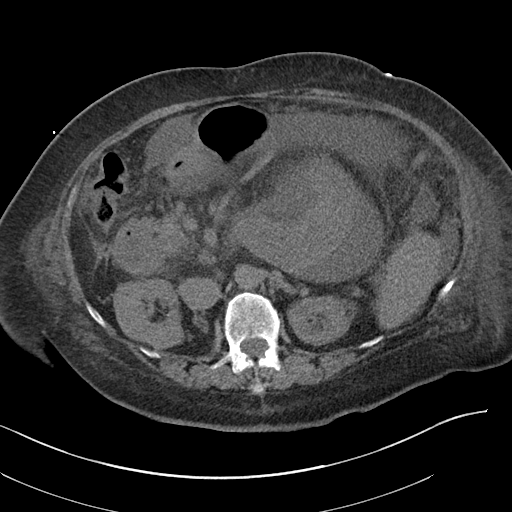
[im 64/98  bone]
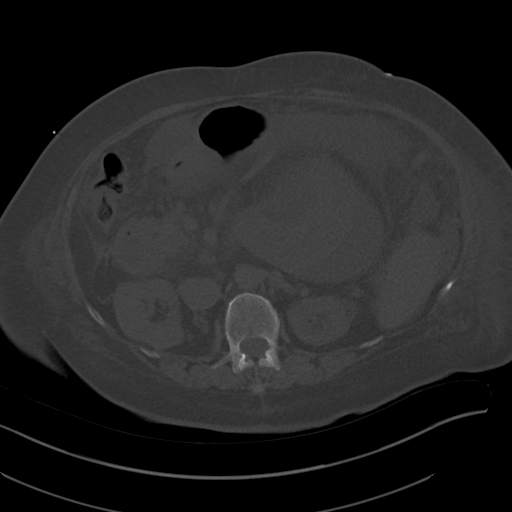
[im 71/98  soft-tissue]
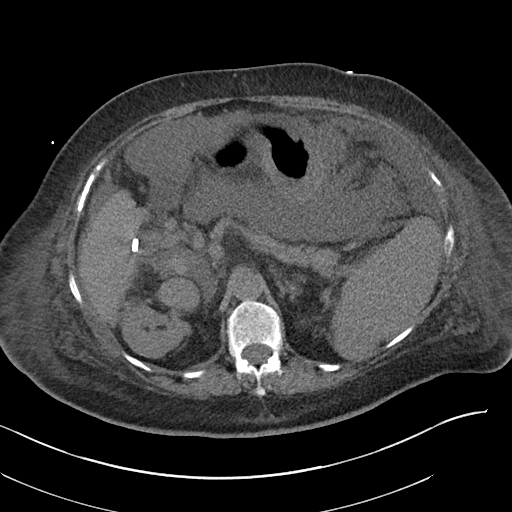
[im 79/98  soft-tissue]
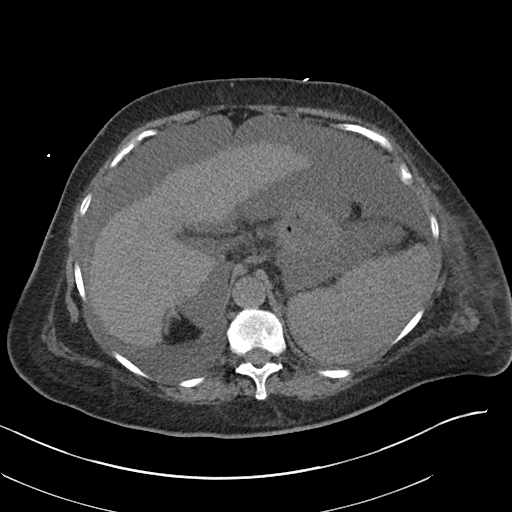
[im 86/98  soft-tissue]
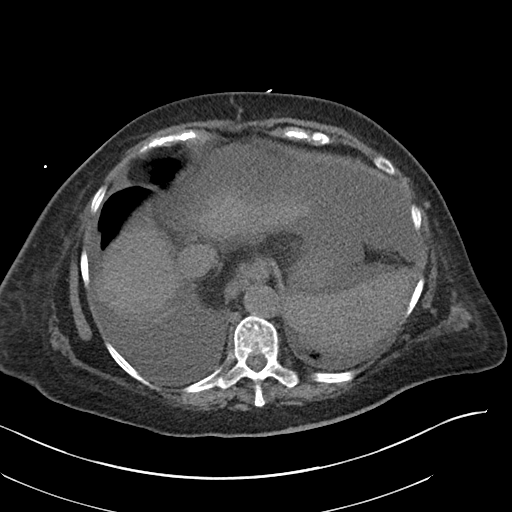
[im 94/98  soft-tissue]
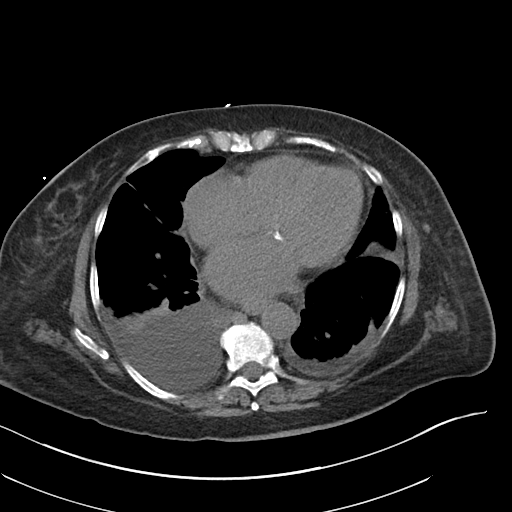

[Series 6: renal stone 3.0 cor · coronal · 0.88mm/px · 3 of 99 slices shown]
[im 33/99  soft-tissue]
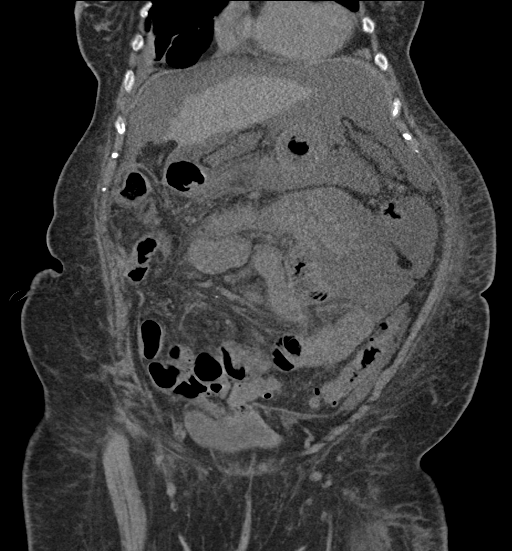
[im 44/99  soft-tissue]
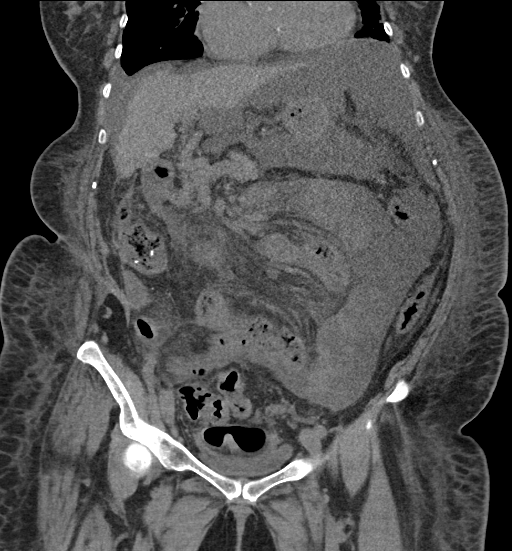
[im 55/99  soft-tissue]
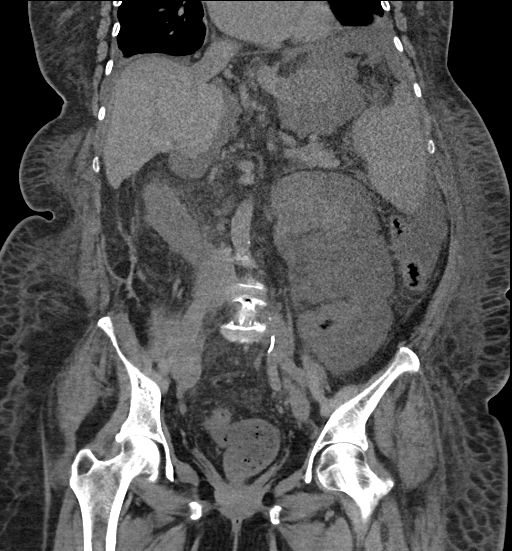

[16 of 46 positions shown; findings below may reference images not displayed]

FINDINGS: Lower chest: Biatrial enlargement. Bibasilar pleural effusions
moderate RIGHT and small LEFT. Bibasilar atelectasis.

Hepatobiliary: Small cirrhotic liver. Post cholecystectomy.
Low-attenuation lesion with peripheral calcification lateral segment
LEFT lobe liver 2.0 x 1.9 cm not significantly changed.

Pancreas: Atrophic pancreas without mass

Spleen: Minimally enlarged, 15.8 x 12.9 x 6.8 cm (volume = 730
cm^3). No focal lesion.

Adrenals/Urinary Tract: Unremarkable adrenal glands. Atrophic
kidneys with minimal chronic nodularity. Persistent mid LEFT
ureteral calculus 7 mm diameter. Bladder decompressed. No additional
urinary tract calcification.

Stomach/Bowel: Prior bowel resection with anastomotic staple line in
RIGHT abdomen likely ileocecectomy. Mild diffuse colonic wall
thickening versus artifact from underdistention. Additional staple
line in the RIGHT upper quadrant at a small bowel loop. This loop
demonstrates diffuse bowel wall thickening and is immediately caudal
to the transverse colon. Multiple additional small bowel loops
appear dilated and demonstrate diffuse wall thickening. Overall
appearance is nonspecific. This could reflect diffuse enteritis from
infection or inflammatory bowel disease, portal enteropathy related
to cirrhosis, ischemia not excluded though this appears to be a
chronic finding since the prior study making ischemia less likely.

Vascular/Lymphatic: Scattered atherosclerotic calcifications aorta
and iliac arteries. Aorta normal caliber.

Reproductive: Uterus surgically absent. Nonvisualization of ovaries.

Other: Significant ascites unchanged.  No free air.  No hernia.

Musculoskeletal: Osseous demineralization.
IMPRESSION: Cirrhotic liver with significant ascites and splenomegaly.

Diffuse bowel wall thickening of large and small bowel loops
throughout abdomen, present since 09/09/2017; differential diagnosis
includes enteritis such as from infection or inflammatory bowel
disease, portal enteropathy in the setting of cirrhosis, ischemia
not excluded but considered less likely due to chronicity.

Bibasilar effusions and atelectasis greater on RIGHT.

Chronic mid LEFT ureteral calculus without
hydronephrosis/hydroureter.
